# Patient Record
Sex: Male | Born: 1935
Health system: Southern US, Community
[De-identification: ages and names within clinical notes are randomized; demographics above are authoritative.]

## PROBLEM LIST (undated history)

## (undated) DIAGNOSIS — R079 Chest pain, unspecified: Secondary | ICD-10-CM

## (undated) DIAGNOSIS — E785 Hyperlipidemia, unspecified: Secondary | ICD-10-CM

## (undated) DIAGNOSIS — N529 Male erectile dysfunction, unspecified: Secondary | ICD-10-CM

## (undated) DIAGNOSIS — N401 Enlarged prostate with lower urinary tract symptoms: Secondary | ICD-10-CM

## (undated) DIAGNOSIS — E1129 Type 2 diabetes mellitus with other diabetic kidney complication: Secondary | ICD-10-CM

## (undated) DIAGNOSIS — N183 Chronic kidney disease, stage 3 unspecified: Secondary | ICD-10-CM

## (undated) DIAGNOSIS — R972 Elevated prostate specific antigen [PSA]: Secondary | ICD-10-CM

## (undated) DIAGNOSIS — N138 Other obstructive and reflux uropathy: Secondary | ICD-10-CM

## (undated) DIAGNOSIS — N200 Calculus of kidney: Secondary | ICD-10-CM

## (undated) DIAGNOSIS — I10 Essential (primary) hypertension: Secondary | ICD-10-CM

## (undated) DIAGNOSIS — R809 Proteinuria, unspecified: Secondary | ICD-10-CM

## (undated) DIAGNOSIS — H409 Unspecified glaucoma: Secondary | ICD-10-CM

## (undated) DIAGNOSIS — E669 Obesity, unspecified: Secondary | ICD-10-CM

## (undated) DIAGNOSIS — I495 Sick sinus syndrome: Secondary | ICD-10-CM

## (undated) DIAGNOSIS — N400 Enlarged prostate without lower urinary tract symptoms: Secondary | ICD-10-CM

## (undated) DIAGNOSIS — M1990 Unspecified osteoarthritis, unspecified site: Secondary | ICD-10-CM

## (undated) HISTORY — DX: Benign prostatic hyperplasia without lower urinary tract symptoms: N40.0

## (undated) HISTORY — DX: Unspecified glaucoma: H40.9

## (undated) HISTORY — DX: Type 2 diabetes mellitus with other diabetic kidney complication: E11.29

## (undated) HISTORY — DX: Other obstructive and reflux uropathy: N13.8

## (undated) HISTORY — DX: Elevated prostate specific antigen (PSA): R97.20

## (undated) HISTORY — DX: Male erectile dysfunction, unspecified: N52.9

## (undated) HISTORY — DX: Chronic kidney disease, stage 3 (moderate): N18.3

## (undated) HISTORY — PX: KNEE SURGERY: SHX244

## (undated) HISTORY — DX: Hyperlipidemia, unspecified: E78.5

## (undated) HISTORY — DX: Essential (primary) hypertension: I10

## (undated) HISTORY — DX: Proteinuria, unspecified: R80.9

## (undated) HISTORY — DX: Obesity, unspecified: E66.9

## (undated) HISTORY — DX: Unspecified osteoarthritis, unspecified site: M19.90

## (undated) HISTORY — DX: Calculus of kidney: N20.0

## (undated) HISTORY — DX: Benign prostatic hyperplasia with lower urinary tract symptoms: N40.1

## (undated) HISTORY — DX: Sick sinus syndrome: I49.5

## (undated) HISTORY — DX: Chest pain, unspecified: R07.9

## (undated) HISTORY — DX: Chronic kidney disease, stage 3 unspecified: N18.30

---

## 1998-06-24 ENCOUNTER — Other Ambulatory Visit: Admission: RE | Admit: 1998-06-24 | Discharge: 1998-06-24 | Payer: Self-pay | Admitting: Gastroenterology

## 1998-11-27 ENCOUNTER — Other Ambulatory Visit: Admission: RE | Admit: 1998-11-27 | Discharge: 1998-11-27 | Payer: Self-pay | Admitting: Urology

## 1999-09-13 ENCOUNTER — Encounter: Payer: Self-pay | Admitting: *Deleted

## 1999-09-13 ENCOUNTER — Encounter: Admission: RE | Admit: 1999-09-13 | Discharge: 1999-09-13 | Payer: Self-pay | Admitting: *Deleted

## 1999-09-15 ENCOUNTER — Ambulatory Visit (HOSPITAL_BASED_OUTPATIENT_CLINIC_OR_DEPARTMENT_OTHER): Admission: RE | Admit: 1999-09-15 | Discharge: 1999-09-15 | Payer: Self-pay | Admitting: *Deleted

## 2000-01-02 ENCOUNTER — Emergency Department (HOSPITAL_COMMUNITY): Admission: EM | Admit: 2000-01-02 | Discharge: 2000-01-02 | Payer: Self-pay | Admitting: Emergency Medicine

## 2000-01-02 ENCOUNTER — Encounter: Payer: Self-pay | Admitting: *Deleted

## 2000-01-05 ENCOUNTER — Ambulatory Visit (HOSPITAL_COMMUNITY): Admission: RE | Admit: 2000-01-05 | Discharge: 2000-01-05 | Payer: Self-pay | Admitting: *Deleted

## 2000-07-07 ENCOUNTER — Other Ambulatory Visit: Admission: RE | Admit: 2000-07-07 | Discharge: 2000-07-07 | Payer: Self-pay | Admitting: Urology

## 2001-02-21 ENCOUNTER — Encounter: Payer: Self-pay | Admitting: *Deleted

## 2001-02-26 ENCOUNTER — Inpatient Hospital Stay (HOSPITAL_COMMUNITY): Admission: RE | Admit: 2001-02-26 | Discharge: 2001-03-04 | Payer: Self-pay | Admitting: *Deleted

## 2001-02-26 HISTORY — PX: REPLACEMENT TOTAL KNEE: SUR1224

## 2001-03-23 ENCOUNTER — Encounter: Admission: RE | Admit: 2001-03-23 | Discharge: 2001-05-11 | Payer: Self-pay | Admitting: *Deleted

## 2001-08-28 ENCOUNTER — Encounter: Admission: RE | Admit: 2001-08-28 | Discharge: 2001-09-26 | Payer: Self-pay | Admitting: *Deleted

## 2002-12-05 ENCOUNTER — Encounter: Payer: Self-pay | Admitting: Internal Medicine

## 2002-12-05 ENCOUNTER — Encounter: Admission: RE | Admit: 2002-12-05 | Discharge: 2002-12-05 | Payer: Self-pay | Admitting: Internal Medicine

## 2002-12-18 ENCOUNTER — Encounter: Payer: Self-pay | Admitting: Gastroenterology

## 2005-02-05 ENCOUNTER — Emergency Department (HOSPITAL_COMMUNITY): Admission: EM | Admit: 2005-02-05 | Discharge: 2005-02-05 | Payer: Self-pay | Admitting: Emergency Medicine

## 2006-03-19 ENCOUNTER — Emergency Department (HOSPITAL_COMMUNITY): Admission: EM | Admit: 2006-03-19 | Discharge: 2006-03-19 | Payer: Self-pay | Admitting: *Deleted

## 2006-12-22 ENCOUNTER — Ambulatory Visit: Payer: Self-pay | Admitting: Gastroenterology

## 2007-01-08 ENCOUNTER — Ambulatory Visit: Payer: Self-pay | Admitting: Gastroenterology

## 2007-10-23 ENCOUNTER — Ambulatory Visit: Payer: Self-pay | Admitting: Gastroenterology

## 2008-04-18 DIAGNOSIS — K219 Gastro-esophageal reflux disease without esophagitis: Secondary | ICD-10-CM | POA: Insufficient documentation

## 2008-04-18 DIAGNOSIS — I10 Essential (primary) hypertension: Secondary | ICD-10-CM

## 2008-04-18 DIAGNOSIS — Z860100 Personal history of colon polyps, unspecified: Secondary | ICD-10-CM | POA: Insufficient documentation

## 2008-04-18 DIAGNOSIS — E785 Hyperlipidemia, unspecified: Secondary | ICD-10-CM | POA: Insufficient documentation

## 2008-04-18 DIAGNOSIS — K5730 Diverticulosis of large intestine without perforation or abscess without bleeding: Secondary | ICD-10-CM | POA: Insufficient documentation

## 2008-04-18 DIAGNOSIS — K449 Diaphragmatic hernia without obstruction or gangrene: Secondary | ICD-10-CM | POA: Insufficient documentation

## 2008-04-18 DIAGNOSIS — K222 Esophageal obstruction: Secondary | ICD-10-CM

## 2008-04-22 ENCOUNTER — Ambulatory Visit: Payer: Self-pay | Admitting: Gastroenterology

## 2008-04-22 DIAGNOSIS — R1319 Other dysphagia: Secondary | ICD-10-CM

## 2008-04-22 DIAGNOSIS — K625 Hemorrhage of anus and rectum: Secondary | ICD-10-CM

## 2008-04-24 ENCOUNTER — Ambulatory Visit (HOSPITAL_COMMUNITY): Admission: RE | Admit: 2008-04-24 | Discharge: 2008-04-24 | Payer: Self-pay | Admitting: Gastroenterology

## 2008-05-01 ENCOUNTER — Telehealth: Payer: Self-pay | Admitting: Gastroenterology

## 2008-05-22 ENCOUNTER — Ambulatory Visit: Payer: Self-pay | Admitting: Gastroenterology

## 2008-05-22 ENCOUNTER — Ambulatory Visit (HOSPITAL_COMMUNITY): Admission: RE | Admit: 2008-05-22 | Discharge: 2008-05-22 | Payer: Self-pay | Admitting: Gastroenterology

## 2008-06-09 ENCOUNTER — Telehealth: Payer: Self-pay | Admitting: Gastroenterology

## 2009-05-21 ENCOUNTER — Telehealth: Payer: Self-pay | Admitting: Gastroenterology

## 2011-03-15 NOTE — Assessment & Plan Note (Signed)
[facility]                         GASTROENTEROLOGY OFFICE NOTE   NAME:[name], [name] KEMMERLING                   MRN:          [id number]  DATE:10/23/2007                            DOB:          03-20-1925    [name] [name] has had some hard stools after stopping probiotic therapy.  He has had a tear in his rectum partially responsive to local anal  salves.  He has had some intermittent bright red blood per rectum.  Denies abdominal pain what so ever.  He does have a family history of  colon carcinoma in a sibling and also has had previous polyps.  However,  he had a normal colonoscopy in March of 2008 except for diverticulosis.   [name] [name] also has documented esophageal spasm, and has intermittent  dysphagia mostly for liquids.  He denies reflux symptoms.  Does not take  antacids, but uses occasional vinegar.  He is followed by Dr. Vonita Moss  because of recurrent kidney stones, and sees Dr. Wylene Simmer for treatment  of his hypertensive cardiovascular disease.  He has had previous hernia  surgery and subsequent hypercholesterolemia.   MEDICATIONS:  1. Uroxatral 10 mg a day.  2. Hyzaar 20-100 a day.  3. Allopurinol 100 mg a day.  4. Simvastatin 80 mg a day.  5. A variety of multivitamins.   He denies drug allergies.   EXAMINATION:  He is a healthy-appearing white male appearing younger  than his stated age.  He is 5 feet 9 inches and weighs 138 pounds.  Blood pressure 132/80 and  pulse was 68 and regular.  ABDOMEN:  Obese, but I could appreciate no definite organomegaly,  masses, or tenderness.  Inspection of the rectum also was unremarkable.  I could not appreciate  a definite fissure.  Rectal exam showed no masses or tenderness and his  rectum was not particularly tender.  Soft stool present.  It was trace  guaiac positive.   ASSESSMENT:  1. I think [name] [name] most likely has a healing anal fissure.  As      mentioned above, he has had recent  colonoscopy that was      unremarkable.  2. Recurrent esophageal spasm without associated weight loss or other      systemic complaints.  3. Hypertensive cardiovascular disease.  4. History of hyperuricemia, recurrent uric acid stones.   RECOMMENDATIONS:  1. High fiber diet with daily Benefiber and liberal p.o. fluids.  2. Canasa 1 g suppositories at bedtime with twice a day Analpram      cream.  3. Continue other medications as per Dr. Wylene Simmer.     Vania Rea. Jarold Motto, MD, Caleen Essex, FAGA  Electronically Signed    DRP/MedQ  DD: 10/23/2007  DT: 10/23/2007  Job #: [id number]

## 2011-03-18 NOTE — Op Note (Signed)
[place]. [facility]  Patient:    [name] [name], [name] [name]                   MRN: [id number] Proc. Date: 02/26/01 Adm. Date:  [id number] Attending:  Maryanna Shape                           Operative Report  PREOPERATIVE DIAGNOSIS:  Left knee osteoarthritis.  POSTOPERATIVE DIAGNOSIS:  Left knee osteoarthritis.  OPERATIVE PROCEDURE:  Left total knee arthroplasty.  ANESTHESIA:  General endotracheal.  SURGEON:  Reynolds Bowl, M.D.  ASSISTANT:  Dorene Grebe, M.D.  DESCRIPTION OF PROCEDURE:  The patient was given a general anesthetic. Vancomycin 750 mg was given IV. A Foley catheter was put in place and with the patient in the supine position, the groin area was isolated with a U-drape and and a proximal pneumatic tourniquet placed. It was then prepped with DuraPrep and draped in the usual manner. This included the use of two Ioban drapes. The extremity was exsanguinated with an Esmarch bandage and elevation of the proximal pneumatic elevated to 350 mmHg.  A straight midline incision was made starting proximally and carried to the mid patella and gradually carried over to the medial side of the patellar tendon. The retinaculum was opened medially. The patella dislocated laterally. The medial side of the knee was exposed subperiosteally back to the back corner. The lateral side of the knee was exposed to show its edges by removing a small amount of the capsule. The distal femur was resected 12 mm and 5 degrees of valgus using an intramedullary guide. The distal femur was shaped using the chamfer cut guide and then the knee prepared for the Scorpio box using the cutting block and impactor. Using the sizing guide, we had decided on size 9 and used that size for our cutting blocks.  Attention was directed to the proximal tibia which was resected 10 mm from the lateral side using an intramedullary guide. At this point trial femoral/tibial components and tibial  tray bearing, 10 mm thick were tried. We decided on, as noted above, a size 9 femur, size 9 tibial tray and 10 mm plastic tibial tray. This allowed full flexion, full extension. We did a little bit of release posteriorly and it tracked well. We then marked the rotation and then used power guide to impact and make the bed for the tibial tray thin.  Attention was then directed to the patella which was resected 10 mm and then I placed peg holes to utilize a 7 dome patella. At this point we then checked on our overall alignment again using our alignment rods and felt that was good. Then with the components in place, flexion and extension and varus/valgus stress, felt that we had excellent tibial and femoral fit, but the lateral retinaculum was a little tight. The patella would seat, but would tilt. Therefore, we accomplished a lateral release and then it would flex and extend and not tilt. Following this, the entire area was copiously irrigated with pulsatile lavage. We then cemented the tibial component, followed by the femoral component and then cemented the patellar button. Each time each element was well seated and excess cement removed. Having done this, I put the tibial bearing tray in place and we waited until all the cement had hardened.  Having accomplished this, the area was irrigated, the proximal pneumatic tourniquet was let down. There were a few bleeders  which were Bovie cauterized and the closed the retinaculum using figure-of-eight sutures of #1 Vicryl, closing the more superficial tissues using 2-0 Vicryl and the skin edges with metal staples. One suction Hemovac was placed just superficial to the retinaculum. The skin was dressed with Xeroform, 4 x 4s, ABDs, Kerlix, light application of Ace wrap and placed in a knee immobilizer. Following this, the anesthesiologist did a femoral and lateral femoral cutaneous nerve block. The estimated blood loss during this procedure was  about 250 cc. Intermittently, the joint was irrigated with pulsatile lavage. There were no apparent unresolved problems. DD:  02/26/01 TD:  02/26/01 Job: [id number]

## 2011-03-18 NOTE — H&P (Signed)
[place]. [facility]  Patient:    [name] [name], [name] [name]                     MRN: [id number] Adm. Date:  02/21/01 Attending:  Reynolds Bowl, M.D.                         History and Physical  HISTORY OF PRESENT ILLNESS:  Mr. Isadore is a 77 year old man with complaint of continuous pain in the left knee getting so bad at times he can hardly walk.  I did arthroscopy on this same knee in November of 2000.  He had general arthrosis, more medial than lateral.  Subsequently the patient had been on glucosamine.  He underwent a series of _________ injections and by December of 2001, following the _________ injections, he felt almost normal.  Since then, he has gradually developed increasing pain.  He was evaluated as an outpatient and found to have significant x-ray changes, surgery, multiple complications involving limited motion, pulmonary emboli, pain, infection at the time were all discussed, and the patient elected for surgery.  PAST HISTORY:  The patient has had surgery for kidney stones in the 1970s, colonoscopy with removal of polyps in 2000, herniorrhaphy many years ago.  FAMILY HISTORY:  Positive for breast and colon cancer, as well as high blood pressure.  SOCIAL HISTORY:  The patient does not smoke cigarettes or drink ethanol.  ALLERGIES:  PENICILLIN.  He did have prior surgery and a rash under his dressing with prolonged exposure to DURAPREP.  I do not think this was an allergy.  MEDICATIONS: 1. Iron and vitamins. 2. Zocor 80 mg one fourth tablet daily. 3. Hyzaar 100-25 one daily.  PHYSICAL EXAMINATION:  VITAL SIGNS:  He is 5 feet 9 inches, 232 pounds, temperature is 97.1, blood pressure 124/82, pulse 64, respirations 12.  HEENT:  He is wearing corrective lenses.  Extraocular movements full.  Ears: Tympanic membranes are intact.  Pharynx is clear.  Teeth are well-repaired.  NECK:  No palpable cervical mass and no carotid bruits.  CHEST:   Good expansion.  Breath sounds clear.  HEART:  Regular rhythm, no murmurs heard.  ABDOMEN:  Round, soft, nontender, no palpable masses.  Bowel sounds are normal.  There is diastasis recti.  EXTREMITIES:  For the left knee, he has good femoral pulse without bruit.  He has good dorsalis pedis pulse.  He does not have leg edema.  His motion in the left knee is from 15-110 degrees.  He is stable.  He is generally sore, mildly crepitant.  X-rays compatible with tricompartment osteoarthritis.  ADMITTING DIAGNOSES: 1. Osteoarthritis of the left knee. 2. Hypertension. 3. Overweight.  PLAN:  Left total knee arthroplasty as fully discussed with the patient and his wife.  They seem to understand and agree. DD:  02/21/01 TD:  02/21/01 Job: [id number]

## 2011-03-18 NOTE — Discharge Summary (Signed)
[place]. [facility]  Patient:    [name] [name], [name] [name]                   MRN: [id number] Adm. Date:  [id number] Disc. Date: [id number] Attending:  Maryanna Shape                           Discharge Summary  ADMISSION DIAGNOSES: 1. Osteoarthritis, left knee. 2. Hypertension. 3. Overweight.  DISCHARGE DIAGNOSES: 1. Osteoarthritis, left knee. 2. Hypertension. 3. Overweight.  PROCEDURE:  February 26, 2001, left total knee arthroplasty, size #9 Scorpio PS Femoral component, #9 tibial tray, 10 mm #9 tibial bearing insert, #7 Scorpio Dome patella.  HISTORY OF PRESENT ILLNESS:  See that dictated on admission.  HOSPITAL COURSE:  On the day of admission, he was taken to the operating room where he underwent total knee arthroplasty on the left knee as detailed in the operative note.  He was then preoperatively and postoperatively antibiotics. He was begun on Coumadin early postoperatively and he was begun on mobilization early.  On April 30, nasal O2 was discharged.  Wound drain was removed.  Foley catheter was removed.  His dressing was changed and his IV was taken to [facility].  He was then begun on approximately 30% weightbearing.  He progressed well without any apparent problems and was discharged on Mar 04, 2001, to continue prophylactic Coumadin a total of four weeks.  He was to have home physical therapy.  He was to weightbear approximately 30%, do frequent quad sets and ankle pumps and on that day every other staple was removed.  He was to see me in the office in approximately a week or call sooner if concerns.  Some of the laboratory data obtained during this hospitalization, April 24, EKG normal sinus rhythm with short PR.  Otherwise normal EKG.  The patients admission hemoglobin on April 24 was 16 and his last hemoglobin was 10.5.  His INR on discharge was 1.8.  His electrolytes and routine chemistries were normal.  Admission urinalysis was normal. DD:   03/15/01 TD:  03/15/01 Job: [id number]

## 2011-11-01 DIAGNOSIS — R809 Proteinuria, unspecified: Secondary | ICD-10-CM

## 2011-11-01 HISTORY — DX: Proteinuria, unspecified: R80.9

## 2012-01-10 DIAGNOSIS — E785 Hyperlipidemia, unspecified: Secondary | ICD-10-CM | POA: Diagnosis not present

## 2012-01-10 DIAGNOSIS — I10 Essential (primary) hypertension: Secondary | ICD-10-CM | POA: Diagnosis not present

## 2012-01-10 DIAGNOSIS — E119 Type 2 diabetes mellitus without complications: Secondary | ICD-10-CM | POA: Diagnosis not present

## 2012-01-10 DIAGNOSIS — I495 Sick sinus syndrome: Secondary | ICD-10-CM | POA: Diagnosis not present

## 2012-01-17 DIAGNOSIS — H4011X0 Primary open-angle glaucoma, stage unspecified: Secondary | ICD-10-CM | POA: Diagnosis not present

## 2012-02-13 DIAGNOSIS — E119 Type 2 diabetes mellitus without complications: Secondary | ICD-10-CM | POA: Diagnosis not present

## 2012-02-13 DIAGNOSIS — E785 Hyperlipidemia, unspecified: Secondary | ICD-10-CM | POA: Diagnosis not present

## 2012-02-13 DIAGNOSIS — I10 Essential (primary) hypertension: Secondary | ICD-10-CM | POA: Diagnosis not present

## 2012-02-13 DIAGNOSIS — R0989 Other specified symptoms and signs involving the circulatory and respiratory systems: Secondary | ICD-10-CM | POA: Diagnosis not present

## 2012-02-22 DIAGNOSIS — E119 Type 2 diabetes mellitus without complications: Secondary | ICD-10-CM | POA: Diagnosis not present

## 2012-02-22 DIAGNOSIS — I495 Sick sinus syndrome: Secondary | ICD-10-CM | POA: Diagnosis not present

## 2012-02-22 DIAGNOSIS — R0989 Other specified symptoms and signs involving the circulatory and respiratory systems: Secondary | ICD-10-CM | POA: Diagnosis not present

## 2012-02-22 DIAGNOSIS — R0609 Other forms of dyspnea: Secondary | ICD-10-CM | POA: Diagnosis not present

## 2012-02-22 DIAGNOSIS — R9431 Abnormal electrocardiogram [ECG] [EKG]: Secondary | ICD-10-CM | POA: Diagnosis not present

## 2012-02-29 DIAGNOSIS — R0602 Shortness of breath: Secondary | ICD-10-CM | POA: Diagnosis not present

## 2012-02-29 DIAGNOSIS — R072 Precordial pain: Secondary | ICD-10-CM | POA: Diagnosis not present

## 2012-02-29 DIAGNOSIS — E119 Type 2 diabetes mellitus without complications: Secondary | ICD-10-CM | POA: Diagnosis not present

## 2012-02-29 DIAGNOSIS — I119 Hypertensive heart disease without heart failure: Secondary | ICD-10-CM | POA: Diagnosis not present

## 2012-03-02 DIAGNOSIS — I119 Hypertensive heart disease without heart failure: Secondary | ICD-10-CM | POA: Diagnosis not present

## 2012-03-02 DIAGNOSIS — R0602 Shortness of breath: Secondary | ICD-10-CM | POA: Diagnosis not present

## 2012-03-02 DIAGNOSIS — R079 Chest pain, unspecified: Secondary | ICD-10-CM | POA: Diagnosis not present

## 2012-03-02 DIAGNOSIS — E119 Type 2 diabetes mellitus without complications: Secondary | ICD-10-CM | POA: Diagnosis not present

## 2012-04-17 DIAGNOSIS — H4011X0 Primary open-angle glaucoma, stage unspecified: Secondary | ICD-10-CM | POA: Diagnosis not present

## 2012-05-22 DIAGNOSIS — E119 Type 2 diabetes mellitus without complications: Secondary | ICD-10-CM | POA: Diagnosis not present

## 2012-05-22 DIAGNOSIS — I10 Essential (primary) hypertension: Secondary | ICD-10-CM | POA: Diagnosis not present

## 2012-05-22 DIAGNOSIS — Z125 Encounter for screening for malignant neoplasm of prostate: Secondary | ICD-10-CM | POA: Diagnosis not present

## 2012-05-22 DIAGNOSIS — E785 Hyperlipidemia, unspecified: Secondary | ICD-10-CM | POA: Diagnosis not present

## 2012-05-29 DIAGNOSIS — R809 Proteinuria, unspecified: Secondary | ICD-10-CM | POA: Diagnosis not present

## 2012-05-29 DIAGNOSIS — E785 Hyperlipidemia, unspecified: Secondary | ICD-10-CM | POA: Diagnosis not present

## 2012-05-29 DIAGNOSIS — I10 Essential (primary) hypertension: Secondary | ICD-10-CM | POA: Diagnosis not present

## 2012-05-29 DIAGNOSIS — Z0000 Encounter for general adult medical examination without abnormal findings: Secondary | ICD-10-CM | POA: Diagnosis not present

## 2012-05-29 DIAGNOSIS — Z125 Encounter for screening for malignant neoplasm of prostate: Secondary | ICD-10-CM | POA: Diagnosis not present

## 2012-05-29 DIAGNOSIS — E1129 Type 2 diabetes mellitus with other diabetic kidney complication: Secondary | ICD-10-CM | POA: Diagnosis not present

## 2012-05-31 DIAGNOSIS — Z1212 Encounter for screening for malignant neoplasm of rectum: Secondary | ICD-10-CM | POA: Diagnosis not present

## 2012-07-11 DIAGNOSIS — Z87442 Personal history of urinary calculi: Secondary | ICD-10-CM | POA: Diagnosis not present

## 2012-07-11 DIAGNOSIS — N401 Enlarged prostate with lower urinary tract symptoms: Secondary | ICD-10-CM | POA: Diagnosis not present

## 2012-07-17 DIAGNOSIS — H4011X0 Primary open-angle glaucoma, stage unspecified: Secondary | ICD-10-CM | POA: Diagnosis not present

## 2012-07-24 DIAGNOSIS — Z23 Encounter for immunization: Secondary | ICD-10-CM | POA: Diagnosis not present

## 2012-08-10 DIAGNOSIS — K1329 Other disturbances of oral epithelium, including tongue: Secondary | ICD-10-CM | POA: Diagnosis not present

## 2012-09-07 ENCOUNTER — Encounter: Payer: Self-pay | Admitting: Gastroenterology

## 2012-11-15 DIAGNOSIS — H4011X0 Primary open-angle glaucoma, stage unspecified: Secondary | ICD-10-CM | POA: Diagnosis not present

## 2012-11-29 DIAGNOSIS — E785 Hyperlipidemia, unspecified: Secondary | ICD-10-CM | POA: Diagnosis not present

## 2012-11-29 DIAGNOSIS — I10 Essential (primary) hypertension: Secondary | ICD-10-CM | POA: Diagnosis not present

## 2013-01-10 DIAGNOSIS — M1990 Unspecified osteoarthritis, unspecified site: Secondary | ICD-10-CM | POA: Diagnosis not present

## 2013-01-10 DIAGNOSIS — E1129 Type 2 diabetes mellitus with other diabetic kidney complication: Secondary | ICD-10-CM | POA: Diagnosis not present

## 2013-01-10 DIAGNOSIS — N200 Calculus of kidney: Secondary | ICD-10-CM | POA: Diagnosis not present

## 2013-01-10 DIAGNOSIS — R809 Proteinuria, unspecified: Secondary | ICD-10-CM | POA: Diagnosis not present

## 2013-04-01 DIAGNOSIS — H4011X0 Primary open-angle glaucoma, stage unspecified: Secondary | ICD-10-CM | POA: Diagnosis not present

## 2013-04-17 DIAGNOSIS — Z1331 Encounter for screening for depression: Secondary | ICD-10-CM | POA: Diagnosis not present

## 2013-04-17 DIAGNOSIS — E785 Hyperlipidemia, unspecified: Secondary | ICD-10-CM | POA: Diagnosis not present

## 2013-04-17 DIAGNOSIS — N529 Male erectile dysfunction, unspecified: Secondary | ICD-10-CM | POA: Diagnosis not present

## 2013-04-17 DIAGNOSIS — I495 Sick sinus syndrome: Secondary | ICD-10-CM | POA: Diagnosis not present

## 2013-04-17 DIAGNOSIS — E1129 Type 2 diabetes mellitus with other diabetic kidney complication: Secondary | ICD-10-CM | POA: Diagnosis not present

## 2013-04-17 DIAGNOSIS — Z79899 Other long term (current) drug therapy: Secondary | ICD-10-CM | POA: Diagnosis not present

## 2013-04-17 DIAGNOSIS — I10 Essential (primary) hypertension: Secondary | ICD-10-CM | POA: Diagnosis not present

## 2013-04-17 DIAGNOSIS — N401 Enlarged prostate with lower urinary tract symptoms: Secondary | ICD-10-CM | POA: Diagnosis not present

## 2013-04-17 DIAGNOSIS — R809 Proteinuria, unspecified: Secondary | ICD-10-CM | POA: Diagnosis not present

## 2013-05-23 DIAGNOSIS — M20019 Mallet finger of unspecified finger(s): Secondary | ICD-10-CM | POA: Diagnosis not present

## 2013-07-17 DIAGNOSIS — I10 Essential (primary) hypertension: Secondary | ICD-10-CM | POA: Diagnosis not present

## 2013-07-17 DIAGNOSIS — E785 Hyperlipidemia, unspecified: Secondary | ICD-10-CM | POA: Diagnosis not present

## 2013-07-17 DIAGNOSIS — E1129 Type 2 diabetes mellitus with other diabetic kidney complication: Secondary | ICD-10-CM | POA: Diagnosis not present

## 2013-07-17 DIAGNOSIS — Z125 Encounter for screening for malignant neoplasm of prostate: Secondary | ICD-10-CM | POA: Diagnosis not present

## 2013-07-17 DIAGNOSIS — M20019 Mallet finger of unspecified finger(s): Secondary | ICD-10-CM | POA: Diagnosis not present

## 2013-07-22 DIAGNOSIS — E669 Obesity, unspecified: Secondary | ICD-10-CM | POA: Diagnosis not present

## 2013-07-22 DIAGNOSIS — N401 Enlarged prostate with lower urinary tract symptoms: Secondary | ICD-10-CM | POA: Diagnosis not present

## 2013-07-22 DIAGNOSIS — I495 Sick sinus syndrome: Secondary | ICD-10-CM | POA: Diagnosis not present

## 2013-07-22 DIAGNOSIS — E1129 Type 2 diabetes mellitus with other diabetic kidney complication: Secondary | ICD-10-CM | POA: Diagnosis not present

## 2013-07-22 DIAGNOSIS — E785 Hyperlipidemia, unspecified: Secondary | ICD-10-CM | POA: Diagnosis not present

## 2013-07-22 DIAGNOSIS — I10 Essential (primary) hypertension: Secondary | ICD-10-CM | POA: Diagnosis not present

## 2013-07-22 DIAGNOSIS — Z23 Encounter for immunization: Secondary | ICD-10-CM | POA: Diagnosis not present

## 2013-07-22 DIAGNOSIS — Z125 Encounter for screening for malignant neoplasm of prostate: Secondary | ICD-10-CM | POA: Diagnosis not present

## 2013-07-22 DIAGNOSIS — Z0000 Encounter for general adult medical examination without abnormal findings: Secondary | ICD-10-CM | POA: Diagnosis not present

## 2013-07-22 DIAGNOSIS — Z79899 Other long term (current) drug therapy: Secondary | ICD-10-CM | POA: Diagnosis not present

## 2013-07-23 DIAGNOSIS — Z1212 Encounter for screening for malignant neoplasm of rectum: Secondary | ICD-10-CM | POA: Diagnosis not present

## 2013-07-24 DIAGNOSIS — N401 Enlarged prostate with lower urinary tract symptoms: Secondary | ICD-10-CM | POA: Diagnosis not present

## 2013-07-24 DIAGNOSIS — Z87442 Personal history of urinary calculi: Secondary | ICD-10-CM | POA: Diagnosis not present

## 2013-08-05 DIAGNOSIS — H4011X0 Primary open-angle glaucoma, stage unspecified: Secondary | ICD-10-CM | POA: Diagnosis not present

## 2013-08-05 DIAGNOSIS — E119 Type 2 diabetes mellitus without complications: Secondary | ICD-10-CM | POA: Diagnosis not present

## 2013-11-11 DIAGNOSIS — H4011X0 Primary open-angle glaucoma, stage unspecified: Secondary | ICD-10-CM | POA: Diagnosis not present

## 2014-01-16 DIAGNOSIS — I10 Essential (primary) hypertension: Secondary | ICD-10-CM | POA: Diagnosis not present

## 2014-01-16 DIAGNOSIS — R809 Proteinuria, unspecified: Secondary | ICD-10-CM | POA: Diagnosis not present

## 2014-01-16 DIAGNOSIS — E669 Obesity, unspecified: Secondary | ICD-10-CM | POA: Diagnosis not present

## 2014-01-16 DIAGNOSIS — E785 Hyperlipidemia, unspecified: Secondary | ICD-10-CM | POA: Diagnosis not present

## 2014-01-16 DIAGNOSIS — E1165 Type 2 diabetes mellitus with hyperglycemia: Secondary | ICD-10-CM | POA: Diagnosis not present

## 2014-01-16 DIAGNOSIS — N138 Other obstructive and reflux uropathy: Secondary | ICD-10-CM | POA: Diagnosis not present

## 2014-01-16 DIAGNOSIS — N401 Enlarged prostate with lower urinary tract symptoms: Secondary | ICD-10-CM | POA: Diagnosis not present

## 2014-01-16 DIAGNOSIS — N529 Male erectile dysfunction, unspecified: Secondary | ICD-10-CM | POA: Diagnosis not present

## 2014-01-16 DIAGNOSIS — Z79899 Other long term (current) drug therapy: Secondary | ICD-10-CM | POA: Diagnosis not present

## 2014-01-16 DIAGNOSIS — E1129 Type 2 diabetes mellitus with other diabetic kidney complication: Secondary | ICD-10-CM | POA: Diagnosis not present

## 2014-02-05 DIAGNOSIS — E1129 Type 2 diabetes mellitus with other diabetic kidney complication: Secondary | ICD-10-CM | POA: Diagnosis not present

## 2014-02-05 DIAGNOSIS — Z6831 Body mass index (BMI) 31.0-31.9, adult: Secondary | ICD-10-CM | POA: Diagnosis not present

## 2014-03-03 DIAGNOSIS — H4011X0 Primary open-angle glaucoma, stage unspecified: Secondary | ICD-10-CM | POA: Diagnosis not present

## 2014-04-17 ENCOUNTER — Encounter: Payer: Self-pay | Admitting: Gastroenterology

## 2014-05-12 DIAGNOSIS — N183 Chronic kidney disease, stage 3 unspecified: Secondary | ICD-10-CM | POA: Diagnosis not present

## 2014-05-12 DIAGNOSIS — I10 Essential (primary) hypertension: Secondary | ICD-10-CM | POA: Diagnosis not present

## 2014-05-12 DIAGNOSIS — N529 Male erectile dysfunction, unspecified: Secondary | ICD-10-CM | POA: Diagnosis not present

## 2014-05-12 DIAGNOSIS — I495 Sick sinus syndrome: Secondary | ICD-10-CM | POA: Diagnosis not present

## 2014-05-12 DIAGNOSIS — Z79899 Other long term (current) drug therapy: Secondary | ICD-10-CM | POA: Diagnosis not present

## 2014-05-12 DIAGNOSIS — E785 Hyperlipidemia, unspecified: Secondary | ICD-10-CM | POA: Diagnosis not present

## 2014-05-12 DIAGNOSIS — E1129 Type 2 diabetes mellitus with other diabetic kidney complication: Secondary | ICD-10-CM | POA: Diagnosis not present

## 2014-05-12 DIAGNOSIS — N401 Enlarged prostate with lower urinary tract symptoms: Secondary | ICD-10-CM | POA: Diagnosis not present

## 2014-05-21 DIAGNOSIS — Z87442 Personal history of urinary calculi: Secondary | ICD-10-CM | POA: Diagnosis not present

## 2014-05-21 DIAGNOSIS — N401 Enlarged prostate with lower urinary tract symptoms: Secondary | ICD-10-CM | POA: Diagnosis not present

## 2014-07-14 DIAGNOSIS — H4011X0 Primary open-angle glaucoma, stage unspecified: Secondary | ICD-10-CM | POA: Diagnosis not present

## 2014-07-24 DIAGNOSIS — E1165 Type 2 diabetes mellitus with hyperglycemia: Secondary | ICD-10-CM | POA: Diagnosis not present

## 2014-07-24 DIAGNOSIS — I10 Essential (primary) hypertension: Secondary | ICD-10-CM | POA: Diagnosis not present

## 2014-07-24 DIAGNOSIS — Z125 Encounter for screening for malignant neoplasm of prostate: Secondary | ICD-10-CM | POA: Diagnosis not present

## 2014-07-24 DIAGNOSIS — E785 Hyperlipidemia, unspecified: Secondary | ICD-10-CM | POA: Diagnosis not present

## 2014-07-24 DIAGNOSIS — E1129 Type 2 diabetes mellitus with other diabetic kidney complication: Secondary | ICD-10-CM | POA: Diagnosis not present

## 2014-07-31 DIAGNOSIS — Z1389 Encounter for screening for other disorder: Secondary | ICD-10-CM | POA: Diagnosis not present

## 2014-07-31 DIAGNOSIS — E785 Hyperlipidemia, unspecified: Secondary | ICD-10-CM | POA: Diagnosis not present

## 2014-07-31 DIAGNOSIS — N183 Chronic kidney disease, stage 3 (moderate): Secondary | ICD-10-CM | POA: Diagnosis not present

## 2014-07-31 DIAGNOSIS — E669 Obesity, unspecified: Secondary | ICD-10-CM | POA: Diagnosis not present

## 2014-07-31 DIAGNOSIS — I10 Essential (primary) hypertension: Secondary | ICD-10-CM | POA: Diagnosis not present

## 2014-07-31 DIAGNOSIS — Z23 Encounter for immunization: Secondary | ICD-10-CM | POA: Diagnosis not present

## 2014-07-31 DIAGNOSIS — E1159 Type 2 diabetes mellitus with other circulatory complications: Secondary | ICD-10-CM | POA: Diagnosis not present

## 2014-07-31 DIAGNOSIS — M1990 Unspecified osteoarthritis, unspecified site: Secondary | ICD-10-CM | POA: Diagnosis not present

## 2014-07-31 DIAGNOSIS — N200 Calculus of kidney: Secondary | ICD-10-CM | POA: Diagnosis not present

## 2014-07-31 DIAGNOSIS — Z008 Encounter for other general examination: Secondary | ICD-10-CM | POA: Diagnosis not present

## 2014-08-05 DIAGNOSIS — Z1212 Encounter for screening for malignant neoplasm of rectum: Secondary | ICD-10-CM | POA: Diagnosis not present

## 2014-11-10 DIAGNOSIS — H18413 Arcus senilis, bilateral: Secondary | ICD-10-CM | POA: Diagnosis not present

## 2014-11-10 DIAGNOSIS — E119 Type 2 diabetes mellitus without complications: Secondary | ICD-10-CM | POA: Diagnosis not present

## 2014-11-10 DIAGNOSIS — H40013 Open angle with borderline findings, low risk, bilateral: Secondary | ICD-10-CM | POA: Diagnosis not present

## 2014-11-10 DIAGNOSIS — H4011X1 Primary open-angle glaucoma, mild stage: Secondary | ICD-10-CM | POA: Diagnosis not present

## 2014-11-24 DIAGNOSIS — R350 Frequency of micturition: Secondary | ICD-10-CM | POA: Diagnosis not present

## 2014-11-24 DIAGNOSIS — R3915 Urgency of urination: Secondary | ICD-10-CM | POA: Diagnosis not present

## 2014-11-24 DIAGNOSIS — N401 Enlarged prostate with lower urinary tract symptoms: Secondary | ICD-10-CM | POA: Diagnosis not present

## 2015-01-28 DIAGNOSIS — E785 Hyperlipidemia, unspecified: Secondary | ICD-10-CM | POA: Diagnosis not present

## 2015-01-28 DIAGNOSIS — E669 Obesity, unspecified: Secondary | ICD-10-CM | POA: Diagnosis not present

## 2015-01-28 DIAGNOSIS — N529 Male erectile dysfunction, unspecified: Secondary | ICD-10-CM | POA: Diagnosis not present

## 2015-01-28 DIAGNOSIS — N200 Calculus of kidney: Secondary | ICD-10-CM | POA: Diagnosis not present

## 2015-01-28 DIAGNOSIS — H409 Unspecified glaucoma: Secondary | ICD-10-CM | POA: Diagnosis not present

## 2015-01-28 DIAGNOSIS — E1129 Type 2 diabetes mellitus with other diabetic kidney complication: Secondary | ICD-10-CM | POA: Diagnosis not present

## 2015-01-28 DIAGNOSIS — R809 Proteinuria, unspecified: Secondary | ICD-10-CM | POA: Diagnosis not present

## 2015-01-28 DIAGNOSIS — N183 Chronic kidney disease, stage 3 (moderate): Secondary | ICD-10-CM | POA: Diagnosis not present

## 2015-01-28 DIAGNOSIS — N401 Enlarged prostate with lower urinary tract symptoms: Secondary | ICD-10-CM | POA: Diagnosis not present

## 2015-01-28 DIAGNOSIS — Z1389 Encounter for screening for other disorder: Secondary | ICD-10-CM | POA: Diagnosis not present

## 2015-01-28 DIAGNOSIS — I10 Essential (primary) hypertension: Secondary | ICD-10-CM | POA: Diagnosis not present

## 2015-01-28 DIAGNOSIS — Z6831 Body mass index (BMI) 31.0-31.9, adult: Secondary | ICD-10-CM | POA: Diagnosis not present

## 2015-03-09 DIAGNOSIS — H4011X2 Primary open-angle glaucoma, moderate stage: Secondary | ICD-10-CM | POA: Diagnosis not present

## 2015-04-30 DIAGNOSIS — R809 Proteinuria, unspecified: Secondary | ICD-10-CM | POA: Diagnosis not present

## 2015-04-30 DIAGNOSIS — N183 Chronic kidney disease, stage 3 (moderate): Secondary | ICD-10-CM | POA: Diagnosis not present

## 2015-04-30 DIAGNOSIS — N529 Male erectile dysfunction, unspecified: Secondary | ICD-10-CM | POA: Diagnosis not present

## 2015-04-30 DIAGNOSIS — E1129 Type 2 diabetes mellitus with other diabetic kidney complication: Secondary | ICD-10-CM | POA: Diagnosis not present

## 2015-04-30 DIAGNOSIS — E669 Obesity, unspecified: Secondary | ICD-10-CM | POA: Diagnosis not present

## 2015-04-30 DIAGNOSIS — Z6832 Body mass index (BMI) 32.0-32.9, adult: Secondary | ICD-10-CM | POA: Diagnosis not present

## 2015-04-30 DIAGNOSIS — N401 Enlarged prostate with lower urinary tract symptoms: Secondary | ICD-10-CM | POA: Diagnosis not present

## 2015-04-30 DIAGNOSIS — I10 Essential (primary) hypertension: Secondary | ICD-10-CM | POA: Diagnosis not present

## 2015-04-30 DIAGNOSIS — E785 Hyperlipidemia, unspecified: Secondary | ICD-10-CM | POA: Diagnosis not present

## 2015-04-30 DIAGNOSIS — Z1389 Encounter for screening for other disorder: Secondary | ICD-10-CM | POA: Diagnosis not present

## 2015-07-13 DIAGNOSIS — H4011X1 Primary open-angle glaucoma, mild stage: Secondary | ICD-10-CM | POA: Diagnosis not present

## 2015-07-30 DIAGNOSIS — E1129 Type 2 diabetes mellitus with other diabetic kidney complication: Secondary | ICD-10-CM | POA: Diagnosis not present

## 2015-07-30 DIAGNOSIS — Z125 Encounter for screening for malignant neoplasm of prostate: Secondary | ICD-10-CM | POA: Diagnosis not present

## 2015-07-30 DIAGNOSIS — E785 Hyperlipidemia, unspecified: Secondary | ICD-10-CM | POA: Diagnosis not present

## 2015-08-06 DIAGNOSIS — Z0000 Encounter for general adult medical examination without abnormal findings: Secondary | ICD-10-CM | POA: Diagnosis not present

## 2015-08-06 DIAGNOSIS — Z1389 Encounter for screening for other disorder: Secondary | ICD-10-CM | POA: Diagnosis not present

## 2015-08-06 DIAGNOSIS — I495 Sick sinus syndrome: Secondary | ICD-10-CM | POA: Diagnosis not present

## 2015-08-06 DIAGNOSIS — N183 Chronic kidney disease, stage 3 (moderate): Secondary | ICD-10-CM | POA: Diagnosis not present

## 2015-08-06 DIAGNOSIS — R0789 Other chest pain: Secondary | ICD-10-CM | POA: Diagnosis not present

## 2015-08-06 DIAGNOSIS — N200 Calculus of kidney: Secondary | ICD-10-CM | POA: Diagnosis not present

## 2015-08-06 DIAGNOSIS — N529 Male erectile dysfunction, unspecified: Secondary | ICD-10-CM | POA: Diagnosis not present

## 2015-08-06 DIAGNOSIS — N401 Enlarged prostate with lower urinary tract symptoms: Secondary | ICD-10-CM | POA: Diagnosis not present

## 2015-08-06 DIAGNOSIS — M1990 Unspecified osteoarthritis, unspecified site: Secondary | ICD-10-CM | POA: Diagnosis not present

## 2015-08-06 DIAGNOSIS — Z6832 Body mass index (BMI) 32.0-32.9, adult: Secondary | ICD-10-CM | POA: Diagnosis not present

## 2015-08-06 DIAGNOSIS — R809 Proteinuria, unspecified: Secondary | ICD-10-CM | POA: Diagnosis not present

## 2015-08-06 DIAGNOSIS — E669 Obesity, unspecified: Secondary | ICD-10-CM | POA: Diagnosis not present

## 2015-09-14 DIAGNOSIS — E1129 Type 2 diabetes mellitus with other diabetic kidney complication: Secondary | ICD-10-CM | POA: Insufficient documentation

## 2015-09-14 DIAGNOSIS — N200 Calculus of kidney: Secondary | ICD-10-CM | POA: Insufficient documentation

## 2015-09-14 DIAGNOSIS — N401 Enlarged prostate with lower urinary tract symptoms: Secondary | ICD-10-CM

## 2015-09-14 DIAGNOSIS — N183 Chronic kidney disease, stage 3 unspecified: Secondary | ICD-10-CM | POA: Insufficient documentation

## 2015-09-14 DIAGNOSIS — E785 Hyperlipidemia, unspecified: Secondary | ICD-10-CM | POA: Insufficient documentation

## 2015-09-14 DIAGNOSIS — I495 Sick sinus syndrome: Secondary | ICD-10-CM | POA: Insufficient documentation

## 2015-09-14 DIAGNOSIS — N138 Other obstructive and reflux uropathy: Secondary | ICD-10-CM | POA: Insufficient documentation

## 2015-09-14 DIAGNOSIS — R972 Elevated prostate specific antigen [PSA]: Secondary | ICD-10-CM

## 2015-09-14 DIAGNOSIS — R809 Proteinuria, unspecified: Secondary | ICD-10-CM | POA: Insufficient documentation

## 2015-09-14 DIAGNOSIS — N529 Male erectile dysfunction, unspecified: Secondary | ICD-10-CM | POA: Insufficient documentation

## 2015-09-14 DIAGNOSIS — E669 Obesity, unspecified: Secondary | ICD-10-CM | POA: Insufficient documentation

## 2015-09-14 DIAGNOSIS — N400 Enlarged prostate without lower urinary tract symptoms: Secondary | ICD-10-CM | POA: Insufficient documentation

## 2015-09-14 DIAGNOSIS — R079 Chest pain, unspecified: Secondary | ICD-10-CM | POA: Insufficient documentation

## 2015-09-14 DIAGNOSIS — I10 Essential (primary) hypertension: Secondary | ICD-10-CM | POA: Insufficient documentation

## 2015-09-14 DIAGNOSIS — H409 Unspecified glaucoma: Secondary | ICD-10-CM | POA: Insufficient documentation

## 2015-09-14 DIAGNOSIS — M1990 Unspecified osteoarthritis, unspecified site: Secondary | ICD-10-CM | POA: Insufficient documentation

## 2015-09-14 NOTE — Addendum Note (Signed)
Addended by: Yolonda KidaJACOBS, Cline Draheim M on: 09/14/2015 01:48 PM   Modules accepted: Medications

## 2015-09-17 ENCOUNTER — Ambulatory Visit (INDEPENDENT_AMBULATORY_CARE_PROVIDER_SITE_OTHER): Payer: Medicare Other | Admitting: Cardiovascular Disease

## 2015-09-17 ENCOUNTER — Encounter: Payer: Self-pay | Admitting: Cardiovascular Disease

## 2015-09-17 VITALS — BP 128/84 | HR 59 | Ht 69.0 in | Wt 212.2 lb

## 2015-09-17 DIAGNOSIS — R29898 Other symptoms and signs involving the musculoskeletal system: Secondary | ICD-10-CM | POA: Diagnosis not present

## 2015-09-17 DIAGNOSIS — I10 Essential (primary) hypertension: Secondary | ICD-10-CM

## 2015-09-17 DIAGNOSIS — R0789 Other chest pain: Secondary | ICD-10-CM | POA: Diagnosis not present

## 2015-09-17 DIAGNOSIS — E785 Hyperlipidemia, unspecified: Secondary | ICD-10-CM

## 2015-09-17 MED ORDER — NITROGLYCERIN 0.4 MG SL SUBL: 0.4000 mg | SUBLINGUAL_TABLET | SUBLINGUAL | Status: DC | PRN

## 2015-09-17 MED ORDER — ISOSORBIDE MONONITRATE ER 30 MG PO TB24: 30.0000 mg | ORAL_TABLET | Freq: Every day | ORAL | Status: DC

## 2015-09-17 NOTE — Patient Instructions (Signed)
Medication Instructions:  START Imdur 30 mg once daily TAKE Nitroglycerin as needed for chest pain - place 1 pill under tongue, repeat in 5 minutes if pain persists.  Do not take more than 3 tablets at one time   Labwork: None Ordered   Testing/Procedures: Your physician has requested that you have a lexiscan myoview. For further information please visit [email or web address] Please follow instruction sheet, as given.  Your physician has requested that you have an ankle brachial index (ABI). During this test an ultrasound and blood pressure cuff are used to evaluate the arteries that supply the arms and legs with blood. Allow thirty minutes for this exam. There are no restrictions or special instructions.   Follow-Up: Your physician recommends that you schedule a follow-up appointment in: 3 months with Dr. Elease HashimotoNahser   If you need a refill on your cardiac medications before your next appointment, please call your pharmacy.   Thank you for choosing [facility]! Eligha BridegroomMichelle Swinyer, RN [phone]

## 2015-09-17 NOTE — Progress Notes (Signed)
Cardiology Office Note   Date:  09/17/2015   ID:  [name] [name], DOB [birth year], MRN [id number]  PCP:  No primary care provider on file.  Cardiologist:   Woodie Degraffenreid, Deloris Ping, MD   Chief Complaint  Patient presents with  . Chest Pain     Problem List 1. Exertional chest pain  2. DM 2 3. Hyperlipidemia 4. Essential HTN   History of Present Illness: [name] [name] is a 81 y.o. male who presents for evaluation of exertional CP He's been very healthy and very active for many years. 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 He still keeps up with his yard work. This past weekend he was spreading e out on his front and back are. He spent about 160 pounds of grass seed. He noted that he started having some episodes of exertional chest pain. He stopped to rest and the pain resolved. He then resumed his yard work and the pain returned again. Deep left sided chest pain . , associated with dyspnea and leg weakness .  He was able to finish the artery without too much difficulty. Since that time, he's been having some exertional  leg fatigue, and generalized fatigue. He has not had any further chest pain    No dietary restrictions.   Past Medical History  Diagnosis Date  . DM (diabetes mellitus), type 2 with renal complications (HCC)   . Hypertension   . Hyperlipidemia   . Prostatic hypertrophy, benign, with obstruction   . Sick sinus syndrome (HCC)   . Obesity   . Microalbuminuria   . Glaucoma   . Renal calculus   . Osteoarthritis   . Chronic kidney disease, stage III (moderate)   . Chest pain   . Microalbuminuria 2013  . BPH with elevated PSA   . ED (erectile dysfunction)     Past Surgical History  Procedure Laterality Date  . Knee surgery    . Replacement total knee  02/26/01     Current Outpatient Prescriptions  Medication Sig Dispense Refill  . alfuzosin (UROXATRAL) 10 MG 24 hr tablet Take 10 mg by mouth daily with  breakfast.    . allopurinol (ZYLOPRIM) 100 MG tablet Take 100 mg by mouth daily.    Marland Kitchen aspirin 325 MG EC tablet Take 81 mg by mouth daily.     . brinzolamide (AZOPT) 1 % ophthalmic suspension Place 1 drop into both eyes 2 (two) times daily.    . finasteride (PROSCAR) 5 MG tablet Take 5 mg by mouth daily.  3  . glimepiride (AMARYL) 2 MG tablet Take 2 mg by mouth daily with breakfast.    . losartan (COZAAR) 100 MG tablet Take 100 mg by mouth daily.    . metFORMIN (GLUMETZA) 1000 MG (MOD) 24 hr tablet Take 1,000 mg by mouth daily with breakfast.    . Multiple Vitamins-Minerals (CENTRUM PO) Take by mouth 3 days.    Letta Pate DELICA LANCETS 33G MISC daily.    Letta Pate VERIO test strip daily.    Marland Kitchen RAPAFLO 8 MG CAPS capsule Take 8 mg by mouth daily.    . saxagliptin HCl (ONGLYZA) 5 MG TABS tablet Take 5 mg by mouth daily.    . simvastatin (ZOCOR) 80 MG tablet TAKE 1/2 (40 MG)TABLET AT 1800    . travoprost, benzalkonium, (TRAVATAN) 0.004 % ophthalmic solution Place 1 drop into both eyes at bedtime.     No current  facility-administered medications for this visit.    Allergies:   Review of patient's allergies indicates no known allergies.    Social History:  The patient  reports that he has quit smoking. His smoking use included Cigarettes. He does not have any smokeless tobacco history on file. He reports that he does not drink alcohol or use illicit drugs.   Family History:  The patient's family history includes Cancer in his paternal grandfather and sister; Cancer - Colon in his sister; Healthy in his child and child; Other in his child; Stroke in his father.    ROS:  Please see the history of present illness.    Review of Systems: Constitutional:  denies fever, chills, diaphoresis, appetite change and fatigue.  HEENT: denies photophobia, eye pain, redness, hearing loss, ear pain, congestion, sore throat, rhinorrhea, sneezing, neck pain, neck stiffness and tinnitus.  Respiratory: denies  SOB, DOE, cough, chest tightness, and wheezing.  Cardiovascular: denies chest pain, palpitations and leg swelling.  Gastrointestinal: denies nausea, vomiting, abdominal pain, diarrhea, constipation, blood in stool.  Genitourinary: denies dysuria, urgency, frequency, hematuria, flank pain and difficulty urinating.  Musculoskeletal: denies  myalgias, back pain, joint swelling, arthralgias and gait problem.   Skin: denies pallor, rash and wound.  Neurological: denies dizziness, seizures, syncope, weakness, light-headedness, numbness and headaches.   Hematological: denies adenopathy, easy bruising, personal or family bleeding history.  Psychiatric/ Behavioral: denies suicidal ideation, mood changes, confusion, nervousness, sleep disturbance and agitation.       All other systems are reviewed and negative.    PHYSICAL EXAM: VS:  BP 128/84 mmHg  Pulse 59  Ht  (1.753 m)  Wt 212 lb 3.2 oz (96.253 kg)  BMI 31.32 kg/m2 , BMI Body mass index is 31.32 kg/(m^2). GEN: Well nourished, well developed, in no acute distress HEENT: normal Neck: no JVD, carotid bruits, or masses Cardiac: Regular rate with frequent pauses ( has Mobitz I AV block in ECG) ; no murmurs, rubs, or gallops,no edema  Respiratory:  clear to auscultation bilaterally, normal work of breathing GI: soft, nontender, nondistended, + BS MS: no deformity or atrophy Leg pulses are 2+   Skin: warm and dry, no rash Neuro:  Strength and sensation are intact Psych: normal   EKG:  EKG is ordered today. The ekg ordered today demonstrates normal sinus rhythm. He has a Mobitz type I AV block. He has no ST or T wave changes.   Recent Labs: No results found for requested labs within last 365 days.    Lipid Panel No results found for: CHOL, TRIG, HDL, CHOLHDL, VLDL, LDLCALC, LDLDIRECT    Wt Readings from Last 3 Encounters:  09/17/15 212 lb 3.2 oz (96.253 kg)  04/22/08 230 lb 3 oz (104.412 kg)      Other studies  Reviewed: Additional studies/ records that were reviewed today include: . Review of the above records demonstrates:    ASSESSMENT AND PLAN:  1.  Exertional chest pain: The patient describes exertional chest discomfort while he was spreading grass seed.   He had these episodes of chest pain several times . This is very unusual for him. He's been quite active all of his life. 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 We will order a Lexiscan Myoview study for further evaluation.  2. Exertional leg fatigue: I was able to feal  his posterior tibialis pulses bilaterally. I doubt that he has significant PVD  but we will get a set of ankle-brachial indexes for official evaluation.  Current medicines are reviewed at length with the patient today.  The patient does not have concerns regarding medicines.  The following changes have been made:  no change  Labs/ tests ordered today include:  No orders of the defined types were placed in this encounter.     Disposition:   FU with 3 months      Lexianna Weinrich, Deloris PingPhilip J, MD  09/17/2015 10:16 AM    [facility] [address], [place], [place]  [address] Phone: [phone]; Fax: [phone]   [facility]  [address] [place], [place]  [address] [phone]   Fax [phone]

## 2015-09-28 ENCOUNTER — Ambulatory Visit (HOSPITAL_COMMUNITY)
Admission: RE | Admit: 2015-09-28 | Discharge: 2015-09-28 | Disposition: A | Discharge status: Home / Self Care | Payer: Medicare Other | Source: Ambulatory Visit | Attending: Cardiology | Admitting: Cardiology

## 2015-09-28 ENCOUNTER — Other Ambulatory Visit: Payer: Self-pay | Admitting: Cardiovascular Disease

## 2015-09-28 DIAGNOSIS — N183 Chronic kidney disease, stage 3 (moderate): Secondary | ICD-10-CM | POA: Diagnosis not present

## 2015-09-28 DIAGNOSIS — I129 Hypertensive chronic kidney disease with stage 1 through stage 4 chronic kidney disease, or unspecified chronic kidney disease: Secondary | ICD-10-CM | POA: Insufficient documentation

## 2015-09-28 DIAGNOSIS — E785 Hyperlipidemia, unspecified: Secondary | ICD-10-CM | POA: Diagnosis not present

## 2015-09-28 DIAGNOSIS — M79606 Pain in leg, unspecified: Secondary | ICD-10-CM | POA: Insufficient documentation

## 2015-09-28 DIAGNOSIS — R29898 Other symptoms and signs involving the musculoskeletal system: Secondary | ICD-10-CM

## 2015-09-29 ENCOUNTER — Telehealth (HOSPITAL_COMMUNITY): Payer: Self-pay | Admitting: *Deleted

## 2015-09-29 NOTE — Telephone Encounter (Signed)
Patient attempted to contact x3 in reference to upcoming appt in nuclear medicine. Patient has not set up voicemail box therefore unable to leave message.[name] [name], Adelene IdlerCynthia W

## 2015-10-01 ENCOUNTER — Ambulatory Visit (HOSPITAL_COMMUNITY): Payer: Medicare Other | Attending: Cardiovascular Disease

## 2015-10-01 DIAGNOSIS — R0789 Other chest pain: Secondary | ICD-10-CM | POA: Diagnosis not present

## 2015-10-01 DIAGNOSIS — R9439 Abnormal result of other cardiovascular function study: Secondary | ICD-10-CM | POA: Insufficient documentation

## 2015-10-01 DIAGNOSIS — I10 Essential (primary) hypertension: Secondary | ICD-10-CM | POA: Insufficient documentation

## 2015-10-01 DIAGNOSIS — R0609 Other forms of dyspnea: Secondary | ICD-10-CM | POA: Insufficient documentation

## 2015-10-01 DIAGNOSIS — E119 Type 2 diabetes mellitus without complications: Secondary | ICD-10-CM | POA: Insufficient documentation

## 2015-10-01 LAB — MYOCARDIAL PERFUSION IMAGING
CHL CUP NUCLEAR SDS: 1
CHL CUP STRESS STAGE 1 GRADE: 0 %
CHL CUP STRESS STAGE 1 HR: 64 {beats}/min
CHL CUP STRESS STAGE 1 SBP: 142 mmHg
CHL CUP STRESS STAGE 1 SPEED: 0 mph
CHL CUP STRESS STAGE 2 GRADE: 0 %
CHL CUP STRESS STAGE 2 HR: 61 {beats}/min
CHL CUP STRESS STAGE 2 SPEED: 0 mph
CHL CUP STRESS STAGE 3 SPEED: 0 mph
CHL CUP STRESS STAGE 4 GRADE: 0 %
CHL CUP STRESS STAGE 4 SPEED: 0 mph
CHL CUP STRESS STAGE 5 DBP: 72 mmHg
CHL CUP STRESS STAGE 5 GRADE: 0 %
CHL CUP STRESS STAGE 5 HR: 46 {beats}/min
CHL CUP STRESS STAGE 5 SBP: 132 mmHg
CHL CUP STRESS STAGE 5 SPEED: 0 mph
CHL CUP STRESS STAGE 6 DBP: 68 mmHg
CHL CUP STRESS STAGE 6 SBP: 127 mmHg
CSEPPMHR: 46 %
Estimated workload: 1 METS
LHR: 0.34
LV sys vol: 41 mL
LVDIAVOL: 101 mL
NUC STRESS TID: 1.07
Peak HR: 62 {beats}/min
Rest HR: 64 {beats}/min
SRS: 14
SSS: 15
Stage 1 DBP: 78 mmHg
Stage 3 DBP: 75 mmHg
Stage 3 Grade: 0 %
Stage 3 HR: 60 {beats}/min
Stage 3 SBP: 112 mmHg
Stage 4 HR: 62 {beats}/min
Stage 6 Grade: 0 %
Stage 6 HR: 55 {beats}/min
Stage 6 Speed: 0 mph

## 2015-10-01 MED ORDER — REGADENOSON 0.4 MG/5ML IV SOLN
0.4000 mg | Freq: Once | INTRAVENOUS | Status: AC
Start: 2015-10-01 — End: 2015-10-01
  Administered 2015-10-01: 0.4 mg via INTRAVENOUS

## 2015-10-01 MED ORDER — TECHNETIUM TC 99M SESTAMIBI GENERIC - CARDIOLITE
31.2000 | Freq: Once | INTRAVENOUS | Status: AC | PRN
  Administered 2015-10-01: 31.2 via INTRAVENOUS

## 2015-10-01 MED ORDER — TECHNETIUM TC 99M SESTAMIBI GENERIC - CARDIOLITE
10.8000 | Freq: Once | INTRAVENOUS | Status: AC | PRN
  Administered 2015-10-01: 11 via INTRAVENOUS

## 2015-10-02 ENCOUNTER — Telehealth: Payer: Self-pay | Admitting: Nurse Practitioner

## 2015-10-02 DIAGNOSIS — R42 Dizziness and giddiness: Secondary | ICD-10-CM

## 2015-10-02 DIAGNOSIS — R001 Bradycardia, unspecified: Secondary | ICD-10-CM

## 2015-10-02 NOTE — Telephone Encounter (Signed)
Spoke with patient and advised that Dr. Elease HashimotoNahser would like to place a 30 day event monitor based on the  dizziness and bradycardia patient experienced during myoview.  Patient verbalized understanding and agreement and I advised that someone from our office will call him to schedule.

## 2015-10-05 ENCOUNTER — Ambulatory Visit (INDEPENDENT_AMBULATORY_CARE_PROVIDER_SITE_OTHER): Payer: Medicare Other

## 2015-10-05 DIAGNOSIS — R001 Bradycardia, unspecified: Secondary | ICD-10-CM

## 2015-10-05 DIAGNOSIS — R42 Dizziness and giddiness: Secondary | ICD-10-CM

## 2015-11-11 DIAGNOSIS — N529 Male erectile dysfunction, unspecified: Secondary | ICD-10-CM | POA: Diagnosis not present

## 2015-11-11 DIAGNOSIS — R808 Other proteinuria: Secondary | ICD-10-CM | POA: Diagnosis not present

## 2015-11-11 DIAGNOSIS — Z6832 Body mass index (BMI) 32.0-32.9, adult: Secondary | ICD-10-CM | POA: Diagnosis not present

## 2015-11-11 DIAGNOSIS — E1129 Type 2 diabetes mellitus with other diabetic kidney complication: Secondary | ICD-10-CM | POA: Diagnosis not present

## 2015-11-11 DIAGNOSIS — H409 Unspecified glaucoma: Secondary | ICD-10-CM | POA: Diagnosis not present

## 2015-11-11 DIAGNOSIS — I495 Sick sinus syndrome: Secondary | ICD-10-CM | POA: Diagnosis not present

## 2015-11-11 DIAGNOSIS — I10 Essential (primary) hypertension: Secondary | ICD-10-CM | POA: Diagnosis not present

## 2015-11-11 DIAGNOSIS — Z1389 Encounter for screening for other disorder: Secondary | ICD-10-CM | POA: Diagnosis not present

## 2015-11-11 DIAGNOSIS — N200 Calculus of kidney: Secondary | ICD-10-CM | POA: Diagnosis not present

## 2015-11-11 DIAGNOSIS — N183 Chronic kidney disease, stage 3 (moderate): Secondary | ICD-10-CM | POA: Diagnosis not present

## 2015-11-11 DIAGNOSIS — N401 Enlarged prostate with lower urinary tract symptoms: Secondary | ICD-10-CM | POA: Diagnosis not present

## 2015-11-11 DIAGNOSIS — E668 Other obesity: Secondary | ICD-10-CM | POA: Diagnosis not present

## 2015-11-12 DIAGNOSIS — E119 Type 2 diabetes mellitus without complications: Secondary | ICD-10-CM | POA: Diagnosis not present

## 2015-11-12 DIAGNOSIS — Z7984 Long term (current) use of oral hypoglycemic drugs: Secondary | ICD-10-CM | POA: Diagnosis not present

## 2015-11-12 DIAGNOSIS — H401131 Primary open-angle glaucoma, bilateral, mild stage: Secondary | ICD-10-CM | POA: Diagnosis not present

## 2015-11-27 DIAGNOSIS — R350 Frequency of micturition: Secondary | ICD-10-CM | POA: Diagnosis not present

## 2015-11-27 DIAGNOSIS — Z0000 Encounter for general adult medical examination without abnormal findings: Secondary | ICD-10-CM | POA: Diagnosis not present

## 2015-11-27 DIAGNOSIS — N401 Enlarged prostate with lower urinary tract symptoms: Secondary | ICD-10-CM | POA: Diagnosis not present

## 2015-11-27 DIAGNOSIS — R3915 Urgency of urination: Secondary | ICD-10-CM | POA: Diagnosis not present

## 2015-12-10 ENCOUNTER — Emergency Department (HOSPITAL_COMMUNITY): Payer: Medicare Other

## 2015-12-10 ENCOUNTER — Encounter (HOSPITAL_COMMUNITY): Payer: Self-pay

## 2015-12-10 ENCOUNTER — Emergency Department (HOSPITAL_COMMUNITY)
Admission: EM | Admit: 2015-12-10 | Discharge: 2015-12-10 | Disposition: A | Discharge status: Home / Self Care | Payer: Medicare Other | Attending: Emergency Medicine | Admitting: Emergency Medicine

## 2015-12-10 DIAGNOSIS — Z7901 Long term (current) use of anticoagulants: Secondary | ICD-10-CM | POA: Insufficient documentation

## 2015-12-10 DIAGNOSIS — I4891 Unspecified atrial fibrillation: Secondary | ICD-10-CM | POA: Diagnosis not present

## 2015-12-10 DIAGNOSIS — H409 Unspecified glaucoma: Secondary | ICD-10-CM | POA: Insufficient documentation

## 2015-12-10 DIAGNOSIS — Z87442 Personal history of urinary calculi: Secondary | ICD-10-CM | POA: Insufficient documentation

## 2015-12-10 DIAGNOSIS — Z7982 Long term (current) use of aspirin: Secondary | ICD-10-CM | POA: Insufficient documentation

## 2015-12-10 DIAGNOSIS — E119 Type 2 diabetes mellitus without complications: Secondary | ICD-10-CM | POA: Insufficient documentation

## 2015-12-10 DIAGNOSIS — N400 Enlarged prostate without lower urinary tract symptoms: Secondary | ICD-10-CM | POA: Insufficient documentation

## 2015-12-10 DIAGNOSIS — R05 Cough: Secondary | ICD-10-CM | POA: Diagnosis not present

## 2015-12-10 DIAGNOSIS — E785 Hyperlipidemia, unspecified: Secondary | ICD-10-CM | POA: Insufficient documentation

## 2015-12-10 DIAGNOSIS — N183 Chronic kidney disease, stage 3 (moderate): Secondary | ICD-10-CM | POA: Diagnosis not present

## 2015-12-10 DIAGNOSIS — Z79899 Other long term (current) drug therapy: Secondary | ICD-10-CM | POA: Insufficient documentation

## 2015-12-10 DIAGNOSIS — R509 Fever, unspecified: Secondary | ICD-10-CM | POA: Insufficient documentation

## 2015-12-10 DIAGNOSIS — E669 Obesity, unspecified: Secondary | ICD-10-CM | POA: Diagnosis not present

## 2015-12-10 DIAGNOSIS — I129 Hypertensive chronic kidney disease with stage 1 through stage 4 chronic kidney disease, or unspecified chronic kidney disease: Secondary | ICD-10-CM | POA: Diagnosis not present

## 2015-12-10 DIAGNOSIS — Z7984 Long term (current) use of oral hypoglycemic drugs: Secondary | ICD-10-CM | POA: Diagnosis not present

## 2015-12-10 DIAGNOSIS — Z87891 Personal history of nicotine dependence: Secondary | ICD-10-CM | POA: Insufficient documentation

## 2015-12-10 DIAGNOSIS — R0981 Nasal congestion: Secondary | ICD-10-CM | POA: Diagnosis not present

## 2015-12-10 DIAGNOSIS — R0602 Shortness of breath: Secondary | ICD-10-CM | POA: Diagnosis not present

## 2015-12-10 DIAGNOSIS — M1990 Unspecified osteoarthritis, unspecified site: Secondary | ICD-10-CM | POA: Diagnosis not present

## 2015-12-10 DIAGNOSIS — R059 Cough, unspecified: Secondary | ICD-10-CM

## 2015-12-10 LAB — CBC WITH DIFFERENTIAL/PLATELET
Basophils Absolute: 0 10*3/uL (ref 0.0–0.1)
Basophils Relative: 0 %
EOS ABS: 0.1 10*3/uL (ref 0.0–0.7)
EOS PCT: 1 %
HCT: 41.3 % (ref 39.0–52.0)
Hemoglobin: 13.8 g/dL (ref 13.0–17.0)
LYMPHS ABS: 0.5 10*3/uL — AB (ref 0.7–4.0)
Lymphocytes Relative: 8 %
MCH: 29.7 pg (ref 26.0–34.0)
MCHC: 33.4 g/dL (ref 30.0–36.0)
MCV: 89 fL (ref 78.0–100.0)
MONO ABS: 0.3 10*3/uL (ref 0.1–1.0)
MONOS PCT: 5 %
Neutro Abs: 5.4 10*3/uL (ref 1.7–7.7)
Neutrophils Relative %: 87 %
PLATELETS: 116 10*3/uL — AB (ref 150–400)
RBC: 4.64 MIL/uL (ref 4.22–5.81)
RDW: 14.9 % (ref 11.5–15.5)
WBC: 6.2 10*3/uL (ref 4.0–10.5)

## 2015-12-10 LAB — URINALYSIS, ROUTINE W REFLEX MICROSCOPIC
BILIRUBIN URINE: NEGATIVE
Glucose, UA: NEGATIVE mg/dL
KETONES UR: NEGATIVE mg/dL
LEUKOCYTES UA: NEGATIVE
NITRITE: NEGATIVE
Protein, ur: 30 mg/dL — AB
SPECIFIC GRAVITY, URINE: 1.021 (ref 1.005–1.030)
pH: 5 (ref 5.0–8.0)

## 2015-12-10 LAB — URINE MICROSCOPIC-ADD ON

## 2015-12-10 LAB — COMPREHENSIVE METABOLIC PANEL
ALT: 24 U/L (ref 17–63)
ANION GAP: 10 (ref 5–15)
AST: 22 U/L (ref 15–41)
Albumin: 4.3 g/dL (ref 3.5–5.0)
Alkaline Phosphatase: 81 U/L (ref 38–126)
BUN: 26 mg/dL — ABNORMAL HIGH (ref 6–20)
CALCIUM: 9.3 mg/dL (ref 8.9–10.3)
CHLORIDE: 108 mmol/L (ref 101–111)
CO2: 22 mmol/L (ref 22–32)
Creatinine, Ser: 1.44 mg/dL — ABNORMAL HIGH (ref 0.61–1.24)
GFR, EST AFRICAN AMERICAN: 48 mL/min — AB (ref >60)
GFR, EST NON AFRICAN AMERICAN: 42 mL/min — AB (ref >60)
GLUCOSE: 180 mg/dL — AB (ref 65–99)
Potassium: 4.4 mmol/L (ref 3.5–5.1)
SODIUM: 140 mmol/L (ref 135–145)
Total Bilirubin: 1.1 mg/dL (ref 0.3–1.2)
Total Protein: 7.2 g/dL (ref 6.5–8.1)

## 2015-12-10 MED ORDER — APIXABAN 5 MG PO TABS: 5.0000 mg | ORAL_TABLET | Freq: Two times a day (BID) | ORAL | Status: DC

## 2015-12-10 MED ORDER — BENZONATATE 100 MG PO CAPS: 100.0000 mg | ORAL_CAPSULE | Freq: Three times a day (TID) | ORAL | Status: DC

## 2015-12-10 MED ORDER — BENZONATATE 100 MG PO CAPS
100.0000 mg | ORAL_CAPSULE | Freq: Once | ORAL | Status: AC
  Administered 2015-12-10: 100 mg via ORAL
  Filled 2015-12-10: qty 1

## 2015-12-10 MED ORDER — APIXABAN 5 MG PO TABS
5.0000 mg | ORAL_TABLET | Freq: Two times a day (BID) | ORAL | Status: DC
  Administered 2015-12-10: 5 mg via ORAL
  Filled 2015-12-10: qty 1

## 2015-12-10 MED ORDER — IPRATROPIUM BROMIDE 0.02 % IN SOLN
0.5000 mg | Freq: Once | RESPIRATORY_TRACT | Status: AC
  Administered 2015-12-10: 0.5 mg via RESPIRATORY_TRACT
  Filled 2015-12-10: qty 2.5

## 2015-12-10 MED ORDER — ALBUTEROL SULFATE (2.5 MG/3ML) 0.083% IN NEBU
5.0000 mg | INHALATION_SOLUTION | Freq: Once | RESPIRATORY_TRACT | Status: AC
  Administered 2015-12-10: 5 mg via RESPIRATORY_TRACT
  Filled 2015-12-10: qty 6

## 2015-12-10 NOTE — Progress Notes (Signed)
ANTICOAGULATION CONSULT NOTE - Initial Consult  Pharmacy Consult for Apixaban Indication: atrial fibrillation  No Known Allergies  Patient Measurements: weight 98 kg per patient    Vital Signs: Temp: 98.6 F (37 C) (02/09 1823) Temp Source: Oral (02/09 1606) BP: 154/85 mmHg (02/09 2011) Pulse Rate: 74 (02/09 2011)  Labs:  Recent Labs  12/10/15 1651  HGB 13.8  HCT 41.3  PLT 116*  CREATININE 1.44*    CrCl cannot be calculated (Unknown ideal weight.).   Medical History: Past Medical History  Diagnosis Date  . DM (diabetes mellitus), type 2 with renal complications (HCC)   . Hypertension   . Hyperlipidemia   . Prostatic hypertrophy, benign, with obstruction   . Sick sinus syndrome (HCC)   . Obesity   . Microalbuminuria   . Glaucoma   . Renal calculus   . Osteoarthritis   . Chronic kidney disease, stage III (moderate)   . Chest pain   . Microalbuminuria 2013  . BPH with elevated PSA   . ED (erectile dysfunction)     Assessment: Patent is an 82 y.o M who was instructed by Urgent Care to come to the ED for workup of cough and fever.  He is found to be in Afib in the ED and cardiology recommends to start Apixaban.   Plan:  - Apixaban  PO BID  Heylee Tant P 12/10/2015,8:41 PM

## 2015-12-10 NOTE — ED Notes (Signed)
Per pt, cough x 2 days.  Had been burning limbs.. Cough not getting better.  Went to urgent care today and they told him he had fever and to come here.  No distress.

## 2015-12-10 NOTE — ED Notes (Signed)
Patient d/c'd self care.  F/u and medications discussed.  Patient and family verbalized understanding.

## 2015-12-10 NOTE — ED Provider Notes (Signed)
CSN: [id number]     Arrival date & time 12/10/15  1531 History   First MD Initiated Contact with Patient 12/10/15 1610     Chief Complaint  Patient presents with  . Fever  . Cough    HPI   [name] [name] is a 82 y.o. male with a PMH of DM, HTN, HLD, sick sinus syndrome, CKD who presents to the ED with cough, which he states started 2 days ago. He notes he has been burning trash for the past 2 days, and thinks this may have precipitated his symptoms. He denies exacerbating factors. He has tried cough syrup with minimal symptom relief. He was seen at urgent care today prior to arrival, and was noted to have a fever to 101 and was advised to come to the ED. He notes associated nasal congestion. He denies chills, chest pain, shortness of breath, abdominal pain, N/V/D/C, dysuria, urgency, frequency.   Past Medical History  Diagnosis Date  . DM (diabetes mellitus), type 2 with renal complications (HCC)   . Hypertension   . Hyperlipidemia   . Prostatic hypertrophy, benign, with obstruction   . Sick sinus syndrome (HCC)   . Obesity   . Microalbuminuria   . Glaucoma   . Renal calculus   . Osteoarthritis   . Chronic kidney disease, stage III (moderate)   . Chest pain   . Microalbuminuria 2013  . BPH with elevated PSA   . ED (erectile dysfunction)    Past Surgical History  Procedure Laterality Date  . Knee surgery    . Replacement total knee  02/26/01   Family History  Problem Relation Age of Onset  . Cancer Paternal Grandfather     STOMACH  . Cancer Sister     MALE CANCER  . Cancer - Colon Sister   . Healthy Child   . Healthy Child   . Other Child     BRAIN DAMAGE AT BIRTH  . Stroke Father    Social History  Substance Use Topics  . Smoking status: Former Smoker    Types: Cigarettes  . Smokeless tobacco: None  . Alcohol Use: No     Review of Systems  Constitutional: Positive for fever. Negative for chills.  HENT: Positive for congestion.   Respiratory: Positive  for cough. Negative for shortness of breath.   Gastrointestinal: Negative for nausea, vomiting, abdominal pain, diarrhea and constipation.  Genitourinary: Negative for dysuria, urgency and frequency.  Neurological: Negative for syncope, weakness and numbness.  All other systems reviewed and are negative.     Allergies  Review of patient's allergies indicates no known allergies.  Home Medications   Prior to Admission medications   Medication Sig Start Date End Date Taking? Authorizing Provider  alfuzosin (UROXATRAL) 10 MG 24 hr tablet Take 10 mg by mouth daily with breakfast.   Yes Historical Provider, MD  allopurinol (ZYLOPRIM) 100 MG tablet Take 100 mg by mouth daily.   Yes Historical Provider, MD  aspirin 81 MG tablet Take 81 mg by mouth daily.   Yes Historical Provider, MD  brinzolamide (AZOPT) 1 % ophthalmic suspension Place 1 drop into both eyes 2 (two) times daily.   Yes Historical Provider, MD  finasteride (PROSCAR) 5 MG tablet Take 5 mg by mouth daily. 08/12/15  Yes Historical Provider, MD  glimepiride (AMARYL) 2 MG tablet Take 2 mg by mouth daily with breakfast.   Yes Historical Provider, MD  isosorbide mononitrate (IMDUR) 30 MG 24 hr tablet Take 1 tablet (  30 mg total) by mouth daily. 09/17/15  Yes Vesta Mixer, MD  losartan (COZAAR) 100 MG tablet Take 100 mg by mouth daily.   Yes Historical Provider, MD  metFORMIN (GLUMETZA) 1000 MG (MOD) 24 hr tablet Take 1,000 mg by mouth daily with breakfast.   Yes Historical Provider, MD  Multiple Vitamins-Minerals (CENTRUM PO) Take 1 tablet by mouth every other day.    Yes Historical Provider, MD  MYRBETRIQ 50 MG TB24 tablet Take 50 mg by mouth daily. 11/27/15  Yes Historical Provider, MD  nitroGLYCERIN (NITROSTAT) 0.4 MG SL tablet Place 1 tablet (0.4 mg total) under the tongue every 5 (five) minutes as needed for chest pain. 09/17/15  Yes Vesta Mixer, MD  [facility] DELICA LANCETS 33G MISC 1 each daily.  07/29/15  Yes Historical  Provider, MD  Letta Pate VERIO test strip 1 each daily.  09/08/15  Yes Historical Provider, MD  RAPAFLO 8 MG CAPS capsule Take 8 mg by mouth daily. 09/15/15  Yes Historical Provider, MD  saxagliptin HCl (ONGLYZA) 5 MG TABS tablet Take 5 mg by mouth daily.   Yes Historical Provider, MD  simvastatin (ZOCOR) 80 MG tablet TAKE 1/2 (40 MG)TABLET AT 1800   Yes Historical Provider, MD  travoprost, benzalkonium, (TRAVATAN) 0.004 % ophthalmic solution Place 1 drop into both eyes at bedtime.   Yes Historical Provider, MD  apixaban (ELIQUIS) 5 MG TABS tablet Take 1 tablet (5 mg total) by mouth 2 (two) times daily. 12/10/15   Mady Gemma, PA-C  benzonatate (TESSALON) 100 MG capsule Take 1 capsule (100 mg total) by mouth every 8 (eight) hours. 12/10/15   Mady Gemma, PA-C    BP 159/74 mmHg  Pulse 75  Temp(Src) 98.6 F (37 C) (Oral)  Resp 17  Wt 97.523 kg  SpO2 94% Physical Exam  Constitutional: He is oriented to person, place, and time. He appears well-developed and well-nourished. No distress.  HENT:  Head: Normocephalic and atraumatic.  Right Ear: External ear normal.  Left Ear: External ear normal.  Nose: Nose normal.  Mouth/Throat: Uvula is midline, oropharynx is clear and moist and mucous membranes are normal.  Eyes: Conjunctivae, EOM and lids are normal. Pupils are equal, round, and reactive to light. Right eye exhibits no discharge. Left eye exhibits no discharge. No scleral icterus.  Neck: Normal range of motion. Neck supple.  Cardiovascular: Normal rate, regular rhythm, normal heart sounds, intact distal pulses and normal pulses.   Pulmonary/Chest: Effort normal and breath sounds normal. No respiratory distress. He has no wheezes. He has no rales.  Abdominal: Soft. Normal appearance and bowel sounds are normal. He exhibits no distension and no mass. There is no tenderness. There is no rigidity, no rebound and no guarding.  Musculoskeletal: Normal range of motion. He exhibits no  edema or tenderness.  Neurological: He is alert and oriented to person, place, and time. He has normal strength. No sensory deficit.  Skin: Skin is warm, dry and intact. No rash noted. He is not diaphoretic. No erythema. No pallor.  Psychiatric: He has a normal mood and affect. His speech is normal and behavior is normal.  Nursing note and vitals reviewed.   ED Course  Procedures (including critical care time)  Labs Review Labs Reviewed  CBC WITH DIFFERENTIAL/PLATELET - Abnormal; Notable for the following:    Platelets 116 (*)    Lymphs Abs 0.5 (*)    All other components within normal limits  COMPREHENSIVE METABOLIC PANEL - Abnormal; Notable for the  following:    Glucose, Bld 180 (*)    BUN 26 (*)    Creatinine, Ser 1.44 (*)    GFR calc non Af Amer 42 (*)    GFR calc Af Amer 48 (*)    All other components within normal limits  URINALYSIS, ROUTINE W REFLEX MICROSCOPIC (NOT AT [facility]) - Abnormal; Notable for the following:    Hgb urine dipstick SMALL (*)    Protein, ur 30 (*)    All other components within normal limits  URINE MICROSCOPIC-ADD ON - Abnormal; Notable for the following:    Squamous Epithelial / LPF 0-5 (*)    Bacteria, UA FEW (*)    All other components within normal limits    Imaging Review Dg Chest 2 View  12/10/2015  CLINICAL DATA:  Dry cough x 2 days. Pt was told he had a fever at Urgent Care. Hx HTN. Diabetic. Former smoker. EXAM: CHEST  2 VIEW COMPARISON:  03/19/2006 FINDINGS: Heart size is normal. Lungs are free of focal consolidations and pleural effusions. No pulmonary edema. Thoracic spondylosis present. IMPRESSION: No evidence for acute cardiopulmonary abnormality. Electronically Signed   By: Norva Pavlov M.D.   On: 12/10/2015 16:36     I have personally reviewed and evaluated these images and lab results as part of my medical decision-making.   EKG Interpretation   Date/Time:  Thursday December 10 2015 19:41:38 EST Ventricular Rate:  64 PR  Interval:    QRS Duration: 95 QT Interval:  402 QTC Calculation: 415 R Axis:   -26 Text Interpretation:  Atrial fibrillation Borderline left axis deviation  Anterior infarct, old Minimal ST depression, lateral leads atrial  fibrillation is new compared to ECG in 2002 Confirmed by KNAPP  MD-J, JON  ([id number]) on 12/10/2015 7:55:41 PM      MDM   Final diagnoses:  Cough  Atrial fibrillation, unspecified type [facility])    82 year old male presents with cough s/p burning trash x 2 days; was evaluated at urgent care prior to arrival and noted to have fever to 101, and was subsequently sent to the ED. Denies chest pain, shortness of breath.   Temp 100.1 in the ED. No tachycardia or hypoxia. Heart RRR. Lungs clear to auscultation bilaterally. No lower extremity edema.   EKG atrial fibrillation, borderline left axis deviation, HR 75.  CBC negative for leukocytosis or anemia. CMP remarkable for creatinine 1.44. UA negative for infection.  CXR no acute cardiopulmonary abnormality.   Patient discussed with and seen by Dr. Lynelle Doctor. Will give breathing treatment. Cardiology consulted given new onset atrial fibrillation. Spoke with Dr. Antoine Poche, who advised to start patient on eliquis if no bleeding contraindications (chadsvasc 4) and to follow-up with cardiology (patient has appointment with cardiology scheduled for next week). Discussed risks and benefits of anticoagulation with patient and family, who are in agreement with plan. Pharmacy consulted for dosing, advised 5 mg BID. Will give first dose in ED. Patient is non-toxic and well-appearing, feel he is stable for discharge at this time. Will prescribe tessalon for cough. Return precautions discussed.   BP 159/74 mmHg  Pulse 75  Temp(Src) 98.6 F (37 C) (Oral)  Resp 17  Wt 97.523 kg  SpO2 94%     Mady Gemma, PA-C 12/11/15 0159

## 2015-12-10 NOTE — Discharge Instructions (Signed)
1. Medications: tessalon (cough medicine), eliquis (blood thinner), usual home medications 2. Treatment: rest, drink plenty of fluids 3. Follow Up: please followup with your cardiologist next week as scheduled for discussion of your diagnoses and further evaluation after today's visit; please return to the ER for new or worsening symptoms   Atrial Fibrillation Atrial fibrillation is a type of heartbeat that is irregular or fast (rapid). If you have this condition, your heart keeps quivering in a weird (chaotic) way. This condition can make it so your heart cannot pump blood normally. Having this condition gives a person more risk for stroke, heart failure, and other heart problems. There are different types of atrial fibrillation. Talk with your doctor to learn about the type that you have. HOME CARE  Take over-the-counter and prescription medicines only as told by your doctor.  If your doctor prescribed a blood-thinning medicine, take it exactly as told. Taking too much of it can cause bleeding. If you do not take enough of it, you will not have the protection that you need against stroke and other problems.  Do not use any tobacco products. These include cigarettes, chewing tobacco, and e-cigarettes. If you need help quitting, ask your doctor.  If you have apnea (obstructive sleep apnea), manage it as told by your doctor.  Do not drink alcohol.  Do not drink beverages that have caffeine. These include coffee, soda, and tea.  Maintain a healthy weight. Do not use diet pills unless your doctor says they are safe for you. Diet pills may make heart problems worse.  Follow diet instructions as told by your doctor.  Exercise regularly as told by your doctor.  Keep all follow-up visits as told by your doctor. This is important. GET HELP IF:  You notice a change in the speed, rhythm, or strength of your heartbeat.  You are taking a blood-thinning medicine and you notice more  bruising.  You get tired more easily when you move or exercise. GET HELP RIGHT AWAY IF:  You have pain in your chest or your belly (abdomen).  You have sweating or weakness.  You feel sick to your stomach (nauseous).  You notice blood in your throw up (vomit), poop (stool), or pee (urine).  You are short of breath.  You suddenly have swollen feet and ankles.  You feel dizzy.  Your suddenly get weak or numb in your face, arms, or legs, especially if it happens on one side of your body.  You have trouble talking, trouble understanding, or both.  Your face or your eyelid droops on one side. These symptoms may be an emergency. Do not wait to see if the symptoms will go away. Get medical help right away. Call your local emergency services (911 in the [place] Do not drive yourself to the hospital.   This information is not intended to replace advice given to you by your health care provider. Make sure you discuss any questions you have with your health care provider.   Document Released: 07/26/2008 Document Revised: 07/08/2015 Document Reviewed: 02/11/2015 Elsevier Interactive Patient Education [employer]

## 2015-12-10 NOTE — ED Notes (Signed)
Pharmacy called to confirm patient weight.  Per documents provided by patient family patient weighs 215lbs.

## 2015-12-10 NOTE — ED Provider Notes (Addendum)
Pt presents to the ED with cough and congestion.  Labs are normal.  CXR without PNA or CHF.  Most likely viral illness.  However EKG is suggestive of a fib which is new .  CHAds Vasc 2 is 4.  Will consult with cardiology.  Pt should be able to follow up cardiology as an outpatient.  Linwood Dibbles, MD 12/10/15 1959  Medical screening examination/treatment/procedure(s) were conducted as a shared visit with non-physician practitioner(s) and myself.  I personally evaluated the patient during the encounter.   EKG Interpretation   Date/Time:  Thursday December 10 2015 19:41:38 EST Ventricular Rate:  64 PR Interval:    QRS Duration: 95 QT Interval:  402 QTC Calculation: 415 R Axis:   -26 Text Interpretation:  Atrial fibrillation Borderline left axis deviation  Anterior infarct, old Minimal ST depression, lateral leads atrial  fibrillation is new compared to ECG in 2002 Confirmed by Lakrisha Iseman  MD-J, Florabelle Cardin  [phone]) on 12/10/2015 7:55:41 PM        Linwood Dibbles, MD 01/20/16 [phone]

## 2015-12-17 ENCOUNTER — Encounter: Payer: Self-pay | Admitting: Cardiovascular Disease

## 2015-12-17 ENCOUNTER — Ambulatory Visit (INDEPENDENT_AMBULATORY_CARE_PROVIDER_SITE_OTHER): Payer: Medicare Other | Admitting: Cardiovascular Disease

## 2015-12-17 VITALS — BP 140/70 | HR 64 | Ht 69.0 in | Wt 210.1 lb

## 2015-12-17 DIAGNOSIS — I25119 Atherosclerotic heart disease of native coronary artery with unspecified angina pectoris: Secondary | ICD-10-CM

## 2015-12-17 DIAGNOSIS — I2510 Atherosclerotic heart disease of native coronary artery without angina pectoris: Secondary | ICD-10-CM | POA: Insufficient documentation

## 2015-12-17 MED ORDER — ALBUTEROL SULFATE HFA 108 (90 BASE) MCG/ACT IN AERS: 2.0000 | INHALATION_SPRAY | Freq: Four times a day (QID) | RESPIRATORY_TRACT | Status: DC | PRN
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
## 2015-12-17 NOTE — Patient Instructions (Addendum)
Medication Instructions:  You may use the albuterol inhaler 1-2 puffs every 6 hours as needed for coughing/wheezing  Labwork: None Ordered   Testing/Procedures: None Ordered   Follow-Up: Your physician wants you to follow-up in: 6 months with Dr. Elease Hashimoto.  You will receive a reminder letter in the mail two months in advance. If you don't receive a letter, please call our office to schedule the follow-up appointment.   If you need a refill on your cardiac medications before your next appointment, please call your pharmacy.   Thank you for choosing [facility]! Eligha Bridegroom, RN [phone]

## 2015-12-18 ENCOUNTER — Ambulatory Visit: Payer: Medicare Other | Admitting: Cardiovascular Disease

## 2016-02-19 DIAGNOSIS — N183 Chronic kidney disease, stage 3 (moderate): Secondary | ICD-10-CM | POA: Diagnosis not present

## 2016-02-19 DIAGNOSIS — E7800 Pure hypercholesterolemia, unspecified: Secondary | ICD-10-CM | POA: Diagnosis not present

## 2016-02-19 DIAGNOSIS — I480 Paroxysmal atrial fibrillation: Secondary | ICD-10-CM | POA: Diagnosis not present

## 2016-02-19 DIAGNOSIS — I252 Old myocardial infarction: Secondary | ICD-10-CM | POA: Diagnosis not present

## 2016-02-19 DIAGNOSIS — I209 Angina pectoris, unspecified: Secondary | ICD-10-CM | POA: Diagnosis not present

## 2016-02-19 DIAGNOSIS — I10 Essential (primary) hypertension: Secondary | ICD-10-CM | POA: Diagnosis not present

## 2016-02-19 DIAGNOSIS — I2510 Atherosclerotic heart disease of native coronary artery without angina pectoris: Secondary | ICD-10-CM | POA: Diagnosis not present

## 2016-02-19 DIAGNOSIS — R808 Other proteinuria: Secondary | ICD-10-CM | POA: Diagnosis not present

## 2016-02-19 DIAGNOSIS — E668 Other obesity: Secondary | ICD-10-CM | POA: Diagnosis not present

## 2016-02-19 DIAGNOSIS — Z6833 Body mass index (BMI) 33.0-33.9, adult: Secondary | ICD-10-CM | POA: Diagnosis not present

## 2016-02-19 DIAGNOSIS — E1129 Type 2 diabetes mellitus with other diabetic kidney complication: Secondary | ICD-10-CM | POA: Diagnosis not present

## 2016-02-24 ENCOUNTER — Other Ambulatory Visit: Payer: Self-pay | Admitting: *Deleted

## 2016-02-24 ENCOUNTER — Ambulatory Visit (INDEPENDENT_AMBULATORY_CARE_PROVIDER_SITE_OTHER): Payer: Medicare Other | Admitting: Cardiovascular Disease

## 2016-02-24 ENCOUNTER — Other Ambulatory Visit: Payer: Self-pay | Admitting: Cardiovascular Disease

## 2016-02-24 ENCOUNTER — Encounter: Payer: Self-pay | Admitting: Cardiovascular Disease

## 2016-02-24 VITALS — BP 120/60 | HR 65 | Ht 68.0 in | Wt 219.4 lb

## 2016-02-24 DIAGNOSIS — I25119 Atherosclerotic heart disease of native coronary artery with unspecified angina pectoris: Secondary | ICD-10-CM

## 2016-02-24 DIAGNOSIS — I481 Persistent atrial fibrillation: Secondary | ICD-10-CM | POA: Diagnosis not present

## 2016-02-24 DIAGNOSIS — I4891 Unspecified atrial fibrillation: Secondary | ICD-10-CM | POA: Insufficient documentation

## 2016-02-24 DIAGNOSIS — I4819 Other persistent atrial fibrillation: Secondary | ICD-10-CM

## 2016-02-24 MED ORDER — FUROSEMIDE 40 MG PO TABS: 40.0000 mg | ORAL_TABLET | Freq: Every day | ORAL | Status: DC

## 2016-02-24 MED ORDER — APIXABAN 5 MG PO TABS: 5.0000 mg | ORAL_TABLET | Freq: Two times a day (BID) | ORAL | Status: DC

## 2016-02-24 MED ORDER — POTASSIUM CHLORIDE ER 10 MEQ PO TBCR: 10.0000 meq | EXTENDED_RELEASE_TABLET | Freq: Every day | ORAL | Status: DC

## 2016-02-24 NOTE — Telephone Encounter (Signed)
Spoke with patient earlier today at which time he requested his rx be sent to the mail order pharmacy as it would be more cost efficient for him. The local pharmacy is now requesting a refill. I tried to reach the patient to determine if he was actually requesting this from them or if it was simply on automatic refill at the pharmacy. He did not answer. I will send one refill in as he may be needing it to hold him over until the mail order arrives.

## 2016-02-24 NOTE — Progress Notes (Signed)
Cardiology Office Note   Date:  02/24/2016   ID:  [name] DockVernon E Heintzelman, DOB [birth year], MRN [id number]  PCP:  Gaspar Garbeichard W Tisovec, MD  Cardiologist:   Elease HashimotoNahser, Deloris PingPhilip J, MD   Chief Complaint  Patient presents with  . Follow-up    f/u to SOB     Problem List 1. Exertional chest pain  2. DM 2 3. Hyperlipidemia 4. Essential HTN 5. Atrial fib   History of Present Illness: [name] [name] is a 82 y.o. male who presents for evaluation of exertional CP He's been very healthy and very active for many years. 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 He still keeps up with his yard work. This past weekend he was spreading e out on his front and back are. He spent about 160 pounds of grass seed. He noted that he started having some episodes of exertional chest pain. He stopped to rest and the pain resolved. He then resumed his yard work and the pain returned again. Deep left sided chest pain . , associated with dyspnea and leg weakness .  He was able to finish the artery without too much difficulty. Since that time, he's been having some exertional  leg fatigue, and generalized fatigue. He has not had any further chest pain   no dietary restrictions.   Feb. 16, 2017:  No cardiac issues.  Has a cough from burning trash last week.     February 24, 2016  Myoview showed a previous Iff. MI.   EF = 60%  Having more DO E.   Unable to walk very far . He has DOE walking to the mail box but can dig in the garden without too much trouble.  His medical doctor is scheduling him for rehab.   He was in NSR at his previous visits.   Since that time , several ECGs have shown A-fib  Has been on Eliquis   Past Medical History  Diagnosis Date  . DM (diabetes mellitus), type 2 with renal complications (HCC)   . Hypertension   . Hyperlipidemia   . Prostatic hypertrophy, benign, with obstruction   . Sick sinus syndrome (HCC)   . Obesity   . Microalbuminuria   .  Glaucoma   . Renal calculus   . Osteoarthritis   . Chronic kidney disease, stage III (moderate)   . Chest pain   . Microalbuminuria 2013  . BPH with elevated PSA   . ED (erectile dysfunction)     Past Surgical History  Procedure Laterality Date  . Knee surgery    . Replacement total knee  02/26/01     Current Outpatient Prescriptions  Medication Sig Dispense Refill  . albuterol (PROVENTIL HFA;VENTOLIN HFA) 108 (90 Base) MCG/ACT inhaler Inhale 2 puffs into the lungs every 6 (six) hours as needed for wheezing or shortness of breath. 1 Inhaler 2  . alfuzosin (UROXATRAL) 10 MG 24 hr tablet Take 10 mg by mouth daily with breakfast.    . allopurinol (ZYLOPRIM) 100 MG tablet Take 100 mg by mouth daily.    Marland Kitchen. apixaban (ELIQUIS) 5 MG TABS tablet Take 1 tablet (5 mg total) by mouth 2 (two) times daily. 60 tablet 1  . aspirin 81 MG tablet Take 81 mg by mouth daily.    . benzonatate (TESSALON) 100 MG capsule Take 1 capsule (100 mg total) by mouth every 8 (eight) hours. 21 capsule 0  . brinzolamide (AZOPT) 1 %  ophthalmic suspension Place 1 drop into both eyes 2 (two) times daily.    . finasteride (PROSCAR) 5 MG tablet Take 5 mg by mouth daily.  3  . glimepiride (AMARYL) 4 MG tablet Take 4 mg by mouth daily.    . isosorbide mononitrate (IMDUR) 60 MG 24 hr tablet Take 60 mg by mouth daily.    Marland Kitchen losartan (COZAAR) 100 MG tablet Take 100 mg by mouth daily.    . metFORMIN (GLUMETZA) 1000 MG (MOD) 24 hr tablet Take 1,000 mg by mouth daily with breakfast.    . Multiple Vitamins-Minerals (CENTRUM PO) Take 1 tablet by mouth every other day.     Marland Kitchen MYRBETRIQ 50 MG TB24 tablet Take 50 mg by mouth daily.    . nitroGLYCERIN (NITROSTAT) 0.4 MG SL tablet Place 1 tablet (0.4 mg total) under the tongue every 5 (five) minutes as needed for chest pain. 25 tablet 6  . RAPAFLO 8 MG CAPS capsule Take 8 mg by mouth daily.    . saxagliptin HCl (ONGLYZA) 5 MG TABS tablet Take 5 mg by mouth daily.    . simvastatin (ZOCOR)  80 MG tablet Take 40 mg by mouth daily at 6 PM.     . travoprost, benzalkonium, (TRAVATAN) 0.004 % ophthalmic solution Place 1 drop into both eyes at bedtime.     No current facility-administered medications for this visit.    Allergies:   Review of patient's allergies indicates no known allergies.    Social History:  The patient  reports that he has quit smoking. His smoking use included Cigarettes. He does not have any smokeless tobacco history on file. He reports that he does not drink alcohol or use illicit drugs.   Family History:  The patient's family history includes Cancer in his paternal grandfather and sister; Cancer - Colon in his sister; Healthy in his child and child; Other in his child; Stroke in his father.    ROS:  Please see the history of present illness.    Review of Systems: Constitutional:  denies fever, chills, diaphoresis, appetite change and fatigue.  HEENT: denies photophobia, eye pain, redness, hearing loss, ear pain, congestion, sore throat, rhinorrhea, sneezing, neck pain, neck stiffness and tinnitus.  Respiratory: denies SOB, DOE, cough, chest tightness, and wheezing.  Cardiovascular: denies chest pain, palpitations and leg swelling.  Gastrointestinal: denies nausea, vomiting, abdominal pain, diarrhea, constipation, blood in stool.  Genitourinary: denies dysuria, urgency, frequency, hematuria, flank pain and difficulty urinating.  Musculoskeletal: denies  myalgias, back pain, joint swelling, arthralgias and gait problem.   Skin: denies pallor, rash and wound.  Neurological: denies dizziness, seizures, syncope, weakness, light-headedness, numbness and headaches.   Hematological: denies adenopathy, easy bruising, personal or family bleeding history.  Psychiatric/ Behavioral: denies suicidal ideation, mood changes, confusion, nervousness, sleep disturbance and agitation.       All other systems are reviewed and negative.    PHYSICAL EXAM: VS:  BP 120/60  mmHg  Pulse 65  Ht  (1.727 m)  Wt 219 lb 6.4 oz (99.519 kg)  BMI 33.37 kg/m2  SpO2 91% , BMI Body mass index is 33.37 kg/(m^2). GEN: Well nourished, well developed, in no acute distress HEENT: normal Neck: no JVD, carotid bruits, or masses Cardiac: Regular rate with frequent pauses ( has Mobitz I AV block in ECG) ; no murmurs, rubs, or gallops,no edema  Respiratory:   Has wheezes - particularly on the right lower lung field  GI: soft, nontender, nondistended, + BS MS: no  deformity or atrophy Leg pulses are 2+   Skin: warm and dry, no rash Neuro:  Strength and sensation are intact Psych: normal   EKG:  EKG is not ordered today.  Recent Labs: 12/10/2015: ALT 24; BUN 26*; Creatinine, Ser 1.44*; Hemoglobin 13.8; Platelets 116*; Potassium 4.4; Sodium 140    Lipid Panel No results found for: CHOL, TRIG, HDL, CHOLHDL, VLDL, LDLCALC, LDLDIRECT    Wt Readings from Last 3 Encounters:  02/24/16 219 lb 6.4 oz (99.519 kg)  12/17/15 210 lb 1.9 oz (95.31 kg)  12/10/15 215 lb (97.523 kg)      Other studies Reviewed: Additional studies/ records that were reviewed today include: . Review of the above records demonstrates:    ASSESSMENT AND PLAN:  1.  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 2016l.    He remembers having lots of CP while working.    Myoview showed an old Inf. Lat MI.   His EF is normal.   He is not having any further pains at this point.    myoview confirms - inf. MI   At his age of 40 I will treat him rather conservatively. I'm not inclined to recommend heart catheterization  since he is clinically doing so well. I have advised him to call us if he has repeated episodes of chest pain and at that point I would recommend cardiac catheterization.  He has SL NTG to take as needed   2. Exertional leg fatigue:  Still has weakness.  Arterial duplex was normal   3. Atrial fib - on eliquis .    CHADS2VASC =  5   ( age 34, DM, HTN, vasc )   Will get an echo . Will consider cardioversion if his atrial size is not markedly enlarged.    4. Chronic diastolic CHF  Will add Lasix 40 a day and Kdur 10 meq a day . This should help his DOE    Current medicines are reviewed at length with the patient today.  The patient does not have concerns regarding medicines.  The following changes have been made:  no change  Labs/ tests ordered today include:  No orders of the defined types were placed in this encounter.     Disposition:   FU with 3 months      Nahser, Deloris Ping, MD  02/24/2016 9:57 AM    [facility] [address] [place], [place], [place]  [address] Phone: [phone]; Fax: [phone]   [facility]  [address] [place], [place]  [address] [phone]   Fax [phone]

## 2016-02-24 NOTE — Patient Instructions (Signed)
Medication Instructions:  Your physician has recommended you make the following change in your medication:  1. Discontinue Asprin 2. Start Lasix ( 40 mg ) daily 3. Start Potassium ( 10 meq) daily   Labwork: Your physician recommends that you return for lab work in 3 weeks for a bmet   Testing/Procedures: Your physician has requested that you have an echocardiogram. Echocardiography is a painless test that uses sound waves to create images of your heart. It provides your doctor with information about the size and shape of your heart and how well your heart's chambers and valves are working. This procedure takes approximately one hour. There are no restrictions for this procedure.    Follow-Up: Your physician recommends that you keep your scheduled  follow-up appointment with Dr. Elease HashimotoNahser   Any Other Special Instructions Will Be Listed Below (If Applicable).     If you need a refill on your cardiac medications before your next appointment, please call your pharmacy.

## 2016-03-07 ENCOUNTER — Other Ambulatory Visit (INDEPENDENT_AMBULATORY_CARE_PROVIDER_SITE_OTHER): Payer: Medicare Other | Admitting: *Deleted

## 2016-03-07 ENCOUNTER — Other Ambulatory Visit: Payer: Self-pay

## 2016-03-07 ENCOUNTER — Ambulatory Visit (HOSPITAL_COMMUNITY): Payer: Medicare Other | Attending: Internal Medicine

## 2016-03-07 DIAGNOSIS — I4819 Other persistent atrial fibrillation: Secondary | ICD-10-CM

## 2016-03-07 DIAGNOSIS — I481 Persistent atrial fibrillation: Secondary | ICD-10-CM | POA: Insufficient documentation

## 2016-03-07 DIAGNOSIS — I119 Hypertensive heart disease without heart failure: Secondary | ICD-10-CM | POA: Diagnosis not present

## 2016-03-07 DIAGNOSIS — I350 Nonrheumatic aortic (valve) stenosis: Secondary | ICD-10-CM | POA: Insufficient documentation

## 2016-03-07 DIAGNOSIS — E119 Type 2 diabetes mellitus without complications: Secondary | ICD-10-CM | POA: Diagnosis not present

## 2016-03-07 DIAGNOSIS — I4891 Unspecified atrial fibrillation: Secondary | ICD-10-CM | POA: Diagnosis present

## 2016-03-07 LAB — BASIC METABOLIC PANEL
BUN: 33 mg/dL — ABNORMAL HIGH (ref 7–25)
CHLORIDE: 103 mmol/L (ref 98–110)
CO2: 26 mmol/L (ref 20–31)
Calcium: 9.2 mg/dL (ref 8.6–10.3)
Creat: 1.3 mg/dL — ABNORMAL HIGH (ref 0.70–1.11)
GLUCOSE: 172 mg/dL — AB (ref 65–99)
POTASSIUM: 3.9 mmol/L (ref 3.5–5.3)
SODIUM: 139 mmol/L (ref 135–146)

## 2016-03-08 ENCOUNTER — Other Ambulatory Visit: Payer: Self-pay

## 2016-03-09 ENCOUNTER — Other Ambulatory Visit: Payer: Medicare Other

## 2016-04-06 DIAGNOSIS — H401131 Primary open-angle glaucoma, bilateral, mild stage: Secondary | ICD-10-CM | POA: Diagnosis not present

## 2016-04-07 ENCOUNTER — Encounter (HOSPITAL_COMMUNITY)
Admission: RE | Admit: 2016-04-07 | Discharge: 2016-04-07 | Disposition: A | Discharge status: Home / Self Care | Payer: Medicare Other | Source: Ambulatory Visit | Attending: Cardiovascular Disease | Admitting: Cardiovascular Disease

## 2016-04-07 ENCOUNTER — Encounter (HOSPITAL_COMMUNITY): Payer: Self-pay

## 2016-04-07 VITALS — BP 126/60 | HR 60 | Ht 67.5 in | Wt 214.1 lb

## 2016-04-07 DIAGNOSIS — I10 Essential (primary) hypertension: Secondary | ICD-10-CM | POA: Diagnosis not present

## 2016-04-07 DIAGNOSIS — Z7902 Long term (current) use of antithrombotics/antiplatelets: Secondary | ICD-10-CM | POA: Insufficient documentation

## 2016-04-07 DIAGNOSIS — Z79899 Other long term (current) drug therapy: Secondary | ICD-10-CM | POA: Insufficient documentation

## 2016-04-07 DIAGNOSIS — Z87891 Personal history of nicotine dependence: Secondary | ICD-10-CM | POA: Insufficient documentation

## 2016-04-07 DIAGNOSIS — E785 Hyperlipidemia, unspecified: Secondary | ICD-10-CM | POA: Diagnosis not present

## 2016-04-07 DIAGNOSIS — E119 Type 2 diabetes mellitus without complications: Secondary | ICD-10-CM | POA: Insufficient documentation

## 2016-04-07 DIAGNOSIS — I208 Other forms of angina pectoris: Secondary | ICD-10-CM | POA: Diagnosis not present

## 2016-04-07 NOTE — Progress Notes (Signed)
Cardiac Individual Treatment Plan  Patient Details  Name: [name] [name] MRN: [id number] Date of Birth: [birth year] Referring Provider:        CARDIAC REHAB PHASE II ORIENTATION from 04/07/2016 in [facility] CARDIAC REHAB   Referring Provider  Charlton Haws, MD      Initial Encounter Date:       CARDIAC REHAB PHASE II ORIENTATION from 04/07/2016 in [facility] CARDIAC REHAB   Date  04/07/16   Referring Provider  Charlton Haws, MD      Visit Diagnosis: Stable angina [facility])  Patient's Home Medications on Admission:  Current outpatient prescriptions:  .  alfuzosin (UROXATRAL) 10 MG 24 hr tablet, Take 10 mg by mouth daily with breakfast., Disp: , Rfl:  .  allopurinol (ZYLOPRIM) 100 MG tablet, Take 100 mg by mouth daily., Disp: , Rfl:  .  brinzolamide (AZOPT) 1 % ophthalmic suspension, Place 1 drop into both eyes 2 (two) times daily., Disp: , Rfl:  .  ELIQUIS 5 MG TABS tablet, TAKE 1 TABLET BY MOUTH TWICE A DAY, Disp: 60 tablet, Rfl: 0 .  finasteride (PROSCAR) 5 MG tablet, Take 5 mg by mouth daily., Disp: , Rfl: 3 .  furosemide (LASIX) 40 MG tablet, Take 1 tablet (40 mg total) by mouth daily., Disp: 30 tablet, Rfl: 6 .  glimepiride (AMARYL) 4 MG tablet, Take 4 mg by mouth daily., Disp: , Rfl:  .  isosorbide mononitrate (IMDUR) 60 MG 24 hr tablet, Take 60 mg by mouth daily., Disp: , Rfl:  .  losartan (COZAAR) 100 MG tablet, Take 100 mg by mouth daily., Disp: , Rfl:  .  metFORMIN (GLUMETZA) 1000 MG (MOD) 24 hr tablet, Take 1,000 mg by mouth daily with breakfast., Disp: , Rfl:  .  Multiple Vitamins-Minerals (CENTRUM PO), Take 1 tablet by mouth every other day. , Disp: , Rfl:  .  MYRBETRIQ 50 MG TB24 tablet, Take 50 mg by mouth daily., Disp: , Rfl:  .  nitroGLYCERIN (NITROSTAT) 0.4 MG SL tablet, Place 1 tablet (0.4 mg total) under the tongue every 5 (five) minutes as needed for chest pain., Disp: 25 tablet, Rfl: 6 .  potassium chloride (K-DUR) 10 MEQ tablet,  Take 1 tablet (10 mEq total) by mouth daily., Disp: 30 tablet, Rfl: 6 .  RAPAFLO 8 MG CAPS capsule, Take 8 mg by mouth daily., Disp: , Rfl:  .  saxagliptin HCl (ONGLYZA) 5 MG TABS tablet, Take 5 mg by mouth daily., Disp: , Rfl:  .  simvastatin (ZOCOR) 80 MG tablet, Take 40 mg by mouth daily at 6 PM. , Disp: , Rfl:  .  travoprost, benzalkonium, (TRAVATAN) 0.004 % ophthalmic solution, Place 1 drop into both eyes at bedtime., Disp: , Rfl:   Past Medical History: Past Medical History  Diagnosis Date  . DM (diabetes mellitus), type 2 with renal complications (HCC)   . Hypertension   . Hyperlipidemia   . Prostatic hypertrophy, benign, with obstruction   . Sick sinus syndrome (HCC)   . Obesity   . Microalbuminuria   . Glaucoma   . Renal calculus   . Osteoarthritis   . Chronic kidney disease, stage III (moderate)   . Chest pain   . Microalbuminuria 2013  . BPH with elevated PSA   . ED (erectile dysfunction)     Tobacco Use: History  Smoking status  . Former Smoker  . Types: Cigarettes  Smokeless tobacco  . Not on file    Labs: Recent  Review Flowsheet Data    There is no flowsheet data to display.      Capillary Blood Glucose: No results found for: GLUCAP   Exercise Target Goals: Date: 04/07/16  Exercise Program Goal: Individual exercise prescription set with THRR, safety & activity barriers. Participant demonstrates ability to understand and report RPE using BORG scale, to self-measure pulse accurately, and to acknowledge the importance of the exercise prescription.  Exercise Prescription Goal: Starting with aerobic activity 30 plus minutes a day, 3 days per week for initial exercise prescription. Provide home exercise prescription and guidelines that participant acknowledges understanding prior to discharge.  Activity Barriers & Risk Stratification:     Activity Barriers & Cardiac Risk Stratification - 04/07/16 0827    Activity Barriers & Cardiac Risk Stratification    Activity Barriers Left Knee Replacement   Cardiac Risk Stratification High      6 Minute Walk:     6 Minute Walk      04/07/16 1018 04/07/16 1118     6 Minute Walk   Phase Initial Initial    Distance  856 feet    Walk Time 6 minutes 6 minutes    # of Rest Breaks 0 0    MPH  1.6    METS  0.5    RPE  11    VO2 Peak  1.8    Symptoms  No    Resting HR 60 bpm     Resting BP 126/60 mmHg     Max Ex. HR 71 bpm     Max Ex. BP 126/62 mmHg     2 Minute Post BP 122/68 mmHg        Initial Exercise Prescription:     Initial Exercise Prescription - 04/07/16 1100    Date of Initial Exercise RX and Referring Provider   Date 04/07/16   Referring Provider Charlton Haws, MD   NuStep   Level 1   Minutes 15   METs 1.2   Arm Ergometer   Level 2   Watts 15   Minutes 15   METs 1   Prescription Details   Frequency (times per week) 3   Duration Progress to 30 minutes of continuous aerobic without signs/symptoms of physical distress   Intensity   THRR 40-80% of Max Heartrate 53-106   Ratings of Perceived Exertion 11-13   Perceived Dyspnea 0-4   Progression   Progression Continue to progress workloads to maintain intensity without signs/symptoms of physical distress.   Resistance Training   Training Prescription Yes   Weight 1   Reps 10-12      Perform Capillary Blood Glucose checks as needed.  Exercise Prescription Changes:   Exercise Comments:   Discharge Exercise Prescription (Final Exercise Prescription Changes):   Nutrition:  Target Goals: Understanding of nutrition guidelines, daily intake of sodium 1500mg , cholesterol 200mg , calories 30% from fat and 7% or less from saturated fats, daily to have 5 or more servings of fruits and vegetables.  Biometrics:     Pre Biometrics - 04/07/16 1116    Pre Biometrics   % Body Fat 33.5 %       Nutrition Therapy Plan and Nutrition Goals:   Nutrition Discharge: Nutrition Scores:   Nutrition Goals  Re-Evaluation:   Psychosocial: Target Goals: Acknowledge presence or absence of depression, maximize coping skills, provide positive support system. Participant is able to verbalize types and ability to use techniques and skills needed for reducing stress and depression.  Initial Review & Psychosocial  Screening:   Quality of Life Scores:     Quality of Life - 04/07/16 1122    Quality of Life Scores   Health/Function Pre 29.75 %   Socioeconomic Pre 29.83 %   Psych/Spiritual Pre 30 %   Family Pre 30 %   GLOBAL Pre 28.8 %      PHQ-9:     Recent Review Flowsheet Data    There is no flowsheet data to display.      Psychosocial Evaluation and Intervention:   Psychosocial Re-Evaluation:   Vocational Rehabilitation: Provide vocational rehab assistance to qualifying candidates.   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 Participant will state understanding/return demonstration of topics presented.  Learning Barriers/Preferences:     Learning Barriers/Preferences - 04/07/16 0827    Learning Barriers/Preferences   Learning Barriers Sight   Learning Preferences Written Material;Pictoral      Education Topics: Count Your Pulse:  -Group instruction provided by verbal instruction, demonstration, patient participation and written materials to support subject.  Instructors address importance of being able to find your pulse and how to count your pulse when at home without a heart monitor.  Patients get hands on experience counting their pulse with staff help and individually.   Heart Attack, Angina, and Risk Factor Modification:  -Group instruction  provided by verbal instruction, video, and written materials to support subject.  Instructors address signs and symptoms of angina and heart attacks.    Also discuss risk factors for heart disease and how to make changes to improve heart health risk factors.   Functional Fitness:  -Group instruction provided by verbal instruction, demonstration, patient participation, and written materials to support subject.  Instructors address safety measures for doing things around the house.  Discuss how to get up and down off the floor, how to pick things up properly, how to safely get out of a chair without assistance, and balance training.   Meditation and Mindfulness:  -Group instruction provided by verbal instruction, patient participation, and written materials to support subject.  Instructor addresses importance of mindfulness and meditation practice to help reduce stress and improve awareness.  Instructor also leads participants through a meditation exercise.    Stretching for Flexibility and Mobility:  -Group instruction provided by verbal instruction, patient participation, and written materials to support subject.  Instructors lead participants through series of stretches that are designed to increase flexibility thus improving mobility.  These stretches are additional exercise for major muscle groups that are typically performed during regular warm up and cool down.   Hands Only CPR Anytime:  -Group instruction provided by verbal instruction, video, patient participation and written materials to support subject.  Instructors co-teach with AHA video for hands only CPR.  Participants get hands on experience with mannequins.   Nutrition I class: Heart Healthy Eating:  -Group instruction provided by PowerPoint slides, verbal discussion, and written materials to support subject matter. The instructor gives an explanation and review of the Therapeutic Lifestyle Changes diet recommendations, which  includes a discussion on lipid goals, dietary fat, sodium, fiber, plant stanol/sterol esters, sugar, and the components of a well-balanced, healthy diet.   Nutrition II class: Lifestyle Skills:  -  Group instruction provided by PowerPoint slides, verbal discussion, and written materials to support subject matter. The instructor gives an explanation and review of label reading, grocery shopping for heart health, heart healthy recipe modifications, and ways to make healthier choices when eating out.   Diabetes Question & Answer:  -Group instruction provided by PowerPoint slides, verbal discussion, and written materials to support subject matter. The instructor gives an explanation and review of diabetes co-morbidities, pre- and post-prandial blood glucose goals, pre-exercise blood glucose goals, signs, symptoms, and treatment of hypoglycemia and hyperglycemia, and foot care basics.   Diabetes Blitz:  -Group instruction provided by PowerPoint slides, verbal discussion, and written materials to support subject matter. The instructor gives an explanation and review of the physiology behind type 1 and type 2 diabetes, diabetes medications and rational behind using different medications, pre- and post-prandial blood glucose recommendations and Hemoglobin A1c goals, diabetes diet, and exercise including blood glucose guidelines for exercising safely.    Portion Distortion:  -Group instruction provided by PowerPoint slides, verbal discussion, written materials, and food models to support subject matter. The instructor gives an explanation of serving size versus portion size, changes in portions sizes over the last 20 years, and what consists of a serving from each food group.   Stress Management:  -Group instruction provided by verbal instruction, video, and written materials to support subject matter.  Instructors review role of stress in heart disease and how to cope with stress positively.      Exercising on Your Own:  -Group instruction provided by verbal instruction, power point, and written materials to support subject.  Instructors discuss benefits of exercise, components of exercise, frequency and intensity of exercise, and end points for exercise.  Also discuss use of nitroglycerin and activating EMS.  Review options of places to exercise outside of rehab.  Review guidelines for sex with heart disease.   Cardiac Drugs I:  -Group instruction provided by verbal instruction and written materials to support subject.  Instructor reviews cardiac drug classes: antiplatelets, anticoagulants, beta blockers, and statins.  Instructor discusses reasons, side effects, and lifestyle considerations for each drug class.   Cardiac Drugs II:  -Group instruction provided by verbal instruction and written materials to support subject.  Instructor reviews cardiac drug classes: angiotensin converting enzyme inhibitors (ACE-I), angiotensin II receptor blockers (ARBs), nitrates, and calcium channel blockers.  Instructor discusses reasons, side effects, and lifestyle considerations for each drug class.   Anatomy and Physiology of the Circulatory System:  -Group instruction provided by verbal instruction, video, and written materials to support subject.  Reviews functional anatomy of heart, how it relates to various diagnoses, and what role the heart plays in the overall system.   Knowledge Questionnaire Score:     Knowledge Questionnaire Score - 04/07/16 1116    Knowledge Questionnaire Score   Pre Score 20/24      Core Components/Risk Factors/Patient Goals at Admission:     Personal Goals and Risk Factors at Admission - 04/07/16 0827    Core Components/Risk Factors/Patient Goals on Admission    Weight Management Yes   Intervention Weight Management: Develop a combined nutrition and exercise program designed to reach desired caloric intake, while maintaining appropriate intake of nutrient  and fiber, sodium and fats, and appropriate energy expenditure required for the weight goal.;Weight Management: Provide education and appropriate resources to help participant work on and attain dietary goals.;Obesity: Provide education and appropriate resources to help participant work on and attain dietary goals.;Weight Management/Obesity: Establish reasonable short term  and long term weight goals.   Expected Outcomes Short Term: Continue to assess and modify interventions until short term weight is achieved;Long Term: Adherence to nutrition and physical activity/exercise program aimed toward attainment of established weight goal;Weight Maintenance: Understanding of the daily nutrition guidelines, which includes 25-35% calories from fat, 7% or less cal from saturated fats, less than 200mg  cholesterol, less than 1.5gm of sodium, & 5 or more servings of fruits and vegetables daily;Weight Loss: Understanding of general recommendations for a balanced deficit meal plan, which promotes 1-2 lb weight loss per week and includes a negative energy balance of [phone] kcal/d;Understanding recommendations for meals to include 15-35% energy as protein, 25-35% energy from fat, 35-60% energy from carbohydrates, less than 200mg  of dietary cholesterol, 20-35 gm of total fiber daily;Understanding of distribution of calorie intake throughout the day with the consumption of 4-5 meals/snacks   Sedentary Yes   Intervention Provide advice, education, support and counseling about physical activity/exercise needs.;Develop an individualized exercise prescription for aerobic and resistive training based on initial evaluation findings, risk stratification, comorbidities and participant's personal goals.   Expected Outcomes Achievement of increased cardiorespiratory fitness and enhanced flexibility, muscular endurance and strength shown through measurements of functional capacity and personal statement of participant.   Increase Strength  and Stamina Yes   Intervention Provide advice, education, support and counseling about physical activity/exercise needs.;Develop an individualized exercise prescription for aerobic and resistive training based on initial evaluation findings, risk stratification, comorbidities and participant's personal goals.   Expected Outcomes Achievement of increased cardiorespiratory fitness and enhanced flexibility, muscular endurance and strength shown through measurements of functional capacity and personal statement of participant.   Improve shortness of breath with ADL's Yes   Intervention Provide education, individualized exercise plan and daily activity instruction to help decrease symptoms of SOB with activities of daily living.   Expected Outcomes Short Term: Achieves a reduction of symptoms when performing activities of daily living.   Diabetes Yes   Intervention Provide education about proper nutrition, including hydration, and aerobic/resistive exercise prescription along with prescribed medications to achieve blood glucose in normal ranges: Fasting glucose 65-99 mg/dL;Provide education about signs/symptoms and action to take for hypo/hyperglycemia.   Expected Outcomes Short Term: Participant verbalizes understanding of the signs/symptoms and immediate care of hyper/hypoglycemia, proper foot care and importance of medication, aerobic/resistive exercise and nutrition plan for blood glucose control.   Personal Goal Other Yes   Personal Goal short: get back to yardwork, gardening and planting  long: be able to walk longer distances with less SOB   Intervention provide exercising programming to increase endurance and stamina to assist with gardening and walking longer distances   Expected Outcomes be able to garden and plant and walk further      Core Components/Risk Factors/Patient Goals Review:    Core Components/Risk Factors/Patient Goals at Discharge (Final Review):    ITP Comments:     ITP  Comments      04/07/16 0800           ITP Comments Dr. Armanda Magic, Medical Director          Comments:  Pt in today for cardiac rehab orientation 0800 to 1015.  As a part of cardiac rehab orientation, pt completed a 6 minute walk test.  Pt used rolling wheelchair for stability.  Pt ambulates slowly and did not require any rest breaks. Monitor showed afib which pt has a history and is present on the most recent 12 lead ekg 12/10/2015. Sundiata Ferrick Industrial/product designer,  BSN

## 2016-04-07 NOTE — Progress Notes (Signed)
Cardiac Rehab Medication Review by a Pharmacist  Does the patient  feel that his/her medications are working for him/her?  yes  Has the patient been experiencing any side effects to the medications prescribed?  no  Does the patient measure his/her own blood pressure or blood glucose at home?  Measures Blood Glucose.  States his SMBG are 90-200   Does the patient have any problems obtaining medications due to transportation or finances?   no  Understanding of regimen: fair Understanding of indications: fair Potential of compliance: fair    Pharmacist comments: 82 YO male presenting for cardiac rehab. His understanding of his medications may be limited by his hearing. He states adherence to his regimen.  He denies s/sx of bleeding on Eliquis. He was able to state how he took most of his medications.     [name] [name] 04/07/2016 8:16 AM

## 2016-04-11 ENCOUNTER — Encounter (HOSPITAL_COMMUNITY)
Admission: RE | Admit: 2016-04-11 | Discharge: 2016-04-11 | Disposition: A | Discharge status: Home / Self Care | Payer: Medicare Other | Source: Ambulatory Visit | Attending: Cardiovascular Disease | Admitting: Cardiovascular Disease

## 2016-04-11 DIAGNOSIS — I208 Other forms of angina pectoris: Secondary | ICD-10-CM | POA: Diagnosis not present

## 2016-04-11 DIAGNOSIS — Z79899 Other long term (current) drug therapy: Secondary | ICD-10-CM | POA: Diagnosis not present

## 2016-04-11 DIAGNOSIS — Z7902 Long term (current) use of antithrombotics/antiplatelets: Secondary | ICD-10-CM | POA: Diagnosis not present

## 2016-04-11 DIAGNOSIS — E785 Hyperlipidemia, unspecified: Secondary | ICD-10-CM | POA: Diagnosis not present

## 2016-04-11 DIAGNOSIS — E119 Type 2 diabetes mellitus without complications: Secondary | ICD-10-CM | POA: Diagnosis not present

## 2016-04-11 DIAGNOSIS — I10 Essential (primary) hypertension: Secondary | ICD-10-CM | POA: Diagnosis not present

## 2016-04-11 LAB — GLUCOSE, CAPILLARY: GLUCOSE-CAPILLARY: 153 mg/dL — AB (ref 65–99)

## 2016-04-11 NOTE — Progress Notes (Addendum)
Daily Session Note  Patient Details  Name: [name] [name] MRN: [id number] Date of Birth: 01/02/1925 Referring Provider:        CARDIAC REHAB PHASE II ORIENTATION from 04/07/2016 in [facility] CARDIAC REHAB   Referring Provider  Nishan, Peter, MD      Encounter Date: 04/11/2016  Check In:     Session Check In - 04/11/16 0846    Check-In   Location [facility]   Staff Present Carlette Carlton, RN, BSN;Portia Payne, RN, BSN;Amber Fair, MS, ACSM RCEP, Exercise Physiologist; , RN, BSN   Supervising physician immediately available to respond to emergencies [facility] immediately available   Physician(s) Dr. Merrell   Medication changes reported     No   Fall or balance concerns reported    No   Warm-up and Cool-down Performed as group-led instruction   Resistance Training Performed Yes   VAD Patient? No   Pain Assessment   Currently in Pain? No/denies      Capillary Blood Glucose: Results for orders placed or performed during the hospital encounter of 04/11/16 (from the past 24 hour(s))  Glucose, capillary     Status: Abnormal   Collection Time: 04/11/16  9:06 AM  Result Value Ref Range   Glucose-Capillary 153 (H) 65 - 99 mg/dL     Goals Met:  Exercise tolerated well  Goals Unmet:  Not Applicable  Comments: Pt started cardiac rehab today.  Pt tolerated light exercise without difficulty. VSS, telemetry-atrial fibrillation, baseline artifact, asymptomatic.  Medication list reconciled. Pt denies barriers to medicaiton compliance.  PSYCHOSOCIAL ASSESSMENT:  PHQ-0. Pt exhibits positive coping skills, hopeful outlook with supportive family. No psychosocial needs identified at this time, no psychosocial interventions necessary.    Pt enjoys gardening, vegetable and flowers.  In fact, pt admits to using heavy equipment such as tillers and carrying large bags of grass seed. Pt also admits to frequent falls while gardening however  reports he does not fall in the house or other flat surfaces .   Pt oriented to exercise equipment and routine.    Understanding verbalized.    Dr. Traci Turner is Medical Director for Cardiac Rehab at [facility]. 

## 2016-04-13 ENCOUNTER — Encounter (HOSPITAL_COMMUNITY): Admission: RE | Admit: 2016-04-13 | Payer: Medicare Other | Source: Ambulatory Visit

## 2016-04-15 ENCOUNTER — Encounter (HOSPITAL_COMMUNITY)
Admission: RE | Admit: 2016-04-15 | Discharge: 2016-04-15 | Disposition: A | Discharge status: Home / Self Care | Payer: Medicare Other | Source: Ambulatory Visit | Attending: Cardiovascular Disease | Admitting: Cardiovascular Disease

## 2016-04-15 DIAGNOSIS — Z7902 Long term (current) use of antithrombotics/antiplatelets: Secondary | ICD-10-CM | POA: Diagnosis not present

## 2016-04-15 DIAGNOSIS — I208 Other forms of angina pectoris: Secondary | ICD-10-CM | POA: Diagnosis not present

## 2016-04-15 DIAGNOSIS — I10 Essential (primary) hypertension: Secondary | ICD-10-CM | POA: Diagnosis not present

## 2016-04-15 DIAGNOSIS — E785 Hyperlipidemia, unspecified: Secondary | ICD-10-CM | POA: Diagnosis not present

## 2016-04-15 DIAGNOSIS — Z79899 Other long term (current) drug therapy: Secondary | ICD-10-CM | POA: Diagnosis not present

## 2016-04-15 DIAGNOSIS — E119 Type 2 diabetes mellitus without complications: Secondary | ICD-10-CM | POA: Diagnosis not present

## 2016-04-15 LAB — GLUCOSE, CAPILLARY: Glucose-Capillary: 191 mg/dL — ABNORMAL HIGH (ref 65–99)

## 2016-04-18 ENCOUNTER — Encounter (HOSPITAL_COMMUNITY)
Admission: RE | Admit: 2016-04-18 | Discharge: 2016-04-18 | Disposition: A | Discharge status: Home / Self Care | Payer: Medicare Other | Source: Ambulatory Visit | Attending: Cardiovascular Disease | Admitting: Cardiovascular Disease

## 2016-04-18 DIAGNOSIS — E119 Type 2 diabetes mellitus without complications: Secondary | ICD-10-CM | POA: Diagnosis not present

## 2016-04-18 DIAGNOSIS — Z79899 Other long term (current) drug therapy: Secondary | ICD-10-CM | POA: Diagnosis not present

## 2016-04-18 DIAGNOSIS — I10 Essential (primary) hypertension: Secondary | ICD-10-CM | POA: Diagnosis not present

## 2016-04-18 DIAGNOSIS — I208 Other forms of angina pectoris: Secondary | ICD-10-CM

## 2016-04-18 DIAGNOSIS — Z7902 Long term (current) use of antithrombotics/antiplatelets: Secondary | ICD-10-CM | POA: Diagnosis not present

## 2016-04-18 DIAGNOSIS — E785 Hyperlipidemia, unspecified: Secondary | ICD-10-CM | POA: Diagnosis not present

## 2016-04-18 LAB — GLUCOSE, CAPILLARY: Glucose-Capillary: 225 mg/dL — ABNORMAL HIGH (ref 65–99)

## 2016-04-20 ENCOUNTER — Encounter (HOSPITAL_COMMUNITY)
Admission: RE | Admit: 2016-04-20 | Discharge: 2016-04-20 | Disposition: A | Discharge status: Home / Self Care | Payer: Medicare Other | Source: Ambulatory Visit | Attending: Cardiovascular Disease | Admitting: Cardiovascular Disease

## 2016-04-20 DIAGNOSIS — I10 Essential (primary) hypertension: Secondary | ICD-10-CM | POA: Diagnosis not present

## 2016-04-20 DIAGNOSIS — Z7902 Long term (current) use of antithrombotics/antiplatelets: Secondary | ICD-10-CM | POA: Diagnosis not present

## 2016-04-20 DIAGNOSIS — I208 Other forms of angina pectoris: Secondary | ICD-10-CM | POA: Diagnosis not present

## 2016-04-20 DIAGNOSIS — E119 Type 2 diabetes mellitus without complications: Secondary | ICD-10-CM | POA: Diagnosis not present

## 2016-04-20 DIAGNOSIS — Z79899 Other long term (current) drug therapy: Secondary | ICD-10-CM | POA: Diagnosis not present

## 2016-04-20 DIAGNOSIS — E785 Hyperlipidemia, unspecified: Secondary | ICD-10-CM | POA: Diagnosis not present

## 2016-04-20 LAB — GLUCOSE, CAPILLARY: GLUCOSE-CAPILLARY: 138 mg/dL — AB (ref 65–99)

## 2016-04-20 NOTE — Progress Notes (Signed)
Cardiac Individual Treatment Plan  Patient Details  Name: RAHEEN CAPILI MRN: [id number] Date of Birth: 1925-08-10 Referring Provider:        CARDIAC REHAB PHASE II ORIENTATION from 04/07/2016 in [facility] CARDIAC REHAB   Referring Provider  Charlton Haws, MD      Initial Encounter Date:       CARDIAC REHAB PHASE II ORIENTATION from 04/07/2016 in [facility] CARDIAC REHAB   Date  04/07/16   Referring Provider  Charlton Haws, MD      Visit Diagnosis: Stable angina [facility])  Patient's Home Medications on Admission:  Current outpatient prescriptions:  .  alfuzosin (UROXATRAL) 10 MG 24 hr tablet, Take 10 mg by mouth daily with breakfast., Disp: , Rfl:  .  allopurinol (ZYLOPRIM) 100 MG tablet, Take 100 mg by mouth daily., Disp: , Rfl:  .  brinzolamide (AZOPT) 1 % ophthalmic suspension, Place 1 drop into both eyes 2 (two) times daily., Disp: , Rfl:  .  ELIQUIS 5 MG TABS tablet, TAKE 1 TABLET BY MOUTH TWICE A DAY, Disp: 60 tablet, Rfl: 0 .  finasteride (PROSCAR) 5 MG tablet, Take 5 mg by mouth daily., Disp: , Rfl: 3 .  furosemide (LASIX) 40 MG tablet, Take 1 tablet (40 mg total) by mouth daily., Disp: 30 tablet, Rfl: 6 .  glimepiride (AMARYL) 4 MG tablet, Take 4 mg by mouth daily., Disp: , Rfl:  .  isosorbide mononitrate (IMDUR) 60 MG 24 hr tablet, Take 60 mg by mouth daily., Disp: , Rfl:  .  losartan (COZAAR) 100 MG tablet, Take 100 mg by mouth daily., Disp: , Rfl:  .  metFORMIN (GLUMETZA) 1000 MG (MOD) 24 hr tablet, Take 1,000 mg by mouth daily with breakfast., Disp: , Rfl:  .  Multiple Vitamins-Minerals (CENTRUM PO), Take 1 tablet by mouth every other day. , Disp: , Rfl:  .  MYRBETRIQ 50 MG TB24 tablet, Take 50 mg by mouth daily., Disp: , Rfl:  .  nitroGLYCERIN (NITROSTAT) 0.4 MG SL tablet, Place 1 tablet (0.4 mg total) under the tongue every 5 (five) minutes as needed for chest pain., Disp: 25 tablet, Rfl: 6 .  potassium chloride (K-DUR) 10 MEQ tablet,  Take 1 tablet (10 mEq total) by mouth daily., Disp: 30 tablet, Rfl: 6 .  RAPAFLO 8 MG CAPS capsule, Take 8 mg by mouth daily., Disp: , Rfl:  .  saxagliptin HCl (ONGLYZA) 5 MG TABS tablet, Take 5 mg by mouth daily., Disp: , Rfl:  .  simvastatin (ZOCOR) 80 MG tablet, Take 40 mg by mouth daily at 6 PM. , Disp: , Rfl:  .  travoprost, benzalkonium, (TRAVATAN) 0.004 % ophthalmic solution, Place 1 drop into both eyes at bedtime., Disp: , Rfl:   Past Medical History: Past Medical History  Diagnosis Date  . DM (diabetes mellitus), type 2 with renal complications (HCC)   . Hypertension   . Hyperlipidemia   . Prostatic hypertrophy, benign, with obstruction   . Sick sinus syndrome (HCC)   . Obesity   . Microalbuminuria   . Glaucoma   . Renal calculus   . Osteoarthritis   . Chronic kidney disease, stage III (moderate)   . Chest pain   . Microalbuminuria 2013  . BPH with elevated PSA   . ED (erectile dysfunction)     Tobacco Use: History  Smoking status  . Former Smoker  . Types: Cigarettes  Smokeless tobacco  . Not on file    Labs:  Recent Review Flowsheet Data    There is no flowsheet data to display.      Capillary Blood Glucose: Lab Results  Component Value Date   GLUCAP 138* 04/20/2016   GLUCAP 225* 04/18/2016   GLUCAP 191* 04/15/2016   GLUCAP 153* 04/11/2016     Exercise Target Goals:    Exercise Program Goal: Individual exercise prescription set with THRR, safety & activity barriers. Participant demonstrates ability to understand and report RPE using BORG scale, to self-measure pulse accurately, and to acknowledge the importance of the exercise prescription.  Exercise Prescription Goal: Starting with aerobic activity 30 plus minutes a day, 3 days per week for initial exercise prescription. Provide home exercise prescription and guidelines that participant acknowledges understanding prior to discharge.  Activity Barriers & Risk Stratification:     Activity  Barriers & Cardiac Risk Stratification - 04/07/16 0827    Activity Barriers & Cardiac Risk Stratification   Activity Barriers Left Knee Replacement   Cardiac Risk Stratification High      6 Minute Walk:     6 Minute Walk      04/07/16 1018 04/07/16 1118     6 Minute Walk   Phase Initial Initial    Distance  856 feet    Walk Time 6 minutes 6 minutes    # of Rest Breaks 0 0    MPH  1.6    METS  0.5    RPE  11    VO2 Peak  1.8    Symptoms  No    Resting HR 60 bpm     Resting BP 126/60 mmHg     Max Ex. HR 71 bpm     Max Ex. BP 126/62 mmHg     2 Minute Post BP 122/68 mmHg        Initial Exercise Prescription:     Initial Exercise Prescription - 04/07/16 1100    Date of Initial Exercise RX and Referring Provider   Date 04/07/16   Referring Provider Charlton Haws, MD   NuStep   Level 1   Minutes 15   METs 1.2   Arm Ergometer   Level 2   Watts 15   Minutes 15   METs 1   Prescription Details   Frequency (times per week) 3   Duration Progress to 30 minutes of continuous aerobic without signs/symptoms of physical distress   Intensity   THRR 40-80% of Max Heartrate 53-106   Ratings of Perceived Exertion 11-13   Perceived Dyspnea 0-4   Progression   Progression Continue to progress workloads to maintain intensity without signs/symptoms of physical distress.   Resistance Training   Training Prescription Yes   Weight 1   Reps 10-12      Perform Capillary Blood Glucose checks as needed.  Exercise Prescription Changes:   Exercise Comments:   Discharge Exercise Prescription (Final Exercise Prescription Changes):   Nutrition:  Target Goals: Understanding of nutrition guidelines, daily intake of sodium 1500mg , cholesterol 200mg , calories 30% from fat and 7% or less from saturated fats, daily to have 5 or more servings of fruits and vegetables.  Biometrics:     Pre Biometrics - 04/07/16 1116    Pre Biometrics   % Body Fat 33.5 %       Nutrition  Therapy Plan and Nutrition Goals:     Nutrition Therapy & Goals - 04/11/16 1520    Nutrition Therapy   Diet Diabetic, Therapeutic Lifestyle Changes   Personal Nutrition Goals  Personal Goal #1 0.5-2 lb wt loss per week to a wt loss goal of 6-10 lb at graduation from Cardiac Rehab   Intervention Plan   Intervention Prescribe, educate and counsel regarding individualized specific dietary modifications aiming towards targeted core components such as weight, hypertension, lipid management, diabetes, heart failure and other comorbidities.   Expected Outcomes Short Term Goal: Understand basic principles of dietary content, such as calories, fat, sodium, cholesterol and nutrients.;Long Term Goal: Adherence to prescribed nutrition plan.      Nutrition Discharge: Nutrition Scores:     Nutrition Assessments - 04/11/16 1510    MEDFICTS Scores   Pre Score 48      Nutrition Goals Re-Evaluation:   Psychosocial: Target Goals: Acknowledge presence or absence of depression, maximize coping skills, provide positive support system. Participant is able to verbalize types and ability to use techniques and skills needed for reducing stress and depression.  Initial Review & Psychosocial Screening:     Initial Psych Review & Screening - 04/11/16 1720    Family Dynamics   Good Support System? Yes   Barriers   Psychosocial barriers to participate in program The patient should benefit from training in stress management and relaxation.   Screening Interventions   Interventions Encouraged to exercise      Quality of Life Scores:     Quality of Life - 04/07/16 1122    Quality of Life Scores   Health/Function Pre 29.75 %   Socioeconomic Pre 29.83 %   Psych/Spiritual Pre 30 %   Family Pre 30 %   GLOBAL Pre 28.8 %      PHQ-9:     Recent Review Flowsheet Data    There is no flowsheet data to display.      Psychosocial Evaluation and Intervention:     Psychosocial Evaluation -  04/20/16 1535    Psychosocial Evaluation & Interventions   Interventions Stress management education;Relaxation education;Encouraged to exercise with the program and follow exercise prescription   Continued Psychosocial Services Needed Yes      Psychosocial Re-Evaluation:   Vocational Rehabilitation: Provide vocational rehab assistance to qualifying candidates.   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 Participant will state understanding/return demonstration of topics presented.  Learning Barriers/Preferences:     Learning Barriers/Preferences - 04/07/16 0827    Learning Barriers/Preferences   Learning Barriers Sight   Learning Preferences Written Material;Pictoral      Education Topics: Count Your Pulse:  -Group instruction provided by verbal instruction, demonstration, patient participation and written materials to support subject.  Instructors address importance of being able to find your pulse and how to count your pulse when at home without a heart monitor.  Patients get hands on experience counting their pulse with staff help and individually.   Heart Attack, Angina, and Risk Factor Modification:  -Group instruction provided by verbal instruction, video, and written materials to support subject.  Instructors address signs and symptoms of angina and heart attacks.    Also discuss risk factors for heart disease and how to make changes to improve heart health risk factors.   Functional Fitness:  -Group instruction provided by verbal instruction, demonstration, patient participation, and written  materials to support subject.  Instructors address  safety measures for doing things around the house.  Discuss how to get up and down off the floor, how to pick things up properly, how to safely get out of a chair without assistance, and balance training.   Meditation and Mindfulness:  -Group instruction provided by verbal instruction, patient participation, and written materials to support subject.  Instructor addresses importance of mindfulness and meditation practice to help reduce stress and improve awareness.  Instructor also leads participants through a meditation exercise.    Stretching for Flexibility and Mobility:  -Group instruction provided by verbal instruction, patient participation, and written materials to support subject.  Instructors lead participants through series of stretches that are designed to increase flexibility thus improving mobility.  These stretches are additional exercise for major muscle groups that are typically performed during regular warm up and cool down.      CARDIAC REHAB PHASE II EXERCISE from 04/20/2016 in [facility] CARDIAC REHAB   Date  04/20/16   Instruction Review Code  2- meets goals/outcomes      Hands Only CPR Anytime:  -Group instruction provided by verbal instruction, video, patient participation and written materials to support subject.  Instructors co-teach with AHA video for hands only CPR.  Participants get hands on experience with mannequins.          CARDIAC REHAB PHASE II EXERCISE from 04/20/2016 in [facility] CARDIAC REHAB   Date  04/15/16   Instruction Review Code  2- meets goals/outcomes      Nutrition I class: Heart Healthy Eating:  -Group instruction provided by PowerPoint slides, verbal discussion, and written materials to support subject matter. The instructor gives an explanation and review of the Therapeutic Lifestyle Changes diet recommendations, which includes a discussion on lipid goals, dietary fat, sodium, fiber, plant stanol/sterol  esters, sugar, and the components of a well-balanced, healthy diet.   Nutrition II class: Lifestyle Skills:  -Group instruction provided by PowerPoint slides, verbal discussion, and written materials to support subject matter. The instructor gives an explanation and review of label reading, grocery shopping for heart health, heart healthy recipe modifications, and ways to make healthier choices when eating out.   Diabetes Question & Answer:  -Group instruction provided by PowerPoint slides, verbal discussion, and written materials to support subject matter. The instructor gives an explanation and review of diabetes co-morbidities, pre- and post-prandial blood glucose goals, pre-exercise blood glucose goals, signs, symptoms, and treatment of hypoglycemia and hyperglycemia, and foot care basics.   Diabetes Blitz:  -Group instruction provided by PowerPoint slides, verbal discussion, and written materials to support subject matter. The instructor gives an explanation and review of the physiology behind type 1 and type 2 diabetes, diabetes medications and rational behind using different medications, pre- and post-prandial blood glucose recommendations and Hemoglobin A1c goals, diabetes diet, and exercise including blood glucose guidelines for exercising safely.    Portion Distortion:  -Group instruction provided by PowerPoint slides, verbal discussion, written materials, and food models to support subject matter. The instructor gives an explanation of serving size versus portion size, changes in portions sizes over the last 20 years, and what consists of a serving from each food group.   Stress Management:  -Group instruction provided by verbal instruction, video, and written materials to support subject matter.  Instructors review role of stress in heart disease and how to cope with stress positively.     Exercising on Your Own:  -Group instruction  provided by verbal instruction, power point, and  written materials to support subject.  Instructors discuss benefits of exercise, components of exercise, frequency and intensity of exercise, and end points for exercise.  Also discuss use of nitroglycerin and activating EMS.  Review options of places to exercise outside of rehab.  Review guidelines for sex with heart disease.   Cardiac Drugs I:  -Group instruction provided by verbal instruction and written materials to support subject.  Instructor reviews cardiac drug classes: antiplatelets, anticoagulants, beta blockers, and statins.  Instructor discusses reasons, side effects, and lifestyle considerations for each drug class.   Cardiac Drugs II:  -Group instruction provided by verbal instruction and written materials to support subject.  Instructor reviews cardiac drug classes: angiotensin converting enzyme inhibitors (ACE-I), angiotensin II receptor blockers (ARBs), nitrates, and calcium channel blockers.  Instructor discusses reasons, side effects, and lifestyle considerations for each drug class.   Anatomy and Physiology of the Circulatory System:  -Group instruction provided by verbal instruction, video, and written materials to support subject.  Reviews functional anatomy of heart, how it relates to various diagnoses, and what role the heart plays in the overall system.   Knowledge Questionnaire Score:     Knowledge Questionnaire Score - 04/07/16 1116    Knowledge Questionnaire Score   Pre Score 20/24      Core Components/Risk Factors/Patient Goals at Admission:     Personal Goals and Risk Factors at Admission - 04/07/16 0827    Core Components/Risk Factors/Patient Goals on Admission    Weight Management Yes   Intervention Weight Management: Develop a combined nutrition and exercise program designed to reach desired caloric intake, while maintaining appropriate intake of nutrient and fiber, sodium and fats, and appropriate energy expenditure required for the weight goal.;Weight  Management: Provide education and appropriate resources to help participant work on and attain dietary goals.;Obesity: Provide education and appropriate resources to help participant work on and attain dietary goals.;Weight Management/Obesity: Establish reasonable short term and long term weight goals.   Expected Outcomes Short Term: Continue to assess and modify interventions until short term weight is achieved;Long Term: Adherence to nutrition and physical activity/exercise program aimed toward attainment of established weight goal;Weight Maintenance: Understanding of the daily nutrition guidelines, which includes 25-35% calories from fat, 7% or less cal from saturated fats, less than 200mg  cholesterol, less than 1.5gm of sodium, & 5 or more servings of fruits and vegetables daily;Weight Loss: Understanding of general recommendations for a balanced deficit meal plan, which promotes 1-2 lb weight loss per week and includes a negative energy balance of [phone] kcal/d;Understanding recommendations for meals to include 15-35% energy as protein, 25-35% energy from fat, 35-60% energy from carbohydrates, less than 200mg  of dietary cholesterol, 20-35 gm of total fiber daily;Understanding of distribution of calorie intake throughout the day with the consumption of 4-5 meals/snacks   Sedentary Yes   Intervention Provide advice, education, support and counseling about physical activity/exercise needs.;Develop an individualized exercise prescription for aerobic and resistive training based on initial evaluation findings, risk stratification, comorbidities and participant's personal goals.   Expected Outcomes Achievement of increased cardiorespiratory fitness and enhanced flexibility, muscular endurance and strength shown through measurements of functional capacity and personal statement of participant.   Increase Strength and Stamina Yes   Intervention Provide advice, education, support and counseling about physical  activity/exercise needs.;Develop an individualized exercise prescription for aerobic and resistive training based on initial evaluation findings, risk stratification, comorbidities and participant's personal goals.   Expected Outcomes Achievement of increased  cardiorespiratory fitness and enhanced flexibility, muscular endurance and strength shown through measurements of functional capacity and personal statement of participant.   Improve shortness of breath with ADL's Yes   Intervention Provide education, individualized exercise plan and daily activity instruction to help decrease symptoms of SOB with activities of daily living.   Expected Outcomes Short Term: Achieves a reduction of symptoms when performing activities of daily living.   Diabetes Yes   Intervention Provide education about proper nutrition, including hydration, and aerobic/resistive exercise prescription along with prescribed medications to achieve blood glucose in normal ranges: Fasting glucose 65-99 mg/dL;Provide education about signs/symptoms and action to take for hypo/hyperglycemia.   Expected Outcomes Short Term: Participant verbalizes understanding of the signs/symptoms and immediate care of hyper/hypoglycemia, proper foot care and importance of medication, aerobic/resistive exercise and nutrition plan for blood glucose control.   Personal Goal Other Yes   Personal Goal short: get back to yardwork, gardening and planting  long: be able to walk longer distances with less SOB   Intervention provide exercising programming to increase endurance and stamina to assist with gardening and walking longer distances   Expected Outcomes be able to garden and plant and walk further      Core Components/Risk Factors/Patient Goals Review:    Core Components/Risk Factors/Patient Goals at Discharge (Final Review):    ITP Comments:     ITP Comments      04/07/16 0800           ITP Comments Dr. Armanda Magicraci Turner, Medical Director           Comments: Pt is making expected progress toward personal goals after completing 5 sessions. Recommend continued exercise and life style modification education including  stress management and relaxation techniques to decrease cardiac risk profile.

## 2016-04-20 NOTE — Progress Notes (Signed)
QUALITY OF LIFE SCORE REVIEW  Pt completed Quality of Life survey as a participant in Cardiac Rehab. Scores 21.0 or below are considered low. Pt overall scores are satisfactory.   Patient quality of life slightly altered by physical constraints which limits ability to perform as prior to recent cardiac illness.  Pt is most concerned with his dyspnea on exertion, which occurs with walking briskly.  Pt states he is unable to walk 50 ft briskly without dyspnea however if he walks "baby steps" or slower pace he is not effected. Pt is also concerned about his endurance and stamina.  Offered emotional support and reassurance.  Will continue to monitor and intervene as necessary.

## 2016-04-22 ENCOUNTER — Encounter (HOSPITAL_COMMUNITY)
Admission: RE | Admit: 2016-04-22 | Discharge: 2016-04-22 | Disposition: A | Discharge status: Home / Self Care | Payer: Medicare Other | Source: Ambulatory Visit | Attending: Cardiovascular Disease | Admitting: Cardiovascular Disease

## 2016-04-22 DIAGNOSIS — Z7902 Long term (current) use of antithrombotics/antiplatelets: Secondary | ICD-10-CM | POA: Diagnosis not present

## 2016-04-22 DIAGNOSIS — I208 Other forms of angina pectoris: Secondary | ICD-10-CM | POA: Diagnosis not present

## 2016-04-22 DIAGNOSIS — E785 Hyperlipidemia, unspecified: Secondary | ICD-10-CM | POA: Diagnosis not present

## 2016-04-22 DIAGNOSIS — I10 Essential (primary) hypertension: Secondary | ICD-10-CM | POA: Diagnosis not present

## 2016-04-22 DIAGNOSIS — E119 Type 2 diabetes mellitus without complications: Secondary | ICD-10-CM | POA: Diagnosis not present

## 2016-04-22 DIAGNOSIS — Z79899 Other long term (current) drug therapy: Secondary | ICD-10-CM | POA: Diagnosis not present

## 2016-04-25 ENCOUNTER — Encounter (HOSPITAL_COMMUNITY)
Admission: RE | Admit: 2016-04-25 | Discharge: 2016-04-25 | Disposition: A | Discharge status: Home / Self Care | Payer: Medicare Other | Source: Ambulatory Visit | Attending: Cardiovascular Disease | Admitting: Cardiovascular Disease

## 2016-04-25 DIAGNOSIS — Z7902 Long term (current) use of antithrombotics/antiplatelets: Secondary | ICD-10-CM | POA: Diagnosis not present

## 2016-04-25 DIAGNOSIS — I208 Other forms of angina pectoris: Secondary | ICD-10-CM | POA: Diagnosis not present

## 2016-04-25 DIAGNOSIS — E785 Hyperlipidemia, unspecified: Secondary | ICD-10-CM | POA: Diagnosis not present

## 2016-04-25 DIAGNOSIS — I10 Essential (primary) hypertension: Secondary | ICD-10-CM | POA: Diagnosis not present

## 2016-04-25 DIAGNOSIS — E119 Type 2 diabetes mellitus without complications: Secondary | ICD-10-CM | POA: Diagnosis not present

## 2016-04-25 DIAGNOSIS — Z79899 Other long term (current) drug therapy: Secondary | ICD-10-CM | POA: Diagnosis not present

## 2016-04-25 NOTE — Progress Notes (Signed)
Reviewed home exercise with pt today.  Pt plans to walk for exercise, 2x/week in addition to cardiac rehab.  Reviewed THR, pulse, RPE, sign and symptoms, and when to call 911 or MD.  Also discussed weather considerations and indoor options.  Pt voiced understanding.    [name] [name] [employer],MS,ACSM RCEP

## 2016-04-27 ENCOUNTER — Encounter (HOSPITAL_COMMUNITY)
Admission: RE | Admit: 2016-04-27 | Discharge: 2016-04-27 | Disposition: A | Discharge status: Home / Self Care | Payer: Medicare Other | Source: Ambulatory Visit | Attending: Cardiovascular Disease | Admitting: Cardiovascular Disease

## 2016-04-27 DIAGNOSIS — Z79899 Other long term (current) drug therapy: Secondary | ICD-10-CM | POA: Diagnosis not present

## 2016-04-27 DIAGNOSIS — E785 Hyperlipidemia, unspecified: Secondary | ICD-10-CM | POA: Diagnosis not present

## 2016-04-27 DIAGNOSIS — E119 Type 2 diabetes mellitus without complications: Secondary | ICD-10-CM | POA: Diagnosis not present

## 2016-04-27 DIAGNOSIS — Z7902 Long term (current) use of antithrombotics/antiplatelets: Secondary | ICD-10-CM | POA: Diagnosis not present

## 2016-04-27 DIAGNOSIS — I208 Other forms of angina pectoris: Secondary | ICD-10-CM | POA: Diagnosis not present

## 2016-04-27 DIAGNOSIS — I10 Essential (primary) hypertension: Secondary | ICD-10-CM | POA: Diagnosis not present

## 2016-04-29 ENCOUNTER — Encounter (HOSPITAL_COMMUNITY)
Admission: RE | Admit: 2016-04-29 | Discharge: 2016-04-29 | Disposition: A | Discharge status: Home / Self Care | Payer: Medicare Other | Source: Ambulatory Visit | Attending: Cardiovascular Disease | Admitting: Cardiovascular Disease

## 2016-04-29 DIAGNOSIS — I208 Other forms of angina pectoris: Secondary | ICD-10-CM | POA: Diagnosis not present

## 2016-04-29 DIAGNOSIS — E119 Type 2 diabetes mellitus without complications: Secondary | ICD-10-CM | POA: Diagnosis not present

## 2016-04-29 DIAGNOSIS — Z79899 Other long term (current) drug therapy: Secondary | ICD-10-CM | POA: Diagnosis not present

## 2016-04-29 DIAGNOSIS — I10 Essential (primary) hypertension: Secondary | ICD-10-CM | POA: Diagnosis not present

## 2016-04-29 DIAGNOSIS — Z7902 Long term (current) use of antithrombotics/antiplatelets: Secondary | ICD-10-CM | POA: Diagnosis not present

## 2016-04-29 DIAGNOSIS — E785 Hyperlipidemia, unspecified: Secondary | ICD-10-CM | POA: Diagnosis not present

## 2016-05-02 ENCOUNTER — Encounter (HOSPITAL_COMMUNITY)
Admission: RE | Admit: 2016-05-02 | Discharge: 2016-05-02 | Disposition: A | Discharge status: Home / Self Care | Payer: Medicare Other | Source: Ambulatory Visit | Attending: Cardiovascular Disease | Admitting: Cardiovascular Disease

## 2016-05-02 DIAGNOSIS — I10 Essential (primary) hypertension: Secondary | ICD-10-CM | POA: Diagnosis not present

## 2016-05-02 DIAGNOSIS — Z79899 Other long term (current) drug therapy: Secondary | ICD-10-CM | POA: Diagnosis not present

## 2016-05-02 DIAGNOSIS — Z7902 Long term (current) use of antithrombotics/antiplatelets: Secondary | ICD-10-CM | POA: Insufficient documentation

## 2016-05-02 DIAGNOSIS — I208 Other forms of angina pectoris: Secondary | ICD-10-CM | POA: Diagnosis not present

## 2016-05-02 DIAGNOSIS — Z87891 Personal history of nicotine dependence: Secondary | ICD-10-CM | POA: Diagnosis not present

## 2016-05-02 DIAGNOSIS — E119 Type 2 diabetes mellitus without complications: Secondary | ICD-10-CM | POA: Insufficient documentation

## 2016-05-02 DIAGNOSIS — E785 Hyperlipidemia, unspecified: Secondary | ICD-10-CM | POA: Insufficient documentation

## 2016-05-04 ENCOUNTER — Encounter (HOSPITAL_COMMUNITY)
Admission: RE | Admit: 2016-05-04 | Discharge: 2016-05-04 | Disposition: A | Discharge status: Home / Self Care | Payer: Medicare Other | Source: Ambulatory Visit | Attending: Cardiovascular Disease | Admitting: Cardiovascular Disease

## 2016-05-04 DIAGNOSIS — E785 Hyperlipidemia, unspecified: Secondary | ICD-10-CM | POA: Diagnosis not present

## 2016-05-04 DIAGNOSIS — I208 Other forms of angina pectoris: Secondary | ICD-10-CM | POA: Diagnosis not present

## 2016-05-04 DIAGNOSIS — Z7902 Long term (current) use of antithrombotics/antiplatelets: Secondary | ICD-10-CM | POA: Diagnosis not present

## 2016-05-04 DIAGNOSIS — Z79899 Other long term (current) drug therapy: Secondary | ICD-10-CM | POA: Diagnosis not present

## 2016-05-04 DIAGNOSIS — I10 Essential (primary) hypertension: Secondary | ICD-10-CM | POA: Diagnosis not present

## 2016-05-04 DIAGNOSIS — E119 Type 2 diabetes mellitus without complications: Secondary | ICD-10-CM | POA: Diagnosis not present

## 2016-05-06 ENCOUNTER — Encounter (HOSPITAL_COMMUNITY)
Admission: RE | Admit: 2016-05-06 | Discharge: 2016-05-06 | Disposition: A | Discharge status: Home / Self Care | Payer: Medicare Other | Source: Ambulatory Visit | Attending: Cardiovascular Disease | Admitting: Cardiovascular Disease

## 2016-05-06 DIAGNOSIS — E785 Hyperlipidemia, unspecified: Secondary | ICD-10-CM | POA: Diagnosis not present

## 2016-05-06 DIAGNOSIS — I10 Essential (primary) hypertension: Secondary | ICD-10-CM | POA: Diagnosis not present

## 2016-05-06 DIAGNOSIS — I208 Other forms of angina pectoris: Secondary | ICD-10-CM

## 2016-05-06 DIAGNOSIS — Z7902 Long term (current) use of antithrombotics/antiplatelets: Secondary | ICD-10-CM | POA: Diagnosis not present

## 2016-05-06 DIAGNOSIS — E119 Type 2 diabetes mellitus without complications: Secondary | ICD-10-CM | POA: Diagnosis not present

## 2016-05-06 DIAGNOSIS — Z79899 Other long term (current) drug therapy: Secondary | ICD-10-CM | POA: Diagnosis not present

## 2016-05-06 NOTE — Progress Notes (Signed)
[name] DockVernon E [name] 82 y.o. male Nutrition Note Spoke with pt. Nutrition Plan and Nutrition Survey goals reviewed with pt. Pt is following Step 1 of the Therapeutic Lifestyle Changes diet. Pt wants to lose wt "because my doctor told me I needed to." Wt loss tips reviewed. Pt is diabetic. No recent A1c noted. Pt checks CBG's 1 time a day "about 1 hour after I eat."  CBG's reportedly "around 170 mg/dL." Pt reports his last B1YA1c was "7." Age-appropriate diet and DM recommendations discussed.  Pt expressed understanding of the information reviewed. Pt aware of nutrition education classes offered and is unable to attend nutrition classes due to coming to rehab 3 days/week.  No results found for: HGBA1C Wt Readings from Last 3 Encounters:  04/07/16 214 lb 1.1 oz (97.1 kg)  02/24/16 219 lb 6.4 oz (99.519 kg)  12/17/15 210 lb 1.9 oz (95.31 kg)   Nutrition Diagnosis ? Food-and nutrition-related knowledge deficit related to lack of exposure to information as related to diagnosis of: ? CVD ? DM ? Obesity related to excessive energy intake as evidenced by a BMI of 33.1  Nutrition Intervention ? Pt's individual nutrition plan reviewed with pt. ? Benefits of adopting Therapeutic Lifestyle Changes discussed when Medficts reviewed. ? Pt to attend the Portion Distortion class ? Pt to attend the Diabetes Q & A class ? Pt given handouts for: ? Nutrition I class ? Nutrition II class ? Diabetes Blitz Class ? Continue client-centered nutrition education by RD, as part of interdisciplinary care. Goal(s) ? Pt to identify food quantities necessary to achieve weight loss of 6-10 lb at graduation from cardiac rehab.  ? Pt to describe the benefit of including fruits, vegetables, whole grains, and low-fat dairy products in a heart healthy meal plan. ? CBG concentrations in the normal range or as close to normal as is safely possible. Monitor and Evaluate progress toward nutrition goal with team. Mickle PlumbEdna Taite [name], M.Ed, RD,  LDN, CDE 05/06/2016 11:25 AM

## 2016-05-09 ENCOUNTER — Encounter (HOSPITAL_COMMUNITY)
Admission: RE | Admit: 2016-05-09 | Discharge: 2016-05-09 | Disposition: A | Discharge status: Home / Self Care | Payer: Medicare Other | Source: Ambulatory Visit | Attending: Cardiovascular Disease | Admitting: Cardiovascular Disease

## 2016-05-09 DIAGNOSIS — E785 Hyperlipidemia, unspecified: Secondary | ICD-10-CM | POA: Diagnosis not present

## 2016-05-09 DIAGNOSIS — I208 Other forms of angina pectoris: Secondary | ICD-10-CM | POA: Diagnosis not present

## 2016-05-09 DIAGNOSIS — I10 Essential (primary) hypertension: Secondary | ICD-10-CM | POA: Diagnosis not present

## 2016-05-09 DIAGNOSIS — Z79899 Other long term (current) drug therapy: Secondary | ICD-10-CM | POA: Diagnosis not present

## 2016-05-09 DIAGNOSIS — Z7902 Long term (current) use of antithrombotics/antiplatelets: Secondary | ICD-10-CM | POA: Diagnosis not present

## 2016-05-09 DIAGNOSIS — E119 Type 2 diabetes mellitus without complications: Secondary | ICD-10-CM | POA: Diagnosis not present

## 2016-05-11 ENCOUNTER — Encounter (HOSPITAL_COMMUNITY)
Admission: RE | Admit: 2016-05-11 | Discharge: 2016-05-11 | Disposition: A | Discharge status: Home / Self Care | Payer: Medicare Other | Source: Ambulatory Visit | Attending: Cardiovascular Disease | Admitting: Cardiovascular Disease

## 2016-05-11 DIAGNOSIS — E119 Type 2 diabetes mellitus without complications: Secondary | ICD-10-CM | POA: Diagnosis not present

## 2016-05-11 DIAGNOSIS — E785 Hyperlipidemia, unspecified: Secondary | ICD-10-CM | POA: Diagnosis not present

## 2016-05-11 DIAGNOSIS — Z7902 Long term (current) use of antithrombotics/antiplatelets: Secondary | ICD-10-CM | POA: Diagnosis not present

## 2016-05-11 DIAGNOSIS — I208 Other forms of angina pectoris: Secondary | ICD-10-CM

## 2016-05-11 DIAGNOSIS — Z79899 Other long term (current) drug therapy: Secondary | ICD-10-CM | POA: Diagnosis not present

## 2016-05-11 DIAGNOSIS — I10 Essential (primary) hypertension: Secondary | ICD-10-CM | POA: Diagnosis not present

## 2016-05-11 NOTE — Progress Notes (Signed)
Cardiac Individual Treatment Plan  Patient Details  Name: DEMARRI ELIE MRN: [id number] Date of Birth: [birth year] Referring Provider:        CARDIAC REHAB PHASE II ORIENTATION from 04/07/2016 in [facility] CARDIAC REHAB   Referring Provider  Charlton Haws, MD      Initial Encounter Date:       CARDIAC REHAB PHASE II ORIENTATION from 04/07/2016 in [facility] CARDIAC REHAB   Date  04/07/16   Referring Provider  Charlton Haws, MD      Visit Diagnosis: Stable angina [facility])  Patient's Home Medications on Admission:  Current outpatient prescriptions:  .  alfuzosin (UROXATRAL) 10 MG 24 hr tablet, Take 10 mg by mouth daily with breakfast., Disp: , Rfl:  .  allopurinol (ZYLOPRIM) 100 MG tablet, Take 100 mg by mouth daily., Disp: , Rfl:  .  brinzolamide (AZOPT) 1 % ophthalmic suspension, Place 1 drop into both eyes 2 (two) times daily., Disp: , Rfl:  .  ELIQUIS 5 MG TABS tablet, TAKE 1 TABLET BY MOUTH TWICE A DAY, Disp: 60 tablet, Rfl: 0 .  finasteride (PROSCAR) 5 MG tablet, Take 5 mg by mouth daily., Disp: , Rfl: 3 .  furosemide (LASIX) 40 MG tablet, Take 1 tablet (40 mg total) by mouth daily., Disp: 30 tablet, Rfl: 6 .  glimepiride (AMARYL) 4 MG tablet, Take 4 mg by mouth daily., Disp: , Rfl:  .  isosorbide mononitrate (IMDUR) 60 MG 24 hr tablet, Take 60 mg by mouth daily., Disp: , Rfl:  .  losartan (COZAAR) 100 MG tablet, Take 100 mg by mouth daily., Disp: , Rfl:  .  metFORMIN (GLUMETZA) 1000 MG (MOD) 24 hr tablet, Take 1,000 mg by mouth daily with breakfast., Disp: , Rfl:  .  Multiple Vitamins-Minerals (CENTRUM PO), Take 1 tablet by mouth every other day. , Disp: , Rfl:  .  MYRBETRIQ 50 MG TB24 tablet, Take 50 mg by mouth daily., Disp: , Rfl:  .  nitroGLYCERIN (NITROSTAT) 0.4 MG SL tablet, Place 1 tablet (0.4 mg total) under the tongue every 5 (five) minutes as needed for chest pain., Disp: 25 tablet, Rfl: 6 .  potassium chloride (K-DUR) 10 MEQ tablet,  Take 1 tablet (10 mEq total) by mouth daily., Disp: 30 tablet, Rfl: 6 .  RAPAFLO 8 MG CAPS capsule, Take 8 mg by mouth daily., Disp: , Rfl:  .  saxagliptin HCl (ONGLYZA) 5 MG TABS tablet, Take 5 mg by mouth daily., Disp: , Rfl:  .  simvastatin (ZOCOR) 80 MG tablet, Take 40 mg by mouth daily at 6 PM. , Disp: , Rfl:  .  travoprost, benzalkonium, (TRAVATAN) 0.004 % ophthalmic solution, Place 1 drop into both eyes at bedtime., Disp: , Rfl:   Past Medical History: Past Medical History  Diagnosis Date  . DM (diabetes mellitus), type 2 with renal complications (HCC)   . Hypertension   . Hyperlipidemia   . Prostatic hypertrophy, benign, with obstruction   . Sick sinus syndrome (HCC)   . Obesity   . Microalbuminuria   . Glaucoma   . Renal calculus   . Osteoarthritis   . Chronic kidney disease, stage III (moderate)   . Chest pain   . Microalbuminuria 2013  . BPH with elevated PSA   . ED (erectile dysfunction)     Tobacco Use: History  Smoking status  . Former Smoker  . Types: Cigarettes  Smokeless tobacco  . Not on file    Labs:  Recent Review Flowsheet Data    There is no flowsheet data to display.      Capillary Blood Glucose: Lab Results  Component Value Date   GLUCAP 152* 05/16/2016   GLUCAP 138* 04/20/2016   GLUCAP 225* 04/18/2016   GLUCAP 191* 04/15/2016   GLUCAP 153* 04/11/2016     Exercise Target Goals:    Exercise Program Goal: Individual exercise prescription set with THRR, safety & activity barriers. Participant demonstrates ability to understand and report RPE using BORG scale, to self-measure pulse accurately, and to acknowledge the importance of the exercise prescription.  Exercise Prescription Goal: Starting with aerobic activity 30 plus minutes a day, 3 days per week for initial exercise prescription. Provide home exercise prescription and guidelines that participant acknowledges understanding prior to discharge.  Activity Barriers & Risk  Stratification:     Activity Barriers & Cardiac Risk Stratification - 04/07/16 0827    Activity Barriers & Cardiac Risk Stratification   Activity Barriers Left Knee Replacement   Cardiac Risk Stratification High      6 Minute Walk:     6 Minute Walk      04/07/16 1018 04/07/16 1118     6 Minute Walk   Phase Initial Initial    Distance  856 feet    Walk Time 6 minutes 6 minutes    # of Rest Breaks 0 0    MPH  1.6    METS  0.5    RPE  11    VO2 Peak  1.8    Symptoms  No    Resting HR 60 bpm     Resting BP 126/60 mmHg     Max Ex. HR 71 bpm     Max Ex. BP 126/62 mmHg     2 Minute Post BP 122/68 mmHg        Initial Exercise Prescription:     Initial Exercise Prescription - 04/07/16 1100    Date of Initial Exercise RX and Referring Provider   Date 04/07/16   Referring Provider Charlton Haws, MD   NuStep   Level 1   Minutes 15   METs 1.2   Arm Ergometer   Level 2   Watts 15   Minutes 15   METs 1   Prescription Details   Frequency (times per week) 3   Duration Progress to 30 minutes of continuous aerobic without signs/symptoms of physical distress   Intensity   THRR 40-80% of Max Heartrate 53-106   Ratings of Perceived Exertion 11-13   Perceived Dyspnea 0-4   Progression   Progression Continue to progress workloads to maintain intensity without signs/symptoms of physical distress.   Resistance Training   Training Prescription Yes   Weight 1   Reps 10-12      Perform Capillary Blood Glucose checks as needed.  Exercise Prescription Changes:      Exercise Prescription Changes      04/29/16 1600 05/09/16 1700         Exercise Review   Progression Yes Yes      Response to Exercise   Blood Pressure (Admit) 140/80 mmHg 126/72 mmHg      Blood Pressure (Exercise) 140/80 mmHg 132/60 mmHg      Blood Pressure (Exit) 132/84 mmHg 122/60 mmHg      Heart Rate (Admit) 73 bpm 66 bpm      Heart Rate (Exercise) 112 bpm 84 bpm      Heart Rate (Exit) 70 bpm 61  bpm  Rating of Perceived Exertion (Exercise) 12 13      Comments  Rviewed HEP on 623/17      Duration Progress to 30 minutes of continuous aerobic without signs/symptoms of physical distress Progress to 30 minutes of continuous aerobic without signs/symptoms of physical distress      Intensity THRR unchanged THRR unchanged      Progression   Average METs 2.1 2.4      Resistance Training   Training Prescription Yes Yes      Weight 2lbs 4lbs      Reps 10-12 10-12      NuStep   Level 2 2      Minutes 15 15      METs 2.1 2.5      Arm Ergometer   Level 2 2      Watts 15 15      Minutes 15 15      METs 1 1      Home Exercise Plan   Plans to continue exercise at  Home  see progress note for more details      Frequency  Add 2 additional days to program exercise sessions.         Exercise Comments:      Exercise Comments      05/09/16 1732           Exercise Comments Reviewed METs and goals; pt is tolerating exercise well; will continue to monitor exercise progression          Discharge Exercise Prescription (Final Exercise Prescription Changes):     Exercise Prescription Changes - 05/09/16 1700    Exercise Review   Progression Yes   Response to Exercise   Blood Pressure (Admit) 126/72 mmHg   Blood Pressure (Exercise) 132/60 mmHg   Blood Pressure (Exit) 122/60 mmHg   Heart Rate (Admit) 66 bpm   Heart Rate (Exercise) 84 bpm   Heart Rate (Exit) 61 bpm   Rating of Perceived Exertion (Exercise) 13   Comments Rviewed HEP on 623/17   Duration Progress to 30 minutes of continuous aerobic without signs/symptoms of physical distress   Intensity THRR unchanged   Progression   Average METs 2.4   Resistance Training   Training Prescription Yes   Weight 4lbs   Reps 10-12   NuStep   Level 2   Minutes 15   METs 2.5   Arm Ergometer   Level 2   Watts 15   Minutes 15   METs 1   Home Exercise Plan   Plans to continue exercise at Home  see progress note for more  details   Frequency Add 2 additional days to program exercise sessions.      Nutrition:  Target Goals: Understanding of nutrition guidelines, daily intake of sodium 1500mg , cholesterol 200mg , calories 30% from fat and 7% or less from saturated fats, daily to have 5 or more servings of fruits and vegetables.  Biometrics:     Pre Biometrics - 04/07/16 1116    Pre Biometrics   % Body Fat 33.5 %       Nutrition Therapy Plan and Nutrition Goals:     Nutrition Therapy & Goals - 04/11/16 1520    Nutrition Therapy   Diet Diabetic, Therapeutic Lifestyle Changes   Personal Nutrition Goals   Personal Goal #1 0.5-2 lb wt loss per week to a wt loss goal of 6-10 lb at graduation from Cardiac Rehab   Intervention Plan   Intervention Prescribe, educate and  counsel regarding individualized specific dietary modifications aiming towards targeted core components such as weight, hypertension, lipid management, diabetes, heart failure and other comorbidities.   Expected Outcomes Short Term Goal: Understand basic principles of dietary content, such as calories, fat, sodium, cholesterol and nutrients.;Long Term Goal: Adherence to prescribed nutrition plan.      Nutrition Discharge: Nutrition Scores:     Nutrition Assessments - 05/06/16 1049    MEDFICTS Scores   Pre Score 51      Nutrition Goals Re-Evaluation:   Psychosocial: Target Goals: Acknowledge presence or absence of depression, maximize coping skills, provide positive support system. Participant is able to verbalize types and ability to use techniques and skills needed for reducing stress and depression.  Initial Review & Psychosocial Screening:     Initial Psych Review & Screening - 04/11/16 1720    Family Dynamics   Good Support System? Yes   Barriers   Psychosocial barriers to participate in program The patient should benefit from training in stress management and relaxation.   Screening Interventions   Interventions  Encouraged to exercise      Quality of Life Scores:     Quality of Life - 04/20/16 1702    Quality of Life Scores   GLOBAL Pre --  quality of life reviewed with patient.  please see progress notes       PHQ-9:     Recent Review Flowsheet Data    There is no flowsheet data to display.      Psychosocial Evaluation and Intervention:     Psychosocial Evaluation - 04/20/16 1535    Psychosocial Evaluation & Interventions   Interventions Stress management education;Relaxation education;Encouraged to exercise with the program and follow exercise prescription   Continued Psychosocial Services Needed Yes      Psychosocial Re-Evaluation:     Psychosocial Re-Evaluation      05/11/16 1720           Psychosocial Re-Evaluation   Interventions Stress management education;Relaxation education;Encouraged to attend Cardiac Rehabilitation for the exercise       Comments pt is exercising on his own at home walking, pt continues to particpate in heavy yardwork.         Continued Psychosocial Services Needed Yes          Vocational Rehabilitation: Provide vocational rehab assistance to qualifying candidates.   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 Participant will state understanding/return demonstration of topics presented.  Learning Barriers/Preferences:     Learning Barriers/Preferences - 04/07/16 0827    Learning Barriers/Preferences   Learning Barriers Sight   Learning Preferences Written Material;Pictoral      Education Topics: Count Your Pulse:  -Group instruction provided by verbal instruction, demonstration, patient participation and  written materials to support subject.  Instructors address importance of being able to find your pulse and how to count your pulse when at home without a heart monitor.  Patients get hands on experience counting their pulse with staff help and individually.      CARDIAC REHAB PHASE II EXERCISE from 05/06/2016 in [facility] CARDIAC REHAB   Date  05/06/16   Educator  Karlene Lineman, RN  Instruction Review Code  2- meets goals/outcomes      Heart Attack, Angina, and Risk Factor Modification:  -Group instruction provided by verbal instruction, video, and written materials to support subject.  Instructors address signs and symptoms of angina and heart attacks.    Also discuss risk factors for heart disease and how to make changes to improve heart health risk factors.      CARDIAC REHAB PHASE II EXERCISE from 05/06/2016 in [facility] CARDIAC REHAB   Date  04/22/16   Instruction Review Code  2- meets goals/outcomes      Functional Fitness:  -Group instruction provided by verbal instruction, demonstration, patient participation, and written materials to support subject.  Instructors address safety measures for doing things around the house.  Discuss how to get up and down off the floor, how to pick things up properly, how to safely get out of a chair without assistance, and balance training.      CARDIAC REHAB PHASE II EXERCISE from 05/06/2016 in [facility] CARDIAC REHAB   Date  04/29/16   Instruction Review Code  2- meets goals/outcomes      Meditation and Mindfulness:  -Group instruction provided by verbal instruction, patient participation, and written materials to support subject.  Instructor addresses importance of mindfulness and meditation practice to help reduce stress and improve awareness.  Instructor also leads participants through a meditation exercise.       CARDIAC REHAB PHASE II EXERCISE from 05/06/2016 in [facility]  CARDIAC REHAB   Date  05/04/16   Instruction Review Code  2- meets goals/outcomes      Stretching for Flexibility and Mobility:  -Group instruction provided by verbal instruction, patient participation, and written materials to support subject.  Instructors lead participants through series of stretches that are designed to increase flexibility thus improving mobility.  These stretches are additional exercise for major muscle groups that are typically performed during regular warm up and cool down.      CARDIAC REHAB PHASE II EXERCISE from 05/06/2016 in [facility] CARDIAC REHAB   Date  04/20/16   Instruction Review Code  2- meets goals/outcomes      Hands Only CPR Anytime:  -Group instruction provided by verbal instruction, video, patient participation and written materials to support subject.  Instructors co-teach with [facility] video for hands only CPR.  Participants get hands on experience with mannequins.      CARDIAC REHAB PHASE II EXERCISE from 05/06/2016 in [facility] CARDIAC REHAB   Date  04/15/16   Instruction Review Code  2- meets goals/outcomes      Nutrition I class: Heart Healthy Eating:  -Group instruction provided by PowerPoint slides, verbal discussion, and written materials to support subject matter. The instructor gives an explanation and review of the Therapeutic Lifestyle Changes diet recommendations, which includes a discussion on lipid goals, dietary fat, sodium, fiber, plant stanol/sterol esters, sugar, and the components of a well-balanced, healthy diet.      CARDIAC REHAB PHASE II EXERCISE from 05/06/2016 in [facility] CARDIAC REHAB   Date  05/06/16   Educator  RD   Instruction Review Code  Not applicable [class handout given]      Nutrition II class: Lifestyle Skills:  -Group instruction provided by PowerPoint slides, verbal discussion, and written materials to support subject matter. The instructor gives an  explanation and review of label reading, grocery shopping for heart health, heart healthy recipe modifications,  and ways to make healthier choices when eating out.      CARDIAC REHAB PHASE II EXERCISE from 05/06/2016 in [facility] CARDIAC REHAB   Date  05/06/16   Educator  RD   Instruction Review Code  Not applicable [class handouts given]      Diabetes Question & Answer:  -Group instruction provided by PowerPoint slides, verbal discussion, and written materials to support subject matter. The instructor gives an explanation and review of diabetes co-morbidities, pre- and post-prandial blood glucose goals, pre-exercise blood glucose goals, signs, symptoms, and treatment of hypoglycemia and hyperglycemia, and foot care basics.   Diabetes Blitz:  -Group instruction provided by PowerPoint slides, verbal discussion, and written materials to support subject matter. The instructor gives an explanation and review of the physiology behind type 1 and type 2 diabetes, diabetes medications and rational behind using different medications, pre- and post-prandial blood glucose recommendations and Hemoglobin A1c goals, diabetes diet, and exercise including blood glucose guidelines for exercising safely.       CARDIAC REHAB PHASE II EXERCISE from 05/06/2016 in [facility] CARDIAC REHAB   Date  05/06/16   Educator  RD   Instruction Review Code  Not applicable [class handout given]      Portion Distortion:  -Group instruction provided by PowerPoint slides, verbal discussion, written materials, and food models to support subject matter. The instructor gives an explanation of serving size versus portion size, changes in portions sizes over the last 20 years, and what consists of a serving from each food group.          CARDIAC REHAB PHASE II EXERCISE from 05/06/2016 in [facility] CARDIAC REHAB   Date  04/27/16   Educator  RD   Instruction Review Code  2- meets  goals/outcomes      Stress Management:  -Group instruction provided by verbal instruction, video, and written materials to support subject matter.  Instructors review role of stress in heart disease and how to cope with stress positively.     Exercising on Your Own:  -Group instruction provided by verbal instruction, power point, and written materials to support subject.  Instructors discuss benefits of exercise, components of exercise, frequency and intensity of exercise, and end points for exercise.  Also discuss use of nitroglycerin and activating EMS.  Review options of places to exercise outside of rehab.  Review guidelines for sex with heart disease.   Cardiac Drugs I:  -Group instruction provided by verbal instruction and written materials to support subject.  Instructor reviews cardiac drug classes: antiplatelets, anticoagulants, beta blockers, and statins.  Instructor discusses reasons, side effects, and lifestyle considerations for each drug class.   Cardiac Drugs II:  -Group instruction provided by verbal instruction and written materials to support subject.  Instructor reviews cardiac drug classes: angiotensin converting enzyme inhibitors (ACE-I), angiotensin II receptor blockers (ARBs), nitrates, and calcium channel blockers.  Instructor discusses reasons, side effects, and lifestyle considerations for each drug class.   Anatomy and Physiology of the Circulatory System:  -Group instruction provided by verbal instruction, video, and written materials to support subject.  Reviews functional anatomy of heart, how it relates to various diagnoses, and what role the heart plays in the overall system.   Knowledge Questionnaire Score:     Knowledge Questionnaire Score - 04/07/16 1116    Knowledge Questionnaire Score   Pre Score 20/24      Core Components/Risk Factors/Patient Goals at Admission:     Personal  Goals and Risk Factors at Admission - 04/07/16 0827    Core  Components/Risk Factors/Patient Goals on Admission    Weight Management Yes   Intervention Weight Management: Develop a combined nutrition and exercise program designed to reach desired caloric intake, while maintaining appropriate intake of nutrient and fiber, sodium and fats, and appropriate energy expenditure required for the weight goal.;Weight Management: Provide education and appropriate resources to help participant work on and attain dietary goals.;Obesity: Provide education and appropriate resources to help participant work on and attain dietary goals.;Weight Management/Obesity: Establish reasonable short term and long term weight goals.   Expected Outcomes Short Term: Continue to assess and modify interventions until short term weight is achieved;Long Term: Adherence to nutrition and physical activity/exercise program aimed toward attainment of established weight goal;Weight Maintenance: Understanding of the daily nutrition guidelines, which includes 25-35% calories from fat, 7% or less cal from saturated fats, less than 200mg  cholesterol, less than 1.5gm of sodium, & 5 or more servings of fruits and vegetables daily;Weight Loss: Understanding of general recommendations for a balanced deficit meal plan, which promotes 1-2 lb weight loss per week and includes a negative energy balance of [id number] kcal/d;Understanding recommendations for meals to include 15-35% energy as protein, 25-35% energy from fat, 35-60% energy from carbohydrates, less than 200mg  of dietary cholesterol, 20-35 gm of total fiber daily;Understanding of distribution of calorie intake throughout the day with the consumption of 4-5 meals/snacks   Sedentary Yes   Intervention Provide advice, education, support and counseling about physical activity/exercise needs.;Develop an individualized exercise prescription for aerobic and resistive training based on initial evaluation findings, risk stratification, comorbidities and participant's  personal goals.   Expected Outcomes Achievement of increased cardiorespiratory fitness and enhanced flexibility, muscular endurance and strength shown through measurements of functional capacity and personal statement of participant.   Increase Strength and Stamina Yes   Intervention Provide advice, education, support and counseling about physical activity/exercise needs.;Develop an individualized exercise prescription for aerobic and resistive training based on initial evaluation findings, risk stratification, comorbidities and participant's personal goals.   Expected Outcomes Achievement of increased cardiorespiratory fitness and enhanced flexibility, muscular endurance and strength shown through measurements of functional capacity and personal statement of participant.   Improve shortness of breath with ADL's Yes   Intervention Provide education, individualized exercise plan and daily activity instruction to help decrease symptoms of SOB with activities of daily living.   Expected Outcomes Short Term: Achieves a reduction of symptoms when performing activities of daily living.   Diabetes Yes   Intervention Provide education about proper nutrition, including hydration, and aerobic/resistive exercise prescription along with prescribed medications to achieve blood glucose in normal ranges: Fasting glucose 65-99 mg/dL;Provide education about signs/symptoms and action to take for hypo/hyperglycemia.   Expected Outcomes Short Term: Participant verbalizes understanding of the signs/symptoms and immediate care of hyper/hypoglycemia, proper foot care and importance of medication, aerobic/resistive exercise and nutrition plan for blood glucose control.   Personal Goal Other Yes   Personal Goal short: get back to yardwork, gardening and planting  long: be able to walk longer distances with less SOB   Intervention provide exercising programming to increase endurance and stamina to assist with gardening and  walking longer distances   Expected Outcomes be able to garden and plant and walk further      Core Components/Risk Factors/Patient Goals Review:      Goals and Risk Factor Review      05/09/16 1731 05/09/16 1732  Core Components/Risk Factors/Patient Goals Review   Personal Goals Review Other;Improve shortness of breath with ADL's       Review pt is back to doing yard work without any complications or issues       Expected Outcomes pt will be able to walk longer and do yardwork on her own pt will be able to walk longer and do yardwork on his own         Core Components/Risk Factors/Patient Goals at Discharge (Final Review):      Goals and Risk Factor Review - 05/09/16 1732    Core Components/Risk Factors/Patient Goals Review   Expected Outcomes pt will be able to walk longer and do yardwork on his own      ITP Comments:     ITP Comments      04/07/16 0800           ITP Comments Dr. Armanda Magic, Medical Director          Comments: Pt is making expected progress toward personal goals after completing  14 sessions. Recommend continued exercise and life style modification education including  stress management and relaxation techniques to decrease cardiac risk profile.

## 2016-05-13 ENCOUNTER — Encounter (HOSPITAL_COMMUNITY)
Admission: RE | Admit: 2016-05-13 | Discharge: 2016-05-13 | Disposition: A | Discharge status: Home / Self Care | Payer: Medicare Other | Source: Ambulatory Visit | Attending: Cardiovascular Disease | Admitting: Cardiovascular Disease

## 2016-05-13 DIAGNOSIS — Z79899 Other long term (current) drug therapy: Secondary | ICD-10-CM | POA: Diagnosis not present

## 2016-05-13 DIAGNOSIS — I208 Other forms of angina pectoris: Secondary | ICD-10-CM

## 2016-05-13 DIAGNOSIS — E785 Hyperlipidemia, unspecified: Secondary | ICD-10-CM | POA: Diagnosis not present

## 2016-05-13 DIAGNOSIS — E119 Type 2 diabetes mellitus without complications: Secondary | ICD-10-CM | POA: Diagnosis not present

## 2016-05-13 DIAGNOSIS — I10 Essential (primary) hypertension: Secondary | ICD-10-CM | POA: Diagnosis not present

## 2016-05-13 DIAGNOSIS — Z7902 Long term (current) use of antithrombotics/antiplatelets: Secondary | ICD-10-CM | POA: Diagnosis not present

## 2016-05-16 ENCOUNTER — Encounter (HOSPITAL_COMMUNITY)
Admission: RE | Admit: 2016-05-16 | Discharge: 2016-05-16 | Disposition: A | Discharge status: Home / Self Care | Payer: Medicare Other | Source: Ambulatory Visit | Attending: Cardiovascular Disease | Admitting: Cardiovascular Disease

## 2016-05-16 DIAGNOSIS — I208 Other forms of angina pectoris: Secondary | ICD-10-CM | POA: Diagnosis not present

## 2016-05-16 DIAGNOSIS — Z7902 Long term (current) use of antithrombotics/antiplatelets: Secondary | ICD-10-CM | POA: Diagnosis not present

## 2016-05-16 DIAGNOSIS — E785 Hyperlipidemia, unspecified: Secondary | ICD-10-CM | POA: Diagnosis not present

## 2016-05-16 DIAGNOSIS — Z79899 Other long term (current) drug therapy: Secondary | ICD-10-CM | POA: Diagnosis not present

## 2016-05-16 DIAGNOSIS — I10 Essential (primary) hypertension: Secondary | ICD-10-CM | POA: Diagnosis not present

## 2016-05-16 DIAGNOSIS — E119 Type 2 diabetes mellitus without complications: Secondary | ICD-10-CM | POA: Diagnosis not present

## 2016-05-16 LAB — GLUCOSE, CAPILLARY: GLUCOSE-CAPILLARY: 152 mg/dL — AB (ref 65–99)

## 2016-05-18 ENCOUNTER — Encounter (HOSPITAL_COMMUNITY): Payer: Medicare Other

## 2016-05-18 ENCOUNTER — Ambulatory Visit (INDEPENDENT_AMBULATORY_CARE_PROVIDER_SITE_OTHER): Payer: Medicare Other | Admitting: Cardiovascular Disease

## 2016-05-18 ENCOUNTER — Encounter: Payer: Self-pay | Admitting: Cardiovascular Disease

## 2016-05-18 VITALS — BP 126/70 | HR 54 | Ht 67.5 in | Wt 215.4 lb

## 2016-05-18 DIAGNOSIS — I10 Essential (primary) hypertension: Secondary | ICD-10-CM | POA: Diagnosis not present

## 2016-05-18 DIAGNOSIS — I208 Other forms of angina pectoris: Secondary | ICD-10-CM | POA: Diagnosis not present

## 2016-05-18 DIAGNOSIS — I481 Persistent atrial fibrillation: Secondary | ICD-10-CM

## 2016-05-18 DIAGNOSIS — I25119 Atherosclerotic heart disease of native coronary artery with unspecified angina pectoris: Secondary | ICD-10-CM | POA: Diagnosis not present

## 2016-05-18 DIAGNOSIS — I4819 Other persistent atrial fibrillation: Secondary | ICD-10-CM

## 2016-05-18 LAB — CBC WITH DIFFERENTIAL/PLATELET
BASOS ABS: 0 {cells}/uL (ref 0–200)
BASOS PCT: 0 %
EOS PCT: 4 %
Eosinophils Absolute: 220 cells/uL (ref 15–500)
HCT: 40.2 % (ref 38.5–50.0)
HEMOGLOBIN: 13.4 g/dL (ref 13.2–17.1)
LYMPHS ABS: 1320 {cells}/uL (ref 850–3900)
Lymphocytes Relative: 24 %
MCH: 28.6 pg (ref 27.0–33.0)
MCHC: 33.3 g/dL (ref 32.0–36.0)
MCV: 85.7 fL (ref 80.0–100.0)
MPV: 9.7 fL (ref 7.5–12.5)
Monocytes Absolute: 330 cells/uL (ref 200–950)
Monocytes Relative: 6 %
NEUTROS ABS: 3630 {cells}/uL (ref 1500–7800)
Neutrophils Relative %: 66 %
PLATELETS: 112 10*3/uL — AB (ref 140–400)
RBC: 4.69 MIL/uL (ref 4.20–5.80)
RDW: 15.4 % — ABNORMAL HIGH (ref 11.0–15.0)
WBC: 5.5 10*3/uL (ref 3.8–10.8)

## 2016-05-18 LAB — BASIC METABOLIC PANEL
BUN: 24 mg/dL (ref 7–25)
CALCIUM: 9.1 mg/dL (ref 8.6–10.3)
CHLORIDE: 107 mmol/L (ref 98–110)
CO2: 25 mmol/L (ref 20–31)
Creat: 1.36 mg/dL — ABNORMAL HIGH (ref 0.70–1.11)
GLUCOSE: 245 mg/dL — AB (ref 65–99)
POTASSIUM: 4.3 mmol/L (ref 3.5–5.3)
SODIUM: 141 mmol/L (ref 135–146)

## 2016-05-18 NOTE — Progress Notes (Signed)
Cardiology Office Note   Date:  05/18/2016   ID:  [name] [name], DOB November 06, 1924, MRN [id number]  PCP:  [name] Garbe, MD  Cardiologist:   [name] Miss, MD   Chief Complaint  Patient presents with  . Follow-up     Problem List 1. Exertional chest pain  2. DM 2 3. Hyperlipidemia 4. Essential HTN 5. Atrial fib   History of Present Illness: [name] [name] is a 82 y.o. male who presents for evaluation of exertional CP He's been very healthy and very active for many years. 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 He still keeps up with his yard work. This past weekend he was spreading e out on his front and back are. He spent about 160 pounds of grass seed. He noted that he started having some episodes of exertional chest pain. He stopped to rest and the pain resolved. He then resumed his yard work and the pain returned again. Deep left sided chest pain . , associated with dyspnea and leg weakness .  He was able to finish the artery without too much difficulty. Since that time, he's been having some exertional  leg fatigue, and generalized fatigue. He has not had any further chest pain   no dietary restrictions.   Feb. 16, 2017:  No cardiac issues.  Has a cough from burning trash last week.     February 24, 2016  Myoview showed a previous Iff. MI.   EF = 60%  Having more DO E.   Unable to walk very far . He has DOE walking to the mail box but can dig in the garden without too much trouble.  His medical doctor is scheduling him for rehab.   He was in NSR at his previous visits.   Since that time , several ECGs have shown A-fib  Has been on Eliquis   May 18, 2016:  Breathing is good.  No CP Has some leg weakness,   Can walk a mile if he takes his time   Past Medical History  Diagnosis Date  . DM (diabetes mellitus), type 2 with renal complications (HCC)   . Hypertension   . Hyperlipidemia   . Prostatic hypertrophy,  benign, with obstruction   . Sick sinus syndrome (HCC)   . Obesity   . Microalbuminuria   . Glaucoma   . Renal calculus   . Osteoarthritis   . Chronic kidney disease, stage III (moderate)   . Chest pain   . Microalbuminuria 2013  . BPH with elevated PSA   . ED (erectile dysfunction)     Past Surgical History  Procedure Laterality Date  . Knee surgery    . Replacement total knee  02/26/01     Current Outpatient Prescriptions  Medication Sig Dispense Refill  . alfuzosin (UROXATRAL) 10 MG 24 hr tablet Take 10 mg by mouth daily with breakfast.    . allopurinol (ZYLOPRIM) 100 MG tablet Take 100 mg by mouth daily.    . brinzolamide (AZOPT) 1 % ophthalmic suspension Place 1 drop into both eyes 2 (two) times daily.    Marland Kitchen ELIQUIS 5 MG TABS tablet TAKE 1 TABLET BY MOUTH TWICE A DAY 60 tablet 0  . finasteride (PROSCAR) 5 MG tablet Take 5 mg by mouth daily.  3  . furosemide (LASIX) 40 MG tablet Take 1 tablet (40 mg total) by mouth daily. 30 tablet 6  . glimepiride (AMARYL) 4  MG tablet Take 4 mg by mouth daily.    . isosorbide mononitrate (IMDUR) 60 MG 24 hr tablet Take 60 mg by mouth daily.    Marland Kitchen losartan (COZAAR) 100 MG tablet Take 100 mg by mouth daily.    . metFORMIN (GLUMETZA) 1000 MG (MOD) 24 hr tablet Take 1,000 mg by mouth daily with breakfast.    . Multiple Vitamins-Minerals (CENTRUM PO) Take 1 tablet by mouth every other day.     Marland Kitchen MYRBETRIQ 50 MG TB24 tablet Take 50 mg by mouth daily.    . nitroGLYCERIN (NITROSTAT) 0.4 MG SL tablet Place 1 tablet (0.4 mg total) under the tongue every 5 (five) minutes as needed for chest pain. 25 tablet 6  . potassium chloride (K-DUR) 10 MEQ tablet Take 1 tablet (10 mEq total) by mouth daily. 30 tablet 6  . RAPAFLO 8 MG CAPS capsule Take 8 mg by mouth daily.    . saxagliptin HCl (ONGLYZA) 5 MG TABS tablet Take 5 mg by mouth daily.    . simvastatin (ZOCOR) 80 MG tablet Take 40 mg by mouth daily at 6 PM.     . travoprost, benzalkonium, (TRAVATAN)  0.004 % ophthalmic solution Place 1 drop into both eyes at bedtime.     No current facility-administered medications for this visit.    Allergies:   Review of patient's allergies indicates no known allergies.    Social History:  The patient  reports that he has quit smoking. His smoking use included Cigarettes. He does not have any smokeless tobacco history on file. He reports that he does not drink alcohol or use illicit drugs.   Family History:  The patient's family history includes Cancer in his paternal grandfather and sister; Cancer - Colon in his sister; Healthy in his child and child; Other in his child; Stroke in his father.    ROS:  Please see the history of present illness.    Review of Systems: Constitutional:  denies fever, chills, diaphoresis, appetite change and fatigue.  HEENT: denies photophobia, eye pain, redness, hearing loss, ear pain, congestion, sore throat, rhinorrhea, sneezing, neck pain, neck stiffness and tinnitus.  Respiratory: denies SOB, DOE, cough, chest tightness, and wheezing.  Cardiovascular: denies chest pain, palpitations and leg swelling.  Gastrointestinal: denies nausea, vomiting, abdominal pain, diarrhea, constipation, blood in stool.  Genitourinary: denies dysuria, urgency, frequency, hematuria, flank pain and difficulty urinating.  Musculoskeletal: denies  myalgias, back pain, joint swelling, arthralgias and gait problem.   Skin: denies pallor, rash and wound.  Neurological: denies dizziness, seizures, syncope, weakness, light-headedness, numbness and headaches.   Hematological: denies adenopathy, easy bruising, personal or family bleeding history.  Psychiatric/ Behavioral: denies suicidal ideation, mood changes, confusion, nervousness, sleep disturbance and agitation.       All other systems are reviewed and negative.    PHYSICAL EXAM: VS:  BP 126/70 mmHg  Pulse 54  Ht 5' 7.5" (1.715 m)  Wt 215 lb 6.4 oz (97.705 kg)  BMI 33.22 kg/m2 ,  BMI Body mass index is 33.22 kg/(m^2). GEN: Well nourished, well developed, in no acute distress HEENT: normal Neck: no JVD, carotid bruits, or masses Cardiac:  Irreg. Irreg    Soft systolic murmur,  No rubs, or gallops,no edema  Respiratory:   Has wheezes - particularly on the right lower lung field  GI: soft, nontender, nondistended, + BS MS: no deformity or atrophy Leg pulses are 2+   Skin: warm and dry, no rash Neuro:  Strength and sensation are intact  Psych: normal   EKG:  EKG is not ordered today.  Recent Labs: 12/10/2015: ALT 24; Hemoglobin 13.8; Platelets 116* 03/07/2016: BUN 33*; Creat 1.30*; Potassium 3.9; Sodium 139    Lipid Panel No results found for: CHOL, TRIG, HDL, CHOLHDL, VLDL, LDLCALC, LDLDIRECT    Wt Readings from Last 3 Encounters:  05/18/16 215 lb 6.4 oz (97.705 kg)  04/07/16 214 lb 1.1 oz (97.1 kg)  02/24/16 219 lb 6.4 oz (99.519 kg)      Other studies Reviewed: Additional studies/ records that were reviewed today include: . Review of the above records demonstrates:    ASSESSMENT AND PLAN:  1.  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 Walks and works in his garden without any problems   2. Exertional leg fatigue:  Still has weakness.  Arterial duplex was normal   3. Atrial fib - on eliquis .  CHADS2VASC =  5   ( age 89, DM, HTN, vasc )  Echo shows normal LV systolic function  Mild AS  Will continue with rate control and anticoagulation  He is basically asymptomatic.    Will hold off on cardioversion at this point    4. Chronic diastolic CHF   breathing is good.  Is on lasix and kdur  Doing well   Current medicines are reviewed at length with the patient today.  The patient does not have concerns regarding medicines.  The following changes have been made:  no change  Labs/ tests ordered today include:  No orders of the defined types were placed in this encounter.     Disposition:   FU with 6 months      [name] MissPhilip Steffani Dionisio, MD    05/18/2016 9:43 AM    [facility] [address], [place], [place]  [address] Phone: [phone](336) [phone]; Fax: [phone]   [facility]  [address] [place], [place]  [address] [phone]   Fax [phone]

## 2016-05-18 NOTE — Patient Instructions (Signed)
Medication Instructions:  Your physician recommends that you continue on your current medications as directed. Please refer to the Current Medication list given to you today.   Labwork: TODAY - CBC, basic metabolic panel   Testing/Procedures: None Ordered   Follow-Up: Your physician wants you to follow-up in: 6 months with Dr. Nahser.  You will receive a reminder letter in the mail two months in advance. If you don't receive a letter, please call our office to schedule the follow-up appointment.   If you need a refill on your cardiac medications before your next appointment, please call your pharmacy.   Thank you for choosing [facility]! Michelle Swinyer, RN [phone]    

## 2016-05-20 ENCOUNTER — Encounter (HOSPITAL_COMMUNITY)
Admission: RE | Admit: 2016-05-20 | Discharge: 2016-05-20 | Disposition: A | Discharge status: Home / Self Care | Payer: Medicare Other | Source: Ambulatory Visit | Attending: Cardiovascular Disease | Admitting: Cardiovascular Disease

## 2016-05-20 DIAGNOSIS — I10 Essential (primary) hypertension: Secondary | ICD-10-CM | POA: Diagnosis not present

## 2016-05-20 DIAGNOSIS — Z79899 Other long term (current) drug therapy: Secondary | ICD-10-CM | POA: Diagnosis not present

## 2016-05-20 DIAGNOSIS — E119 Type 2 diabetes mellitus without complications: Secondary | ICD-10-CM | POA: Diagnosis not present

## 2016-05-20 DIAGNOSIS — I208 Other forms of angina pectoris: Secondary | ICD-10-CM | POA: Diagnosis not present

## 2016-05-20 DIAGNOSIS — Z7902 Long term (current) use of antithrombotics/antiplatelets: Secondary | ICD-10-CM | POA: Diagnosis not present

## 2016-05-20 DIAGNOSIS — E785 Hyperlipidemia, unspecified: Secondary | ICD-10-CM | POA: Diagnosis not present

## 2016-05-23 ENCOUNTER — Encounter (HOSPITAL_COMMUNITY)
Admission: RE | Admit: 2016-05-23 | Discharge: 2016-05-23 | Disposition: A | Discharge status: Home / Self Care | Payer: Medicare Other | Source: Ambulatory Visit | Attending: Cardiovascular Disease | Admitting: Cardiovascular Disease

## 2016-05-23 DIAGNOSIS — I10 Essential (primary) hypertension: Secondary | ICD-10-CM | POA: Diagnosis not present

## 2016-05-23 DIAGNOSIS — E119 Type 2 diabetes mellitus without complications: Secondary | ICD-10-CM | POA: Diagnosis not present

## 2016-05-23 DIAGNOSIS — E785 Hyperlipidemia, unspecified: Secondary | ICD-10-CM | POA: Diagnosis not present

## 2016-05-23 DIAGNOSIS — I208 Other forms of angina pectoris: Secondary | ICD-10-CM | POA: Diagnosis not present

## 2016-05-23 DIAGNOSIS — Z79899 Other long term (current) drug therapy: Secondary | ICD-10-CM | POA: Diagnosis not present

## 2016-05-23 DIAGNOSIS — Z7902 Long term (current) use of antithrombotics/antiplatelets: Secondary | ICD-10-CM | POA: Diagnosis not present

## 2016-05-25 ENCOUNTER — Encounter (HOSPITAL_COMMUNITY)
Admission: RE | Admit: 2016-05-25 | Discharge: 2016-05-25 | Disposition: A | Discharge status: Home / Self Care | Payer: Medicare Other | Source: Ambulatory Visit | Attending: Cardiovascular Disease | Admitting: Cardiovascular Disease

## 2016-05-25 DIAGNOSIS — E785 Hyperlipidemia, unspecified: Secondary | ICD-10-CM | POA: Diagnosis not present

## 2016-05-25 DIAGNOSIS — I10 Essential (primary) hypertension: Secondary | ICD-10-CM | POA: Diagnosis not present

## 2016-05-25 DIAGNOSIS — I208 Other forms of angina pectoris: Secondary | ICD-10-CM | POA: Diagnosis not present

## 2016-05-25 DIAGNOSIS — Z7902 Long term (current) use of antithrombotics/antiplatelets: Secondary | ICD-10-CM | POA: Diagnosis not present

## 2016-05-25 DIAGNOSIS — E119 Type 2 diabetes mellitus without complications: Secondary | ICD-10-CM | POA: Diagnosis not present

## 2016-05-25 DIAGNOSIS — Z79899 Other long term (current) drug therapy: Secondary | ICD-10-CM | POA: Diagnosis not present

## 2016-05-26 DIAGNOSIS — R350 Frequency of micturition: Secondary | ICD-10-CM | POA: Diagnosis not present

## 2016-05-26 DIAGNOSIS — R3915 Urgency of urination: Secondary | ICD-10-CM | POA: Diagnosis not present

## 2016-05-27 ENCOUNTER — Encounter (HOSPITAL_COMMUNITY)
Admission: RE | Admit: 2016-05-27 | Discharge: 2016-05-27 | Disposition: A | Discharge status: Home / Self Care | Payer: Medicare Other | Source: Ambulatory Visit | Attending: Cardiovascular Disease | Admitting: Cardiovascular Disease

## 2016-05-27 DIAGNOSIS — E785 Hyperlipidemia, unspecified: Secondary | ICD-10-CM | POA: Diagnosis not present

## 2016-05-27 DIAGNOSIS — E119 Type 2 diabetes mellitus without complications: Secondary | ICD-10-CM | POA: Diagnosis not present

## 2016-05-27 DIAGNOSIS — I10 Essential (primary) hypertension: Secondary | ICD-10-CM | POA: Diagnosis not present

## 2016-05-27 DIAGNOSIS — Z7902 Long term (current) use of antithrombotics/antiplatelets: Secondary | ICD-10-CM | POA: Diagnosis not present

## 2016-05-27 DIAGNOSIS — I208 Other forms of angina pectoris: Secondary | ICD-10-CM | POA: Diagnosis not present

## 2016-05-27 DIAGNOSIS — Z79899 Other long term (current) drug therapy: Secondary | ICD-10-CM | POA: Diagnosis not present

## 2016-05-27 LAB — GLUCOSE, CAPILLARY: GLUCOSE-CAPILLARY: 221 mg/dL — AB (ref 65–99)

## 2016-05-30 ENCOUNTER — Encounter (HOSPITAL_COMMUNITY)
Admission: RE | Admit: 2016-05-30 | Discharge: 2016-05-30 | Disposition: A | Discharge status: Home / Self Care | Payer: Medicare Other | Source: Ambulatory Visit | Attending: Cardiovascular Disease | Admitting: Cardiovascular Disease

## 2016-05-30 DIAGNOSIS — I10 Essential (primary) hypertension: Secondary | ICD-10-CM | POA: Diagnosis not present

## 2016-05-30 DIAGNOSIS — E1129 Type 2 diabetes mellitus with other diabetic kidney complication: Secondary | ICD-10-CM | POA: Diagnosis not present

## 2016-05-30 DIAGNOSIS — I209 Angina pectoris, unspecified: Secondary | ICD-10-CM | POA: Diagnosis not present

## 2016-05-30 DIAGNOSIS — E119 Type 2 diabetes mellitus without complications: Secondary | ICD-10-CM | POA: Diagnosis not present

## 2016-05-30 DIAGNOSIS — I2510 Atherosclerotic heart disease of native coronary artery without angina pectoris: Secondary | ICD-10-CM | POA: Diagnosis not present

## 2016-05-30 DIAGNOSIS — I252 Old myocardial infarction: Secondary | ICD-10-CM | POA: Diagnosis not present

## 2016-05-30 DIAGNOSIS — Z6832 Body mass index (BMI) 32.0-32.9, adult: Secondary | ICD-10-CM | POA: Diagnosis not present

## 2016-05-30 DIAGNOSIS — Z79899 Other long term (current) drug therapy: Secondary | ICD-10-CM | POA: Diagnosis not present

## 2016-05-30 DIAGNOSIS — E7800 Pure hypercholesterolemia, unspecified: Secondary | ICD-10-CM | POA: Diagnosis not present

## 2016-05-30 DIAGNOSIS — I480 Paroxysmal atrial fibrillation: Secondary | ICD-10-CM | POA: Diagnosis not present

## 2016-05-30 DIAGNOSIS — Z7902 Long term (current) use of antithrombotics/antiplatelets: Secondary | ICD-10-CM | POA: Diagnosis not present

## 2016-05-30 DIAGNOSIS — I208 Other forms of angina pectoris: Secondary | ICD-10-CM

## 2016-05-30 DIAGNOSIS — R808 Other proteinuria: Secondary | ICD-10-CM | POA: Diagnosis not present

## 2016-05-30 DIAGNOSIS — N183 Chronic kidney disease, stage 3 (moderate): Secondary | ICD-10-CM | POA: Diagnosis not present

## 2016-05-30 DIAGNOSIS — E785 Hyperlipidemia, unspecified: Secondary | ICD-10-CM | POA: Diagnosis not present

## 2016-05-30 DIAGNOSIS — M1990 Unspecified osteoarthritis, unspecified site: Secondary | ICD-10-CM | POA: Diagnosis not present

## 2016-06-01 ENCOUNTER — Encounter (HOSPITAL_COMMUNITY)
Admission: RE | Admit: 2016-06-01 | Discharge: 2016-06-01 | Disposition: A | Discharge status: Home / Self Care | Payer: Medicare Other | Source: Ambulatory Visit | Attending: Cardiovascular Disease | Admitting: Cardiovascular Disease

## 2016-06-01 DIAGNOSIS — I208 Other forms of angina pectoris: Secondary | ICD-10-CM | POA: Diagnosis not present

## 2016-06-01 DIAGNOSIS — E785 Hyperlipidemia, unspecified: Secondary | ICD-10-CM | POA: Insufficient documentation

## 2016-06-01 DIAGNOSIS — E119 Type 2 diabetes mellitus without complications: Secondary | ICD-10-CM | POA: Diagnosis not present

## 2016-06-01 DIAGNOSIS — I10 Essential (primary) hypertension: Secondary | ICD-10-CM | POA: Insufficient documentation

## 2016-06-01 DIAGNOSIS — Z87891 Personal history of nicotine dependence: Secondary | ICD-10-CM | POA: Diagnosis not present

## 2016-06-01 DIAGNOSIS — Z79899 Other long term (current) drug therapy: Secondary | ICD-10-CM | POA: Diagnosis not present

## 2016-06-01 DIAGNOSIS — Z7902 Long term (current) use of antithrombotics/antiplatelets: Secondary | ICD-10-CM | POA: Insufficient documentation

## 2016-06-03 ENCOUNTER — Encounter (HOSPITAL_COMMUNITY)
Admission: RE | Admit: 2016-06-03 | Discharge: 2016-06-03 | Disposition: A | Discharge status: Home / Self Care | Payer: Medicare Other | Source: Ambulatory Visit | Attending: Cardiovascular Disease | Admitting: Cardiovascular Disease

## 2016-06-03 DIAGNOSIS — E785 Hyperlipidemia, unspecified: Secondary | ICD-10-CM | POA: Diagnosis not present

## 2016-06-03 DIAGNOSIS — I208 Other forms of angina pectoris: Secondary | ICD-10-CM

## 2016-06-03 DIAGNOSIS — I10 Essential (primary) hypertension: Secondary | ICD-10-CM | POA: Diagnosis not present

## 2016-06-03 DIAGNOSIS — E119 Type 2 diabetes mellitus without complications: Secondary | ICD-10-CM | POA: Diagnosis not present

## 2016-06-03 DIAGNOSIS — Z79899 Other long term (current) drug therapy: Secondary | ICD-10-CM | POA: Diagnosis not present

## 2016-06-03 DIAGNOSIS — Z7902 Long term (current) use of antithrombotics/antiplatelets: Secondary | ICD-10-CM | POA: Diagnosis not present

## 2016-06-06 ENCOUNTER — Encounter (HOSPITAL_COMMUNITY)
Admission: RE | Admit: 2016-06-06 | Discharge: 2016-06-06 | Disposition: A | Discharge status: Home / Self Care | Payer: Medicare Other | Source: Ambulatory Visit | Attending: Cardiovascular Disease | Admitting: Cardiovascular Disease

## 2016-06-06 DIAGNOSIS — I208 Other forms of angina pectoris: Secondary | ICD-10-CM | POA: Diagnosis not present

## 2016-06-06 DIAGNOSIS — Z7902 Long term (current) use of antithrombotics/antiplatelets: Secondary | ICD-10-CM | POA: Diagnosis not present

## 2016-06-06 DIAGNOSIS — E785 Hyperlipidemia, unspecified: Secondary | ICD-10-CM | POA: Diagnosis not present

## 2016-06-06 DIAGNOSIS — E119 Type 2 diabetes mellitus without complications: Secondary | ICD-10-CM | POA: Diagnosis not present

## 2016-06-06 DIAGNOSIS — Z79899 Other long term (current) drug therapy: Secondary | ICD-10-CM | POA: Diagnosis not present

## 2016-06-06 DIAGNOSIS — I10 Essential (primary) hypertension: Secondary | ICD-10-CM | POA: Diagnosis not present

## 2016-06-08 ENCOUNTER — Encounter (HOSPITAL_COMMUNITY)
Admission: RE | Admit: 2016-06-08 | Discharge: 2016-06-08 | Disposition: A | Discharge status: Home / Self Care | Payer: Medicare Other | Source: Ambulatory Visit | Attending: Cardiovascular Disease | Admitting: Cardiovascular Disease

## 2016-06-08 DIAGNOSIS — I10 Essential (primary) hypertension: Secondary | ICD-10-CM | POA: Diagnosis not present

## 2016-06-08 DIAGNOSIS — I208 Other forms of angina pectoris: Secondary | ICD-10-CM | POA: Diagnosis not present

## 2016-06-08 DIAGNOSIS — Z7902 Long term (current) use of antithrombotics/antiplatelets: Secondary | ICD-10-CM | POA: Diagnosis not present

## 2016-06-08 DIAGNOSIS — E785 Hyperlipidemia, unspecified: Secondary | ICD-10-CM | POA: Diagnosis not present

## 2016-06-08 DIAGNOSIS — Z79899 Other long term (current) drug therapy: Secondary | ICD-10-CM | POA: Diagnosis not present

## 2016-06-08 DIAGNOSIS — E119 Type 2 diabetes mellitus without complications: Secondary | ICD-10-CM | POA: Diagnosis not present

## 2016-06-10 ENCOUNTER — Encounter (HOSPITAL_COMMUNITY)
Admission: RE | Admit: 2016-06-10 | Discharge: 2016-06-10 | Disposition: A | Discharge status: Home / Self Care | Payer: Medicare Other | Source: Ambulatory Visit | Attending: Cardiovascular Disease | Admitting: Cardiovascular Disease

## 2016-06-10 DIAGNOSIS — I208 Other forms of angina pectoris: Secondary | ICD-10-CM | POA: Diagnosis not present

## 2016-06-10 DIAGNOSIS — E119 Type 2 diabetes mellitus without complications: Secondary | ICD-10-CM | POA: Diagnosis not present

## 2016-06-10 DIAGNOSIS — I10 Essential (primary) hypertension: Secondary | ICD-10-CM | POA: Diagnosis not present

## 2016-06-10 DIAGNOSIS — Z79899 Other long term (current) drug therapy: Secondary | ICD-10-CM | POA: Diagnosis not present

## 2016-06-10 DIAGNOSIS — Z7902 Long term (current) use of antithrombotics/antiplatelets: Secondary | ICD-10-CM | POA: Diagnosis not present

## 2016-06-10 DIAGNOSIS — E785 Hyperlipidemia, unspecified: Secondary | ICD-10-CM | POA: Diagnosis not present

## 2016-06-13 ENCOUNTER — Encounter (HOSPITAL_COMMUNITY)
Admission: RE | Admit: 2016-06-13 | Discharge: 2016-06-13 | Disposition: A | Discharge status: Home / Self Care | Payer: Medicare Other | Source: Ambulatory Visit | Attending: Cardiovascular Disease | Admitting: Cardiovascular Disease

## 2016-06-13 DIAGNOSIS — E119 Type 2 diabetes mellitus without complications: Secondary | ICD-10-CM | POA: Diagnosis not present

## 2016-06-13 DIAGNOSIS — I208 Other forms of angina pectoris: Secondary | ICD-10-CM

## 2016-06-13 DIAGNOSIS — I10 Essential (primary) hypertension: Secondary | ICD-10-CM | POA: Diagnosis not present

## 2016-06-13 DIAGNOSIS — Z7902 Long term (current) use of antithrombotics/antiplatelets: Secondary | ICD-10-CM | POA: Diagnosis not present

## 2016-06-13 DIAGNOSIS — Z79899 Other long term (current) drug therapy: Secondary | ICD-10-CM | POA: Diagnosis not present

## 2016-06-13 DIAGNOSIS — E785 Hyperlipidemia, unspecified: Secondary | ICD-10-CM | POA: Diagnosis not present

## 2016-06-15 ENCOUNTER — Encounter (HOSPITAL_COMMUNITY)
Admission: RE | Admit: 2016-06-15 | Discharge: 2016-06-15 | Disposition: A | Discharge status: Home / Self Care | Payer: Medicare Other | Source: Ambulatory Visit | Attending: Cardiovascular Disease | Admitting: Cardiovascular Disease

## 2016-06-15 DIAGNOSIS — Z7902 Long term (current) use of antithrombotics/antiplatelets: Secondary | ICD-10-CM | POA: Diagnosis not present

## 2016-06-15 DIAGNOSIS — I208 Other forms of angina pectoris: Secondary | ICD-10-CM | POA: Diagnosis not present

## 2016-06-15 DIAGNOSIS — Z79899 Other long term (current) drug therapy: Secondary | ICD-10-CM | POA: Diagnosis not present

## 2016-06-15 DIAGNOSIS — I10 Essential (primary) hypertension: Secondary | ICD-10-CM | POA: Diagnosis not present

## 2016-06-15 DIAGNOSIS — E785 Hyperlipidemia, unspecified: Secondary | ICD-10-CM | POA: Diagnosis not present

## 2016-06-15 DIAGNOSIS — E119 Type 2 diabetes mellitus without complications: Secondary | ICD-10-CM | POA: Diagnosis not present

## 2016-06-15 NOTE — Progress Notes (Signed)
Cardiac Individual Treatment Plan  Patient Details  Name: [name] [name] MRN: [id number] Date of Birth: 18-Feb-1925 Referring Provider:   Flowsheet Row CARDIAC REHAB PHASE II [facility]IENTATION [facility]  Referring Provider  Charlton HawsNishan, Peter, MD      Initial Encounter Date:  Flowsheet Row CARDIAC REHAB PHASE II ORIENTATION from 04/07/2016 in [facility]  Date  04/07/16  Referring Provider  Charlton HawsNishan, Peter, MD      Visit Diagnosis: Stable angina [facility](HCC)  Patient's Home Medications on Admission:  Current Outpatient Prescriptions:  .  alfuzosin (UROXATRAL) 10 MG 24 hr tablet, Take 10 mg by mouth daily with breakfast., Disp: , Rfl:  .  allopurinol (ZYLOPRIM) 100 MG tablet, Take 100 mg by mouth daily., Disp: , Rfl:  .  brinzolamide (AZOPT) 1 % ophthalmic suspension, Place 1 drop into both eyes 2 (two) times daily., Disp: , Rfl:  .  ELIQUIS 5 MG TABS tablet, TAKE 1 TABLET BY MOUTH TWICE A DAY, Disp: 60 tablet, Rfl: 0 .  finasteride (PROSCAR) 5 MG tablet, Take 5 mg by mouth daily., Disp: , Rfl: 3 .  furosemide (LASIX) 40 MG tablet, Take 1 tablet (40 mg total) by mouth daily., Disp: 30 tablet, Rfl: 6 .  glimepiride (AMARYL) 4 MG tablet, Take 4 mg by mouth daily., Disp: , Rfl:  .  isosorbide mononitrate (IMDUR) 60 MG 24 hr tablet, Take 60 mg by mouth daily., Disp: , Rfl:  .  losartan (COZAAR) 100 MG tablet, Take 100 mg by mouth daily., Disp: , Rfl:  .  metFORMIN (GLUMETZA) 1000 MG (MOD) 24 hr tablet, Take 1,000 mg by mouth daily with breakfast., Disp: , Rfl:  .  Multiple Vitamins-Minerals (CENTRUM PO), Take 1 tablet by mouth every other day. , Disp: , Rfl:  .  MYRBETRIQ 50 MG TB24 tablet, Take 50 mg by mouth daily., Disp: , Rfl:  .  nitroGLYCERIN (NITROSTAT) 0.4 MG SL tablet, Place 1 tablet (0.4 mg total) under the tongue every 5 (five) minutes as needed for chest pain., Disp: 25 tablet, Rfl: 6 .  potassium chloride (K-DUR)  10 MEQ tablet, Take 1 tablet (10 mEq total) by mouth daily., Disp: 30 tablet, Rfl: 6 .  RAPAFLO 8 MG CAPS capsule, Take 8 mg by mouth daily., Disp: , Rfl:  .  saxagliptin HCl (ONGLYZA) 5 MG TABS tablet, Take 5 mg by mouth daily., Disp: , Rfl:  .  simvastatin (ZOCOR) 80 MG tablet, Take 40 mg by mouth daily at 6 PM. , Disp: , Rfl:  .  travoprost, benzalkonium, (TRAVATAN) 0.004 % ophthalmic solution, Place 1 drop into both eyes at bedtime., Disp: , Rfl:   Past Medical History: Past Medical History:  Diagnosis Date  . BPH with elevated PSA   . Chest pain   . Chronic kidney disease, stage III (moderate)   . DM (diabetes mellitus), type 2 with renal complications (HCC)   . ED (erectile dysfunction)   . Glaucoma   . Hyperlipidemia   . Hypertension   . Microalbuminuria   . Microalbuminuria 2013  . Obesity   . Osteoarthritis   . Prostatic hypertrophy, benign, with obstruction   . Renal calculus   . Sick sinus syndrome (HCC)     Tobacco Use: History  Smoking Status  . Former Smoker  . Types: Cigarettes  Smokeless Tobacco  . Not on file    Labs: Recent Review Flowsheet Data    There is no  flowsheet data to display.      Capillary Blood Glucose: Lab Results  Component Value Date   GLUCAP 221 (H) 05/27/2016   GLUCAP 152 (H) 05/16/2016   GLUCAP 138 (H) 04/20/2016   GLUCAP 225 (H) 04/18/2016   GLUCAP 191 (H) 04/15/2016     Exercise Target Goals:    Exercise Program Goal: Individual exercise prescription set with THRR, safety & activity barriers. Participant demonstrates ability to understand and report RPE using BORG scale, to self-measure pulse accurately, and to acknowledge the importance of the exercise prescription.  Exercise Prescription Goal: Starting with aerobic activity 30 plus minutes a day, 3 days per week for initial exercise prescription. Provide home exercise prescription and guidelines that participant acknowledges understanding prior to  discharge.  Activity Barriers & Risk Stratification:     Activity Barriers & Cardiac Risk Stratification - 04/07/16 0827      Activity Barriers & Cardiac Risk Stratification   Activity Barriers Left Knee Replacement   Cardiac Risk Stratification High      6 Minute Walk:     6 Minute Walk    Row Name 04/07/16 1018 04/07/16 1118       6 Minute Walk   Phase Initial Initial    Distance  - 856 feet    Walk Time 6 minutes 6 minutes    # of Rest Breaks 0 0    MPH  - 1.6    METS  - 0.5    RPE  - 11    VO2 Peak  - 1.8    Symptoms  - No    Resting HR 60 bpm  -    Resting BP 126/60  -    Max Ex. HR 71 bpm  -    Max Ex. BP 126/62  -    2 Minute Post BP 122/68  -       Initial Exercise Prescription:     Initial Exercise Prescription - 04/07/16 1100      Date of Initial Exercise RX and Referring Provider   Date 04/07/16   Referring Provider Charlton Haws, MD     NuStep   Level 1   Minutes 15   METs 1.2     Arm Ergometer   Level 2   Watts 15   Minutes 15   METs 1     Prescription Details   Frequency (times per week) 3   Duration Progress to 30 minutes of continuous aerobic without signs/symptoms of physical distress     Intensity   THRR 40-80% of Max Heartrate 53-106   Ratings of Perceived Exertion 11-13   Perceived Dyspnea 0-4     Progression   Progression Continue to progress workloads to maintain intensity without signs/symptoms of physical distress.     Resistance Training   Training Prescription Yes   Weight 1   Reps 10-12      Perform Capillary Blood Glucose checks as needed.  Exercise Prescription Changes:     Exercise Prescription Changes    Row Name 04/29/16 1600 05/09/16 1700 05/25/16 1600 06/06/16 1600       Exercise Review   Progression Yes Yes Yes Yes      Response to Exercise   Blood Pressure (Admit) 140/80 126/72 112/62 116/60    Blood Pressure (Exercise) 140/80 132/60 122/60 120/70    Blood Pressure (Exit) 132/84 122/60  128/60 120/70    Heart Rate (Admit) 73 bpm 66 bpm 74 bpm 53 bpm    Heart  Rate (Exercise) 112 bpm 84 bpm 88 bpm 88 bpm    Heart Rate (Exit) 70 bpm 61 bpm 66 bpm 63 bpm    Rating of Perceived Exertion (Exercise) 12 13 15 13     Comments  - Rviewed HEP on 623/17 Rviewed HEP on 623/17 Rviewed HEP on 623/17    Duration Progress to 30 minutes of continuous aerobic without signs/symptoms of physical distress Progress to 30 minutes of continuous aerobic without signs/symptoms of physical distress Progress to 30 minutes of continuous aerobic without signs/symptoms of physical distress Progress to 30 minutes of continuous aerobic without signs/symptoms of physical distress    Intensity THRR unchanged THRR unchanged THRR unchanged THRR unchanged      Progression   Average METs 2.1 2.4 2.6 2.8      Resistance Training   Training Prescription Yes Yes Yes Yes    Weight 2lbs 4lbs 4lbs 4lbs    Reps 10-12 10-12 10-12 10-12      Recumbant Bike   Level  -  -  - 2.5    Watts  -  -  - 42    Minutes  -  -  - 10    METs  -  -  - 2.4      NuStep   Level 2 2 3 4     Minutes 15 15 4 10     METs 2.1 2.5 3.2 3.2      Arm Ergometer   Level 2 2 2 3     Watts 15 15 15 15     Minutes 15 15 15 10     METs 1 1 1 1       Home Exercise Plan   Plans to continue exercise at  - Home  see progress note for more details Home  see progress note for more details Home  see progress note for more details    Frequency  - Add 2 additional days to program exercise sessions. Add 2 additional days to program exercise sessions. Add 2 additional days to program exercise sessions.       Exercise Comments:     Exercise Comments    Row Name 05/09/16 1732 06/10/16 1619         Exercise Comments Reviewed METs and goals; pt is tolerating exercise well; will continue to monitor exercise progression Reviewed METs and goals; pt is tolerating exercise well; will continue to monitor exercise progression         Discharge Exercise  Prescription (Final Exercise Prescription Changes):     Exercise Prescription Changes - 06/06/16 1600      Exercise Review   Progression Yes     Response to Exercise   Blood Pressure (Admit) 116/60   Blood Pressure (Exercise) 120/70   Blood Pressure (Exit) 120/70   Heart Rate (Admit) 53 bpm   Heart Rate (Exercise) 88 bpm   Heart Rate (Exit) 63 bpm   Rating of Perceived Exertion (Exercise) 13   Comments Rviewed HEP on 623/17   Duration Progress to 30 minutes of continuous aerobic without signs/symptoms of physical distress   Intensity THRR unchanged     Progression   Average METs 2.8     Resistance Training   Training Prescription Yes   Weight 4lbs   Reps 10-12     Recumbant Bike   Level 2.5   Watts 42   Minutes 10   METs 2.4     NuStep   Level 4   Minutes 10   METs 3.2  Arm Ergometer   Level 3   Watts 15   Minutes 10   METs 1     Home Exercise Plan   Plans to continue exercise at Home  see progress note for more details   Frequency Add 2 additional days to program exercise sessions.      Nutrition:  Target Goals: Understanding of nutrition guidelines, daily intake of sodium 1500mg , cholesterol 200mg , calories 30% from fat and 7% or less from saturated fats, daily to have 5 or more servings of fruits and vegetables.  Biometrics:     Pre Biometrics - 04/07/16 1116      Pre Biometrics   % Body Fat 33.5 %       Nutrition Therapy Plan and Nutrition Goals:     Nutrition Therapy & Goals - 04/11/16 1520      Nutrition Therapy   Diet Diabetic, Therapeutic Lifestyle Changes     Personal Nutrition Goals   Personal Goal #1 0.5-2 lb wt loss per week to a wt loss goal of 6-10 lb at graduation from Cardiac Rehab     Intervention Plan   Intervention Prescribe, educate and counsel regarding individualized specific dietary modifications aiming towards targeted core components such as weight, hypertension, lipid management, diabetes, heart failure and  other comorbidities.   Expected Outcomes Short Term Goal: Understand basic principles of dietary content, such as calories, fat, sodium, cholesterol and nutrients.;Long Term Goal: Adherence to prescribed nutrition plan.      Nutrition Discharge: Nutrition Scores:     Nutrition Assessments - 05/06/16 1049      MEDFICTS Scores   Pre Score 51      Nutrition Goals Re-Evaluation:   Psychosocial: Target Goals: Acknowledge presence or absence of depression, maximize coping skills, provide positive support system. Participant is able to verbalize types and ability to use techniques and skills needed for reducing stress and depression.  Initial Review & Psychosocial Screening:     Initial Psych Review & Screening - 04/11/16 1720      Family Dynamics   Good Support System? Yes     Barriers   Psychosocial barriers to participate in program The patient should benefit from training in stress management and relaxation.     Screening Interventions   Interventions Encouraged to exercise      Quality of Life Scores:     Quality of Life - 04/20/16 1702      Quality of Life Scores   GLOBAL Pre --  quality of life reviewed with patient.  please see progress notes       PHQ-9: Recent Review Flowsheet Data    There is no flowsheet data to display.      Psychosocial Evaluation and Intervention:     Psychosocial Evaluation - 04/20/16 1535      Psychosocial Evaluation & Interventions   Interventions Stress management education;Relaxation education;Encouraged to exercise with the program and follow exercise prescription   Continued Psychosocial Services Needed Yes      Psychosocial Re-Evaluation:     Psychosocial Re-Evaluation    Row Name 05/11/16 1720 06/15/16 1722           Psychosocial Re-Evaluation   Interventions Stress management education;Relaxation education;Encouraged to attend Cardiac Rehabilitation for the exercise Stress management education;Relaxation  education;Encouraged to attend Cardiac Rehabilitation for the exercise      Comments pt is exercising on his own at home walking, pt continues to particpate in heavy yardwork.   pt is exercising on his own at home  and is doing yardwork. pt is actively participating in group cardiac rehab activities       Continued Psychosocial Services Needed Yes No         Vocational Rehabilitation: Provide vocational rehab assistance to qualifying candidates.   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 Participant will state understanding/return demonstration of topics presented.  Learning Barriers/Preferences:     Learning Barriers/Preferences - 04/07/16 0827      Learning Barriers/Preferences   Learning Barriers Sight   Learning Preferences Written Material;Pictoral      Education Topics: Count Your Pulse:  -Group instruction provided by verbal instruction, demonstration, patient participation and written materials to support subject.  Instructors address importance of being able to find your pulse and how to count your pulse when at home without a heart monitor.  Patients get hands on experience counting their pulse with staff help and individually. Flowsheet Row CARDIAC REHAB PHASE II EXERCISE from 06/10/2016 in [facility] CARDIAC REHAB  Date  05/06/16  Educator  Karlene Lineman, RN  Instruction Review Code  2- meets goals/outcomes      Heart Attack, Angina, and Risk Factor Modification:  -Group instruction provided by verbal instruction, video, and written materials to support subject.  Instructors address signs and symptoms of angina and heart  attacks.    Also discuss risk factors for heart disease and how to make changes to improve heart health risk factors. Flowsheet Row CARDIAC REHAB PHASE II EXERCISE from 06/10/2016 in [facility] CARDIAC REHAB  Date  04/22/16  Instruction Review Code  2- meets goals/outcomes      Functional Fitness:  -Group instruction provided by verbal instruction, demonstration, patient participation, and written materials to support subject.  Instructors address safety measures for doing things around the house.  Discuss how to get up and down off the floor, how to pick things up properly, how to safely get out of a chair without assistance, and balance training. Flowsheet Row CARDIAC REHAB PHASE II EXERCISE from 06/10/2016 in [facility] CARDIAC REHAB  Date  05/20/16  Instruction Review Code  2- meets goals/outcomes      Meditation and Mindfulness:  -Group instruction provided by verbal instruction, patient participation, and written materials to support subject.  Instructor addresses importance of mindfulness and meditation practice to help reduce stress and improve awareness.  Instructor also leads participants through a meditation exercise.  Flowsheet Row CARDIAC REHAB PHASE II EXERCISE from 06/10/2016 in [facility] CARDIAC REHAB  Date  05/04/16  Instruction Review Code  2- meets goals/outcomes      Stretching for Flexibility and Mobility:  -Group instruction provided by verbal instruction, patient participation, and written materials to support subject.  Instructors lead participants through series of stretches that are designed to increase flexibility thus improving mobility.  These stretches are additional exercise for major muscle groups that are typically performed during regular warm up and cool down. Flowsheet Row CARDIAC REHAB PHASE II EXERCISE from 06/10/2016 in [facility] CARDIAC REHAB  Date  04/20/16  Instruction Review  Code  2- meets goals/outcomes      Hands Only  CPR Anytime:  -Group instruction provided by verbal instruction, video, patient participation and written materials to support subject.  Instructors co-teach with [facility] video for hands only CPR.  Participants get hands on experience with mannequins. Flowsheet Row CARDIAC REHAB PHASE II EXERCISE from 06/10/2016 in [facility] CARDIAC REHAB  Date  04/15/16  Instruction Review Code  2- meets goals/outcomes      Nutrition I class: Heart Healthy Eating:  -Group instruction provided by PowerPoint slides, verbal discussion, and written materials to support subject matter. The instructor gives an explanation and review of the Therapeutic Lifestyle Changes diet recommendations, which includes a discussion on lipid goals, dietary fat, sodium, fiber, plant stanol/sterol esters, sugar, and the components of a well-balanced, healthy diet. Flowsheet Row CARDIAC REHAB PHASE II EXERCISE from 06/10/2016 in [facility] CARDIAC REHAB  Date  05/06/16  Educator  RD  Instruction Review Code  Not applicable [class handout given]      Nutrition II class: Lifestyle Skills:  -Group instruction provided by PowerPoint slides, verbal discussion, and written materials to support subject matter. The instructor gives an explanation and review of label reading, grocery shopping for heart health, heart healthy recipe modifications, and ways to make healthier choices when eating out. Flowsheet Row CARDIAC REHAB PHASE II EXERCISE from 06/10/2016 in [facility] CARDIAC REHAB  Date  05/06/16  Educator  RD  Instruction Review Code  Not applicable [class handouts given]      Diabetes Question & Answer:  -Group instruction provided by PowerPoint slides, verbal discussion, and written materials to support subject matter. The instructor gives an explanation and review of diabetes co-morbidities, pre- and post-prandial blood glucose  goals, pre-exercise blood glucose goals, signs, symptoms, and treatment of hypoglycemia and hyperglycemia, and foot care basics. Flowsheet Row CARDIAC REHAB PHASE II EXERCISE from 06/10/2016 in [facility] CARDIAC REHAB  Date  06/10/16  Educator  RD  Instruction Review Code  2- meets goals/outcomes      Diabetes Blitz:  -Group instruction provided by PowerPoint slides, verbal discussion, and written materials to support subject matter. The instructor gives an explanation and review of the physiology behind type 1 and type 2 diabetes, diabetes medications and rational behind using different medications, pre- and post-prandial blood glucose recommendations and Hemoglobin A1c goals, diabetes diet, and exercise including blood glucose guidelines for exercising safely.  Flowsheet Row CARDIAC REHAB PHASE II EXERCISE from 06/10/2016 in [facility] CARDIAC REHAB  Date  05/06/16  Educator  RD  Instruction Review Code  Not applicable [class handout given]      Portion Distortion:  -Group instruction provided by PowerPoint slides, verbal discussion, written materials, and food models to support subject matter. The instructor gives an explanation of serving size versus portion size, changes in portions sizes over the last 20 years, and what consists of a serving from each food group. Flowsheet Row CARDIAC REHAB PHASE II EXERCISE from 06/10/2016 in [facility] CARDIAC REHAB  Date  06/08/16  Educator  RD  Instruction Review Code  2- meets goals/outcomes      Stress Management:  -Group instruction provided by verbal instruction, video, and written materials to support subject matter.  Instructors review role of stress in heart disease and how to cope with stress positively.   Flowsheet Row CARDIAC REHAB PHASE II EXERCISE from 06/10/2016 in [facility] CARDIAC REHAB  Date  06/01/16  Instruction Review Code  2- meets goals/outcomes  Exercising on Your Own:  -Group instruction provided by verbal instruction, power point, and written materials to support subject.  Instructors discuss benefits of exercise, components of exercise, frequency and intensity of exercise, and end points for exercise.  Also discuss use of nitroglycerin and activating EMS.  Review options of places to exercise outside of rehab.  Review guidelines for sex with heart disease. Flowsheet Row CARDIAC REHAB PHASE [facility]  Date  05/25/16  Instruction Review Code  2- meets goals/outcomes      Cardiac Drugs I:  -Group instruction provided by verbal instruction and written materials to support subject.  Instructor reviews cardiac drug classes: antiplatelets, anticoagulants, beta blockers, and statins.  Instructor discusses reasons, side effects, and lifestyle considerations for each drug class.   Cardiac Drugs II:  -Group instruction provided by verbal instruction and written materials to support subject.  Instructor reviews cardiac drug classes: angiotensin converting enzyme inhibitors (ACE-I), angiotensin II receptor blockers (ARBs), nitrates, and calcium channel blockers.  Instructor discusses reasons, side effects, and lifestyle considerations for each drug class.   Anatomy and Physiology of the Circulatory System:  -Group instruction provided by verbal instruction, video, and written materials to support subject.  Reviews functional anatomy of heart, how it relates to various diagnoses, and what role the heart plays in the overall system.   Knowledge Questionnaire Score:     Knowledge Questionnaire Score - 04/07/16 1116      Knowledge Questionnaire Score   Pre Score 20/24      Core Components/Risk Factors/Patient Goals at Admission:     Personal Goals and Risk Factors at Admission - 04/07/16 0827      Core Components/Risk Factors/Patient Goals on Admission    Weight  Management Yes   Intervention Weight Management: Develop a combined nutrition and exercise program designed to reach desired caloric intake, while maintaining appropriate intake of nutrient and fiber, sodium and fats, and appropriate energy expenditure required for the weight goal.;Weight Management: Provide education and appropriate resources to help participant work on and attain dietary goals.;Obesity: Provide education and appropriate resources to help participant work on and attain dietary goals.;Weight Management/Obesity: Establish reasonable short term and long term weight goals.   Expected Outcomes Short Term: Continue to assess and modify interventions until short term weight is achieved;Long Term: Adherence to nutrition and physical activity/exercise program aimed toward attainment of established weight goal;Weight Maintenance: Understanding of the daily nutrition guidelines, which includes 25-35% calories from fat, 7% or less cal from saturated fats, less than 200mg  cholesterol, less than 1.5gm of sodium, & 5 or more servings of fruits and vegetables daily;Weight Loss: Understanding of general recommendations for a balanced deficit meal plan, which promotes 1-2 lb weight loss per week and includes a negative energy balance of [phone] kcal/d;Understanding recommendations for meals to include 15-35% energy as protein, 25-35% energy from fat, 35-60% energy from carbohydrates, less than 200mg  of dietary cholesterol, 20-35 gm of total fiber daily;Understanding of distribution of calorie intake throughout the day with the consumption of 4-5 meals/snacks   Sedentary Yes   Intervention Provide advice, education, support and counseling about physical activity/exercise needs.;Develop an individualized exercise prescription for aerobic and resistive training based on initial evaluation findings, risk stratification, comorbidities and participant's personal goals.   Expected Outcomes Achievement of increased  cardiorespiratory fitness and enhanced flexibility, muscular endurance and strength shown through measurements of functional capacity and personal statement of participant.   Increase Strength and Stamina Yes  Intervention Provide advice, education, support and counseling about physical activity/exercise needs.;Develop an individualized exercise prescription for aerobic and resistive training based on initial evaluation findings, risk stratification, comorbidities and participant's personal goals.   Expected Outcomes Achievement of increased cardiorespiratory fitness and enhanced flexibility, muscular endurance and strength shown through measurements of functional capacity and personal statement of participant.   Improve shortness of breath with ADL's Yes   Intervention Provide education, individualized exercise plan and daily activity instruction to help decrease symptoms of SOB with activities of daily living.   Expected Outcomes Short Term: Achieves a reduction of symptoms when performing activities of daily living.   Diabetes Yes   Intervention Provide education about proper nutrition, including hydration, and aerobic/resistive exercise prescription along with prescribed medications to achieve blood glucose in normal ranges: Fasting glucose 65-99 mg/dL;Provide education about signs/symptoms and action to take for hypo/hyperglycemia.   Expected Outcomes Short Term: Participant verbalizes understanding of the signs/symptoms and immediate care of hyper/hypoglycemia, proper foot care and importance of medication, aerobic/resistive exercise and nutrition plan for blood glucose control.   Personal Goal Other Yes   Personal Goal short: get back to yardwork, gardening and planting  long: be able to walk longer distances with less SOB   Intervention provide exercising programming to increase endurance and stamina to assist with gardening and walking longer distances   Expected Outcomes be able to garden and  plant and walk further      Core Components/Risk Factors/Patient Goals Review:      Goals and Risk Factor Review    Row Name 05/09/16 1731 05/09/16 1732 06/10/16 1619         Core Components/Risk Factors/Patient Goals Review   Personal Goals Review Other;Improve shortness of breath with ADL's  -  -     Review pt is back to doing yard work without any complications or issues  - Pt is walking and doing household chores, weeding and planting without difficulty or SOB     Expected Outcomes pt will be able to walk longer and do yardwork on her own pt will be able to walk longer and do yardwork on his own pt will continue to take care of garden on his own        Core Components/Risk Factors/Patient Goals at Discharge (Final Review):      Goals and Risk Factor Review - 06/10/16 1619      Core Components/Risk Factors/Patient Goals Review   Review Pt is walking and doing household chores, weeding and planting without difficulty or SOB   Expected Outcomes pt will continue to take care of garden on his own      ITP Comments:     ITP Comments    Row Name 04/07/16 0800 06/03/16 0815         ITP Comments Dr. Armanda Magic, Medical Director attended Hypertension education video/lecture;  meets goals/outcomes         Comments: Pt is making expected progress toward personal goals after completing 27 sessions. Recommend continued exercise and life style modification education including  stress management and relaxation techniques to decrease cardiac risk profile.

## 2016-06-17 ENCOUNTER — Encounter (HOSPITAL_COMMUNITY)
Admission: RE | Admit: 2016-06-17 | Discharge: 2016-06-17 | Disposition: A | Discharge status: Home / Self Care | Payer: Medicare Other | Source: Ambulatory Visit | Attending: Cardiovascular Disease | Admitting: Cardiovascular Disease

## 2016-06-17 DIAGNOSIS — E119 Type 2 diabetes mellitus without complications: Secondary | ICD-10-CM | POA: Diagnosis not present

## 2016-06-17 DIAGNOSIS — I208 Other forms of angina pectoris: Secondary | ICD-10-CM | POA: Diagnosis not present

## 2016-06-17 DIAGNOSIS — Z79899 Other long term (current) drug therapy: Secondary | ICD-10-CM | POA: Diagnosis not present

## 2016-06-17 DIAGNOSIS — Z7902 Long term (current) use of antithrombotics/antiplatelets: Secondary | ICD-10-CM | POA: Diagnosis not present

## 2016-06-17 DIAGNOSIS — I10 Essential (primary) hypertension: Secondary | ICD-10-CM | POA: Diagnosis not present

## 2016-06-17 DIAGNOSIS — E785 Hyperlipidemia, unspecified: Secondary | ICD-10-CM | POA: Diagnosis not present

## 2016-06-20 ENCOUNTER — Encounter (HOSPITAL_COMMUNITY)
Admission: RE | Admit: 2016-06-20 | Discharge: 2016-06-20 | Disposition: A | Discharge status: Home / Self Care | Payer: Medicare Other | Source: Ambulatory Visit | Attending: Cardiovascular Disease | Admitting: Cardiovascular Disease

## 2016-06-20 DIAGNOSIS — Z7902 Long term (current) use of antithrombotics/antiplatelets: Secondary | ICD-10-CM | POA: Diagnosis not present

## 2016-06-20 DIAGNOSIS — I208 Other forms of angina pectoris: Secondary | ICD-10-CM

## 2016-06-20 DIAGNOSIS — I10 Essential (primary) hypertension: Secondary | ICD-10-CM | POA: Diagnosis not present

## 2016-06-20 DIAGNOSIS — E119 Type 2 diabetes mellitus without complications: Secondary | ICD-10-CM | POA: Diagnosis not present

## 2016-06-20 DIAGNOSIS — Z79899 Other long term (current) drug therapy: Secondary | ICD-10-CM | POA: Diagnosis not present

## 2016-06-20 DIAGNOSIS — E785 Hyperlipidemia, unspecified: Secondary | ICD-10-CM | POA: Diagnosis not present

## 2016-06-22 ENCOUNTER — Encounter (HOSPITAL_COMMUNITY)
Admission: RE | Admit: 2016-06-22 | Discharge: 2016-06-22 | Disposition: A | Discharge status: Home / Self Care | Payer: Medicare Other | Source: Ambulatory Visit | Attending: Cardiovascular Disease | Admitting: Cardiovascular Disease

## 2016-06-22 DIAGNOSIS — Z7902 Long term (current) use of antithrombotics/antiplatelets: Secondary | ICD-10-CM | POA: Diagnosis not present

## 2016-06-22 DIAGNOSIS — Z79899 Other long term (current) drug therapy: Secondary | ICD-10-CM | POA: Diagnosis not present

## 2016-06-22 DIAGNOSIS — I208 Other forms of angina pectoris: Secondary | ICD-10-CM | POA: Diagnosis not present

## 2016-06-22 DIAGNOSIS — E785 Hyperlipidemia, unspecified: Secondary | ICD-10-CM | POA: Diagnosis not present

## 2016-06-22 DIAGNOSIS — E119 Type 2 diabetes mellitus without complications: Secondary | ICD-10-CM | POA: Diagnosis not present

## 2016-06-22 DIAGNOSIS — I10 Essential (primary) hypertension: Secondary | ICD-10-CM | POA: Diagnosis not present

## 2016-06-24 ENCOUNTER — Encounter (HOSPITAL_COMMUNITY)
Admission: RE | Admit: 2016-06-24 | Discharge: 2016-06-24 | Disposition: A | Discharge status: Home / Self Care | Payer: Medicare Other | Source: Ambulatory Visit | Attending: Cardiovascular Disease | Admitting: Cardiovascular Disease

## 2016-06-24 DIAGNOSIS — Z79899 Other long term (current) drug therapy: Secondary | ICD-10-CM | POA: Diagnosis not present

## 2016-06-24 DIAGNOSIS — E785 Hyperlipidemia, unspecified: Secondary | ICD-10-CM | POA: Diagnosis not present

## 2016-06-24 DIAGNOSIS — I10 Essential (primary) hypertension: Secondary | ICD-10-CM | POA: Diagnosis not present

## 2016-06-24 DIAGNOSIS — I208 Other forms of angina pectoris: Secondary | ICD-10-CM | POA: Diagnosis not present

## 2016-06-24 DIAGNOSIS — Z7902 Long term (current) use of antithrombotics/antiplatelets: Secondary | ICD-10-CM | POA: Diagnosis not present

## 2016-06-24 DIAGNOSIS — E119 Type 2 diabetes mellitus without complications: Secondary | ICD-10-CM | POA: Diagnosis not present

## 2016-06-27 ENCOUNTER — Encounter (HOSPITAL_COMMUNITY)
Admission: RE | Admit: 2016-06-27 | Discharge: 2016-06-27 | Disposition: A | Discharge status: Home / Self Care | Payer: Medicare Other | Source: Ambulatory Visit | Attending: Cardiovascular Disease | Admitting: Cardiovascular Disease

## 2016-06-27 DIAGNOSIS — I10 Essential (primary) hypertension: Secondary | ICD-10-CM | POA: Diagnosis not present

## 2016-06-27 DIAGNOSIS — E785 Hyperlipidemia, unspecified: Secondary | ICD-10-CM | POA: Diagnosis not present

## 2016-06-27 DIAGNOSIS — Z79899 Other long term (current) drug therapy: Secondary | ICD-10-CM | POA: Diagnosis not present

## 2016-06-27 DIAGNOSIS — E119 Type 2 diabetes mellitus without complications: Secondary | ICD-10-CM | POA: Diagnosis not present

## 2016-06-27 DIAGNOSIS — I208 Other forms of angina pectoris: Secondary | ICD-10-CM

## 2016-06-27 DIAGNOSIS — Z7902 Long term (current) use of antithrombotics/antiplatelets: Secondary | ICD-10-CM | POA: Diagnosis not present

## 2016-06-29 ENCOUNTER — Encounter (HOSPITAL_COMMUNITY)
Admission: RE | Admit: 2016-06-29 | Discharge: 2016-06-29 | Disposition: A | Discharge status: Home / Self Care | Payer: Medicare Other | Source: Ambulatory Visit | Attending: Cardiovascular Disease | Admitting: Cardiovascular Disease

## 2016-06-29 DIAGNOSIS — I208 Other forms of angina pectoris: Secondary | ICD-10-CM

## 2016-06-29 DIAGNOSIS — Z79899 Other long term (current) drug therapy: Secondary | ICD-10-CM | POA: Diagnosis not present

## 2016-06-29 DIAGNOSIS — I10 Essential (primary) hypertension: Secondary | ICD-10-CM | POA: Diagnosis not present

## 2016-06-29 DIAGNOSIS — Z7902 Long term (current) use of antithrombotics/antiplatelets: Secondary | ICD-10-CM | POA: Diagnosis not present

## 2016-06-29 DIAGNOSIS — E119 Type 2 diabetes mellitus without complications: Secondary | ICD-10-CM | POA: Diagnosis not present

## 2016-06-29 DIAGNOSIS — E785 Hyperlipidemia, unspecified: Secondary | ICD-10-CM | POA: Diagnosis not present

## 2016-07-01 ENCOUNTER — Encounter (HOSPITAL_COMMUNITY)
Admission: RE | Admit: 2016-07-01 | Discharge: 2016-07-01 | Disposition: A | Discharge status: Home / Self Care | Payer: Medicare Other | Source: Ambulatory Visit | Attending: Cardiovascular Disease | Admitting: Cardiovascular Disease

## 2016-07-01 DIAGNOSIS — I208 Other forms of angina pectoris: Secondary | ICD-10-CM

## 2016-07-01 DIAGNOSIS — Z87891 Personal history of nicotine dependence: Secondary | ICD-10-CM | POA: Diagnosis not present

## 2016-07-01 DIAGNOSIS — E785 Hyperlipidemia, unspecified: Secondary | ICD-10-CM | POA: Diagnosis not present

## 2016-07-01 DIAGNOSIS — Z79899 Other long term (current) drug therapy: Secondary | ICD-10-CM | POA: Diagnosis not present

## 2016-07-01 DIAGNOSIS — E119 Type 2 diabetes mellitus without complications: Secondary | ICD-10-CM | POA: Diagnosis not present

## 2016-07-01 DIAGNOSIS — Z7902 Long term (current) use of antithrombotics/antiplatelets: Secondary | ICD-10-CM | POA: Insufficient documentation

## 2016-07-01 DIAGNOSIS — I10 Essential (primary) hypertension: Secondary | ICD-10-CM | POA: Insufficient documentation

## 2016-07-06 ENCOUNTER — Encounter (HOSPITAL_COMMUNITY): Payer: Self-pay

## 2016-07-06 ENCOUNTER — Encounter (HOSPITAL_COMMUNITY)
Admission: RE | Admit: 2016-07-06 | Discharge: 2016-07-06 | Disposition: A | Discharge status: Home / Self Care | Payer: Medicare Other | Source: Ambulatory Visit | Attending: Cardiovascular Disease | Admitting: Cardiovascular Disease

## 2016-07-06 VITALS — Wt 210.5 lb

## 2016-07-06 DIAGNOSIS — I10 Essential (primary) hypertension: Secondary | ICD-10-CM | POA: Diagnosis not present

## 2016-07-06 DIAGNOSIS — E785 Hyperlipidemia, unspecified: Secondary | ICD-10-CM | POA: Diagnosis not present

## 2016-07-06 DIAGNOSIS — E119 Type 2 diabetes mellitus without complications: Secondary | ICD-10-CM | POA: Diagnosis not present

## 2016-07-06 DIAGNOSIS — I208 Other forms of angina pectoris: Secondary | ICD-10-CM

## 2016-07-06 DIAGNOSIS — Z7902 Long term (current) use of antithrombotics/antiplatelets: Secondary | ICD-10-CM | POA: Diagnosis not present

## 2016-07-06 DIAGNOSIS — Z79899 Other long term (current) drug therapy: Secondary | ICD-10-CM | POA: Diagnosis not present

## 2016-07-06 NOTE — Progress Notes (Addendum)
Discharge Summary  Patient Details  Name: [name] [name] MRN: [id number] Date of Birth: 1925-06-02 Referring Provider:   Flowsheet Row CARDIAC REHAB PHASE II ORIENTATION from 04/07/2016 in [facility]  Referring Provider  Charlton Haws, MD       Number of Visits: 36   Reason for Discharge:  Patient reached a stable level of exercise.   Pt has increased his physical strength, stamina and stability. Pt has noticed decreased unsteadiness with yard and garden work. Pt reports he is able to tolerate walking longer distances with learning to slower his pace for increased stamina. Pt plans to exercise on his own by continuing walking in his yard, doing lawn and garden work.     Smoking History:  History  Smoking Status  . Former Smoker  . Types: Cigarettes  Smokeless Tobacco  . Not on file    Diagnosis:  Stable angina (HCC)  ADL UCSD:   Initial Exercise Prescription:     Initial Exercise Prescription - 04/07/16 1100      Date of Initial Exercise RX and Referring Provider   Date 04/07/16   Referring Provider Charlton Haws, MD     NuStep   Level 1   Minutes 15   METs 1.2     Arm Ergometer   Level 2   Watts 15   Minutes 15   METs 1     Prescription Details   Frequency (times per week) 3   Duration Progress to 30 minutes of continuous aerobic without signs/symptoms of physical distress     Intensity   THRR 40-80% of Max Heartrate 53-106   Ratings of Perceived Exertion 11-13   Perceived Dyspnea 0-4     Progression   Progression Continue to progress workloads to maintain intensity without signs/symptoms of physical distress.     Resistance Training   Training Prescription Yes   Weight 1   Reps 10-12      Discharge Exercise Prescription (Final Exercise Prescription Changes):     Exercise Prescription Changes - 07/06/16 1218      Exercise Review   Progression Yes     Response to Exercise   Blood Pressure (Admit) 112/54    Blood Pressure (Exercise) 120/60   Blood Pressure (Exit) 112/70   Heart Rate (Admit) 76 bpm   Heart Rate (Exercise) 84 bpm   Heart Rate (Exit) 57 bpm   Rating of Perceived Exertion (Exercise) 12   Comments Rviewed HEP on 623/17   Duration Progress to 30 minutes of continuous aerobic without signs/symptoms of physical distress   Intensity THRR unchanged     Progression   Progression Continue to progress workloads to maintain intensity without signs/symptoms of physical distress.   Average METs 3.3     Resistance Training   Training Prescription Yes   Weight 4lbs   Reps 10-12     Recumbant Bike   Level 3.5   Minutes 10   METs 2.7     NuStep   Level 4   Minutes 10   METs 3     Arm Ergometer   Level 3   Watts 15   Minutes 10   METs 2.3     Home Exercise Plan   Plans to continue exercise at Home   Frequency Add 2 additional days to program exercise sessions.      Functional Capacity:     6 Minute Walk    Row Name 04/07/16 1018 04/07/16 1118 08/01/16 1224  6 Minute Walk   Phase Initial Initial Discharge   Distance  - 856 feet 800 feet   Distance % Change  -  - 6.54 %   Walk Time 6 minutes 6 minutes 6 minutes   # of Rest Breaks 0 0 0   MPH  - 1.6 1.5   METS  - 0.5 0.55   RPE  - 11 13   VO2 Peak  - 1.8 1.9   Symptoms  - No Yes (comment)   Comments  -  - knees gave out   Resting HR 60 bpm  - 67 bpm   Resting BP 126/60  - 102/62   Max Ex. HR 71 bpm  - 89 bpm   Max Ex. BP 126/62  - 132/70   2 Minute Post BP 122/68  - 128/70      Psychological, QOL, Others - Outcomes: PHQ 2/9: Depression screen PHQ 2/9 07/06/2016  Decreased Interest 0  Down, Depressed, Hopeless 0  PHQ - 2 Score 0    Quality of Life:     Quality of Life - 07/06/16 0813      Quality of Life Scores   Health/Function Post 26.03 %   Socioeconomic Post 29.14 %   Psych/Spiritual Post 30 %   Family Post 28.8 %   GLOBAL Post 27.9 %      Personal Goals: Goals established at  orientation with interventions provided to work toward goal.     Personal Goals and Risk Factors at Admission - 04/07/16 0827      Core Components/Risk Factors/Patient Goals on Admission    Weight Management Yes   Intervention Weight Management: Develop a combined nutrition and exercise program designed to reach desired caloric intake, while maintaining appropriate intake of nutrient and fiber, sodium and fats, and appropriate energy expenditure required for the weight goal.;Weight Management: Provide education and appropriate resources to help participant work on and attain dietary goals.;Obesity: Provide education and appropriate resources to help participant work on and attain dietary goals.;Weight Management/Obesity: Establish reasonable short term and long term weight goals.   Expected Outcomes Short Term: Continue to assess and modify interventions until short term weight is achieved;Long Term: Adherence to nutrition and physical activity/exercise program aimed toward attainment of established weight goal;Weight Maintenance: Understanding of the daily nutrition guidelines, which includes 25-35% calories from fat, 7% or less cal from saturated fats, less than 200mg  cholesterol, less than 1.5gm of sodium, & 5 or more servings of fruits and vegetables daily;Weight Loss: Understanding of general recommendations for a balanced deficit meal plan, which promotes 1-2 lb weight loss per week and includes a negative energy balance of [phone] kcal/d;Understanding recommendations for meals to include 15-35% energy as protein, 25-35% energy from fat, 35-60% energy from carbohydrates, less than 200mg  of dietary cholesterol, 20-35 gm of total fiber daily;Understanding of distribution of calorie intake throughout the day with the consumption of 4-5 meals/snacks   Sedentary Yes   Intervention Provide advice, education, support and counseling about physical activity/exercise needs.;Develop an individualized exercise  prescription for aerobic and resistive training based on initial evaluation findings, risk stratification, comorbidities and participant's personal goals.   Expected Outcomes Achievement of increased cardiorespiratory fitness and enhanced flexibility, muscular endurance and strength shown through measurements of functional capacity and personal statement of participant.   Increase Strength and Stamina Yes   Intervention Provide advice, education, support and counseling about physical activity/exercise needs.;Develop an individualized exercise prescription for aerobic and resistive training based on initial evaluation findings,  risk stratification, comorbidities and participant's personal goals.   Expected Outcomes Achievement of increased cardiorespiratory fitness and enhanced flexibility, muscular endurance and strength shown through measurements of functional capacity and personal statement of participant.   Improve shortness of breath with ADL's Yes   Intervention Provide education, individualized exercise plan and daily activity instruction to help decrease symptoms of SOB with activities of daily living.   Expected Outcomes Short Term: Achieves a reduction of symptoms when performing activities of daily living.   Diabetes Yes   Intervention Provide education about proper nutrition, including hydration, and aerobic/resistive exercise prescription along with prescribed medications to achieve blood glucose in normal ranges: Fasting glucose 65-99 mg/dL;Provide education about signs/symptoms and action to take for hypo/hyperglycemia.   Expected Outcomes Short Term: Participant verbalizes understanding of the signs/symptoms and immediate care of hyper/hypoglycemia, proper foot care and importance of medication, aerobic/resistive exercise and nutrition plan for blood glucose control.   Personal Goal Other Yes   Personal Goal short: get back to yardwork, gardening and planting  long: be able to walk longer  distances with less SOB   Intervention provide exercising programming to increase endurance and stamina to assist with gardening and walking longer distances   Expected Outcomes be able to garden and plant and walk further       Personal Goals Discharge:     Goals and Risk Factor Review    Row Name 05/09/16 1731 05/09/16 1732 06/10/16 1619         Core Components/Risk Factors/Patient Goals Review   Personal Goals Review Other;Improve shortness of breath with ADL's  -  -     Review pt is back to doing yard work without any complications or issues  - Pt is walking and doing household chores, weeding and planting without difficulty or SOB     Expected Outcomes pt will be able to walk longer and do yardwork on her own pt will be able to walk longer and do yardwork on his own pt will continue to take care of garden on his own        Nutrition & Weight - Outcomes:     Pre Biometrics - 04/07/16 1116      Pre Biometrics   % Body Fat 33.5 %         Post Biometrics - 08/01/16 1222       Post  Biometrics   Weight 210 lb 8.6 oz (95.5 kg)   Waist Circumference 44.5 inches   Hip Circumference 42.75 inches   Waist to Hip Ratio 1.04 %   Triceps Skinfold 22 mm   % Body Fat 34.2 %   Grip Strength 43.5 kg   Flexibility 8.25 in   Single Leg Stand 3.42 seconds      Nutrition:     Nutrition Therapy & Goals - 04/11/16 1520      Nutrition Therapy   Diet Diabetic, Therapeutic Lifestyle Changes     Personal Nutrition Goals   Personal Goal #1 0.5-2 lb wt loss per week to a wt loss goal of 6-10 lb at graduation from Cardiac Rehab     Intervention Plan   Intervention Prescribe, educate and counsel regarding individualized specific dietary modifications aiming towards targeted core components such as weight, hypertension, lipid management, diabetes, heart failure and other comorbidities.   Expected Outcomes Short Term Goal: Understand basic principles of dietary content, such as  calories, fat, sodium, cholesterol and nutrients.;Long Term Goal: Adherence to prescribed nutrition plan.  Nutrition Discharge:     Nutrition Assessments - 07/06/16 1408      MEDFICTS Scores   Pre Score 51   Post Score 33   Score Difference -18      Education Questionnaire Score:     Knowledge Questionnaire Score - 07/06/16 0813      Knowledge Questionnaire Score   Post Score 20/24      Goals reviewed with patient; copy given to patient.

## 2016-07-08 ENCOUNTER — Encounter (HOSPITAL_COMMUNITY): Payer: Medicare Other

## 2016-07-11 ENCOUNTER — Encounter (HOSPITAL_COMMUNITY): Payer: Medicare Other

## 2016-07-13 ENCOUNTER — Encounter (HOSPITAL_COMMUNITY): Payer: Medicare Other

## 2016-07-15 ENCOUNTER — Encounter (HOSPITAL_COMMUNITY): Payer: Medicare Other

## 2016-08-01 NOTE — Addendum Note (Signed)
Encounter addended by: Jermya Dowding D Ethanael Veith on: 08/01/2016 12:44 PM<BR>    Actions taken: Flowsheet data copied forward, Flowsheet accepted, Visit Navigator Flowsheet section accepted

## 2016-08-01 NOTE — Addendum Note (Signed)
Encounter addended by: Robyne PeersJoann H Rion, RN on: 08/01/2016  3:18 PM<BR>    Actions taken: Sign clinical note

## 2016-08-03 DIAGNOSIS — H401131 Primary open-angle glaucoma, bilateral, mild stage: Secondary | ICD-10-CM | POA: Diagnosis not present

## 2016-08-05 DIAGNOSIS — I10 Essential (primary) hypertension: Secondary | ICD-10-CM | POA: Diagnosis not present

## 2016-08-05 DIAGNOSIS — E1129 Type 2 diabetes mellitus with other diabetic kidney complication: Secondary | ICD-10-CM | POA: Diagnosis not present

## 2016-08-12 DIAGNOSIS — I209 Angina pectoris, unspecified: Secondary | ICD-10-CM | POA: Diagnosis not present

## 2016-08-12 DIAGNOSIS — I480 Paroxysmal atrial fibrillation: Secondary | ICD-10-CM | POA: Diagnosis not present

## 2016-08-12 DIAGNOSIS — Z6830 Body mass index (BMI) 30.0-30.9, adult: Secondary | ICD-10-CM | POA: Diagnosis not present

## 2016-08-12 DIAGNOSIS — N183 Chronic kidney disease, stage 3 (moderate): Secondary | ICD-10-CM | POA: Diagnosis not present

## 2016-08-12 DIAGNOSIS — I252 Old myocardial infarction: Secondary | ICD-10-CM | POA: Diagnosis not present

## 2016-08-12 DIAGNOSIS — Z1389 Encounter for screening for other disorder: Secondary | ICD-10-CM | POA: Diagnosis not present

## 2016-08-12 DIAGNOSIS — E1129 Type 2 diabetes mellitus with other diabetic kidney complication: Secondary | ICD-10-CM | POA: Diagnosis not present

## 2016-08-12 DIAGNOSIS — Z23 Encounter for immunization: Secondary | ICD-10-CM | POA: Diagnosis not present

## 2016-08-12 DIAGNOSIS — I2510 Atherosclerotic heart disease of native coronary artery without angina pectoris: Secondary | ICD-10-CM | POA: Diagnosis not present

## 2016-08-12 DIAGNOSIS — N200 Calculus of kidney: Secondary | ICD-10-CM | POA: Diagnosis not present

## 2016-08-12 DIAGNOSIS — Z0000 Encounter for general adult medical examination without abnormal findings: Secondary | ICD-10-CM | POA: Diagnosis not present

## 2016-08-12 DIAGNOSIS — E668 Other obesity: Secondary | ICD-10-CM | POA: Diagnosis not present

## 2016-08-17 ENCOUNTER — Other Ambulatory Visit: Payer: Self-pay | Admitting: Cardiovascular Disease

## 2016-08-17 DIAGNOSIS — I4819 Other persistent atrial fibrillation: Secondary | ICD-10-CM

## 2016-08-17 MED ORDER — POTASSIUM CHLORIDE ER 10 MEQ PO TBCR: 10.0000 meq | EXTENDED_RELEASE_TABLET | Freq: Every day | ORAL | 2 refills | Status: DC

## 2016-08-17 MED ORDER — FUROSEMIDE 40 MG PO TABS: 40.0000 mg | ORAL_TABLET | Freq: Every day | ORAL | 2 refills | Status: DC

## 2016-08-17 NOTE — Addendum Note (Signed)
Addended by: Demetrios LollBARNARD, CATHY C on: 08/17/2016 01:51 PM   Modules accepted: Orders

## 2016-11-09 DIAGNOSIS — E119 Type 2 diabetes mellitus without complications: Secondary | ICD-10-CM | POA: Diagnosis not present

## 2016-11-18 DIAGNOSIS — I509 Heart failure, unspecified: Secondary | ICD-10-CM | POA: Diagnosis not present

## 2016-11-18 DIAGNOSIS — R609 Edema, unspecified: Secondary | ICD-10-CM | POA: Diagnosis not present

## 2016-11-18 DIAGNOSIS — Z6834 Body mass index (BMI) 34.0-34.9, adult: Secondary | ICD-10-CM | POA: Diagnosis not present

## 2016-11-21 DIAGNOSIS — E1129 Type 2 diabetes mellitus with other diabetic kidney complication: Secondary | ICD-10-CM | POA: Diagnosis not present

## 2016-11-21 DIAGNOSIS — Z6833 Body mass index (BMI) 33.0-33.9, adult: Secondary | ICD-10-CM | POA: Diagnosis not present

## 2016-11-21 DIAGNOSIS — I509 Heart failure, unspecified: Secondary | ICD-10-CM | POA: Diagnosis not present

## 2016-11-21 DIAGNOSIS — R0609 Other forms of dyspnea: Secondary | ICD-10-CM | POA: Diagnosis not present

## 2016-11-21 DIAGNOSIS — R600 Localized edema: Secondary | ICD-10-CM | POA: Diagnosis not present

## 2016-11-21 DIAGNOSIS — I480 Paroxysmal atrial fibrillation: Secondary | ICD-10-CM | POA: Diagnosis not present

## 2016-11-21 DIAGNOSIS — N183 Chronic kidney disease, stage 3 (moderate): Secondary | ICD-10-CM | POA: Diagnosis not present

## 2016-11-21 DIAGNOSIS — I10 Essential (primary) hypertension: Secondary | ICD-10-CM | POA: Diagnosis not present

## 2016-12-01 ENCOUNTER — Ambulatory Visit (HOSPITAL_COMMUNITY)
Admission: RE | Admit: 2016-12-01 | Discharge: 2016-12-01 | Disposition: A | Discharge status: Home / Self Care | Payer: Medicare Other | Source: Ambulatory Visit | Attending: Cardiovascular Disease | Admitting: Cardiovascular Disease

## 2016-12-01 ENCOUNTER — Ambulatory Visit (INDEPENDENT_AMBULATORY_CARE_PROVIDER_SITE_OTHER): Payer: Medicare Other | Admitting: Cardiovascular Disease

## 2016-12-01 ENCOUNTER — Telehealth: Payer: Self-pay | Admitting: Cardiovascular Disease

## 2016-12-01 ENCOUNTER — Encounter: Payer: Self-pay | Admitting: Cardiovascular Disease

## 2016-12-01 VITALS — BP 116/64 | HR 70 | Ht 70.0 in | Wt 222.8 lb

## 2016-12-01 DIAGNOSIS — E119 Type 2 diabetes mellitus without complications: Secondary | ICD-10-CM | POA: Diagnosis not present

## 2016-12-01 DIAGNOSIS — I5033 Acute on chronic diastolic (congestive) heart failure: Secondary | ICD-10-CM | POA: Insufficient documentation

## 2016-12-01 DIAGNOSIS — I10 Essential (primary) hypertension: Secondary | ICD-10-CM | POA: Insufficient documentation

## 2016-12-01 DIAGNOSIS — I2510 Atherosclerotic heart disease of native coronary artery without angina pectoris: Secondary | ICD-10-CM | POA: Insufficient documentation

## 2016-12-01 DIAGNOSIS — E785 Hyperlipidemia, unspecified: Secondary | ICD-10-CM | POA: Insufficient documentation

## 2016-12-01 DIAGNOSIS — R600 Localized edema: Secondary | ICD-10-CM | POA: Insufficient documentation

## 2016-12-01 DIAGNOSIS — I5043 Acute on chronic combined systolic (congestive) and diastolic (congestive) heart failure: Secondary | ICD-10-CM | POA: Diagnosis not present

## 2016-12-01 DIAGNOSIS — R0789 Other chest pain: Secondary | ICD-10-CM | POA: Diagnosis not present

## 2016-12-01 DIAGNOSIS — I350 Nonrheumatic aortic (valve) stenosis: Secondary | ICD-10-CM

## 2016-12-01 DIAGNOSIS — Z87891 Personal history of nicotine dependence: Secondary | ICD-10-CM | POA: Insufficient documentation

## 2016-12-01 MED ORDER — APIXABAN 5 MG PO TABS: 5.0000 mg | ORAL_TABLET | Freq: Two times a day (BID) | ORAL | 3 refills | Status: DC

## 2016-12-01 NOTE — Patient Instructions (Addendum)
Medication Instructions:  Your physician recommends that you continue on your current medications as directed. Please refer to the Current Medication list given to you today.   Labwork: None Ordered   Testing/Procedures: Your physician has requested that you have a lower extremity venous duplex. This test is an ultrasound of the veins in the legs or arms. It looks at venous blood flow that carries blood from the heart to the legs or arms. Allow one hour for a Lower Venous exam. Allow thirty minutes for an Upper Venous exam. There are no restrictions or special instructions.   Follow-Up: Your physician wants you to follow-up in: 6 months with Dr. Elease HashimotoNahser. You will receive a reminder letter in the mail two months in advance. If you don't receive a letter, please call our office to schedule the follow-up appointment.   If you need a refill on your cardiac medications before your next appointment, please call your pharmacy.   Thank you for choosing [facility]! Eligha BridegroomMichelle Swinyer, RN [phone]     For your  leg edema you  should do  the following 1. Leg elevation - I recommend the Lounge Dr. Leg rest.  See below for details  2. Salt restriction  -  Use potassium chloride instead of regular salt as a salt substitute. 3. Walk regularly 4. Compression hose - [facility] 5. Weight loss     Go to [email or web address]

## 2016-12-01 NOTE — Telephone Encounter (Signed)
Patient was seen by Dr. Elease HashimotoNahser today Dec 01, 2016 [facility] - r/o DVT Per vascular tech, no DVT noted After vascular study, when patient got up to leave, he became short of breath and just "give out" Patient was evaluated by RN   -- initial BP 112/68  HR 60  O2 97% Spoke with DOD Dr. Tresa EndoKelly regarding patient concerns   -- MD reviewed OV  -- MD aware DVT negative  -- MD recommended orthostatic VS be checked Lying:      BP = 111/69  HR = 71  O2 = 97% Sitting:     BP = 117/63  HR = 63  O2 = 95% Standing: BP = 115/67  HR = 64  O2 = 97% When patient was escorted by RN from check out area to exam room (maybe 50 feet) patient became quite dyspneic which he states he was not feeling earlier today and is worse than normal for him. He does report he had to be escorted in a wheelchair while visiting his wife in the hospital. Per MD note, patient has not been using lasix as prescribed due to frequent travels visiting wife. Patient is anticoagulated.   VS and medical history, concerns at present reviewed - DOD advised NO med changes, advised patient to take lasix as directed. Advised if no improvement in symptoms by tomorrow to contact Dr. Harvie BridgeNahser's office/possible ED eval.   Patient voiced understanding of MD recommendations. Patient escorted to his car via wheelchair.   Message routed to Dr. Elease HashimotoNahser

## 2016-12-01 NOTE — Progress Notes (Signed)
Cardiology Office Note   Date:  12/01/2016   ID:  [name] REIERSON, DOB [birth year], MRN [id number]  PCP:  Gaspar Garbe, MD  Cardiologist:   Kristeen Miss, MD   Chief Complaint  Patient presents with  . Atrial Fibrillation     Problem List 1. Exertional chest pain  2. DM 2 3. Hyperlipidemia 4. Essential HTN 5. Atrial fib   History of Present Illness: [name] [name] is a 83 y.o. male who presents for evaluation of exertional CP He's been very healthy and very active for many years. 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 He still keeps up with his yard work. This past weekend he was spreading e out on his front and back are. He spent about 160 pounds of grass seed. He noted that he started having some episodes of exertional chest pain. He stopped to rest and the pain resolved. He then resumed his yard work and the pain returned again. Deep left sided chest pain . , associated with dyspnea and leg weakness .  He was able to finish the artery without too much difficulty. Since that time, he's been having some exertional  leg fatigue, and generalized fatigue. He has not had any further chest pain   no dietary restrictions.   Feb. 16, 2017:  No cardiac issues.  Has a cough from burning trash last week.     February 24, 2016  Myoview showed a previous Iff. MI.   EF = 60%  Having more DO E.   Unable to walk very far . He has DOE walking to the mail box but can dig in the garden without too much trouble.  His medical doctor is scheduling him for rehab.   He was in NSR at his previous visits.   Since that time , several ECGs have shown A-fib  Has been on Eliquis   May 18, 2016:  Breathing is good.  No CP Has some leg weakness,   Can walk a mile if he takes his time   Feb. 1st, 2018: [name] is seen back for follow-up visit. He has a history of paroxysmal atrial fibrillation, hyperlipidemia, and essential hypertension. Has had  leg swelling  And swelling in the groin  His primary MD doubled the lasix but [name] [name] has not been able to take the extra lasix because he goes to [place] to visit his wife every other day and does not take Lasix on those days. He also has not been elevating his legs.  Past Medical History:  Diagnosis Date  . BPH with elevated PSA   . Chest pain   . Chronic kidney disease, stage III (moderate)   . DM (diabetes mellitus), type 2 with renal complications (HCC)   . ED (erectile dysfunction)   . Glaucoma   . Hyperlipidemia   . Hypertension   . Microalbuminuria   . Microalbuminuria 2013  . Obesity   . Osteoarthritis   . Prostatic hypertrophy, benign, with obstruction   . Renal calculus   . Sick sinus syndrome [facility])     Past Surgical History:  Procedure Laterality Date  . KNEE SURGERY    . REPLACEMENT TOTAL KNEE  02/26/01     Current Outpatient Prescriptions  Medication Sig Dispense Refill  . alfuzosin (UROXATRAL) 10 MG 24 hr tablet Take 10 mg by mouth daily with breakfast.    . allopurinol (ZYLOPRIM) 100 MG tablet Take 100 mg by  mouth daily.    . brinzolamide (AZOPT) 1 % ophthalmic suspension Place 1 drop into both eyes 2 (two) times daily.    Marland Kitchen ELIQUIS 5 MG TABS tablet TAKE 1 TABLET BY MOUTH TWICE A DAY 60 tablet 0  . finasteride (PROSCAR) 5 MG tablet Take 5 mg by mouth daily.  3  . furosemide (LASIX) 40 MG tablet Take 1 tablet (40 mg total) by mouth daily. 90 tablet 2  . glimepiride (AMARYL) 4 MG tablet Take 4 mg by mouth daily.    . isosorbide mononitrate (IMDUR) 60 MG 24 hr tablet Take 60 mg by mouth daily.    Marland Kitchen losartan (COZAAR) 100 MG tablet Take 100 mg by mouth daily.    . metFORMIN (GLUMETZA) 1000 MG (MOD) 24 hr tablet Take 1,000 mg by mouth daily with breakfast.    . Multiple Vitamins-Minerals (CENTRUM PO) Take 1 tablet by mouth every other day.     Marland Kitchen MYRBETRIQ 50 MG TB24 tablet Take 50 mg by mouth daily.    . nitroGLYCERIN (NITROSTAT) 0.4 MG SL tablet Place 1  tablet (0.4 mg total) under the tongue every 5 (five) minutes as needed for chest pain. 25 tablet 6  . potassium chloride (K-DUR) 10 MEQ tablet Take 1 tablet (10 mEq total) by mouth daily. 90 tablet 2  . RAPAFLO 8 MG CAPS capsule Take 8 mg by mouth daily.    . saxagliptin HCl (ONGLYZA) 5 MG TABS tablet Take 5 mg by mouth daily.    . simvastatin (ZOCOR) 80 MG tablet Take 40 mg by mouth daily at 6 PM.     . travoprost, benzalkonium, (TRAVATAN) 0.004 % ophthalmic solution Place 1 drop into both eyes at bedtime.     No current facility-administered medications for this visit.     Allergies:   Patient has no known allergies.    Social History:  The patient  reports that he has quit smoking. His smoking use included Cigarettes. He has never used smokeless tobacco. He reports that he does not drink alcohol or use drugs.   Family History:  The patient's family history includes Cancer in his paternal grandfather and sister; Cancer - Colon in his sister; Healthy in his child and child; Other in his child; Stroke in his father.    ROS:  Please see the history of present illness.    Review of Systems: Constitutional:  denies fever, chills, diaphoresis, appetite change and fatigue.  HEENT: denies photophobia, eye pain, redness, hearing loss, ear pain, congestion, sore throat, rhinorrhea, sneezing, neck pain, neck stiffness and tinnitus.  Respiratory: denies SOB, DOE, cough, chest tightness, and wheezing.  Cardiovascular: denies chest pain, palpitations and leg swelling.  Gastrointestinal: denies nausea, vomiting, abdominal pain, diarrhea, constipation, blood in stool.  Genitourinary: denies dysuria, urgency, frequency, hematuria, flank pain and difficulty urinating.  Musculoskeletal: denies  myalgias, back pain, joint swelling, arthralgias and gait problem.   Skin: denies pallor, rash and wound.  Neurological: denies dizziness, seizures, syncope, weakness, light-headedness, numbness and headaches.     Hematological: denies adenopathy, easy bruising, personal or family bleeding history.  Psychiatric/ Behavioral: denies suicidal ideation, mood changes, confusion, nervousness, sleep disturbance and agitation.       All other systems are reviewed and negative.    PHYSICAL EXAM: VS:  BP 116/64 (BP Location: Right Arm)   Pulse 70   Ht 5\' 10"  (1.778 m)   Wt 222 lb 12.8 oz (101.1 kg)   BMI 31.97 kg/m  , BMI Body mass  index is 31.97 kg/m. GEN: Well nourished, well developed, in no acute distress  HEENT: normal  Neck: no JVD, carotid bruits, or masses Cardiac:  Irreg. Irreg    Soft systolic murmur,  No rubs, or gallops,no edema  Respiratory:   Has wheezes - particularly on the right lower lung field  GI: soft, nontender, nondistended, + BS MS: no deformity or atrophy  Leg pulses are 2+   Skin: warm and dry, no rash Neuro:  Strength and sensation are intact Psych: normal   EKG:  EKG is not ordered today.  Recent Labs: 12/10/2015: ALT 24 05/18/2016: BUN 24; Creat 1.36; Hemoglobin 13.4; Platelets 112; Potassium 4.3; Sodium 141    Lipid Panel No results found for: CHOL, TRIG, HDL, CHOLHDL, VLDL, LDLCALC, LDLDIRECT    Wt Readings from Last 3 Encounters:  12/01/16 222 lb 12.8 oz (101.1 kg)  08/01/16 210 lb 8.6 oz (95.5 kg)  05/18/16 215 lb 6.4 oz (97.7 kg)      Other studies Reviewed: Additional studies/ records that were reviewed today include: . Review of the above records demonstrates:    ASSESSMENT AND PLAN:  1.  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 Walks and works in his garden without any problems   2. Exertional leg fatigue:  Still has weakness.  Arterial duplex was normal   3. Atrial fib - on eliquis .  CHADS2VASC =  5   ( age [age], DM, HTN, vasc )  Echo shows normal LV systolic function  Mild AS  Will continue with rate control and anticoagulation  He is basically asymptomatic.    Will hold off on cardioversion at this point    4. Chronic diastolic  CHF   breathing is good.  He has had some progressive leg swelling left greater than right. We'll get a venous duplex scan to make sure that he doesn't have a DVT. His Lasix has been doubled. He has not been taking it correctly because he travels a much. I've recommended that he try to increase his Lasix is much as possible through the week. I have also recommended leg elevation and have given him instructions on getting a Lounge Dr. Leg rest.     I will see him in 6 months    Current medicines are reviewed at length with the patient today.  The patient does not have concerns regarding medicines.  The following changes have been made:  no change  Labs/ tests ordered today include:  No orders of the defined types were placed in this encounter.    Disposition:   FU with 6 months      Kristeen MissPhilip Senaya Dicenso, MD  12/01/2016 10:07 AM    [facility] [address], [place], [place]  [address] Phone: [phone]; Fax: [phone]

## 2016-12-07 DIAGNOSIS — N401 Enlarged prostate with lower urinary tract symptoms: Secondary | ICD-10-CM | POA: Diagnosis not present

## 2016-12-07 DIAGNOSIS — R350 Frequency of micturition: Secondary | ICD-10-CM | POA: Diagnosis not present

## 2016-12-07 DIAGNOSIS — R3915 Urgency of urination: Secondary | ICD-10-CM | POA: Diagnosis not present

## 2016-12-09 ENCOUNTER — Other Ambulatory Visit: Payer: Self-pay | Admitting: Cardiovascular Disease

## 2016-12-23 ENCOUNTER — Telehealth: Payer: Self-pay | Admitting: Cardiovascular Disease

## 2016-12-23 NOTE — Telephone Encounter (Signed)
Left message for Mr. [name] [name] to call back. I do not see this person listed on patient's DPR

## 2016-12-23 NOTE — Telephone Encounter (Signed)
Follow Up   Pt called and said he has the issues resolved and will call back if he has further questions or needs to speak to you. Pt wanted me to let you know

## 2016-12-23 NOTE — Telephone Encounter (Signed)
New Message    Pt wants to know who in our area does the Transradial Cardiac Approach? It is where a cardiac device is implanted in the arm instead of in the chest to regulated the heart?

## 2017-01-04 DIAGNOSIS — I25119 Atherosclerotic heart disease of native coronary artery with unspecified angina pectoris: Secondary | ICD-10-CM | POA: Diagnosis not present

## 2017-01-04 DIAGNOSIS — R600 Localized edema: Secondary | ICD-10-CM | POA: Diagnosis not present

## 2017-01-04 DIAGNOSIS — R0602 Shortness of breath: Secondary | ICD-10-CM | POA: Diagnosis not present

## 2017-01-04 DIAGNOSIS — I482 Chronic atrial fibrillation: Secondary | ICD-10-CM | POA: Diagnosis not present

## 2017-01-05 ENCOUNTER — Other Ambulatory Visit: Payer: Self-pay | Admitting: Cardiology

## 2017-01-05 DIAGNOSIS — I872 Venous insufficiency (chronic) (peripheral): Secondary | ICD-10-CM

## 2017-01-12 DIAGNOSIS — R600 Localized edema: Secondary | ICD-10-CM | POA: Diagnosis not present

## 2017-01-12 DIAGNOSIS — R351 Nocturia: Secondary | ICD-10-CM | POA: Diagnosis not present

## 2017-01-12 DIAGNOSIS — R0602 Shortness of breath: Secondary | ICD-10-CM | POA: Diagnosis not present

## 2017-01-13 DIAGNOSIS — I2510 Atherosclerotic heart disease of native coronary artery without angina pectoris: Secondary | ICD-10-CM | POA: Diagnosis not present

## 2017-01-13 DIAGNOSIS — I4891 Unspecified atrial fibrillation: Secondary | ICD-10-CM | POA: Diagnosis not present

## 2017-01-13 DIAGNOSIS — R0602 Shortness of breath: Secondary | ICD-10-CM | POA: Diagnosis not present

## 2017-01-17 ENCOUNTER — Ambulatory Visit
Admission: RE | Admit: 2017-01-17 | Discharge: 2017-01-17 | Disposition: A | Discharge status: Home / Self Care | Payer: Medicare Other | Source: Ambulatory Visit | Attending: Cardiology | Admitting: Cardiology

## 2017-01-17 DIAGNOSIS — I872 Venous insufficiency (chronic) (peripheral): Secondary | ICD-10-CM

## 2017-01-17 NOTE — Consult Note (Signed)
Chief Complaint: Patient was seen in consultation today for  Chief Complaint  Patient presents with  . Venous Insufficiency    E & M of Venous Insufficiency   at the request of Ganji,Jay  Referring Physician(s): Ganji,Jay  History of Present Illness: [name] [name] is a 83 y.o. male who complains of a six-month history of bilateral ankle swelling left greater than right. He injured his ankles on a recliner 6 months ago and has had swelling ever since. He denies any skin breakdown or varicosities. The swelling does interfere with his walking and improves after waking up in the morning. He has not had any previous history of venous insufficiency.  Past Medical History:  Diagnosis Date  . BPH with elevated PSA   . Chest pain   . Chronic kidney disease, stage III (moderate)   . DM (diabetes mellitus), type 2 with renal complications (HCC)   . ED (erectile dysfunction)   . Glaucoma   . Hyperlipidemia   . Hypertension   . Microalbuminuria   . Microalbuminuria 2013  . Obesity   . Osteoarthritis   . Prostatic hypertrophy, benign, with obstruction   . Renal calculus   . Sick sinus syndrome [facility](HCC)     Past Surgical History:  Procedure Laterality Date  . KNEE SURGERY    . REPLACEMENT TOTAL KNEE  02/26/01    Allergies: Patient has no known allergies.  Medications: Prior to Admission medications   Medication Sig Start Date End Date Taking? Authorizing Provider  alfuzosin (UROXATRAL) 10 MG 24 hr tablet Take 10 mg by mouth daily with breakfast.    Historical Provider, MD  allopurinol (ZYLOPRIM) 100 MG tablet Take 100 mg by mouth daily.    Historical Provider, MD  apixaban (ELIQUIS) 5 MG TABS tablet Take 1 tablet (5 mg total) by mouth 2 (two) times daily. 12/01/16   Vesta MixerPhilip J Nahser, MD  brinzolamide (AZOPT) 1 % ophthalmic suspension Place 1 drop into both eyes 2 (two) times daily.    Historical Provider, MD  finasteride (PROSCAR) 5 MG tablet Take 5 mg by mouth daily.  08/12/15   Historical Provider, MD  furosemide (LASIX) 40 MG tablet Take 1 tablet (40 mg total) by mouth daily. 08/17/16   Vesta MixerPhilip J Nahser, MD  glimepiride (AMARYL) 4 MG tablet Take 4 mg by mouth daily. 02/19/16   Historical Provider, MD  isosorbide mononitrate (IMDUR) 60 MG 24 hr tablet Take 60 mg by mouth daily. 02/19/16   Historical Provider, MD  losartan (COZAAR) 100 MG tablet Take 100 mg by mouth daily.    Historical Provider, MD  metFORMIN (GLUMETZA) 1000 MG (MOD) 24 hr tablet Take 1,000 mg by mouth daily with breakfast.    Historical Provider, MD  Multiple Vitamins-Minerals (CENTRUM PO) Take 1 tablet by mouth every other day.     Historical Provider, MD  MYRBETRIQ 50 MG TB24 tablet Take 50 mg by mouth daily. 11/27/15   Historical Provider, MD  nitroGLYCERIN (NITROSTAT) 0.4 MG SL tablet Place 1 tablet (0.4 mg total) under the tongue every 5 (five) minutes as needed for chest pain. 09/17/15   Vesta MixerPhilip J Nahser, MD  potassium chloride (K-DUR) 10 MEQ tablet Take 1 tablet (10 mEq total) by mouth daily. 08/17/16   Vesta MixerPhilip J Nahser, MD  RAPAFLO 8 MG CAPS capsule Take 8 mg by mouth daily. 09/15/15   Historical Provider, MD  saxagliptin HCl (ONGLYZA) 5 MG TABS tablet Take 5 mg by mouth daily.    Historical Provider,  MD  simvastatin (ZOCOR) 80 MG tablet Take 40 mg by mouth daily at 6 PM.     Historical Provider, MD  travoprost, benzalkonium, (TRAVATAN) 0.004 % ophthalmic solution Place 1 drop into both eyes at bedtime.    Historical Provider, MD     Family History  Problem Relation Age of Onset  . Cancer Paternal Grandfather     STOMACH  . Cancer Sister     MALE CANCER  . Cancer - Colon Sister   . Healthy Child   . Healthy Child   . Other Child     BRAIN DAMAGE AT BIRTH  . Stroke Father     Social History   Social History  . Marital status: Married    Spouse name: N/A  . Number of children: N/A  . Years of education: N/A   Social History Main Topics  . Smoking status: Former Smoker     Types: Cigarettes  . Smokeless tobacco: Never Used  . Alcohol use No  . Drug use: No  . Sexual activity: Not on file   Other Topics Concern  . Not on file   Social History Narrative  . No narrative on file      Review of Systems: A 12 point ROS discussed and pertinent positives are indicated in the HPI above.  All other systems are negative.  Review of Systems  Vital Signs: BP 107/64 (BP Location: Left Arm, Patient Position: Sitting, Cuff Size: Normal)   Pulse 60   Temp 97.6 F (36.4 C) (Oral)   Resp 14   Ht 5\' 7"  (1.702 m)   Wt 200 lb (90.7 kg)   SpO2 99%   BMI 31.32 kg/m   Physical Exam  Musculoskeletal:  Examination of the right lower extremity demonstrates 1+ edema at the ankle. Pulses are intact distally.  Examination of the left lower extremity demonstrates 2+ edema at the ankle. There is no skin breakdown. Pulses are intact distally.     Imaging:  No results found.  Ultrasound was performed today. There is no evidence of DVT and no evidence of lower extremity venous insufficiency.  Labs:  CBC:  Recent Labs  05/18/16 0957  WBC 5.5  HGB 13.4  HCT 40.2  PLT 112*    COAGS: No results for input(s): INR, APTT in the last 8760 hours.  BMP:  Recent Labs  03/07/16 0843 05/18/16 0957  NA 139 141  K 3.9 4.3  CL 103 107  CO2 26 25  GLUCOSE 172* 245*  BUN 33* 24  CALCIUM 9.2 9.1  CREATININE 1.30* 1.36*    LIVER FUNCTION TESTS: No results for input(s): BILITOT, AST, ALT, ALKPHOS, PROT, ALBUMIN in the last 8760 hours.  TUMOR MARKERS: No results for input(s): AFPTM, CEA, CA199, CHROMGRNA in the last 8760 hours.  Assessment and Plan:  There is no evidence of venous insufficiency in the lower extremities. Ongoing edema may be related to the instigating injury and possibly cardiac issues.  Thank you for this interesting consult.  I greatly enjoyed meeting MOOSA BUECHE and look forward to participating in their care.  A copy of this  report was sent to the requesting provider on this date.  Electronically Signed: Aadvik Roker, ART A 01/17/2017, 3:02 PM   I spent a total of  40 Minutes   in face to face in clinical consultation, greater than 50% of which was counseling/coordinating care for lower extremity edema.

## 2017-01-24 DIAGNOSIS — I482 Chronic atrial fibrillation: Secondary | ICD-10-CM | POA: Diagnosis not present

## 2017-01-24 DIAGNOSIS — R0602 Shortness of breath: Secondary | ICD-10-CM | POA: Diagnosis not present

## 2017-01-24 DIAGNOSIS — I25119 Atherosclerotic heart disease of native coronary artery with unspecified angina pectoris: Secondary | ICD-10-CM | POA: Diagnosis not present

## 2017-01-24 DIAGNOSIS — I252 Old myocardial infarction: Secondary | ICD-10-CM | POA: Diagnosis not present

## 2017-02-22 DIAGNOSIS — N183 Chronic kidney disease, stage 3 (moderate): Secondary | ICD-10-CM | POA: Diagnosis not present

## 2017-02-22 DIAGNOSIS — Z6833 Body mass index (BMI) 33.0-33.9, adult: Secondary | ICD-10-CM | POA: Diagnosis not present

## 2017-02-22 DIAGNOSIS — E7800 Pure hypercholesterolemia, unspecified: Secondary | ICD-10-CM | POA: Diagnosis not present

## 2017-02-22 DIAGNOSIS — I480 Paroxysmal atrial fibrillation: Secondary | ICD-10-CM | POA: Diagnosis not present

## 2017-02-22 DIAGNOSIS — I2510 Atherosclerotic heart disease of native coronary artery without angina pectoris: Secondary | ICD-10-CM | POA: Diagnosis not present

## 2017-02-22 DIAGNOSIS — I10 Essential (primary) hypertension: Secondary | ICD-10-CM | POA: Diagnosis not present

## 2017-02-22 DIAGNOSIS — H4089 Other specified glaucoma: Secondary | ICD-10-CM | POA: Diagnosis not present

## 2017-02-22 DIAGNOSIS — I208 Other forms of angina pectoris: Secondary | ICD-10-CM | POA: Diagnosis not present

## 2017-02-22 DIAGNOSIS — E1129 Type 2 diabetes mellitus with other diabetic kidney complication: Secondary | ICD-10-CM | POA: Diagnosis not present

## 2017-02-22 DIAGNOSIS — M1990 Unspecified osteoarthritis, unspecified site: Secondary | ICD-10-CM | POA: Diagnosis not present

## 2017-02-22 DIAGNOSIS — I509 Heart failure, unspecified: Secondary | ICD-10-CM | POA: Diagnosis not present

## 2017-02-22 DIAGNOSIS — I252 Old myocardial infarction: Secondary | ICD-10-CM | POA: Diagnosis not present

## 2017-03-10 DIAGNOSIS — I5033 Acute on chronic diastolic (congestive) heart failure: Secondary | ICD-10-CM | POA: Diagnosis not present

## 2017-03-10 DIAGNOSIS — R001 Bradycardia, unspecified: Secondary | ICD-10-CM | POA: Diagnosis not present

## 2017-03-10 DIAGNOSIS — I482 Chronic atrial fibrillation: Secondary | ICD-10-CM | POA: Diagnosis not present

## 2017-03-10 DIAGNOSIS — R0602 Shortness of breath: Secondary | ICD-10-CM | POA: Diagnosis not present

## 2017-03-15 DIAGNOSIS — I5033 Acute on chronic diastolic (congestive) heart failure: Secondary | ICD-10-CM | POA: Diagnosis not present

## 2017-03-15 DIAGNOSIS — I509 Heart failure, unspecified: Secondary | ICD-10-CM | POA: Diagnosis not present

## 2017-03-15 DIAGNOSIS — N3946 Mixed incontinence: Secondary | ICD-10-CM | POA: Diagnosis not present

## 2017-03-15 DIAGNOSIS — Z6835 Body mass index (BMI) 35.0-35.9, adult: Secondary | ICD-10-CM | POA: Diagnosis not present

## 2017-03-15 DIAGNOSIS — R001 Bradycardia, unspecified: Secondary | ICD-10-CM | POA: Diagnosis not present

## 2017-03-16 ENCOUNTER — Encounter (HOSPITAL_COMMUNITY): Payer: Self-pay | Admitting: *Deleted

## 2017-03-16 ENCOUNTER — Inpatient Hospital Stay (HOSPITAL_COMMUNITY)
Admission: AD | Admit: 2017-03-16 | Discharge: 2017-03-20 | DRG: 291 | Disposition: A | Discharge status: Home / Self Care | Payer: Medicare Other | Source: Ambulatory Visit | Attending: Cardiology | Admitting: Cardiology

## 2017-03-16 DIAGNOSIS — N400 Enlarged prostate without lower urinary tract symptoms: Secondary | ICD-10-CM | POA: Diagnosis present

## 2017-03-16 DIAGNOSIS — Z87891 Personal history of nicotine dependence: Secondary | ICD-10-CM

## 2017-03-16 DIAGNOSIS — E11649 Type 2 diabetes mellitus with hypoglycemia without coma: Secondary | ICD-10-CM | POA: Diagnosis present

## 2017-03-16 DIAGNOSIS — I350 Nonrheumatic aortic (valve) stenosis: Secondary | ICD-10-CM | POA: Diagnosis present

## 2017-03-16 DIAGNOSIS — I5033 Acute on chronic diastolic (congestive) heart failure: Secondary | ICD-10-CM | POA: Diagnosis present

## 2017-03-16 DIAGNOSIS — H409 Unspecified glaucoma: Secondary | ICD-10-CM | POA: Diagnosis present

## 2017-03-16 DIAGNOSIS — E785 Hyperlipidemia, unspecified: Secondary | ICD-10-CM | POA: Diagnosis present

## 2017-03-16 DIAGNOSIS — I272 Pulmonary hypertension, unspecified: Secondary | ICD-10-CM | POA: Diagnosis present

## 2017-03-16 DIAGNOSIS — I482 Chronic atrial fibrillation: Secondary | ICD-10-CM | POA: Diagnosis present

## 2017-03-16 DIAGNOSIS — I252 Old myocardial infarction: Secondary | ICD-10-CM | POA: Diagnosis not present

## 2017-03-16 DIAGNOSIS — M1990 Unspecified osteoarthritis, unspecified site: Secondary | ICD-10-CM | POA: Diagnosis present

## 2017-03-16 DIAGNOSIS — N179 Acute kidney failure, unspecified: Secondary | ICD-10-CM | POA: Diagnosis present

## 2017-03-16 DIAGNOSIS — N184 Chronic kidney disease, stage 4 (severe): Secondary | ICD-10-CM | POA: Diagnosis present

## 2017-03-16 DIAGNOSIS — E1122 Type 2 diabetes mellitus with diabetic chronic kidney disease: Secondary | ICD-10-CM | POA: Diagnosis present

## 2017-03-16 DIAGNOSIS — Z6831 Body mass index (BMI) 31.0-31.9, adult: Secondary | ICD-10-CM

## 2017-03-16 DIAGNOSIS — Z96659 Presence of unspecified artificial knee joint: Secondary | ICD-10-CM | POA: Diagnosis present

## 2017-03-16 DIAGNOSIS — E669 Obesity, unspecified: Secondary | ICD-10-CM | POA: Diagnosis present

## 2017-03-16 DIAGNOSIS — I130 Hypertensive heart and chronic kidney disease with heart failure and stage 1 through stage 4 chronic kidney disease, or unspecified chronic kidney disease: Principal | ICD-10-CM | POA: Diagnosis present

## 2017-03-16 DIAGNOSIS — Z7984 Long term (current) use of oral hypoglycemic drugs: Secondary | ICD-10-CM | POA: Diagnosis not present

## 2017-03-16 DIAGNOSIS — I2510 Atherosclerotic heart disease of native coronary artery without angina pectoris: Secondary | ICD-10-CM | POA: Diagnosis present

## 2017-03-16 DIAGNOSIS — Z7901 Long term (current) use of anticoagulants: Secondary | ICD-10-CM

## 2017-03-16 DIAGNOSIS — I4819 Other persistent atrial fibrillation: Secondary | ICD-10-CM

## 2017-03-16 DIAGNOSIS — Z79899 Other long term (current) drug therapy: Secondary | ICD-10-CM | POA: Diagnosis not present

## 2017-03-16 DIAGNOSIS — R0609 Other forms of dyspnea: Secondary | ICD-10-CM | POA: Diagnosis not present

## 2017-03-16 DIAGNOSIS — R600 Localized edema: Secondary | ICD-10-CM | POA: Diagnosis not present

## 2017-03-16 LAB — GLUCOSE, CAPILLARY: Glucose-Capillary: 91 mg/dL (ref 65–99)

## 2017-03-16 LAB — BASIC METABOLIC PANEL
ANION GAP: 8 (ref 5–15)
BUN: 60 mg/dL — ABNORMAL HIGH (ref 6–20)
CHLORIDE: 108 mmol/L (ref 101–111)
CO2: 27 mmol/L (ref 22–32)
CREATININE: 2.43 mg/dL — AB (ref 0.61–1.24)
Calcium: 9 mg/dL (ref 8.9–10.3)
GFR calc non Af Amer: 22 mL/min — ABNORMAL LOW (ref >60)
GFR, EST AFRICAN AMERICAN: 26 mL/min — AB (ref >60)
Glucose, Bld: 93 mg/dL (ref 65–99)
POTASSIUM: 4.3 mmol/L (ref 3.5–5.1)
SODIUM: 143 mmol/L (ref 135–145)

## 2017-03-16 LAB — BRAIN NATRIURETIC PEPTIDE: B NATRIURETIC PEPTIDE 5: 622.7 pg/mL — AB (ref 0.0–100.0)

## 2017-03-16 MED ORDER — MIRABEGRON ER 50 MG PO TB24
50.0000 mg | ORAL_TABLET | Freq: Every day | ORAL | Status: DC
  Administered 2017-03-17 – 2017-03-20 (×4): 50 mg via ORAL
  Filled 2017-03-16 (×4): qty 1

## 2017-03-16 MED ORDER — METOPROLOL SUCCINATE ER 25 MG PO TB24
25.0000 mg | ORAL_TABLET | Freq: Every day | ORAL | Status: DC
  Administered 2017-03-18 – 2017-03-20 (×3): 25 mg via ORAL
  Filled 2017-03-16 (×4): qty 1

## 2017-03-16 MED ORDER — SODIUM CHLORIDE 0.9% FLUSH: 3.0000 mL | INTRAVENOUS | Status: DC | PRN

## 2017-03-16 MED ORDER — ATORVASTATIN CALCIUM 40 MG PO TABS
40.0000 mg | ORAL_TABLET | Freq: Every day | ORAL | Status: DC
  Administered 2017-03-16 – 2017-03-19 (×4): 40 mg via ORAL
  Filled 2017-03-16 (×4): qty 1

## 2017-03-16 MED ORDER — ACETAMINOPHEN 325 MG PO TABS: 650.0000 mg | ORAL_TABLET | ORAL | Status: DC | PRN

## 2017-03-16 MED ORDER — GLIMEPIRIDE 4 MG PO TABS
4.0000 mg | ORAL_TABLET | Freq: Every day | ORAL | Status: DC
  Administered 2017-03-17 – 2017-03-20 (×4): 4 mg via ORAL
  Filled 2017-03-16 (×4): qty 1

## 2017-03-16 MED ORDER — ISOSORBIDE MONONITRATE ER 60 MG PO TB24
60.0000 mg | ORAL_TABLET | Freq: Every day | ORAL | Status: DC
Start: 2017-03-17 — End: 2017-03-20
  Administered 2017-03-17 – 2017-03-20 (×4): 60 mg via ORAL
  Filled 2017-03-16 (×4): qty 1

## 2017-03-16 MED ORDER — SODIUM CHLORIDE 0.9% FLUSH
3.0000 mL | Freq: Two times a day (BID) | INTRAVENOUS | Status: DC
  Administered 2017-03-16 – 2017-03-19 (×7): 3 mL via INTRAVENOUS

## 2017-03-16 MED ORDER — BRINZOLAMIDE 1 % OP SUSP
1.0000 [drp] | Freq: Two times a day (BID) | OPHTHALMIC | Status: DC
  Administered 2017-03-16 – 2017-03-20 (×8): 1 [drp] via OPHTHALMIC
  Filled 2017-03-16: qty 10

## 2017-03-16 MED ORDER — INSULIN ASPART 100 UNIT/ML ~~LOC~~ SOLN
3.0000 [IU] | Freq: Three times a day (TID) | SUBCUTANEOUS | Status: DC
  Administered 2017-03-17 – 2017-03-20 (×8): 3 [IU] via SUBCUTANEOUS

## 2017-03-16 MED ORDER — POTASSIUM CHLORIDE ER 10 MEQ PO TBCR: 10.0000 meq | EXTENDED_RELEASE_TABLET | Freq: Every day | ORAL | Status: DC

## 2017-03-16 MED ORDER — APIXABAN 2.5 MG PO TABS
2.5000 mg | ORAL_TABLET | Freq: Two times a day (BID) | ORAL | Status: DC
  Administered 2017-03-16 – 2017-03-20 (×8): 2.5 mg via ORAL
  Filled 2017-03-16 (×8): qty 1

## 2017-03-16 MED ORDER — TAMSULOSIN HCL 0.4 MG PO CAPS
0.4000 mg | ORAL_CAPSULE | Freq: Every day | ORAL | Status: DC
  Administered 2017-03-17 – 2017-03-19 (×3): 0.4 mg via ORAL
  Filled 2017-03-16 (×3): qty 1

## 2017-03-16 MED ORDER — TRAVOPROST (BAK FREE) 0.004 % OP SOLN
1.0000 [drp] | Freq: Every day | OPHTHALMIC | Status: DC
  Administered 2017-03-16 – 2017-03-19 (×4): 1 [drp] via OPHTHALMIC
  Filled 2017-03-16: qty 2.5

## 2017-03-16 MED ORDER — FUROSEMIDE 10 MG/ML IJ SOLN
80.0000 mg | Freq: Four times a day (QID) | INTRAMUSCULAR | Status: AC
  Administered 2017-03-16 – 2017-03-17 (×6): 80 mg via INTRAVENOUS
  Filled 2017-03-16 (×6): qty 8

## 2017-03-16 MED ORDER — INSULIN ASPART 100 UNIT/ML ~~LOC~~ SOLN
0.0000 [IU] | Freq: Three times a day (TID) | SUBCUTANEOUS | Status: DC
  Administered 2017-03-17 – 2017-03-19 (×4): 2 [IU] via SUBCUTANEOUS
  Administered 2017-03-19: 3 [IU] via SUBCUTANEOUS
  Administered 2017-03-20: 2 [IU] via SUBCUTANEOUS

## 2017-03-16 MED ORDER — LINAGLIPTIN 5 MG PO TABS
5.0000 mg | ORAL_TABLET | Freq: Every day | ORAL | Status: DC
  Administered 2017-03-17 – 2017-03-20 (×4): 5 mg via ORAL
  Filled 2017-03-16 (×4): qty 1

## 2017-03-16 MED ORDER — SODIUM CHLORIDE 0.9 % IV SOLN: 250.0000 mL | INTRAVENOUS | Status: DC | PRN

## 2017-03-16 MED ORDER — ONDANSETRON HCL 4 MG/2ML IJ SOLN: 4.0000 mg | Freq: Four times a day (QID) | INTRAMUSCULAR | Status: DC | PRN

## 2017-03-16 MED ORDER — ALLOPURINOL 100 MG PO TABS
100.0000 mg | ORAL_TABLET | Freq: Every day | ORAL | Status: DC
  Administered 2017-03-17 – 2017-03-20 (×4): 100 mg via ORAL
  Filled 2017-03-16 (×4): qty 1

## 2017-03-16 NOTE — Progress Notes (Signed)
Patient arrived to the unit via wheelchair.  Direct admit from Dr Verl DickerGanji's office.  Drove self to the hospital.  Oriented to the unit, meal times, and call light use. No needs  Voiced at this time. Elnita MaxwellSalome N Glennda Weatherholtz, RN

## 2017-03-16 NOTE — H&P (Signed)
[name] [name] is an 83 y.o. male.   Chief Complaint: Dyspnea on exertion HPI: [name] [name]  is a 83 y.o. male  With diabetes, hypertension, hyperlipidemia, BPH, osteoarthritis, chronic kidney disease stage III and recently over last one month stage 4, CAD with inferior MI in September 2016 when he presented with feeling poorly and shortness of breath and an abnormal EKG. Due to late presentation and completed infarct he was managed medically.   He has chronic leg edema. Venous insufficiency study was negative for incompetency on 01/17/2017 and nuclear stress test on 01/13/2017  revealed inferior scar with mild peri-infarct ischemia with preserved LVEF and echocardiogram revealed normal LVEF with severe biatrial enlargement and mild aortic stenosis and moderate pulmonary hypertension.   I had seen him about one week ago when he gained weight in 2 months about 30 pounds, had 3+ edema had worsening renal function and marked dyspnea.  I had increased the dose of furosemide and had a long discussion to make his eating habits different.  However he now presents here to the office a week later and no change in his symptoms and leg edema persists, no change in weight.  As he is severely symptomatic with class 3-4 heart failure, I have recommended hospitalization.  He denies any palpitations, chest pain, claudication symptoms, bleeding diathesis, he is presently on Eliquis for atrial fibrillation.  His recent labs have shown stable CBC with slight worsening BMP a week ago and elevated BNP.   Past Medical History:  Diagnosis Date  . BPH with elevated PSA   . Chest pain   . Chronic kidney disease, stage III (moderate)   . DM (diabetes mellitus), type 2 with renal complications (HCC)   . ED (erectile dysfunction)   . Glaucoma   . Hyperlipidemia   . Hypertension   . Microalbuminuria   . Microalbuminuria 2013  . Obesity   . Osteoarthritis   . Prostatic hypertrophy, benign, with obstruction   .  Renal calculus   . Sick sinus syndrome [facility])     Past Surgical History:  Procedure Laterality Date  . KNEE SURGERY    . REPLACEMENT TOTAL KNEE  02/26/01    Family History  Problem Relation Age of Onset  . Cancer Paternal Grandfather        STOMACH  . Cancer Sister        MALE CANCER  . Cancer - Colon Sister   . Healthy Child   . Healthy Child   . Other Child        BRAIN DAMAGE AT BIRTH  . Stroke Father    Social History:  reports that he has quit smoking. His smoking use included Cigarettes. He has never used smokeless tobacco. He reports that he does not drink alcohol or use drugs.  Allergies: No Known Allergies  Review of Systems - Diffuse arthritis involving his hips and also the joints, marked is currently breathing on exertion, leg edema percent, no neurologic deficits, no bleeding diathesis, 30 pound weight gain recently, other systems negative.   Blood pressure 114/63, pulse (!) 57, temperature 97.5 F (36.4 C), temperature source Oral, resp. rate 20, height 5\' 8"  (1.727 m), weight 102.3 kg (225 lb 9.6 oz), SpO2 96 %. Body mass index is 34.3 kg/m. Body mass index is 34.3 kg/m.   General appearance: alert, cooperative, appears stated age, no distress and moderately obese Eyes: negative findings: conjunctivae and sclerae normal Neck: no carotid bruit, supple, symmetrical, trachea midline, thyroid not enlarged,  symmetric, no tenderness/mass/nodules and JVD elevated above the angle of the mouth Neck: JVP - normal, carotids 2+= without bruits Resp: decreased breath sounds at the bases, no added sounds. Chest wall: no tenderness Cardio: S1 is variable, S2 is normal, poor 6 systolic ejection murmur in the aortic area, no gallop appreciated. GI: Abdomen is mild to moderately distended,abdominal wall edema present ascites present  No tenderness. Extremities: Homans sign is negative, no sign of DVT and 3+ bilateral leg edema.  They're pitting and above-the-knee Pulses:  Bilaterally pigmented legs, rapid capillary refill present, bilateral varicose veins present, no ulcerations.  3+ bilateral leg edema.  Femoral pulses normal and difficult to feel without bruit due to obesity.  Popliteal pulse couldn't be felt and normal.  Pedal pulses are normal in spite of edema. Skin: Abdominal wall edema present, skin is stretched.   No skin breakdown. Neurologic: Grossly normal  No results found for this or any previous visit (from the past 48 hour(s)).  Labs:   Lab Results  Component Value Date   WBC 5.5 05/18/2016   HGB 13.4 05/18/2016   HCT 40.2 05/18/2016   MCV 85.7 05/18/2016   PLT 112 (L) 05/18/2016   No results for input(s): NA, K, CL, CO2, BUN, CREATININE, CALCIUM, PROT, BILITOT, ALKPHOS, ALT, AST, GLUCOSE in the last 168 hours.  Invalid input(s): LABALBU  Lipid Panel  No results found for: CHOL, TRIG, HDL, CHOLHDL, VLDL, LDLCALC  BNP (last 3 results) No results for input(s): BNP in the last 8760 hours.  HEMOGLOBIN A1C No results found for: HGBA1C, MPG  Cardiac Panel (last 3 results) No results for input(s): CKTOTAL, CKMB, TROPONINI, RELINDX in the last 8760 hours.  No results found for: CKTOTAL, CKMB, CKMBINDEX, TROPONINI   TSH No results for input(s): TSH in the last 8760 hours.   Medications Prior to Admission  Medication Sig Dispense Refill  . allopurinol (ZYLOPRIM) 100 MG tablet Take 100 mg by mouth daily.    Marland Kitchen apixaban (ELIQUIS) 5 MG TABS tablet Take 1 tablet (5 mg total) by mouth 2 (two) times daily. 180 tablet 3  . brinzolamide (AZOPT) 1 % ophthalmic suspension Place 1 drop into the right eye 2 (two) times daily.     Marland Kitchen glimepiride (AMARYL) 4 MG tablet Take 4 mg by mouth daily.    . isosorbide mononitrate (IMDUR) 60 MG 24 hr tablet Take 60 mg by mouth daily.    Marland Kitchen losartan (COZAAR) 100 MG tablet Take 100 mg by mouth daily.    . metformin (FORTAMET) 1000 MG (OSM) 24 hr tablet Take 1,000 mg by mouth daily with breakfast.    . metoprolol  succinate (TOPROL-XL) 25 MG 24 hr tablet Take 25 mg by mouth daily.    Marland Kitchen MYRBETRIQ 50 MG TB24 tablet Take 50 mg by mouth daily.    Marland Kitchen RAPAFLO 8 MG CAPS capsule Take 8 mg by mouth daily.    . saxagliptin HCl (ONGLYZA) 5 MG TABS tablet Take 5 mg by mouth daily.    . simvastatin (ZOCOR) 80 MG tablet Take 40 mg by mouth daily at 6 PM.     . torsemide (DEMADEX) 10 MG tablet Take 10 mg by mouth 2 (two) times daily.    . travoprost, benzalkonium, (TRAVATAN) 0.004 % ophthalmic solution Place 1 drop into both eyes at bedtime.    Marland Kitchen alfuzosin (UROXATRAL) 10 MG 24 hr tablet Take 10 mg by mouth daily with breakfast.    . finasteride (PROSCAR) 5 MG tablet Take 5  mg by mouth daily.  3  . furosemide (LASIX) 40 MG tablet Take 1 tablet (40 mg total) by mouth daily. (Patient taking differently: Take 40 mg by mouth 2 (two) times daily. ) 90 tablet 2  . Multiple Vitamins-Minerals (CENTRUM PO) Take 1 tablet by mouth every other day.     . nitroGLYCERIN (NITROSTAT) 0.4 MG SL tablet Place 1 tablet (0.4 mg total) under the tongue every 5 (five) minutes as needed for chest pain. 25 tablet 6  . potassium chloride (K-DUR) 10 MEQ tablet Take 1 tablet (10 mEq total) by mouth daily. (Patient not taking: Reported on 03/16/2017) 90 tablet 2      Current Facility-Administered Medications:  .  0.9 %  sodium chloride infusion, 250 mL, Intravenous, PRN, Yates DecampGanji, Fannie Gathright, MD .  acetaminophen (TYLENOL) tablet 650 mg, 650 mg, Oral, Q4H PRN, Yates DecampGanji, Raekwon Winkowski, MD .  furosemide (LASIX) injection 80 mg, 80 mg, Intravenous, Q6H, Yates DecampGanji, Krista Godsil, MD .  ondansetron (ZOFRAN) injection 4 mg, 4 mg, Intravenous, Q6H PRN, Yates DecampGanji, Manie Bealer, MD .  sodium chloride flush (NS) 0.9 % injection 3 mL, 3 mL, Intravenous, Q12H, Yates DecampGanji, Abbygayle Helfand, MD .  sodium chloride flush (NS) 0.9 % injection 3 mL, 3 mL, Intravenous, PRN, Yates DecampGanji, Owynn Mosqueda, MD  CARDIAC STUDIES:(OFFICE PROCEDURES)  EKG 01/24/2017: Atrial fibrillation with controlled ventricular response at the rate of 67 bpm, left axis  deviation, left plantar fascicular block.  Cannot exclude inferior infarct old.  Cannot exclude anteroseptal infarct old.  Normal QT interval. No significant change from EKG 01/04/2017, EKG 12/10/2015.  Lexiscan myoview stress test 01/13/2017: 1. Resting EKG demonstrated atrial fibrillation with controlled ventricular response, anterior infarct old, low-voltage complexes.  Stress EKG is nondiagnostic for ischemia as it's a pharmacologic stress test.  Patient remained asymptomatic. 2. There is a small area of infarction in the basal inferior, mid inferior and apical inferior myocardial wall(s) with very mild peri infarct ischemia. Overall left ventricular systolic function was normal without regional wall motion abnormalities.  The left ventricular ejection fraction was calculated  to be 77%.  This is a low risk study. [facility] Lexiscan myoview stress test 10/01/2015.  Echocardiogram 01/12/2017: Left ventricle cavity is normal in size. Moderate concentric hypertrophy of the left ventricle. Normal global wall motion. Visual EF is 55-60%. Unable to evaluate diastolic function due to A. Fibrillation. Left atrial cavity is severely dilated at 5.3 cm. Right atrial cavity is severely dilated. Mild calcification of the aortic valve annulus. Mild aortic valve leaflet calcification. Mildly restricted aortic valve leaflets. Mild aortic valve stenosis. Aortic valve peak pressure gradient of  21  and mean gradient of 10 mmHg, calculated aortic valve area 1.71  cm. No aortic valve regurgitation noted. Trace mitral regurgitation. Mild calcification of the mitral valve annulus. Mild to moderate tricuspid regurgitation. Moderate pulmonary hypertension. Pulmonary artery systolic pressure is estimated at 40 mm Hg. IVC is dilated with poor inspiration collapse consistent with elevated right atrial pressure. Compared to the study done on 02/29/2012, previously atrial enlargement was mild.  Aortic  stenosis and moderate pulmonary hypertension is new.  Assessment/Plan 1. Acute on chronic diastolic heart failure 2. Chronic A. FIb, rate controlled CHADVASC score 5, high risk, 6.7% 3. Hypertension 4. Acute on chronic kidney disease, previously stage III no stage IV due to cardiorenal syndrome. 5. Anasarca 6.  Type II diabetes mellitus controlled with stage IV chronic kidney disease. 7.  Coronary artery disease of the native vessel with history of myocardial infarction in 2016.  Weight 2 ischemia by nuclear stress test on 01/13/2017, inferior wall scar is evident.  Very minimal peri-infarct ischemia felt not to be significant.  Recommendation: Patient will be admitted to the hospital for IV diuresis, patient is in class 3-4 heart failure with a 30 pound weight gain.  Further recommendations will follow.   Yates Decamp, MD 03/16/2017, 5:40 PM [facility] Pager: [phone] Office: [phone] If no answer: Cell:  [phone]

## 2017-03-17 LAB — GLUCOSE, CAPILLARY
GLUCOSE-CAPILLARY: 57 mg/dL — AB (ref 65–99)
GLUCOSE-CAPILLARY: 72 mg/dL (ref 65–99)
GLUCOSE-CAPILLARY: 112 mg/dL — AB (ref 65–99)
Glucose-Capillary: 58 mg/dL — ABNORMAL LOW (ref 65–99)
Glucose-Capillary: 123 mg/dL — ABNORMAL HIGH (ref 65–99)
Glucose-Capillary: 135 mg/dL — ABNORMAL HIGH (ref 65–99)

## 2017-03-17 LAB — BASIC METABOLIC PANEL
ANION GAP: 12 (ref 5–15)
BUN: 54 mg/dL — ABNORMAL HIGH (ref 6–20)
CO2: 27 mmol/L (ref 22–32)
Calcium: 9.1 mg/dL (ref 8.9–10.3)
Chloride: 105 mmol/L (ref 101–111)
Creatinine, Ser: 2.19 mg/dL — ABNORMAL HIGH (ref 0.61–1.24)
GFR, EST AFRICAN AMERICAN: 29 mL/min — AB (ref >60)
GFR, EST NON AFRICAN AMERICAN: 25 mL/min — AB (ref >60)
GLUCOSE: 59 mg/dL — AB (ref 65–99)
POTASSIUM: 3.9 mmol/L (ref 3.5–5.1)
Sodium: 144 mmol/L (ref 135–145)

## 2017-03-17 LAB — BRAIN NATRIURETIC PEPTIDE: B Natriuretic Peptide: 656.1 pg/mL — ABNORMAL HIGH (ref 0.0–100.0)

## 2017-03-17 NOTE — Progress Notes (Signed)
Results for [name], [name] E (MRN [name]) as of 03/17/2017 11:02  Ref. Range 03/16/2017 21:07 03/17/2017 07:21 03/17/2017 07:54 03/17/2017 08:06  Glucose-Capillary Latest Ref Range: 65 - 99 mg/dL 91 58 (L) 57 (L) 72  Noted that blood sugars have been less than 100 mg/dl and had a low CBG of 57 mg/dl.  Recommend discontinuing the Amaryl due to renal function and low blood sugars.   Will continue to monitor blood sugars while in the hospital.   Smith MinceKendra Sowmya Partridge RN BSN CDE Diabetes Coordinator Pager: [phone]  8am-5pm

## 2017-03-18 LAB — BASIC METABOLIC PANEL
Anion gap: 12 (ref 5–15)
BUN: 49 mg/dL — AB (ref 6–20)
CALCIUM: 9 mg/dL (ref 8.9–10.3)
CO2: 29 mmol/L (ref 22–32)
CREATININE: 2.02 mg/dL — AB (ref 0.61–1.24)
Chloride: 102 mmol/L (ref 101–111)
GFR, EST AFRICAN AMERICAN: 32 mL/min — AB (ref >60)
GFR, EST NON AFRICAN AMERICAN: 28 mL/min — AB (ref >60)
Glucose, Bld: 96 mg/dL (ref 65–99)
Potassium: 3.4 mmol/L — ABNORMAL LOW (ref 3.5–5.1)
SODIUM: 143 mmol/L (ref 135–145)

## 2017-03-18 LAB — GLUCOSE, CAPILLARY
GLUCOSE-CAPILLARY: 89 mg/dL (ref 65–99)
GLUCOSE-CAPILLARY: 135 mg/dL — AB (ref 65–99)
Glucose-Capillary: 102 mg/dL — ABNORMAL HIGH (ref 65–99)
Glucose-Capillary: 129 mg/dL — ABNORMAL HIGH (ref 65–99)

## 2017-03-18 LAB — BRAIN NATRIURETIC PEPTIDE: B Natriuretic Peptide: 734.5 pg/mL — ABNORMAL HIGH (ref 0.0–100.0)

## 2017-03-18 MED ORDER — POTASSIUM CHLORIDE ER 10 MEQ PO TBCR
10.0000 meq | EXTENDED_RELEASE_TABLET | Freq: Three times a day (TID) | ORAL | Status: DC
  Administered 2017-03-18 – 2017-03-19 (×4): 10 meq via ORAL
  Filled 2017-03-18 (×8): qty 1

## 2017-03-18 MED ORDER — FUROSEMIDE 10 MG/ML IJ SOLN
80.0000 mg | Freq: Four times a day (QID) | INTRAMUSCULAR | Status: AC
  Administered 2017-03-18 – 2017-03-19 (×4): 80 mg via INTRAVENOUS
  Filled 2017-03-18 (×4): qty 8

## 2017-03-18 MED ORDER — POLYVINYL ALCOHOL 1.4 % OP SOLN
1.0000 [drp] | OPHTHALMIC | Status: DC | PRN
  Administered 2017-03-18: 1 [drp] via OPHTHALMIC
  Filled 2017-03-18: qty 15

## 2017-03-18 MED ORDER — ZOLPIDEM TARTRATE 5 MG PO TABS: 5.0000 mg | ORAL_TABLET | Freq: Every evening | ORAL | Status: DC | PRN

## 2017-03-18 NOTE — Progress Notes (Signed)
Subjective:  Feels much improved for the first time, states that he was able to ambulate in the hallway with mild limitation.  Objective:  Vital Signs in the last 24 hours: Temp:  [97.5 F (36.4 C)-98.4 F (36.9 C)] 98 F (36.7 C) (05/19 0534) Pulse Rate:  [53-67] 63 (05/19 0534) Resp:  [15-18] 16 (05/19 0534) BP: (99-116)/(47-68) 116/54 (05/19 0534) SpO2:  [96 %-99 %] 96 % (05/19 0534) Weight:  [91.6 kg (201 lb 14.4 oz)] 91.6 kg (201 lb 14.4 oz) (05/19 0534)  Intake/Output from previous day: 05/18 0701 - 05/19 0700 In: 960 [P.O.:960] Out: 5801 [Urine:5800; Stool:1]  Physical Exam: Neck: no carotid bruit, supple, symmetrical, trachea midline, thyroid not enlarged, symmetric, no tenderness/mass/nodules and JVD elevated above the angle of the mouth Neck: JVP - normal, carotids 2+= without bruits Resp: decreased breath sounds at the bases, no added sounds. Chest wall: no tenderness Cardio: S1 is variable, S2 is normal, poor 6 systolic ejection murmur in the aortic area, no gallop appreciated. GI: Abdomen is mild to moderately distended,abdominal wall edema present ascites present No tenderness. Extremities: Homans sign is negative, no sign of DVT and 3+ bilateral leg edema. They're pitting and above-the-knee Pulses: Bilaterally pigmented legs, rapid capillary refill present, bilateral varicose veins present, no ulcerations. 3+ bilateral leg edema.  Lab Results: BMP  Recent Labs  03/16/17 1825 03/17/17 0544 03/18/17 0416  NA 143 144 143  K 4.3 3.9 3.4*  CL 108 105 102  CO2 27 27 29   GLUCOSE 93 59* 96  BUN 60* 54* 49*  CREATININE 2.43* 2.19* 2.02*  CALCIUM 9.0 9.1 9.0  GFRNONAA 22* 25* 28*  GFRAA 26* 29* 32*   CARDIAC STUDIES:(OFFICE PROCEDURES)  EKG 01/24/2017:Atrial fibrillation with controlled ventricular response at the rate of 67 bpm, left axis deviation, left plantar fascicular block. Cannot exclude inferior infarct old. Cannot exclude anteroseptal infarct  old. Normal QT interval. No significant change from EKG 01/04/2017, EKG 12/10/2015.  Lexiscan myoview stress test 01/13/2017: 1. Resting EKG demonstrated atrial fibrillation with controlled ventricular response, anterior infarct old, low-voltage complexes. Stress EKG is nondiagnostic for ischemia as it's a pharmacologic stress test. Patient remained asymptomatic. 2. There is a small area of infarction in the basal inferior, mid inferior and apical inferior myocardial wall(s) with very mild peri infarct ischemia. Overall left ventricular systolic function was normal without regional wall motion abnormalities. The left ventricular ejection fraction was calculated to be 77%. This is a low risk study. [facility] Lex[facility]an [facility] stress test 10/01/2015.  Echocardiogram 01/12/2017: Left ventricle cavity is normal in size. Moderate concentric hypertrophy of the left ventricle. Normal global wall motion. Visual EF is 55-60%. Unable to evaluate diastolic function due to A. Fibrillation. Left atrial cavity is severely dilated at 5.3 cm. Right atrial cavity is severely dilated. Mild calcification of the aortic valve annulus. Mild aortic valve leaflet calcification. Mildly restricted aortic valve leaflets. Mild aortic valve stenosis. Aortic valve peak pressure gradient of 21 and mean gradient of 10 mmHg, calculated aortic valve area 1.71 cm. No aortic valve regurgitation noted. Trace mitral regurgitation. Mild calcification of the mitral valve annulus. Mild to moderate tricuspid regurgitation. Moderate pulmonary hypertension. Pulmonary artery systolic pressure is estimated at 40 mm Hg. IVC is dilated with poor inspiration collapse consistent with elevated right atrial pressure. Compared to the study done on 02/29/2012, previously atrial enlargement was mild. Aortic stenosis and moderate pulmonary hypertension is new.  Scheduled Meds: . allopurinol  100 mg Oral Daily  .  apixaban   2.5 mg Oral BID  . atorvastatin  40 mg Oral q1800  . brinzolamide  1 drop Both Eyes BID  . glimepiride  4 mg Oral Daily  . insulin aspart  0-15 Units Subcutaneous TID WC  . insulin aspart  3 Units Subcutaneous TID WC  . isosorbide mononitrate  60 mg Oral Daily  . linagliptin  5 mg Oral Daily  . metoprolol succinate  25 mg Oral Daily  . mirabegron ER  50 mg Oral Daily  . sodium chloride flush  3 mL Intravenous Q12H  . tamsulosin  0.4 mg Oral QPC supper  . Travoprost (BAK Free)  1 drop Both Eyes QHS   Continuous Infusions: . sodium chloride     PRN Meds:.sodium chloride, acetaminophen, ondansetron (ZOFRAN) IV, polyvinyl alcohol, sodium chloride flush  Assessment/Plan 1. Acute on chronic diastolic heart failure 2. Chronic A. FIb, rate controlled CHADVASC score 5, high risk, 6.7% 3. Hypertension 4. Acute on chronic kidney disease, previously stage III now stage IV due to cardiorenal syndrome. 5. Anasarca 6. Type II diabetes mellitus controlled with stage IV chronic kidney disease. One hypoglycemic episode. 7. Coronary artery disease of the native vessel with history of myocardial infarction in 2016. Weight 2 ischemia by nuclear stress test on 01/13/2017, inferior wall scar is evident. Very minimal peri-infarct ischemia felt not to be significant. Recommendation: Continue with furosemide every 6 hours today, and reduce it to twice a day tomorrow. Excellent urine output.  Yates Decamp, M.D. 03/18/2017, 6:23 AM [facility] Pager: [phone] Office: [phone] If no answer: [phone]

## 2017-03-18 NOTE — Progress Notes (Signed)
Notified Dr. Jacinto HalimGanji of patients BP 91/46, okay to go ahead and give Lasix as ordered.  Also requested sleep medication, order received.

## 2017-03-18 NOTE — Progress Notes (Signed)
Subjective:  Still dyspneic. Has had good UOP. Had low glycemic episodes.  Objective:  Vital Signs in the last 24 hours: Temp:  [97.5 F (36.4 C)-98.4 F (36.9 C)] 98 F (36.7 C) (05/19 0534) Pulse Rate:  [53-67] 63 (05/19 0534) Resp:  [15-18] 16 (05/19 0534) BP: (99-116)/(47-68) 116/54 (05/19 0534) SpO2:  [96 %-99 %] 96 % (05/19 0534) Weight:  [91.6 kg (201 lb 14.4 oz)] 91.6 kg (201 lb 14.4 oz) (05/19 0534)  Intake/Output from previous day: 05/18 0701 - 05/19 0700 In: 960 [P.O.:960] Out: 5801 [Urine:5800; Stool:1]  Physical Exam:  Blood pressure (!) 116/54, pulse 63, temperature 98 F (36.7 C), temperature source Oral, resp. rate 16, height 5\' 8"  (1.727 m), weight 91.6 kg (201 lb 14.4 oz), SpO2 96 %. Neck: no carotid bruit, supple, symmetrical, trachea midline, thyroid not enlarged, symmetric, no tenderness/mass/nodules and JVD elevated above the angle of the mouth Neck: JVP - normal, carotids 2+= without bruits Resp: decreased breath sounds at the bases, no added sounds. Chest wall: no tenderness Cardio: S1 is variable, S2 is normal, poor 6 systolic ejection murmur in the aortic area, no gallop appreciated. GI: Abdomen is mild to moderately distended,abdominal wall edema present ascites present  No tenderness. Extremities: Homans sign is negative, no sign of DVT and 3+ bilateral leg edema.  They're pitting and above-the-knee Pulses: Bilaterally pigmented legs, rapid capillary refill present, bilateral varicose veins present, no ulcerations.  3+ bilateral leg edema.  Lab Results: BMP  Recent Labs  03/16/17 1825 03/17/17 0544 03/18/17 0416  NA 143 144 143  K 4.3 3.9 3.4*  CL 108 105 102  CO2 27 27 29   GLUCOSE 93 59* 96  BUN 60* 54* 49*  CREATININE 2.43* 2.19* 2.02*  CALCIUM 9.0 9.1 9.0  GFRNONAA 22* 25* 28*  GFRAA 26* 29* 32*   Imaging: No results found.  CARDIAC STUDIES:(OFFICE PROCEDURES)  EKG 01/24/2017: Atrial fibrillation with controlled ventricular  response at the rate of 67 bpm, left axis deviation, left plantar fascicular block.  Cannot exclude inferior infarct old.  Cannot exclude anteroseptal infarct old.  Normal QT interval. No significant change from EKG 01/04/2017, EKG 12/10/2015.  Lexiscan myoview stress test 01/13/2017: 1. Resting EKG demonstrated atrial fibrillation with controlled ventricular response, anterior infarct old, low-voltage complexes.  Stress EKG is nondiagnostic for ischemia as it's a pharmacologic stress test.  Patient remained asymptomatic. 2. There is a small area of infarction in the basal inferior, mid inferior and apical inferior myocardial wall(s) with very mild peri infarct ischemia. Overall left ventricular systolic function was normal without regional wall motion abnormalities.  The left ventricular ejection fraction was calculated  to be 77%.  This is a low risk study. [facility] Lex[facility]an [facility] test 10/01/2015.  Echocardiogram 01/12/2017: Left ventricle cavity is normal in size. Moderate concentric hypertrophy of the left ventricle. Normal global wall motion. Visual EF is 55-60%. Unable to evaluate diastolic function due to A. Fibrillation. Left atrial cavity is severely dilated at 5.3 cm. Right atrial cavity is severely dilated. Mild calcification of the aortic valve annulus. Mild aortic valve leaflet calcification. Mildly restricted aortic valve leaflets. Mild aortic valve stenosis. Aortic valve peak pressure gradient of  21  and mean gradient of 10 mmHg, calculated aortic valve area 1.71  cm. No aortic valve regurgitation noted. Trace mitral regurgitation. Mild calcification of the mitral valve annulus. Mild to moderate tricuspid regurgitation. Moderate pulmonary hypertension. Pulmonary artery systolic pressure is estimated at 40 mm Hg.  IVC is dilated with poor inspiration collapse consistent with elevated right atrial pressure. Compared to the study done on 02/29/2012,  previously atrial enlargement was mild.  Aortic stenosis and moderate pulmonary hypertension is new.  Assessment/Plan 1. Acute on chronic diastolic heart failure 2. Chronic A. FIb, rate controlled CHADVASC score 5, high risk, 6.7% 3. Hypertension 4. Acute on chronic kidney disease, previously stage III now stage IV due to cardiorenal syndrome. 5. Anasarca 6.  Type II diabetes mellitus controlled with stage IV chronic kidney disease. One hypoglycemic episode. 7.  Coronary artery disease of the native vessel with history of myocardial infarction in 2016.  Weight 2 ischemia by nuclear stress test on 01/13/2017, inferior wall scar is evident.  Very minimal peri-infarct ischemia felt not to be significant.  Plan: I discussed with his son regarding CHF and his poor eating habits also regarding renal failure. Continue diuresis for now. Weight down by 2 Lbs. Has gained 30 Lbs in past one month or so.   Yates Decamp, M.D. 03/18/2017, 6:19 AM [facility] Pager: [phone] Office: [phone] If no answer: [phone]

## 2017-03-18 NOTE — Plan of Care (Signed)
Problem: Pain Managment: Goal: General experience of comfort will improve Outcome: Progressing Shortness of breath is improving   Problem: Tissue Perfusion: Goal: Risk factors for ineffective tissue perfusion will decrease Outcome: Progressing Maintains oxygen saturation in 90's on room air   Problem: Activity: Goal: Risk for activity intolerance will decrease Outcome: Progressing Shortness of breath improving with exertion   Problem: Fluid Volume: Goal: Ability to maintain a balanced intake and output will improve Outcome: Progressing Continues to diurese

## 2017-03-19 LAB — BASIC METABOLIC PANEL
Anion gap: 12 (ref 5–15)
BUN: 45 mg/dL — AB (ref 6–20)
CHLORIDE: 100 mmol/L — AB (ref 101–111)
CO2: 27 mmol/L (ref 22–32)
Calcium: 8.6 mg/dL — ABNORMAL LOW (ref 8.9–10.3)
Creatinine, Ser: 2.03 mg/dL — ABNORMAL HIGH (ref 0.61–1.24)
GFR calc Af Amer: 32 mL/min — ABNORMAL LOW (ref >60)
GFR calc non Af Amer: 27 mL/min — ABNORMAL LOW (ref >60)
Glucose, Bld: 116 mg/dL — ABNORMAL HIGH (ref 65–99)
Potassium: 3.4 mmol/L — ABNORMAL LOW (ref 3.5–5.1)
SODIUM: 139 mmol/L (ref 135–145)

## 2017-03-19 LAB — GLUCOSE, CAPILLARY
GLUCOSE-CAPILLARY: 163 mg/dL — AB (ref 65–99)
Glucose-Capillary: 95 mg/dL (ref 65–99)
Glucose-Capillary: 144 mg/dL — ABNORMAL HIGH (ref 65–99)
Glucose-Capillary: 98 mg/dL (ref 65–99)

## 2017-03-19 MED ORDER — FUROSEMIDE 40 MG PO TABS
40.0000 mg | ORAL_TABLET | Freq: Two times a day (BID) | ORAL | Status: DC
  Administered 2017-03-19 – 2017-03-20 (×2): 40 mg via ORAL
  Filled 2017-03-19 (×2): qty 1

## 2017-03-19 MED ORDER — POTASSIUM CHLORIDE CRYS ER 10 MEQ PO TBCR
20.0000 meq | EXTENDED_RELEASE_TABLET | Freq: Every day | ORAL | Status: DC
  Administered 2017-03-19 – 2017-03-20 (×2): 20 meq via ORAL
  Filled 2017-03-19 (×3): qty 2

## 2017-03-19 NOTE — Progress Notes (Signed)
Pt sitting up no pain, waiting on breakfast.

## 2017-03-19 NOTE — Progress Notes (Signed)
Subjective:  Patient is presently doing well, resting comfortably in bed. No complaints today. DOE continues to improve and is able to ambulate in the hallway without difficulty.  Objective:  Vital Signs in the last 24 hours: Temp:  [97.8 F (36.6 C)-98.9 F (37.2 C)] 98.4 F (36.9 C) (05/20 0943) Pulse Rate:  [50-59] 57 (05/20 0438) Resp:  [18] 18 (05/20 0943) BP: (91-102)/(45-51) 102/46 (05/20 0943) SpO2:  [98 %] 98 % (05/20 0943) Weight:  [94 kg (207 lb 3.2 oz)] 94 kg (207 lb 3.2 oz) (05/20 0438)  Intake/Output from previous day: 05/19 0701 - 05/20 0700 In: 1200 [P.O.:1200] Out: 4250 [Urine:4250]  Physical Exam:   General appearance: alert, cooperative, appears stated age and no distress Eyes: negative findings: lids and lashes normal and conjunctivae and sclerae normal Neck: no adenopathy, no carotid bruit, supple, symmetrical, trachea midline, thyroid not enlarged, symmetric, no tenderness/mass/nodules and JVD elevated above the angle of the mouth Neck: JVP - normal, carotids 2+= without bruits Resp: clear to auscultation bilaterally Chest wall: no tenderness Cardio:S1 is variable, S2 is normal, 2/6 systolic ejection murmur in the aortic area, no gallop appreciated  GI: Abdomen is mild to moderately distended, ascites present. No tenderness.  Extremities: Homans sign is negative, no sign of DVT and 2+ bilateral leg edema.  Pulses: Bilaterally pigmented legs, rapid capillary refill present, bilateral varicose veins present, no ulcerations.     Lab Results: BMP  Recent Labs  03/17/17 0544 03/18/17 0416 03/19/17 0407  NA 144 143 139  K 3.9 3.4* 3.4*  CL 105 102 100*  CO2 27 29 27   GLUCOSE 59* 96 116*  BUN 54* 49* 45*  CREATININE 2.19* 2.02* 2.03*  CALCIUM 9.1 9.0 8.6*  GFRNONAA 25* 28* 27*  GFRAA 29* 32* 32*    CBC No results for input(s): WBC, RBC, HGB, HCT, PLT, MCV, MCH, MCHC, RDW, LYMPHSABS, MONOABS, EOSABS, BASOSABS in the last 168 hours.  Invalid  input(s): NEUTRABS  HEMOGLOBIN A1C No results found for: HGBA1C, MPG  Cardiac Panel (last 3 results) No results for input(s): CKTOTAL, CKMB, TROPONINI, RELINDX in the last 8760 hours.  BNP (last 3 results) No results for input(s): PROBNP in the last 8760 hours.  TSH No results for input(s): TSH in the last 8760 hours.  CHOLESTEROL No results for input(s): CHOL in the last 8760 hours.  Hepatic Function Panel No results for input(s): PROT, ALBUMIN, AST, ALT, ALKPHOS, BILITOT, BILIDIR, IBILI in the last 8760 hours.  Imaging: No results found.  Cardiac Studies:  EKG 01/24/2017:Atrial fibrillation with controlled ventricular response at the rate of 67 bpm, left axis deviation, left plantar fascicular block. Cannot exclude inferior infarct old. Cannot exclude anteroseptal infarct old. Normal QT interval. No significant change from EKG 01/04/2017, EKG 12/10/2015.  Lexiscan myoview stress test 01/13/2017: 1. Resting EKG demonstrated atrial fibrillation with controlled ventricular response, anterior infarct old, low-voltage complexes. Stress EKG is nondiagnostic for ischemia as it's a pharmacologic stress test. Patient remained asymptomatic. 2. There is a small area of infarction in the basal inferior, mid inferior and apical inferior myocardial wall(s) with very mild peri infarct ischemia. Overall left ventricular systolic function was normal without regional wall motion abnormalities. The left ventricular ejection fraction was calculated to be 77%. This is a low risk study. No significant change from [facility] stress test 10/01/2015.  Echocardiogram 01/12/2017: Left ventricle cavity is normal in size. Moderate concentric hypertrophy of the left ventricle. Normal global wall motion. Visual EF is 55-60%. Unable  to evaluate diastolic function due to A. Fibrillation. Left atrial cavity is severely dilated at 5.3 cm. Right atrial cavity is severely dilated. Mild  calcification of the aortic valve annulus. Mild aortic valve leaflet calcification. Mildly restricted aortic valve leaflets. Mild aortic valve stenosis. Aortic valve peak pressure gradient of 21 and mean gradient of 10 mmHg, calculated aortic valve area 1.71 cm. No aortic valve regurgitation noted. Trace mitral regurgitation. Mild calcification of the mitral valve annulus. Mild to moderate tricuspid regurgitation. Moderate pulmonary hypertension. Pulmonary artery systolic pressure is estimated at 40 mm Hg. IVC is dilated with poor inspiration collapse consistent with elevated right atrial pressure. Compared to the study done on 02/29/2012, previously atrial enlargement was mild. Aortic stenosis and moderate pulmonary hypertension is new.  . allopurinol  100 mg Oral Daily  . apixaban  2.5 mg Oral BID  . atorvastatin  40 mg Oral q1800  . brinzolamide  1 drop Both Eyes BID  . glimepiride  4 mg Oral Daily  . insulin aspart  0-15 Units Subcutaneous TID WC  . insulin aspart  3 Units Subcutaneous TID WC  . isosorbide mononitrate  60 mg Oral Daily  . linagliptin  5 mg Oral Daily  . metoprolol succinate  25 mg Oral Daily  . mirabegron ER  50 mg Oral Daily  . potassium chloride  10 mEq Oral TID  . sodium chloride flush  3 mL Intravenous Q12H  . tamsulosin  0.4 mg Oral QPC supper  . Travoprost (BAK Free)  1 drop Both Eyes QHS      Assessment/Plan:  1. Acute on chronic diastolic heart failure 2. Chronic A. FIb, rate controlled CHADVASC score 5, high risk, 6.7% 3. Hypertension 4. Acute on chronic kidney disease, previously stage III nowstage IV due to cardiorenal syndrome. 5. Anasarca 6. Type II diabetes mellitus controlled with stage IV chronic kidney disease. One hypoglycemic episode. 7. Coronary artery disease of the native vessel with history of myocardial infarction in 2016. Weight 2 ischemia by nuclear stress test on 01/13/2017, inferior wall scar is evident. Very minimal  peri-infarct ischemia felt not to be significant.  Recommendation: Dyspnea has improved and continued to diuresis well. Leg edema also improved. Will switch to PO lasix BID. Will check BMP in the morning. If continues to be stable, will discharge in the morning.   Toniann FailAshton Haynes Kennedi Lizardo, FNP-C. 03/19/2017, 11:40 AM [facility] Pager: [phone] Office: [phone] If no answer: [phone]

## 2017-03-20 LAB — BASIC METABOLIC PANEL
ANION GAP: 10 (ref 5–15)
BUN: 46 mg/dL — ABNORMAL HIGH (ref 6–20)
CALCIUM: 8.3 mg/dL — AB (ref 8.9–10.3)
CO2: 27 mmol/L (ref 22–32)
CREATININE: 2.03 mg/dL — AB (ref 0.61–1.24)
Chloride: 101 mmol/L (ref 101–111)
GFR calc Af Amer: 32 mL/min — ABNORMAL LOW (ref >60)
GFR calc non Af Amer: 27 mL/min — ABNORMAL LOW (ref >60)
Glucose, Bld: 200 mg/dL — ABNORMAL HIGH (ref 65–99)
POTASSIUM: 3.2 mmol/L — AB (ref 3.5–5.1)
Sodium: 138 mmol/L (ref 135–145)

## 2017-03-20 LAB — GLUCOSE, CAPILLARY: Glucose-Capillary: 145 mg/dL — ABNORMAL HIGH (ref 65–99)

## 2017-03-20 MED ORDER — APIXABAN 2.5 MG PO TABS: 2.5000 mg | ORAL_TABLET | Freq: Two times a day (BID) | ORAL | Status: DC

## 2017-03-20 MED ORDER — LOSARTAN POTASSIUM 25 MG PO TABS: 25.0000 mg | ORAL_TABLET | Freq: Every day | ORAL | 1 refills | Status: DC

## 2017-03-20 MED ORDER — APIXABAN 5 MG PO TABS: 2.5000 mg | ORAL_TABLET | Freq: Two times a day (BID) | ORAL | Status: DC

## 2017-03-20 MED ORDER — TORSEMIDE 20 MG PO TABS: 20.0000 mg | ORAL_TABLET | Freq: Two times a day (BID) | ORAL | 0 refills | Status: DC

## 2017-03-20 MED ORDER — POTASSIUM CHLORIDE ER 10 MEQ PO TBCR: 20.0000 meq | EXTENDED_RELEASE_TABLET | Freq: Every day | ORAL | 2 refills | Status: DC

## 2017-03-20 MED ORDER — GLIMEPIRIDE 4 MG PO TABS
2.0000 mg | ORAL_TABLET | Freq: Every day | ORAL | Status: DC
Start: 2017-03-20 — End: 2017-07-26

## 2017-03-20 NOTE — Discharge Summary (Signed)
Physician Discharge Summary  Patient ID: [name] E Shankle MRN: [id number] DOB/AGE: February 17, 1925 83 y.o.  Admit date: 03/16/2017 Discharge date: 03/20/2017  Primary Discharge Diagnosis 1. Acute on chronic diastolic heart failure 2. Chronic A. FIb, rate controlled CHADVASC score 5, high risk, 6.7% 3. Hypertension 4. Acute on chronic kidney disease, previously stage III nowstage IV due to cardiorenal syndrome. 5. Anasarca 6. Type II diabetes mellitus controlled with stage IV chronic kidney disease. One hypoglycemic episode. 7. Coronary artery disease of the native vessel with history of myocardial infarction in 2016. Negative for significant ischemia by nuclear stress test on 01/13/2017, inferior wall scar is evident. Very minimal peri-infarct ischemia felt not to be significant.   Hospital Course: Patient was admitted to the hospital directly from the office as he had gained about 30 pounds in weight.  We very able to diurese close to 20 L, with weight loss of 10 kg.  His dyspnea had improved significantly.  Still had 2+ leg edema bilaterally but expect resolution over time.  Had a long discussion with the patient regarding making lifestyle changes specifically with his diet.  I also discussed with the son over the telephone regarding his diet changes that needs to be done.  Due to renal insufficiency, Eliquis dosage was reduced from 5 mg b.i.d. 22.5 mg b.i.d.  Due to hypoglycemic episodes and new renal insufficiency, the dosage of the McBride was reduced from 4 mg to 2 mg and metformin and furosemide was discontinued, switched to torsemide 20 mg b.i.d. for now which will be reduced to daily once he comes back to the office for follow-up.    Recommendations on discharge: He is advised to follow-up with his PCP specifically regarding Management of diabetes mellitus due to significant change in his medications.   Discharge Exam: Blood pressure (!) 95/51, pulse 60, temperature 98.5 F (36.9 C),  temperature source Oral, resp. rate 16, height 5\' 8"  (1.727 m), weight 94.5 kg (208 lb 6.4 oz), SpO2 97 %.   Neck: no carotid bruit, supple, symmetrical, trachea midline, thyroid not enlarged, symmetric, no tenderness/mass/nodules and JVD mildly elevated above the clavicle. -  carotids 2+= without bruits Resp: decreased breath sounds at the bases, no added sounds. Chest wall: no tenderness Cardio: S1 is variable, S2 is normal, 2/6 systolic ejection murmur in the aortic area, no gallop appreciated. GI: Abdomen is mild  distended,abdominal wall edema present but improved significantly. No tenderness. Extremities: Homans sign is negative, no sign of DVT and 2+ bilateral leg edema. They're pitting and above-the-knee Pulses: Bilaterally pigmented legs, rapid capillary refill present, bilateral varicose veins present, no ulcerations.    Labs:   Lab Results  Component Value Date   WBC 5.5 05/18/2016   HGB 13.4 05/18/2016   HCT 40.2 05/18/2016   MCV 85.7 05/18/2016   PLT 112 (L) 05/18/2016    Recent Labs Lab 03/20/17 0457  NA 138  K 3.2*  CL 101  CO2 27  BUN 46*  CREATININE 2.03*  CALCIUM 8.3*  GLUCOSE 200*    Recent Labs  03/16/17 1825 03/17/17 0544 03/18/17 0416  BNP 622.7* 656.1* 734.5*   EKG 01/24/2017:Atrial fibrillation with controlled ventricular response at the rate of 67 bpm, left axis deviation, left plantar fascicular block. Cannot exclude inferior infarct old. Cannot exclude anteroseptal infarct old. Normal QT interval. No significant change from EKG 01/04/2017, EKG 12/10/2015.  Lexiscan myoview stress test 01/13/2017: 1. Resting EKG demonstrated atrial fibrillation with controlled ventricular response, anterior infarct old, low-voltage complexes. Stress  EKG is nondiagnostic for ischemia as it's a pharmacologic stress test. Patient remained asymptomatic. 2. There is a small area of infarction in the basal inferior, mid inferior and apical inferior myocardial  wall(s) with very mild peri infarct ischemia. Overall left ventricular systolic function was normal without regional wall motion abnormalities. The left ventricular ejection fraction was calculated to be 77%. This is a low risk study. No significant change from [facility] stress test 10/01/2015.  Echocardiogram 01/12/2017: Left ventricle cavity is normal in size. Moderate concentric hypertrophy of the left ventricle. Normal global wall motion. Visual EF is 55-60%. Unable to evaluate diastolic function due to A. Fibrillation. Left atrial cavity is severely dilated at 5.3 cm. Right atrial cavity is severely dilated. Mild calcification of the aortic valve annulus. Mild aortic valve leaflet calcification. Mildly restricted aortic valve leaflets. Mild aortic valve stenosis. Aortic valve peak pressure gradient of 21 and mean gradient of 10 mmHg, calculated aortic valve area 1.71 cm. No aortic valve regurgitation noted. Trace mitral regurgitation. Mild calcification of the mitral valve annulus. Mild to moderate tricuspid regurgitation. Moderate pulmonary hypertension. Pulmonary artery systolic pressure is estimated at 40 mm Hg. IVC is dilated with poor inspiration collapse consistent with elevated right atrial pressure. Compared to the study done on 02/29/2012, previously atrial enlargement was mild. Aortic stenosis and moderate pulmonary hypertension is new.   FOLLOW UP PLANS AND APPOINTMENTS  Allergies as of 03/20/2017   No Known Allergies     Medication List    STOP taking these medications   furosemide 40 MG tablet Commonly known as:  LASIX   metformin 1000 MG (OSM) 24 hr tablet Commonly known as:  FORTAMET     TAKE these medications   allopurinol 100 MG tablet Commonly known as:  ZYLOPRIM Take 100 mg by mouth daily.   apixaban 2.5 MG Tabs tablet Commonly known as:  ELIQUIS Take 1 tablet (2.5 mg total) by mouth 2 (two) times daily. What changed:  medication  strength  how much to take   brinzolamide 1 % ophthalmic suspension Commonly known as:  AZOPT Place 1 drop into both eyes 2 (two) times daily.   CO Q 10 PO Take 1 capsule by mouth daily.   glimepiride 4 MG tablet Commonly known as:  AMARYL Take 0.5 tablets (2 mg total) by mouth daily. What changed:  how much to take   isosorbide mononitrate 60 MG 24 hr tablet Commonly known as:  IMDUR Take 60 mg by mouth daily.   losartan 25 MG tablet Commonly known as:  COZAAR Take 1 tablet (25 mg total) by mouth daily. What changed:  medication strength  how much to take   metoprolol succinate 25 MG 24 hr tablet Commonly known as:  TOPROL-XL Take 25 mg by mouth daily.   MYRBETRIQ 50 MG Tb24 tablet Generic drug:  mirabegron ER Take 50 mg by mouth daily.   nitroGLYCERIN 0.4 MG SL tablet Commonly known as:  NITROSTAT Place 1 tablet (0.4 mg total) under the tongue every 5 (five) minutes as needed for chest pain.   ONGLYZA 5 MG Tabs tablet Generic drug:  saxagliptin HCl Take 5 mg by mouth daily.   potassium chloride 10 MEQ tablet Commonly known as:  K-DUR Take 2 tablets (20 mEq total) by mouth daily. What changed:  how much to take   RAPAFLO 8 MG Caps capsule Generic drug:  silodosin Take 8 mg by mouth daily.   simvastatin 80 MG tablet Commonly known as:  ZOCOR  Take 40 mg by mouth daily at 6 PM.   torsemide 20 MG tablet Commonly known as:  DEMADEX Take 1 tablet (20 mg total) by mouth 2 (two) times daily. What changed:  medication strength  how much to take   travoprost (benzalkonium) 0.004 % ophthalmic solution Commonly known as:  TRAVATAN Place 1 drop into both eyes at bedtime.      Follow-up Information    Yates Decamp, MD Follow up on 03/28/2017.   Specialty:  Cardiology Why:  2pm appointment. Arrive 15 minutes early adn bring all medications Contact information: [address] [place] [address] [phone]          Yates Decamp,  MD 03/20/2017, 9:32 AM  Pager: [phone] Office: [phone] If no answer: [phone]

## 2017-03-20 NOTE — Progress Notes (Signed)
Patient given discharge instructions and all questions answered.  Patient discharged via wheelchair with all belongings.   

## 2017-03-28 DIAGNOSIS — I482 Chronic atrial fibrillation: Secondary | ICD-10-CM | POA: Diagnosis not present

## 2017-03-28 DIAGNOSIS — I5033 Acute on chronic diastolic (congestive) heart failure: Secondary | ICD-10-CM | POA: Diagnosis not present

## 2017-03-28 DIAGNOSIS — R0602 Shortness of breath: Secondary | ICD-10-CM | POA: Diagnosis not present

## 2017-03-28 DIAGNOSIS — R001 Bradycardia, unspecified: Secondary | ICD-10-CM | POA: Diagnosis not present

## 2017-04-03 DIAGNOSIS — I5033 Acute on chronic diastolic (congestive) heart failure: Secondary | ICD-10-CM | POA: Diagnosis not present

## 2017-04-03 DIAGNOSIS — R0602 Shortness of breath: Secondary | ICD-10-CM | POA: Diagnosis not present

## 2017-04-05 DIAGNOSIS — I5033 Acute on chronic diastolic (congestive) heart failure: Secondary | ICD-10-CM | POA: Diagnosis not present

## 2017-04-05 DIAGNOSIS — Z5309 Procedure and treatment not carried out because of other contraindication: Secondary | ICD-10-CM | POA: Diagnosis not present

## 2017-04-05 DIAGNOSIS — I482 Chronic atrial fibrillation: Secondary | ICD-10-CM | POA: Diagnosis not present

## 2017-04-05 DIAGNOSIS — R0602 Shortness of breath: Secondary | ICD-10-CM | POA: Diagnosis not present

## 2017-04-07 DIAGNOSIS — H401132 Primary open-angle glaucoma, bilateral, moderate stage: Secondary | ICD-10-CM | POA: Diagnosis not present

## 2017-04-13 DIAGNOSIS — I482 Chronic atrial fibrillation: Secondary | ICD-10-CM | POA: Diagnosis not present

## 2017-04-16 NOTE — H&P (Signed)
OFFICE VISIT NOTES COPIED TO EPIC FOR DOCUMENTATION  . History of Present Illness Laverda Page MD; 04/05/2017 11:04 PM) Patient words: Last OV 03/28/2017; 10 day FU acute diastolic hf, Lab results, pt complains of edema in knees and ankles.  The patient is a 83 year old male who presents for a Follow-up for Edema of lower extremities.  Additional reasons for visit:  Follow-up for Congestive heart failure is described as the following: Past medical history includes diabetes, hypertension, hyperlipidemia, BPH with elevated PSA, osteoarthritis, chronic kidney disease stage III, CAD with inferior MI in September 2016 when he presented with feeling poorly and shortness of breath and an abnormal EKG. Due to late presentation and completed infarct he was managed medically. He was with a different practice previously.  Venous insufficiency study was negative for incompetency on 01/17/2017 and nuclear stress test on 01/13/2017 revealed inferior scar with mild peri-infarct ischemia with preserved LVEF and echocardiogram revealed normal LVEF with severe biatrial enlargement and mild aortic stenosis and moderate pulmonary hypertension.  Patient was admitted to the hospital on 03/16/2017 directly from the office as he had gained about 30 pounds in weight. We were able to diurese close to 20 L, with weight loss of 10 kg. His dyspnea had improved significantly.  He now presents to the office, stating that she is shortness of breath is getting worse, again has started to develop worsening leg edema and now instead of being independent, came to our office in a wheelchair. He also has associated orthopnea. No chest pain. Feels poorly. He has been very strict with his diet and has made significant lifestyle changes.  Due to renal insufficiency, Eliquis dosage was reduced from 5 mg b.i.d. to 2.5 mg b.i.d. Due to hypoglycemic episodes and new renal insufficiency, the dosage of the Glipizide was reduced from 4  mg to 2 mg and metformin and furosemide was discontinued, switched to torsemide 20 mg b.i.d. for now with hopes of decreasing to daily once seen outpatient.   Problem List/Past Medical (April Garrison; 04/05/2017 2:53 PM) Essential hypertension (I10)  BPH with elevated PSA (N40.0)  Osteoarthritis (M19.90)  Chronic kidney disease, stage 3 (N18.3)  Inferior MI (I21.19)  Angina pectoris (I20.9)  Controlled type 2 diabetes mellitus with stage 3 chronic kidney disease, without long-term current use of insulin (E11.22)  Hypercholesteremia (E78.00)  Chronic atrial fibrillation (I48.2)  CHADVASC score 5, high risk, 6.7% Shortness of breath (R06.02)  Echocardiogram 01/12/2017: Left ventricle cavity is normal in size. Moderate concentric hypertrophy of the left ventricle. Normal global wall motion. Visual EF is 55-60%. Unable to evaluate diastolic function due to A. Fibrillation. Left atrial cavity is severely dilated at 5.3 cm. Right atrial cavity is severely dilated. Mild calcification of the aortic valve annulus. Mild aortic valve leaflet calcification. Mildly restricted aortic valve leaflets. Mild aortic valve stenosis. Aortic valve peak pressure gradient of 21 and mean gradient of 10 mmHg, calculated aortic valve area 1.71 cm. No aortic valve regurgitation noted. Trace mitral regurgitation. Mild calcification of the mitral valve annulus. Mild to moderate tricuspid regurgitation. Moderate pulmonary hypertension. Pulmonary artery systolic pressure is estimated at 40 mm Hg. IVC is dilated with poor inspiration collapse consistent with elevated right atrial pressure. Compared to the study done on 02/29/2012, previously atrial enlargement was mild. Aortic stenosis and moderate pulmonary hypertension is new. Bilateral leg edema (R60.0)  Lower extremity venous insufficiency study 01/17/2017: No evidence of DVT, no evidence of venous insufficiency. Atherosclerosis of native coronary artery of native heart  with angina pectoris (I25.119)  Lexiscan myoview stress test 01/13/2017: 1. Resting EKG demonstrated atrial fibrillation with controlled ventricular response, anterior infarct old, low-voltage complexes. Stress EKG is nondiagnostic for ischemia as it's a pharmacologic stress test. Patient remained asymptomatic. 2. There is a small area of infarction in the basal inferior, mid inferior and apical inferior myocardial wall(s) with very mild peri infarct ischemia. Overall left ventricular systolic function was normal without regional wall motion abnormalities. The left ventricular ejection fraction was calculated to be 77%. This is a low risk study. No significant change from [facility] stress test 10/01/2015. Acute on chronic diastolic HF (heart failure) (I50.33)  Labwork  04/04/2017: BUN 36, creatinine 1.8, EGFR 31, sodium 146, potassium 4.5, BMP otherwise normal. BNP 512.6. 03/20/2017: Potassium 3.2, BUNs 46, creatinine 2.03, calcium 8.3, EGFR 27. 03/18/2017: BNP 734.5. Labs 03/10/2017: BN BUN 49, creatinine 2.14, eGFR 26 mL, potassium 5.3. Hemoglobin 12.4/hematocrit 38.4, normal indicis. Platelets 154. 12/26/2016: BUN 36, creatinine 1.6, EGFR of 40.9, potassium 4.0, BMP otherwise normal. BNP 353.3. BUN 33, creatinine 1.4, CMP otherwise normal. Normal H&H MCV 100.3, MCHC 29.1, platelets 118, CBC otherwise normal. TSH 1.2. H/O myocardial infarction, greater than 8 weeks (I25.2) [08/12/2015]: MI in 08/12/2015  Allergies (April Garrison; April 07, 2017 2:53 PM) No Known Drug Allergies [01/04/2017]:  Family History (April Louretta Shorten; 04/07/17 2:53 PM) Mother  Deceased. at age 33, tachycardia Father  Deceased. at age 1, MI at age 21 Sister 3  1 older (passed), 2 younger( 1 passed) no heart conditions  Social History (April Garrison; 2017-04-07 2:53 PM) Current tobacco use  Never smoker. Non Drinker/No Alcohol Use  Marital status  Widowed. Living Situation  Lives alone. Number of Children   3.  Past Surgical History (April Garrison; 07-Apr-2017 2:53 PM) Hernia Repair [1998]:  Medication History Laverda Page, MD; Apr 07, 2017 3:53 PM) Torsemide (20MG Tablet, 1 Tablet Oral two times daily, Taken starting 03/28/2017) Active. Losartan Potassium (50MG Tablet, 1 Tablet Oral daily, Taken starting 03/28/2017) Discontinued: renal failure. Klor-Con 10 (10MEQ Tablet ER, 1 Tablet Tablet Tablet Tablet Oral daily as needed with furosemide, Taken starting 01/24/2017) Active. Isosorbide Mononitrate ER (60MG Tablet ER 24HR, 1 Oral daily) Active. Rapaflo (8MG Capsule, 1 Oral daily) Active. Allopurinol (100MG Tablet, 1 Oral daily) Active. Glimepiride (4MG Tablet, 1 Oral daily) Active. Eliquis (5MG Tablet, 1/2 Oral two times daily) Active. Nitroglycerin (0.4MG Tab Sublingual, Sublingual as needed) Active. Myrbetriq (50MG Tablet ER 24HR, 1 Oral daily) Active. Terra Bella (1 Oral daily) Active. Co Q 10 (100MG Capsule, 1 Oral daily) Active. Simvastatin (80MG Tablet, 1/2 Oral every evening after dinner) Active. Finasteride (5MG Tablet, 1 Oral daily) Active. Travatan Z (0.004% Solution, 1 drop into both eyes Ophthalmic at bedtime) Active. Alfuzosin HCl ER (10MG Tablet ER 24HR, 1 Oral daily) Active. Medications Reconciled (verbally with pt; medication present)  Diagnostic Studies History (April Garrison; 07-Apr-2017 2:53 PM) Nuclear stress test  [facility] stress test 10/01/2015: There was a medium-sized, severe, fixed basal to apical inferior and inferolateral perfusion defect. This is suggestive of prior infarction. However, wall motion is normal with normal EF. I do not think this is artifactual. Needs clinical correlation. Overall, I think that this is an intermediate risk study. Echocardiogram  03/07/2016: LVEF 60-65%. Moderate LVH. Mild aortic stenosis. Left atrium is mildly dilated. Systolic pulmonary artery pressure mildly increased with PA peak pressure  35 mmHg. Echocardiogram [01/12/2017]: Left ventricle cavity is normal in size. Moderate concentric hypertrophy of the left ventricle. Normal global wall  motion. Visual EF is 55-60%. Unable to evaluate diastolic function due to A. Fibrillation. Left atrial cavity is severely dilated at 5.3 cm. Right atrial cavity is severely dilated. Mild calcification of the aortic valve annulus. Mild aortic valve leaflet calcification. Mildly restricted aortic valve leaflets. Mild aortic valve stenosis. Aortic valve peak pressure gradient of 21 and mean gradient of 10 mmHg, calculated aortic valve area 1.71 cm. No aortic valve regurgitation noted. Trace mitral regurgitation. Mild calcification of the mitral valve annulus. Mild to moderate tricuspid regurgitation. Moderate pulmonary hypertension. Pulmonary artery systolic pressure is estimated at 40 mm Hg. IVC is dilated with poor inspiration collapse consistent with elevated right atrial pressure. Compared to the study done on 02/29/2012, previously atrial enlargement was mild. Aortic stenosis and moderate pulmonary hypertension is new. Chest X-ray  12/10/2015: No evidence for acute cardiopulmonary abnormality. Colonoscopy [2012]: VENOUS DUPLEX  12/01/2016: Negative for any DVT or venous insufficiency.    Review of Systems Laverda Page MD; 04/05/2017 11:05 PM) General Present- Fatigue. Not Present- Appetite Loss and Weight Gain. Respiratory Not Present- Chronic Cough and Wakes up from Sleep Wheezing or Short of Breath. Cardiovascular Present- Difficulty Breathing On Exertion (slight worsening since discharge) and Edema (stable). Not Present- Chest Pain, Difficulty Breathing Lying Down and Palpitations. Gastrointestinal Not Present- Black, Tarry Stool and Difficulty Swallowing. Musculoskeletal Not Present- Decreased Range of Motion and Muscle Atrophy. Neurological Not Present- Attention Deficit. Psychiatric Not Present- Personality Changes and Suicidal  Ideation. Endocrine Not Present- Cold Intolerance and Heat Intolerance. Hematology Not Present- Abnormal Bleeding. All other systems negative  Vitals Laverda Page MD; 04/05/2017 3:43 PM) 04/05/2017 3:04 PM Weight: 222 lb Height: 67in Body Surface Area: 2.11 m Body Mass Index: 34.77 kg/m  Pulse: 57 (Regular)  P.OX: 97% (Room air) BP: 106/53 (Sitting, Left Arm, Standard)       Physical Exam Laverda Page MD; 04/05/2017 11:05 PM) General Mental Status-Alert. General Appearance-Cooperative and Appears stated age. Build & Nutrition-Well built and Mildly obese.  Head and Neck Thyroid Gland Characteristics - normal size and consistency and no palpable nodules.  Chest and Lung Exam Chest and lung exam reveals -quiet, even and easy respiratory effort with no use of accessory muscles, non-tender and on auscultation, normal breath sounds, no adventitious sounds.  Cardiovascular Cardiovascular examination reveals -carotid auscultation reveals no bruits and abdominal aorta auscultation reveals no bruits and no prominent pulsation. Inspection Jugular vein - Right - Distended. Palpation/Percussion Hepatojugular Reflux - Mild. Auscultation Rhythm - Irregular and Bradycardic. Heart Sounds - S1 is variable, S2 WNL and No gallop present. Note: diminished. Murmurs & Other Heart Sounds: Murmur - Location - Apex. Timing - Mid-systolic. Grade - II/VI.  Abdomen Inspection Contour - Normal. Palpation/Percussion Normal exam - Non Tender and No hepatosplenomegaly.  Peripheral Vascular Lower Extremity Inspection - Bilateral - Pigmented, Rapid capillary refill and Varicose veins, No Varicose ulcers. Palpation - Edema - Bilateral - 2+ Pitting edema. Femoral pulse - Bilateral - Normal. Popliteal pulse - Bilateral - Normal. Dorsalis pedis pulse - Bilateral - Normal. Posterior tibial pulse - Bilateral - Normal.  Neurologic Neurologic evaluation reveals -alert and  oriented x 3 with no impairment of recent or remote memory. Motor-Grossly intact without any focal deficits.  Musculoskeletal Global Assessment Left Lower Extremity - no deformities, masses or tenderness, no known fractures. Right Lower Extremity - no deformities, masses or tenderness, no known fractures.  Assessment & Plan Laverda Page MD; 04/05/2017 11:08 PM) Acute on chronic diastolic HF (heart failure) (I50.33)  Current Plans Discontinued Losartan Potassium 50MG (Renal failure). Chronic atrial fibrillation (I48.2) Story: CHADVASC score 5, high risk, 6.7% Impression: EKG 03/28/2017: Atrial fibrillation with controlled ventricular response at a rate of 62 bpm, low-voltage complexes, anteroseptal infarct old, nonspecific T-wave flattening. Abnormal EKG. Unchanged from previous EKG on 03/10/2017. Current Plans METABOLIC PANEL, BASIC (52589) CBC & PLATELETS (AUTO) (48347) PT (PROTHROMBIN TIME) (58307) Shortness of breath (R06.02) Story: Echocardiogram 01/12/2017: Left ventricle cavity is normal in size. Moderate concentric hypertrophy of the left ventricle. Normal global wall motion. Visual EF is 55-60%. Unable to evaluate diastolic function due to A. Fibrillation. Left atrial cavity is severely dilated at 5.3 cm. Right atrial cavity is severely dilated. Mild calcification of the aortic valve annulus. Mild aortic valve leaflet calcification. Mildly restricted aortic valve leaflets. Mild aortic valve stenosis. Aortic valve peak pressure gradient of 21 and mean gradient of 10 mmHg, calculated aortic valve area 1.71 cm. No aortic valve regurgitation noted. Trace mitral regurgitation. Mild calcification of the mitral valve annulus. Mild to moderate tricuspid regurgitation. Moderate pulmonary hypertension. Pulmonary artery systolic pressure is estimated at 40 mm Hg. IVC is dilated with poor inspiration collapse consistent with elevated right atrial pressure. Compared to the study done on  02/29/2012, previously atrial enlargement was mild. Aortic stenosis and moderate pulmonary hypertension is new. Contraindication to beta-blockers (Z53.09) Story: Bradycardia Labwork 04/14/2017: BUN 41, creatinine 1.84, EGFR 32, potassium 4.7, BMP otherwise normal.  RBC 4.0, hemoglobin 11.7, hematocrit 33.8, RDW 17.0%, CBC otherwise normal.  INR 1.1, prothrombin time 12.0. 04/04/2017: BUN 36, creatinine 1.8, EGFR 31, sodium 146, potassium 4.5, BMP otherwise normal. BNP 512.6.  03/20/2017: Potassium 3.2, BUNs 46, creatinine 2.03, calcium 8.3, EGFR 27.  03/18/2017: BNP 734.5.  Labs 03/10/2017: BN BUN 49, creatinine 2.14, eGFR 26 mL, potassium 5.3. Hemoglobin 12.4/hematocrit 38.4, normal indicis. Platelets 154.  12/26/2016: BUN 36, creatinine 1.6, EGFR of 40.9, potassium 4.0, BMP otherwise normal. BNP 353.3. BUN 33, creatinine 1.4, CMP otherwise normal. Normal H&H MCV 100.3, MCHC 29.1, platelets 118, CBC otherwise normal. TSH 1.2. Bilateral leg edema (R60.0) Story: Lower extremity venous insufficiency study 01/17/2017: No evidence of DVT, no evidence of venous insufficiency. Controlled type 2 diabetes mellitus with stage 3 chronic kidney disease, without long-term current use of insulin (E11.22) Current Plans D/W [name] over the telephone regarding patient's continued worsening symptoms. I had a long discussion with the patient regarding was sitting with coronary angiography and right heart catheterization and associated risk especially renal failure and potentially a 2-3% risk of cardiac mortality. Also given the option of medical therapy only, patient states that "something needs to be done"  I contacted his son was performed and left message but I did discuss with his daughter on the telephone regarding proceeding with angiography and related complications. I will discontinuelosartan due to persistent renal dysfunction and also to reduce contrast nephropathy. Otherwise no changes in the  medications were done today and we'll await heart catheterization results. I'll see him back after this.  CC Dr. Domenick Gong.  Signed by Laverda Page, MD (04/05/2017 11:08 PM)

## 2017-04-18 ENCOUNTER — Encounter (HOSPITAL_COMMUNITY)
Admission: RE | Disposition: A | Discharge status: Home / Self Care | Payer: Self-pay | Source: Ambulatory Visit | Attending: Cardiology

## 2017-04-18 ENCOUNTER — Ambulatory Visit (HOSPITAL_COMMUNITY)
Admission: RE | Admit: 2017-04-18 | Discharge: 2017-04-18 | Disposition: A | Discharge status: Home / Self Care | Payer: Medicare Other | Source: Ambulatory Visit | Attending: Cardiology | Admitting: Cardiology

## 2017-04-18 DIAGNOSIS — E1122 Type 2 diabetes mellitus with diabetic chronic kidney disease: Secondary | ICD-10-CM | POA: Insufficient documentation

## 2017-04-18 DIAGNOSIS — N400 Enlarged prostate without lower urinary tract symptoms: Secondary | ICD-10-CM | POA: Insufficient documentation

## 2017-04-18 DIAGNOSIS — I25118 Atherosclerotic heart disease of native coronary artery with other forms of angina pectoris: Secondary | ICD-10-CM | POA: Diagnosis not present

## 2017-04-18 DIAGNOSIS — M1990 Unspecified osteoarthritis, unspecified site: Secondary | ICD-10-CM | POA: Diagnosis not present

## 2017-04-18 DIAGNOSIS — E7800 Pure hypercholesterolemia, unspecified: Secondary | ICD-10-CM | POA: Insufficient documentation

## 2017-04-18 DIAGNOSIS — N184 Chronic kidney disease, stage 4 (severe): Secondary | ICD-10-CM | POA: Insufficient documentation

## 2017-04-18 DIAGNOSIS — I2584 Coronary atherosclerosis due to calcified coronary lesion: Secondary | ICD-10-CM | POA: Insufficient documentation

## 2017-04-18 DIAGNOSIS — R0609 Other forms of dyspnea: Secondary | ICD-10-CM | POA: Diagnosis not present

## 2017-04-18 DIAGNOSIS — I130 Hypertensive heart and chronic kidney disease with heart failure and stage 1 through stage 4 chronic kidney disease, or unspecified chronic kidney disease: Secondary | ICD-10-CM | POA: Insufficient documentation

## 2017-04-18 DIAGNOSIS — I252 Old myocardial infarction: Secondary | ICD-10-CM | POA: Diagnosis not present

## 2017-04-18 DIAGNOSIS — Z7901 Long term (current) use of anticoagulants: Secondary | ICD-10-CM | POA: Insufficient documentation

## 2017-04-18 DIAGNOSIS — I2510 Atherosclerotic heart disease of native coronary artery without angina pectoris: Secondary | ICD-10-CM | POA: Insufficient documentation

## 2017-04-18 DIAGNOSIS — I272 Pulmonary hypertension, unspecified: Secondary | ICD-10-CM | POA: Insufficient documentation

## 2017-04-18 DIAGNOSIS — I2582 Chronic total occlusion of coronary artery: Secondary | ICD-10-CM | POA: Diagnosis not present

## 2017-04-18 DIAGNOSIS — I350 Nonrheumatic aortic (valve) stenosis: Secondary | ICD-10-CM | POA: Insufficient documentation

## 2017-04-18 DIAGNOSIS — I5033 Acute on chronic diastolic (congestive) heart failure: Secondary | ICD-10-CM | POA: Diagnosis not present

## 2017-04-18 DIAGNOSIS — I482 Chronic atrial fibrillation: Secondary | ICD-10-CM | POA: Diagnosis not present

## 2017-04-18 DIAGNOSIS — I5031 Acute diastolic (congestive) heart failure: Secondary | ICD-10-CM | POA: Diagnosis not present

## 2017-04-18 HISTORY — PX: CORONARY PRESSURE/FFR STUDY: CATH118243

## 2017-04-18 LAB — POCT I-STAT 3, VENOUS BLOOD GAS (G3P V)
Acid-base deficit: 2 mmol/L (ref 0.0–2.0)
BICARBONATE: 24 mmol/L (ref 20.0–28.0)
O2 SAT: 60 %
PCO2 VEN: 44 mmHg (ref 44.0–60.0)
PO2 VEN: 33 mmHg (ref 32.0–45.0)
TCO2: 25 mmol/L (ref 0–100)
pH, Ven: 7.344 (ref 7.250–7.430)

## 2017-04-18 LAB — GLUCOSE, CAPILLARY
GLUCOSE-CAPILLARY: 82 mg/dL (ref 65–99)
Glucose-Capillary: 76 mg/dL (ref 65–99)

## 2017-04-18 LAB — POCT I-STAT 3, ART BLOOD GAS (G3+)
Acid-base deficit: 3 mmol/L — ABNORMAL HIGH (ref 0.0–2.0)
BICARBONATE: 22.2 mmol/L (ref 20.0–28.0)
O2 Saturation: 93 %
PCO2 ART: 41 mmHg (ref 32.0–48.0)
PH ART: 7.342 — AB (ref 7.350–7.450)
PO2 ART: 71 mmHg — AB (ref 83.0–108.0)
TCO2: 23 mmol/L (ref 0–100)

## 2017-04-18 LAB — POCT ACTIVATED CLOTTING TIME
ACTIVATED CLOTTING TIME: 241 s
Activated Clotting Time: 158 s

## 2017-04-18 SURGERY — INTRAVASCULAR PRESSURE WIRE/FFR STUDY
Anesthesia: LOCAL

## 2017-04-18 MED ORDER — HEPARIN (PORCINE) IN NACL 2-0.9 UNIT/ML-% IJ SOLN
INTRAMUSCULAR | Status: AC
  Filled 2017-04-18: qty 500

## 2017-04-18 MED ORDER — SODIUM CHLORIDE 0.9 % IV SOLN: 250.0000 mL | INTRAVENOUS | Status: DC | PRN

## 2017-04-18 MED ORDER — SODIUM CHLORIDE 0.9% FLUSH: 3.0000 mL | INTRAVENOUS | Status: DC | PRN

## 2017-04-18 MED ORDER — IOPAMIDOL (ISOVUE-370) INJECTION 76%
INTRAVENOUS | Status: DC | PRN
  Administered 2017-04-18: 70 mL via INTRA_ARTERIAL

## 2017-04-18 MED ORDER — NITROGLYCERIN 1 MG/10 ML FOR IR/CATH LAB
INTRA_ARTERIAL | Status: AC
  Filled 2017-04-18: qty 10

## 2017-04-18 MED ORDER — ASPIRIN 81 MG PO CHEW
81.0000 mg | CHEWABLE_TABLET | ORAL | Status: AC
  Administered 2017-04-18: 81 mg via ORAL

## 2017-04-18 MED ORDER — LIDOCAINE HCL (PF) 1 % IJ SOLN
INTRAMUSCULAR | Status: DC | PRN
  Administered 2017-04-18: 2 mL

## 2017-04-18 MED ORDER — VERAPAMIL HCL 2.5 MG/ML IV SOLN
INTRAVENOUS | Status: AC
  Filled 2017-04-18: qty 2

## 2017-04-18 MED ORDER — MIDAZOLAM HCL 2 MG/2ML IJ SOLN
INTRAMUSCULAR | Status: DC | PRN
  Administered 2017-04-18: 1 mg via INTRAVENOUS

## 2017-04-18 MED ORDER — SODIUM CHLORIDE 0.9% FLUSH: 3.0000 mL | Freq: Two times a day (BID) | INTRAVENOUS | Status: DC

## 2017-04-18 MED ORDER — FENTANYL CITRATE (PF) 100 MCG/2ML IJ SOLN
INTRAMUSCULAR | Status: AC
  Filled 2017-04-18: qty 2

## 2017-04-18 MED ORDER — ADENOSINE 12 MG/4ML IV SOLN
INTRAVENOUS | Status: AC
  Filled 2017-04-18: qty 4

## 2017-04-18 MED ORDER — ADENOSINE (DIAGNOSTIC) 140MCG/KG/MIN
INTRAVENOUS | Status: DC | PRN
  Administered 2017-04-18: 140 ug/kg/min via INTRAVENOUS

## 2017-04-18 MED ORDER — ASPIRIN 81 MG PO CHEW
CHEWABLE_TABLET | ORAL | Status: AC
  Administered 2017-04-18: 81 mg via ORAL
  Filled 2017-04-18: qty 1

## 2017-04-18 MED ORDER — SODIUM CHLORIDE 0.9 % IV SOLN
INTRAVENOUS | Status: DC
  Administered 2017-04-18: 08:00:00 via INTRAVENOUS

## 2017-04-18 MED ORDER — MIDAZOLAM HCL 2 MG/2ML IJ SOLN
INTRAMUSCULAR | Status: AC
  Filled 2017-04-18: qty 2

## 2017-04-18 MED ORDER — HEPARIN (PORCINE) IN NACL 2-0.9 UNIT/ML-% IJ SOLN
INTRAMUSCULAR | Status: AC | PRN
  Administered 2017-04-18: 1000 mL

## 2017-04-18 MED ORDER — LIDOCAINE HCL (PF) 1 % IJ SOLN
INTRAMUSCULAR | Status: AC
  Filled 2017-04-18: qty 30

## 2017-04-18 MED ORDER — SODIUM CHLORIDE 0.9 % IV SOLN: INTRAVENOUS | Status: DC

## 2017-04-18 MED ORDER — HEPARIN SODIUM (PORCINE) 1000 UNIT/ML IJ SOLN
INTRAMUSCULAR | Status: AC
  Filled 2017-04-18: qty 1

## 2017-04-18 MED ORDER — HEPARIN SODIUM (PORCINE) 1000 UNIT/ML IJ SOLN
INTRAMUSCULAR | Status: DC | PRN
  Administered 2017-04-18: 5000 [IU] via INTRAVENOUS

## 2017-04-18 MED ORDER — FENTANYL CITRATE (PF) 100 MCG/2ML IJ SOLN
INTRAMUSCULAR | Status: DC | PRN
  Administered 2017-04-18: 25 ug via INTRAVENOUS

## 2017-04-18 SURGICAL SUPPLY — 13 items
CATH BALLN WEDGE 5F 110CM (CATHETERS) ×1 IMPLANT
CATH OPTITORQUE TIG 4.0 5F (CATHETERS) ×1 IMPLANT
GLIDESHEATH SLEND A-KIT 6F 20G (SHEATH) ×2 IMPLANT
GUIDEWIRE .025 260CM (WIRE) ×1 IMPLANT
GUIDEWIRE INQWIRE 1.5J.035X260 (WIRE) IMPLANT
GUIDEWIRE PRESSURE COMET II (WIRE) ×1 IMPLANT
INQWIRE 1.5J .035X260CM (WIRE) ×2
KIT ESSENTIALS PG (KITS) ×1 IMPLANT
KIT HEART LEFT (KITS) ×2 IMPLANT
PACK CARDIAC CATHETERIZATION (CUSTOM PROCEDURE TRAY) ×2 IMPLANT
SHEATH GLIDE SLENDER 4/5FR (SHEATH) ×1 IMPLANT
TRANSDUCER W/STOPCOCK (MISCELLANEOUS) ×2 IMPLANT
TUBING CIL FLEX 10 FLL-RA (TUBING) ×2 IMPLANT

## 2017-04-18 NOTE — Progress Notes (Signed)
Janne NapoleonJennifer O'Neal, RN made aware that pt came in early for hydration and only order is for NS at 10 ml/hour.

## 2017-04-18 NOTE — Interval H&P Note (Signed)
History and Physical Interval Note:  04/18/2017 10:31 AM  [name] E Tuch  has presented today for surgery, with the diagnosis of cad, sob, cp  The various methods of treatment have been discussed with the patient and family. After consideration of risks, benefits and other options for treatment, the patient has consented to  Procedure(s): Right/Left Heart Cath and Coronary Angiography (N/A) and possible PCI as a surgical intervention .  The patient's history has been reviewed, patient examined, no change in status, stable for surgery.  I have reviewed the patient's chart and labs.  Questions were answered to the patient's satisfaction.    Ischemic Symptoms? CCS IV (Inability to carry on any physical activity without discomfort) Anti-ischemic Medical Therapy? Minimal Therapy (1 class of medications) Non-invasive Test Results? No non-invasive testing performed Prior CABG? No Previous CABG   Patient Information:   1-2V CAD, no prox LAD  A (7)  Indication: 20; Score: 7   Patient Information:   1-2V-CAD with DS 50-60% With No FFR, No IVUS  I (3)  Indication: 21; Score: 3   Patient Information:   1-2V-CAD with DS 50-60% With FFR  A (7)  Indication: 22; Score: 7   Patient Information:   1-2V-CAD with DS 50-60% With FFR>0.8, IVUS not significant  I (2)  Indication: 23; Score: 2   Patient Information:   3V-CAD without LMCA With Abnormal LV systolic function  A (9)  Indication: 48; Score: 9   Patient Information:   LMCA-CAD  A (9)  Indication: 49; Score: 9   Patient Information:   3V-CAD without LMCA With Low CAD burden(i.e., 3 focal stenoses, low SYNTAX score) PCI  A (7)  Indication: 63; Score: 7   Patient Information:   3V-CAD without LMCA With Low CAD burden(i.e., 3 focal stenoses, low SYNTAX score) CABG  A (9)  Indication: 63; Score: 9   Patient Information:   3V-CAD without LMCA E06c - Intermediate-high CAD burden (i.e., multiple diffuse  lesions, presence of CTO, or high SYNTAX score) PCI  U (4)  Indication: 64; Score: 4   Patient Information:   3V-CAD without LMCA E06c - Intermediate-high CAD burden (i.e., multiple diffuse lesions, presence of CTO, or high SYNTAX score) CABG  A (9)  Indication: 64; Score: 9   Patient Information:   LMCA-CAD With Isolated LMCA stenosis  PCI  U (6)  Indication: 65; Score: 6   Patient Information:   LMCA-CAD With Isolated LMCA stenosis  CABG  A (9)  Indication: 65; Score: 9   Patient Information:   LMCA-CAD Additional CAD, low CAD burden (i.e., 1- to 2-vessel additional involvement, low SYNTAX score) PCI  U (5)  Indication: 66; Score: 5   Patient Information:   LMCA-CAD Additional CAD, low CAD burden (i.e., 1- to 2-vessel additional involvement, low SYNTAX score) CABG  A (9)  Indication: 66; Score: 9   Patient Information:   LMCA-CAD Additional CAD, intermediate-high CAD burden (i.e., 3-vessel involvement, presence of CTO, or high SYNTAX score) PCI  I (3)  Indication: 67; Score: 3   Patient Information:   LMCA-CAD Additional CAD, intermediate-high CAD burden (i.e., 3-vessel involvement, presence of CTO, or high SYNTAX score) CABG  A (9)  Indication: 67; Score: 9  Joeline Freer

## 2017-04-18 NOTE — Discharge Instructions (Signed)

## 2017-04-19 ENCOUNTER — Encounter (HOSPITAL_COMMUNITY): Payer: Self-pay | Admitting: Cardiology

## 2017-04-19 MED FILL — Heparin Sodium (Porcine) 2 Unit/ML in Sodium Chloride 0.9%: INTRAMUSCULAR | Qty: 500 | Status: AC

## 2017-04-19 MED FILL — Verapamil HCl IV Soln 2.5 MG/ML: INTRAVENOUS | Qty: 2 | Status: AC

## 2017-04-19 MED FILL — Nitroglycerin IV Soln 100 MCG/ML in D5W: INTRA_ARTERIAL | Qty: 10 | Status: AC

## 2017-04-21 ENCOUNTER — Encounter (HOSPITAL_COMMUNITY): Payer: Self-pay | Admitting: Cardiology

## 2017-04-26 DIAGNOSIS — I482 Chronic atrial fibrillation: Secondary | ICD-10-CM | POA: Diagnosis not present

## 2017-04-26 DIAGNOSIS — I5033 Acute on chronic diastolic (congestive) heart failure: Secondary | ICD-10-CM | POA: Diagnosis not present

## 2017-04-26 DIAGNOSIS — R0602 Shortness of breath: Secondary | ICD-10-CM | POA: Diagnosis not present

## 2017-04-26 DIAGNOSIS — I25119 Atherosclerotic heart disease of native coronary artery with unspecified angina pectoris: Secondary | ICD-10-CM | POA: Diagnosis not present

## 2017-05-09 DIAGNOSIS — R600 Localized edema: Secondary | ICD-10-CM | POA: Diagnosis not present

## 2017-05-09 DIAGNOSIS — I5033 Acute on chronic diastolic (congestive) heart failure: Secondary | ICD-10-CM | POA: Diagnosis not present

## 2017-05-09 DIAGNOSIS — I482 Chronic atrial fibrillation: Secondary | ICD-10-CM | POA: Diagnosis not present

## 2017-05-09 DIAGNOSIS — R0602 Shortness of breath: Secondary | ICD-10-CM | POA: Diagnosis not present

## 2017-05-11 DIAGNOSIS — R351 Nocturia: Secondary | ICD-10-CM | POA: Diagnosis not present

## 2017-05-11 DIAGNOSIS — R0602 Shortness of breath: Secondary | ICD-10-CM | POA: Diagnosis not present

## 2017-05-11 DIAGNOSIS — I5033 Acute on chronic diastolic (congestive) heart failure: Secondary | ICD-10-CM | POA: Diagnosis not present

## 2017-05-11 DIAGNOSIS — R3915 Urgency of urination: Secondary | ICD-10-CM | POA: Diagnosis not present

## 2017-05-11 DIAGNOSIS — I482 Chronic atrial fibrillation: Secondary | ICD-10-CM | POA: Diagnosis not present

## 2017-05-11 DIAGNOSIS — R350 Frequency of micturition: Secondary | ICD-10-CM | POA: Diagnosis not present

## 2017-05-11 DIAGNOSIS — R601 Generalized edema: Secondary | ICD-10-CM | POA: Diagnosis not present

## 2017-05-16 DIAGNOSIS — I482 Chronic atrial fibrillation: Secondary | ICD-10-CM | POA: Diagnosis not present

## 2017-05-16 DIAGNOSIS — R0602 Shortness of breath: Secondary | ICD-10-CM | POA: Diagnosis not present

## 2017-05-16 DIAGNOSIS — I5033 Acute on chronic diastolic (congestive) heart failure: Secondary | ICD-10-CM | POA: Diagnosis not present

## 2017-05-16 DIAGNOSIS — R601 Generalized edema: Secondary | ICD-10-CM | POA: Diagnosis not present

## 2017-05-23 DIAGNOSIS — N183 Chronic kidney disease, stage 3 (moderate): Secondary | ICD-10-CM | POA: Diagnosis not present

## 2017-05-23 DIAGNOSIS — I482 Chronic atrial fibrillation: Secondary | ICD-10-CM | POA: Diagnosis not present

## 2017-05-23 DIAGNOSIS — R601 Generalized edema: Secondary | ICD-10-CM | POA: Diagnosis not present

## 2017-05-23 DIAGNOSIS — R0602 Shortness of breath: Secondary | ICD-10-CM | POA: Diagnosis not present

## 2017-05-23 DIAGNOSIS — I208 Other forms of angina pectoris: Secondary | ICD-10-CM | POA: Diagnosis not present

## 2017-05-23 DIAGNOSIS — I2510 Atherosclerotic heart disease of native coronary artery without angina pectoris: Secondary | ICD-10-CM | POA: Diagnosis not present

## 2017-05-23 DIAGNOSIS — I5033 Acute on chronic diastolic (congestive) heart failure: Secondary | ICD-10-CM | POA: Diagnosis not present

## 2017-05-23 DIAGNOSIS — R600 Localized edema: Secondary | ICD-10-CM | POA: Diagnosis not present

## 2017-05-23 DIAGNOSIS — E1129 Type 2 diabetes mellitus with other diabetic kidney complication: Secondary | ICD-10-CM | POA: Diagnosis not present

## 2017-05-23 DIAGNOSIS — I252 Old myocardial infarction: Secondary | ICD-10-CM | POA: Diagnosis not present

## 2017-05-23 DIAGNOSIS — I480 Paroxysmal atrial fibrillation: Secondary | ICD-10-CM | POA: Diagnosis not present

## 2017-05-23 DIAGNOSIS — R808 Other proteinuria: Secondary | ICD-10-CM | POA: Diagnosis not present

## 2017-05-23 DIAGNOSIS — Z6833 Body mass index (BMI) 33.0-33.9, adult: Secondary | ICD-10-CM | POA: Diagnosis not present

## 2017-05-31 DIAGNOSIS — I482 Chronic atrial fibrillation: Secondary | ICD-10-CM | POA: Diagnosis not present

## 2017-05-31 DIAGNOSIS — I5033 Acute on chronic diastolic (congestive) heart failure: Secondary | ICD-10-CM | POA: Diagnosis not present

## 2017-05-31 DIAGNOSIS — R601 Generalized edema: Secondary | ICD-10-CM | POA: Diagnosis not present

## 2017-05-31 DIAGNOSIS — R0602 Shortness of breath: Secondary | ICD-10-CM | POA: Diagnosis not present

## 2017-06-02 DIAGNOSIS — R350 Frequency of micturition: Secondary | ICD-10-CM | POA: Diagnosis not present

## 2017-06-02 DIAGNOSIS — R3915 Urgency of urination: Secondary | ICD-10-CM | POA: Diagnosis not present

## 2017-06-02 DIAGNOSIS — R351 Nocturia: Secondary | ICD-10-CM | POA: Diagnosis not present

## 2017-06-05 DIAGNOSIS — R0602 Shortness of breath: Secondary | ICD-10-CM | POA: Diagnosis not present

## 2017-06-07 DIAGNOSIS — I5033 Acute on chronic diastolic (congestive) heart failure: Secondary | ICD-10-CM | POA: Diagnosis not present

## 2017-06-07 DIAGNOSIS — I482 Chronic atrial fibrillation: Secondary | ICD-10-CM | POA: Diagnosis not present

## 2017-06-07 DIAGNOSIS — R0602 Shortness of breath: Secondary | ICD-10-CM | POA: Diagnosis not present

## 2017-06-07 DIAGNOSIS — R601 Generalized edema: Secondary | ICD-10-CM | POA: Diagnosis not present

## 2017-06-12 DIAGNOSIS — I482 Chronic atrial fibrillation: Secondary | ICD-10-CM | POA: Diagnosis not present

## 2017-06-12 DIAGNOSIS — R0602 Shortness of breath: Secondary | ICD-10-CM | POA: Diagnosis not present

## 2017-06-12 DIAGNOSIS — I5033 Acute on chronic diastolic (congestive) heart failure: Secondary | ICD-10-CM | POA: Diagnosis not present

## 2017-06-12 DIAGNOSIS — R601 Generalized edema: Secondary | ICD-10-CM | POA: Diagnosis not present

## 2017-06-16 DIAGNOSIS — E1122 Type 2 diabetes mellitus with diabetic chronic kidney disease: Secondary | ICD-10-CM | POA: Diagnosis not present

## 2017-06-16 DIAGNOSIS — N183 Chronic kidney disease, stage 3 (moderate): Secondary | ICD-10-CM | POA: Diagnosis not present

## 2017-06-16 DIAGNOSIS — I2510 Atherosclerotic heart disease of native coronary artery without angina pectoris: Secondary | ICD-10-CM | POA: Diagnosis not present

## 2017-06-16 DIAGNOSIS — I5033 Acute on chronic diastolic (congestive) heart failure: Secondary | ICD-10-CM | POA: Diagnosis not present

## 2017-06-16 DIAGNOSIS — I130 Hypertensive heart and chronic kidney disease with heart failure and stage 1 through stage 4 chronic kidney disease, or unspecified chronic kidney disease: Secondary | ICD-10-CM | POA: Diagnosis not present

## 2017-06-16 DIAGNOSIS — I482 Chronic atrial fibrillation: Secondary | ICD-10-CM | POA: Diagnosis not present

## 2017-06-20 ENCOUNTER — Emergency Department (HOSPITAL_COMMUNITY): Payer: Medicare Other

## 2017-06-20 ENCOUNTER — Encounter (HOSPITAL_COMMUNITY): Payer: Self-pay | Admitting: Emergency Medicine

## 2017-06-20 ENCOUNTER — Inpatient Hospital Stay (HOSPITAL_COMMUNITY)
Admission: EM | Admit: 2017-06-20 | Discharge: 2017-06-28 | DRG: 480 | Disposition: A | Discharge status: Skilled Nursing Facility | Payer: Medicare Other | Attending: Internal Medicine | Admitting: Internal Medicine

## 2017-06-20 DIAGNOSIS — N183 Chronic kidney disease, stage 3 unspecified: Secondary | ICD-10-CM | POA: Diagnosis present

## 2017-06-20 DIAGNOSIS — R0989 Other specified symptoms and signs involving the circulatory and respiratory systems: Secondary | ICD-10-CM | POA: Diagnosis not present

## 2017-06-20 DIAGNOSIS — R55 Syncope and collapse: Secondary | ICD-10-CM | POA: Diagnosis not present

## 2017-06-20 DIAGNOSIS — E669 Obesity, unspecified: Secondary | ICD-10-CM | POA: Diagnosis present

## 2017-06-20 DIAGNOSIS — I10 Essential (primary) hypertension: Secondary | ICD-10-CM | POA: Diagnosis present

## 2017-06-20 DIAGNOSIS — I5043 Acute on chronic combined systolic (congestive) and diastolic (congestive) heart failure: Secondary | ICD-10-CM | POA: Diagnosis present

## 2017-06-20 DIAGNOSIS — I959 Hypotension, unspecified: Secondary | ICD-10-CM | POA: Diagnosis present

## 2017-06-20 DIAGNOSIS — Z79899 Other long term (current) drug therapy: Secondary | ICD-10-CM

## 2017-06-20 DIAGNOSIS — I071 Rheumatic tricuspid insufficiency: Secondary | ICD-10-CM | POA: Diagnosis present

## 2017-06-20 DIAGNOSIS — E1129 Type 2 diabetes mellitus with other diabetic kidney complication: Secondary | ICD-10-CM | POA: Diagnosis present

## 2017-06-20 DIAGNOSIS — D509 Iron deficiency anemia, unspecified: Secondary | ICD-10-CM | POA: Diagnosis present

## 2017-06-20 DIAGNOSIS — R001 Bradycardia, unspecified: Secondary | ICD-10-CM | POA: Diagnosis not present

## 2017-06-20 DIAGNOSIS — Z9889 Other specified postprocedural states: Secondary | ICD-10-CM

## 2017-06-20 DIAGNOSIS — E785 Hyperlipidemia, unspecified: Secondary | ICD-10-CM | POA: Diagnosis present

## 2017-06-20 DIAGNOSIS — N400 Enlarged prostate without lower urinary tract symptoms: Secondary | ICD-10-CM | POA: Diagnosis present

## 2017-06-20 DIAGNOSIS — Z87891 Personal history of nicotine dependence: Secondary | ICD-10-CM

## 2017-06-20 DIAGNOSIS — S72112A Displaced fracture of greater trochanter of left femur, initial encounter for closed fracture: Secondary | ICD-10-CM | POA: Diagnosis not present

## 2017-06-20 DIAGNOSIS — T148XXA Other injury of unspecified body region, initial encounter: Secondary | ICD-10-CM | POA: Diagnosis not present

## 2017-06-20 DIAGNOSIS — E1122 Type 2 diabetes mellitus with diabetic chronic kidney disease: Secondary | ICD-10-CM | POA: Diagnosis present

## 2017-06-20 DIAGNOSIS — S72102A Unspecified trochanteric fracture of left femur, initial encounter for closed fracture: Secondary | ICD-10-CM | POA: Diagnosis not present

## 2017-06-20 DIAGNOSIS — I495 Sick sinus syndrome: Secondary | ICD-10-CM | POA: Diagnosis present

## 2017-06-20 DIAGNOSIS — N184 Chronic kidney disease, stage 4 (severe): Secondary | ICD-10-CM | POA: Diagnosis present

## 2017-06-20 DIAGNOSIS — W1830XA Fall on same level, unspecified, initial encounter: Secondary | ICD-10-CM | POA: Diagnosis present

## 2017-06-20 DIAGNOSIS — I482 Chronic atrial fibrillation: Secondary | ICD-10-CM | POA: Diagnosis present

## 2017-06-20 DIAGNOSIS — Z7901 Long term (current) use of anticoagulants: Secondary | ICD-10-CM

## 2017-06-20 DIAGNOSIS — W19XXXA Unspecified fall, initial encounter: Secondary | ICD-10-CM

## 2017-06-20 DIAGNOSIS — I951 Orthostatic hypotension: Secondary | ICD-10-CM | POA: Diagnosis present

## 2017-06-20 DIAGNOSIS — I4891 Unspecified atrial fibrillation: Secondary | ICD-10-CM | POA: Diagnosis present

## 2017-06-20 DIAGNOSIS — E1165 Type 2 diabetes mellitus with hyperglycemia: Secondary | ICD-10-CM | POA: Diagnosis not present

## 2017-06-20 DIAGNOSIS — R0689 Other abnormalities of breathing: Secondary | ICD-10-CM | POA: Diagnosis not present

## 2017-06-20 DIAGNOSIS — H409 Unspecified glaucoma: Secondary | ICD-10-CM | POA: Diagnosis present

## 2017-06-20 DIAGNOSIS — E861 Hypovolemia: Secondary | ICD-10-CM | POA: Diagnosis not present

## 2017-06-20 DIAGNOSIS — I350 Nonrheumatic aortic (valve) stenosis: Secondary | ICD-10-CM | POA: Diagnosis present

## 2017-06-20 DIAGNOSIS — M549 Dorsalgia, unspecified: Secondary | ICD-10-CM

## 2017-06-20 DIAGNOSIS — R778 Other specified abnormalities of plasma proteins: Secondary | ICD-10-CM | POA: Diagnosis present

## 2017-06-20 DIAGNOSIS — I130 Hypertensive heart and chronic kidney disease with heart failure and stage 1 through stage 4 chronic kidney disease, or unspecified chronic kidney disease: Secondary | ICD-10-CM | POA: Diagnosis present

## 2017-06-20 DIAGNOSIS — Z419 Encounter for procedure for purposes other than remedying health state, unspecified: Secondary | ICD-10-CM

## 2017-06-20 DIAGNOSIS — S72002A Fracture of unspecified part of neck of left femur, initial encounter for closed fracture: Secondary | ICD-10-CM

## 2017-06-20 DIAGNOSIS — M25552 Pain in left hip: Secondary | ICD-10-CM | POA: Diagnosis not present

## 2017-06-20 DIAGNOSIS — S72142A Displaced intertrochanteric fracture of left femur, initial encounter for closed fracture: Principal | ICD-10-CM | POA: Diagnosis present

## 2017-06-20 DIAGNOSIS — Z01818 Encounter for other preprocedural examination: Secondary | ICD-10-CM | POA: Diagnosis not present

## 2017-06-20 DIAGNOSIS — Z6832 Body mass index (BMI) 32.0-32.9, adult: Secondary | ICD-10-CM

## 2017-06-20 DIAGNOSIS — K219 Gastro-esophageal reflux disease without esophagitis: Secondary | ICD-10-CM | POA: Diagnosis present

## 2017-06-20 DIAGNOSIS — R7989 Other specified abnormal findings of blood chemistry: Secondary | ICD-10-CM

## 2017-06-20 DIAGNOSIS — Z96652 Presence of left artificial knee joint: Secondary | ICD-10-CM | POA: Diagnosis present

## 2017-06-20 DIAGNOSIS — I2510 Atherosclerotic heart disease of native coronary artery without angina pectoris: Secondary | ICD-10-CM | POA: Diagnosis present

## 2017-06-20 LAB — I-STAT TROPONIN, ED: TROPONIN I, POC: 0.1 ng/mL — AB (ref 0.00–0.08)

## 2017-06-20 LAB — I-STAT CHEM 8, ED
BUN: 45 mg/dL — ABNORMAL HIGH (ref 6–20)
CREATININE: 1.8 mg/dL — AB (ref 0.61–1.24)
Calcium, Ion: 1.08 mmol/L — ABNORMAL LOW (ref 1.15–1.40)
Chloride: 103 mmol/L (ref 101–111)
Glucose, Bld: 120 mg/dL — ABNORMAL HIGH (ref 65–99)
HEMATOCRIT: 30 % — AB (ref 39.0–52.0)
HEMOGLOBIN: 10.2 g/dL — AB (ref 13.0–17.0)
POTASSIUM: 3.6 mmol/L (ref 3.5–5.1)
SODIUM: 143 mmol/L (ref 135–145)
TCO2: 26 mmol/L (ref 0–100)

## 2017-06-20 LAB — CBC WITH DIFFERENTIAL/PLATELET
BASOS ABS: 0 10*3/uL (ref 0.0–0.1)
BASOS PCT: 0 %
EOS ABS: 0.2 10*3/uL (ref 0.0–0.7)
EOS PCT: 3 %
HEMATOCRIT: 31.1 % — AB (ref 39.0–52.0)
Hemoglobin: 9.9 g/dL — ABNORMAL LOW (ref 13.0–17.0)
Lymphocytes Relative: 11 %
Lymphs Abs: 0.6 10*3/uL — ABNORMAL LOW (ref 0.7–4.0)
MCH: 29.2 pg (ref 26.0–34.0)
MCHC: 31.8 g/dL (ref 30.0–36.0)
MCV: 91.7 fL (ref 78.0–100.0)
MONO ABS: 0.4 10*3/uL (ref 0.1–1.0)
MONOS PCT: 7 %
Neutro Abs: 4.3 10*3/uL (ref 1.7–7.7)
Neutrophils Relative %: 79 %
PLATELETS: 121 10*3/uL — AB (ref 150–400)
RBC: 3.39 MIL/uL — ABNORMAL LOW (ref 4.22–5.81)
RDW: 16.7 % — AB (ref 11.5–15.5)
WBC: 5.5 10*3/uL (ref 4.0–10.5)

## 2017-06-20 LAB — I-STAT CG4 LACTIC ACID, ED: LACTIC ACID, VENOUS: 2.11 mmol/L — AB (ref 0.5–1.9)

## 2017-06-20 MED ORDER — FENTANYL CITRATE (PF) 100 MCG/2ML IJ SOLN
50.0000 ug | Freq: Once | INTRAMUSCULAR | Status: AC
  Administered 2017-06-20: 50 ug via INTRAVENOUS
  Filled 2017-06-20: qty 2

## 2017-06-20 MED ORDER — SODIUM CHLORIDE 0.9 % IV BOLUS (SEPSIS)
1000.0000 mL | Freq: Once | INTRAVENOUS | Status: AC
  Administered 2017-06-20: 1000 mL via INTRAVENOUS

## 2017-06-20 NOTE — ED Provider Notes (Signed)
[facility]-EMERGENCY DEPT Provider Note   CSN: [id number] Arrival date & time: 06/20/17  2023     History   Chief Complaint Chief Complaint  Patient presents with  . Loss of Consciousness  . Fall    HPI [name] [name] is a 83 y.o. male.  Patient lives alone, was walking in his  neighbors yard, when he had a syncopal episode found by the neighbors on the ground in the  driveway.  He is now complaining of left hip pain.  Denies shortness of breath or chest pain.  He states he is dizzy. Today he had 3 episodes of vomiting, denies any diarrhea Patient had a cardiac catheterization in June 2018, he has a history of atrial fibrillation, chronic kidney disease, diabetes,glaucoma, hypertension,hyperlipidemia,enlarged prostate, and sick sinus syndrome      Past Medical History:  Diagnosis Date  . BPH with elevated PSA   . Chest pain   . Chronic kidney disease, stage III (moderate)   . DM (diabetes mellitus), type 2 with renal complications (HCC)   . ED (erectile dysfunction)   . Glaucoma   . Hyperlipidemia   . Hypertension   . Microalbuminuria   . Microalbuminuria 2013  . Obesity   . Osteoarthritis   . Prostatic hypertrophy, benign, with obstruction   . Renal calculus   . Sick sinus syndrome [facility])     Patient Active Problem List   Diagnosis Date Noted  . Acute on chronic diastolic heart failure (HCC) 12/01/2016  . Atrial fibrillation (HCC) 02/24/2016  . CAD (coronary artery disease) 12/17/2015  . Bilateral leg weakness 09/17/2015  . Chest pain   . DM (diabetes mellitus), type 2 with renal complications (HCC)   . Hypertension   . Hyperlipidemia   . Prostatic hypertrophy, benign, with obstruction   . Sick sinus syndrome (HCC)   . Obesity   . Microalbuminuria   . Glaucoma   . Renal calculus   . Osteoarthritis   . Chronic kidney disease, stage III (moderate)   . BPH with elevated PSA   . ED (erectile dysfunction)   . RECTAL BLEEDING 04/22/2008  . DYSPHAGIA  04/22/2008  . HYPERLIPIDEMIA 04/18/2008  . HTN (hypertension) 04/18/2008  . ESOPHAGEAL STRICTURE 04/18/2008  . GERD 04/18/2008  . HIATAL HERNIA 04/18/2008  . DIVERTICULOSIS, COLON 04/18/2008  . COLONIC POLYPS, HYPERPLASTIC, HX OF 04/18/2008    Past Surgical History:  Procedure Laterality Date  . INTRAVASCULAR PRESSURE WIRE/FFR STUDY N/A 04/18/2017   Procedure: Intravascular Pressure Wire/FFR Study;  Surgeon: Yates Decamp, MD;  Location: [facility] INVASIVE CV LAB;  Service: Cardiovascular;  Laterality: N/A;  Prox LAD  . KNEE SURGERY    . REPLACEMENT TOTAL KNEE  02/26/01       Home Medications    Prior to Admission medications   Medication Sig Start Date End Date Taking? Authorizing Provider  alfuzosin (UROXATRAL) 10 MG 24 hr tablet Take 10 mg by mouth daily with breakfast.    [provider]  allopurinol (ZYLOPRIM) 100 MG tablet Take 100 mg by mouth daily.    [provider]  brinzolamide (AZOPT) 1 % ophthalmic suspension Place 1 drop into both eyes 3 (three) times daily.     [provider]  Coenzyme Q10 (CO Q 10 PO) Take 1 capsule by mouth daily.    [provider]  ELIQUIS 5 MG TABS tablet Take 2.5 mg by mouth 2 (two) times daily.  02/22/17   [provider]  finasteride (PROSCAR) 5 MG tablet Take  5 mg by mouth daily.    [provider]  glimepiride (AMARYL) 4 MG tablet Take 0.5 tablets (2 mg total) by mouth daily. 03/20/17   Yates Decamp, MD  isosorbide mononitrate (IMDUR) 60 MG 24 hr tablet Take 60 mg by mouth daily. 02/19/16   [provider]  MYRBETRIQ 50 MG TB24 tablet Take 50 mg by mouth daily. 11/27/15   [provider]  nitroGLYCERIN (NITROSTAT) 0.4 MG SL tablet Place 1 tablet (0.4 mg total) under the tongue every 5 (five) minutes as needed for chest pain. 09/17/15   Nahser, Deloris Ping, MD  RAPAFLO 8 MG CAPS capsule Take 8 mg by mouth daily. 09/15/15   [provider]  simvastatin (ZOCOR) 80 MG tablet Take 40  mg by mouth daily at 6 PM.     [provider]  torsemide (DEMADEX) 20 MG tablet Take 1 tablet (20 mg total) by mouth 2 (two) times daily. 03/20/17   Yates Decamp, MD  travoprost, benzalkonium, (TRAVATAN) 0.004 % ophthalmic solution Place 1 drop into both eyes at bedtime.    [provider]    Family History Family History  Problem Relation Age of Onset  . Cancer Paternal Grandfather        STOMACH  . Cancer Sister        MALE CANCER  . Cancer - Colon Sister   . Stroke Father   . Healthy Child   . Healthy Child   . Other Child        BRAIN DAMAGE AT BIRTH    Social History Social History  Substance Use Topics  . Smoking status: Former Smoker    Types: Cigarettes  . Smokeless tobacco: Never Used  . Alcohol use No     Allergies   Patient has no known allergies.   Review of Systems Review of Systems  Respiratory: Negative for shortness of breath.   Cardiovascular: Negative for chest pain.  Gastrointestinal: Positive for vomiting. Negative for diarrhea.  Musculoskeletal: Positive for arthralgias.  Neurological: Positive for dizziness.  All other systems reviewed and are negative.    Physical Exam Updated Vital Signs BP (!) 84/53   Pulse (!) 55   Temp 98.4 F (36.9 C) (Oral)   Resp (!) 23   SpO2 92%   Physical Exam  Constitutional: He appears well-developed and well-nourished.  HENT:  Head: Normocephalic.  Right Ear: External ear normal.  Left Ear: External ear normal.  Eyes: Pupils are equal, round, and reactive to light.  Neck: Normal range of motion.  Cardiovascular: An irregular rhythm present. Bradycardia present.   Pulmonary/Chest: Effort normal and breath sounds normal.  Abdominal: Soft. Bowel sounds are normal.  Musculoskeletal: He exhibits tenderness and deformity.       Legs: Neurological: He is alert.  Skin: Skin is warm and dry.  Psychiatric: He has a normal mood and affect.  Nursing note and vitals reviewed.    ED  Treatments / Results  Labs (all labs ordered are listed, but only abnormal results are displayed) Labs Reviewed  CBC WITH DIFFERENTIAL/PLATELET - Abnormal; Notable for the following:       Result Value   RBC 3.39 (*)    Hemoglobin 9.9 (*)    HCT 31.1 (*)    RDW 16.7 (*)    Platelets 121 (*)    Lymphs Abs 0.6 (*)    All other components within normal limits  I-STAT CHEM 8, ED - Abnormal; Notable for the following:  BUN 45 (*)    Creatinine, Ser 1.80 (*)    Glucose, Bld 120 (*)    Calcium, Ion 1.08 (*)    Hemoglobin 10.2 (*)    HCT 30.0 (*)    All other components within normal limits  I-STAT TROPONIN, ED - Abnormal; Notable for the following:    Troponin i, poc 0.10 (*)    All other components within normal limits  I-STAT CG4 LACTIC ACID, ED - Abnormal; Notable for the following:    Lactic Acid, Venous 2.11 (*)    All other components within normal limits  HEPATIC FUNCTION PANEL  TROPONIN I  TROPONIN I  TROPONIN I  I-STAT CG4 LACTIC ACID, ED    EKG  EKG Interpretation None       Radiology Ct Head Wo Contrast  Result Date: 06/20/2017 CLINICAL DATA:  Syncope. EXAM: CT HEAD WITHOUT CONTRAST CT CERVICAL SPINE WITHOUT CONTRAST TECHNIQUE: Multidetector CT imaging of the head and cervical spine was performed following the standard protocol without intravenous contrast. Multiplanar CT image reconstructions of the cervical spine were also generated. COMPARISON:  None. FINDINGS: CT HEAD FINDINGS Brain: Mild diffuse cortical atrophy is noted. No mass effect or midline shift is noted. Ventricular size is within normal limits. There is no evidence of mass lesion, hemorrhage or acute infarction. Vascular: Atherosclerosis of carotid siphons is noted. Skull: Normal. Negative for fracture or focal lesion. Sinuses/Orbits: No acute finding. Other: None. CT CERVICAL SPINE FINDINGS Alignment: Normal. Skull base and vertebrae: No acute fracture. No primary bone lesion or focal pathologic  process. Soft tissues and spinal canal: No prevertebral fluid or swelling. No visible canal hematoma. Disc levels: Anterior osteophyte formation is noted at C4-5, C5-6 and C6-7. Upper chest: Negative. Other: Degenerative changes seen involving posterior facet joints. IMPRESSION: Mild diffuse cortical atrophy. No acute intracranial abnormality seen. Mild degenerative changes as described above. No acute abnormality seen in the cervical spine. Electronically Signed   By: Lupita Raider, M.D.   On: 06/20/2017 22:50   Ct Cervical Spine Wo Contrast  Result Date: 06/20/2017 CLINICAL DATA:  Syncope. EXAM: CT HEAD WITHOUT CONTRAST CT CERVICAL SPINE WITHOUT CONTRAST TECHNIQUE: Multidetector CT imaging of the head and cervical spine was performed following the standard protocol without intravenous contrast. Multiplanar CT image reconstructions of the cervical spine were also generated. COMPARISON:  None. FINDINGS: CT HEAD FINDINGS Brain: Mild diffuse cortical atrophy is noted. No mass effect or midline shift is noted. Ventricular size is within normal limits. There is no evidence of mass lesion, hemorrhage or acute infarction. Vascular: Atherosclerosis of carotid siphons is noted. Skull: Normal. Negative for fracture or focal lesion. Sinuses/Orbits: No acute finding. Other: None. CT CERVICAL SPINE FINDINGS Alignment: Normal. Skull base and vertebrae: No acute fracture. No primary bone lesion or focal pathologic process. Soft tissues and spinal canal: No prevertebral fluid or swelling. No visible canal hematoma. Disc levels: Anterior osteophyte formation is noted at C4-5, C5-6 and C6-7. Upper chest: Negative. Other: Degenerative changes seen involving posterior facet joints. IMPRESSION: Mild diffuse cortical atrophy. No acute intracranial abnormality seen. Mild degenerative changes as described above. No acute abnormality seen in the cervical spine. Electronically Signed   By: Lupita Raider, M.D.   On: 06/20/2017 22:50     Dg Chest Port 1 View  Result Date: 06/20/2017 CLINICAL DATA:  Preop for hip fracture. EXAM: PORTABLE CHEST 1 VIEW COMPARISON:  Radiographs of December 10, 2015. FINDINGS: Stable cardiomediastinal silhouette with mild central pulmonary vascular congestion. Atherosclerosis  of thoracic aorta is noted. No pneumothorax or significant pleural effusion is noted. No pulmonary abnormality is noted. Old left rib fractures are noted as well as deformity of left scapula suggesting old fracture. IMPRESSION: Aortic atherosclerosis. Stable central pulmonary vascular congestion. No acute abnormality seen. Electronically Signed   By: Lupita Raider, M.D.   On: 06/20/2017 22:04   Dg Hip Unilat With Pelvis 2-3 Views Left  Result Date: 06/20/2017 CLINICAL DATA:  Pain after fall. EXAM: DG HIP (WITH OR WITHOUT PELVIS) 2-3V LEFT COMPARISON:  None. FINDINGS: There is an acute, closed, comminuted, varus angulated intertrochanteric fracture of the left femur with fractures undermining the greater trochanter and with slight avulsion of the lesser trochanter. The femoral head is seated within its acetabular component. The bony pelvis appears intact. There is mild lumbar degenerative disc disease of the included lumbar spine. Fine bony detail is limited by patient body habitus. Right femur and hip appear intact. No disruption of the sacroiliac joints and pubic symphysis. The arcuate lines of the sacrum appear intact. IMPRESSION: 1. There is an acute, closed, comminuted, varus angulated intertrochanteric fracture of the left femur with fractures undermining the greater trochanter and with slight avulsion of the lesser trochanter. 2. Mild lumbar spondylosis. 3. Intact bony pelvis without diastasis. Electronically Signed   By: Tollie Eth M.D.   On: 06/20/2017 22:06    Procedures Procedures (including critical care time)  Medications Ordered in ED Medications  sodium chloride 0.9 % bolus 1,000 mL (0 mLs Intravenous Stopped  06/20/17 2326)  fentaNYL (SUBLIMAZE) injection 50 mcg (50 mcg Intravenous Given 06/20/17 2102)  fentaNYL (SUBLIMAZE) injection 50 mcg (50 mcg Intravenous Given 06/21/17 0040)     Initial Impression / Assessment and Plan / ED Course  I have reviewed the triage vital signs and the nursing notes.  Pertinent labs & imaging results that were available during my care of the patient were reviewed by me and considered in my medical decision making (see chart for details).    Spoke with Dr. Jacinto Halim, patient's cardiologist reviewed.  Elevated troponin, and patient's symptoms.  He states that he had cardiac cath on 04/18/2017 with no significant pathology.  He is aware of the troponin of 0.10, but in this patient.  It is not of concern due to his chronic diastolic heart failure DR. Eulah Pont will take the patient to the OR in the Morning Patient is to be NPO after midnight   Final Clinical Impressions(s) / ED Diagnoses   Final diagnoses:  Closed fracture of left hip, initial encounter (HCC)  Hypotension, unspecified hypotension type  Syncope, unspecified syncope type  Bradycardia    New Prescriptions New Prescriptions   No medications on file     Earley Favor, NP 06/20/17 2251    Earley Favor, NP 06/20/17 2340    Earley Favor, NP 06/21/17 0134    Loren Racer, MD 06/23/17 [phone]

## 2017-06-20 NOTE — ED Notes (Signed)
Pt to CT at this time.  Pt's granddaughter at bedside.

## 2017-06-20 NOTE — ED Notes (Signed)
Pt to CT at this time.

## 2017-06-20 NOTE — ED Notes (Signed)
EMS also reports pt vomited prior to arrival to ED.  Zofran 4mg  given for same

## 2017-06-20 NOTE — ED Triage Notes (Signed)
Pt to ED via [facility] after reported having a syncopal episode.  Pt st's he was walking to his neighbors house to take some peaches when he had a syncopal episode.  Pt fell on cement.  Pt st's he hit his head and has pain in left hip.  Pt alert and oriented x's 3 on arrival to ED

## 2017-06-20 NOTE — ED Notes (Signed)
Pt placed on pacer pads at this time just to monitor.

## 2017-06-21 ENCOUNTER — Inpatient Hospital Stay (HOSPITAL_COMMUNITY): Payer: Medicare Other | Admitting: Anesthesiology

## 2017-06-21 ENCOUNTER — Encounter (HOSPITAL_COMMUNITY): Payer: Self-pay | Admitting: Certified Registered"

## 2017-06-21 ENCOUNTER — Inpatient Hospital Stay (HOSPITAL_COMMUNITY): Payer: Medicare Other

## 2017-06-21 ENCOUNTER — Encounter (HOSPITAL_COMMUNITY)
Admission: EM | Disposition: A | Discharge status: Skilled Nursing Facility | Payer: Self-pay | Source: Home / Self Care | Attending: Internal Medicine

## 2017-06-21 DIAGNOSIS — I959 Hypotension, unspecified: Secondary | ICD-10-CM | POA: Diagnosis not present

## 2017-06-21 DIAGNOSIS — N400 Enlarged prostate without lower urinary tract symptoms: Secondary | ICD-10-CM | POA: Diagnosis present

## 2017-06-21 DIAGNOSIS — H409 Unspecified glaucoma: Secondary | ICD-10-CM | POA: Diagnosis present

## 2017-06-21 DIAGNOSIS — K5900 Constipation, unspecified: Secondary | ICD-10-CM | POA: Diagnosis not present

## 2017-06-21 DIAGNOSIS — I2510 Atherosclerotic heart disease of native coronary artery without angina pectoris: Secondary | ICD-10-CM | POA: Diagnosis present

## 2017-06-21 DIAGNOSIS — I4891 Unspecified atrial fibrillation: Secondary | ICD-10-CM | POA: Diagnosis present

## 2017-06-21 DIAGNOSIS — I5043 Acute on chronic combined systolic (congestive) and diastolic (congestive) heart failure: Secondary | ICD-10-CM | POA: Diagnosis present

## 2017-06-21 DIAGNOSIS — Z23 Encounter for immunization: Secondary | ICD-10-CM | POA: Diagnosis not present

## 2017-06-21 DIAGNOSIS — I10 Essential (primary) hypertension: Secondary | ICD-10-CM

## 2017-06-21 DIAGNOSIS — D649 Anemia, unspecified: Secondary | ICD-10-CM

## 2017-06-21 DIAGNOSIS — W19XXXA Unspecified fall, initial encounter: Secondary | ICD-10-CM | POA: Diagnosis not present

## 2017-06-21 DIAGNOSIS — R262 Difficulty in walking, not elsewhere classified: Secondary | ICD-10-CM | POA: Diagnosis not present

## 2017-06-21 DIAGNOSIS — N184 Chronic kidney disease, stage 4 (severe): Secondary | ICD-10-CM | POA: Diagnosis present

## 2017-06-21 DIAGNOSIS — I5033 Acute on chronic diastolic (congestive) heart failure: Secondary | ICD-10-CM | POA: Diagnosis not present

## 2017-06-21 DIAGNOSIS — E1165 Type 2 diabetes mellitus with hyperglycemia: Secondary | ICD-10-CM | POA: Diagnosis not present

## 2017-06-21 DIAGNOSIS — S72002S Fracture of unspecified part of neck of left femur, sequela: Secondary | ICD-10-CM | POA: Diagnosis not present

## 2017-06-21 DIAGNOSIS — S72102A Unspecified trochanteric fracture of left femur, initial encounter for closed fracture: Secondary | ICD-10-CM | POA: Diagnosis not present

## 2017-06-21 DIAGNOSIS — R579 Shock, unspecified: Secondary | ICD-10-CM

## 2017-06-21 DIAGNOSIS — S72112A Displaced fracture of greater trochanter of left femur, initial encounter for closed fracture: Secondary | ICD-10-CM | POA: Diagnosis not present

## 2017-06-21 DIAGNOSIS — N183 Chronic kidney disease, stage 3 (moderate): Secondary | ICD-10-CM | POA: Diagnosis not present

## 2017-06-21 DIAGNOSIS — S7292XS Unspecified fracture of left femur, sequela: Secondary | ICD-10-CM | POA: Diagnosis not present

## 2017-06-21 DIAGNOSIS — R55 Syncope and collapse: Secondary | ICD-10-CM

## 2017-06-21 DIAGNOSIS — E1122 Type 2 diabetes mellitus with diabetic chronic kidney disease: Secondary | ICD-10-CM | POA: Diagnosis not present

## 2017-06-21 DIAGNOSIS — K219 Gastro-esophageal reflux disease without esophagitis: Secondary | ICD-10-CM

## 2017-06-21 DIAGNOSIS — I495 Sick sinus syndrome: Secondary | ICD-10-CM | POA: Diagnosis present

## 2017-06-21 DIAGNOSIS — E1129 Type 2 diabetes mellitus with other diabetic kidney complication: Secondary | ICD-10-CM | POA: Diagnosis not present

## 2017-06-21 DIAGNOSIS — E119 Type 2 diabetes mellitus without complications: Secondary | ICD-10-CM | POA: Diagnosis not present

## 2017-06-21 DIAGNOSIS — Z87891 Personal history of nicotine dependence: Secondary | ICD-10-CM | POA: Diagnosis not present

## 2017-06-21 DIAGNOSIS — I071 Rheumatic tricuspid insufficiency: Secondary | ICD-10-CM | POA: Diagnosis present

## 2017-06-21 DIAGNOSIS — I130 Hypertensive heart and chronic kidney disease with heart failure and stage 1 through stage 4 chronic kidney disease, or unspecified chronic kidney disease: Secondary | ICD-10-CM | POA: Diagnosis present

## 2017-06-21 DIAGNOSIS — S72142D Displaced intertrochanteric fracture of left femur, subsequent encounter for closed fracture with routine healing: Secondary | ICD-10-CM | POA: Diagnosis not present

## 2017-06-21 DIAGNOSIS — Z01818 Encounter for other preprocedural examination: Secondary | ICD-10-CM | POA: Diagnosis not present

## 2017-06-21 DIAGNOSIS — R7989 Other specified abnormal findings of blood chemistry: Secondary | ICD-10-CM

## 2017-06-21 DIAGNOSIS — R0689 Other abnormalities of breathing: Secondary | ICD-10-CM | POA: Diagnosis not present

## 2017-06-21 DIAGNOSIS — S72142A Displaced intertrochanteric fracture of left femur, initial encounter for closed fracture: Secondary | ICD-10-CM | POA: Diagnosis not present

## 2017-06-21 DIAGNOSIS — E785 Hyperlipidemia, unspecified: Secondary | ICD-10-CM

## 2017-06-21 DIAGNOSIS — S72002A Fracture of unspecified part of neck of left femur, initial encounter for closed fracture: Secondary | ICD-10-CM | POA: Diagnosis present

## 2017-06-21 DIAGNOSIS — W1830XA Fall on same level, unspecified, initial encounter: Secondary | ICD-10-CM | POA: Diagnosis present

## 2017-06-21 DIAGNOSIS — D509 Iron deficiency anemia, unspecified: Secondary | ICD-10-CM | POA: Diagnosis not present

## 2017-06-21 DIAGNOSIS — E861 Hypovolemia: Secondary | ICD-10-CM | POA: Diagnosis not present

## 2017-06-21 DIAGNOSIS — H42 Glaucoma in diseases classified elsewhere: Secondary | ICD-10-CM | POA: Diagnosis not present

## 2017-06-21 DIAGNOSIS — Z6832 Body mass index (BMI) 32.0-32.9, adult: Secondary | ICD-10-CM | POA: Diagnosis not present

## 2017-06-21 DIAGNOSIS — R778 Other specified abnormalities of plasma proteins: Secondary | ICD-10-CM | POA: Diagnosis present

## 2017-06-21 DIAGNOSIS — Z111 Encounter for screening for respiratory tuberculosis: Secondary | ICD-10-CM | POA: Diagnosis not present

## 2017-06-21 DIAGNOSIS — J80 Acute respiratory distress syndrome: Secondary | ICD-10-CM | POA: Diagnosis not present

## 2017-06-21 DIAGNOSIS — I209 Angina pectoris, unspecified: Secondary | ICD-10-CM | POA: Diagnosis not present

## 2017-06-21 DIAGNOSIS — S7292XD Unspecified fracture of left femur, subsequent encounter for closed fracture with routine healing: Secondary | ICD-10-CM | POA: Diagnosis not present

## 2017-06-21 DIAGNOSIS — I951 Orthostatic hypotension: Secondary | ICD-10-CM | POA: Diagnosis present

## 2017-06-21 DIAGNOSIS — R748 Abnormal levels of other serum enzymes: Secondary | ICD-10-CM | POA: Diagnosis not present

## 2017-06-21 DIAGNOSIS — Z4789 Encounter for other orthopedic aftercare: Secondary | ICD-10-CM | POA: Diagnosis not present

## 2017-06-21 DIAGNOSIS — G8911 Acute pain due to trauma: Secondary | ICD-10-CM | POA: Diagnosis not present

## 2017-06-21 DIAGNOSIS — R278 Other lack of coordination: Secondary | ICD-10-CM | POA: Diagnosis not present

## 2017-06-21 DIAGNOSIS — I350 Nonrheumatic aortic (valve) stenosis: Secondary | ICD-10-CM | POA: Diagnosis present

## 2017-06-21 DIAGNOSIS — R001 Bradycardia, unspecified: Secondary | ICD-10-CM | POA: Diagnosis not present

## 2017-06-21 DIAGNOSIS — N3289 Other specified disorders of bladder: Secondary | ICD-10-CM | POA: Diagnosis not present

## 2017-06-21 DIAGNOSIS — S72009A Fracture of unspecified part of neck of unspecified femur, initial encounter for closed fracture: Secondary | ICD-10-CM | POA: Diagnosis not present

## 2017-06-21 DIAGNOSIS — I482 Chronic atrial fibrillation: Secondary | ICD-10-CM | POA: Diagnosis not present

## 2017-06-21 DIAGNOSIS — Z96652 Presence of left artificial knee joint: Secondary | ICD-10-CM | POA: Diagnosis present

## 2017-06-21 DIAGNOSIS — N401 Enlarged prostate with lower urinary tract symptoms: Secondary | ICD-10-CM | POA: Diagnosis not present

## 2017-06-21 DIAGNOSIS — K222 Esophageal obstruction: Secondary | ICD-10-CM | POA: Diagnosis not present

## 2017-06-21 DIAGNOSIS — E669 Obesity, unspecified: Secondary | ICD-10-CM | POA: Diagnosis present

## 2017-06-21 DIAGNOSIS — J9811 Atelectasis: Secondary | ICD-10-CM | POA: Diagnosis not present

## 2017-06-21 DIAGNOSIS — E569 Vitamin deficiency, unspecified: Secondary | ICD-10-CM | POA: Diagnosis not present

## 2017-06-21 DIAGNOSIS — R0989 Other specified symptoms and signs involving the circulatory and respiratory systems: Secondary | ICD-10-CM | POA: Diagnosis not present

## 2017-06-21 DIAGNOSIS — M6281 Muscle weakness (generalized): Secondary | ICD-10-CM | POA: Diagnosis not present

## 2017-06-21 DIAGNOSIS — M109 Gout, unspecified: Secondary | ICD-10-CM | POA: Diagnosis not present

## 2017-06-21 HISTORY — PX: INTRAMEDULLARY (IM) NAIL INTERTROCHANTERIC: SHX5875

## 2017-06-21 LAB — BASIC METABOLIC PANEL
ANION GAP: 13 (ref 5–15)
BUN: 56 mg/dL — ABNORMAL HIGH (ref 6–20)
CHLORIDE: 106 mmol/L (ref 101–111)
CO2: 23 mmol/L (ref 22–32)
Calcium: 8.5 mg/dL — ABNORMAL LOW (ref 8.9–10.3)
Creatinine, Ser: 2.27 mg/dL — ABNORMAL HIGH (ref 0.61–1.24)
GFR calc Af Amer: 28 mL/min — ABNORMAL LOW (ref >60)
GFR, EST NON AFRICAN AMERICAN: 24 mL/min — AB (ref >60)
GLUCOSE: 187 mg/dL — AB (ref 65–99)
POTASSIUM: 4.4 mmol/L (ref 3.5–5.1)
Sodium: 142 mmol/L (ref 135–145)

## 2017-06-21 LAB — GLUCOSE, CAPILLARY
GLUCOSE-CAPILLARY: 186 mg/dL — AB (ref 65–99)
Glucose-Capillary: 149 mg/dL — ABNORMAL HIGH (ref 65–99)
Glucose-Capillary: 140 mg/dL — ABNORMAL HIGH (ref 65–99)
Glucose-Capillary: 159 mg/dL — ABNORMAL HIGH (ref 65–99)
Glucose-Capillary: 201 mg/dL — ABNORMAL HIGH (ref 65–99)

## 2017-06-21 LAB — IRON AND TIBC
IRON: 27 ug/dL — AB (ref 45–182)
SATURATION RATIOS: 9 % — AB (ref 17.9–39.5)
TIBC: 295 ug/dL (ref 250–450)
UIBC: 268 ug/dL

## 2017-06-21 LAB — HEPATIC FUNCTION PANEL
ALK PHOS: 75 U/L (ref 38–126)
ALT: 16 U/L — AB (ref 17–63)
AST: 26 U/L (ref 15–41)
Albumin: 3 g/dL — ABNORMAL LOW (ref 3.5–5.0)
BILIRUBIN DIRECT: 0.7 mg/dL — AB (ref 0.1–0.5)
BILIRUBIN INDIRECT: 1 mg/dL — AB (ref 0.3–0.9)
Total Bilirubin: 1.7 mg/dL — ABNORMAL HIGH (ref 0.3–1.2)
Total Protein: 6.1 g/dL — ABNORMAL LOW (ref 6.5–8.1)

## 2017-06-21 LAB — CBC WITH DIFFERENTIAL/PLATELET
BASOS ABS: 0 10*3/uL (ref 0.0–0.1)
Basophils Relative: 0 %
Eosinophils Absolute: 0 10*3/uL (ref 0.0–0.7)
Eosinophils Relative: 0 %
HEMATOCRIT: 34.9 % — AB (ref 39.0–52.0)
Hemoglobin: 10.9 g/dL — ABNORMAL LOW (ref 13.0–17.0)
LYMPHS PCT: 4 %
Lymphs Abs: 0.4 10*3/uL — ABNORMAL LOW (ref 0.7–4.0)
MCH: 28.8 pg (ref 26.0–34.0)
MCHC: 31.2 g/dL (ref 30.0–36.0)
MCV: 92.3 fL (ref 78.0–100.0)
Monocytes Absolute: 0.5 10*3/uL (ref 0.1–1.0)
Monocytes Relative: 5 %
NEUTROS ABS: 8.1 10*3/uL — AB (ref 1.7–7.7)
Neutrophils Relative %: 91 %
PLATELETS: 144 10*3/uL — AB (ref 150–400)
RBC: 3.78 MIL/uL — AB (ref 4.22–5.81)
RDW: 16.9 % — ABNORMAL HIGH (ref 11.5–15.5)
WBC: 9 10*3/uL (ref 4.0–10.5)

## 2017-06-21 LAB — CORTISOL: Cortisol, Plasma: 12.8 ug/dL

## 2017-06-21 LAB — TROPONIN I: Troponin I: 0.11 ng/mL (ref <0.03)

## 2017-06-21 LAB — MRSA PCR SCREENING: MRSA BY PCR: NEGATIVE

## 2017-06-21 LAB — RETICULOCYTES
RBC.: 3.78 MIL/uL — ABNORMAL LOW (ref 4.22–5.81)
Retic Count, Absolute: 34 10*3/uL (ref 19.0–186.0)
Retic Ct Pct: 0.9 % (ref 0.4–3.1)

## 2017-06-21 LAB — VITAMIN B12: Vitamin B-12: 501 pg/mL (ref 180–914)

## 2017-06-21 LAB — FERRITIN: FERRITIN: 130 ng/mL (ref 24–336)

## 2017-06-21 LAB — FOLATE: FOLATE: 20 ng/mL (ref >5.9)

## 2017-06-21 SURGERY — FIXATION, FRACTURE, INTERTROCHANTERIC, WITH INTRAMEDULLARY ROD
Anesthesia: General | Site: Leg Upper | Laterality: Left

## 2017-06-21 MED ORDER — ROCURONIUM BROMIDE 10 MG/ML (PF) SYRINGE
PREFILLED_SYRINGE | INTRAVENOUS | Status: AC
  Filled 2017-06-21: qty 5

## 2017-06-21 MED ORDER — ALBUMIN HUMAN 5 % IV SOLN
INTRAVENOUS | Status: DC | PRN
  Administered 2017-06-21: 08:00:00 via INTRAVENOUS

## 2017-06-21 MED ORDER — LACTATED RINGERS IV SOLN
INTRAVENOUS | Status: DC
  Administered 2017-06-21: 09:00:00 via INTRAVENOUS

## 2017-06-21 MED ORDER — ROCURONIUM BROMIDE 10 MG/ML (PF) SYRINGE
PREFILLED_SYRINGE | INTRAVENOUS | Status: DC | PRN
  Administered 2017-06-21: 25 mg via INTRAVENOUS

## 2017-06-21 MED ORDER — ONDANSETRON HCL 4 MG/2ML IJ SOLN
INTRAMUSCULAR | Status: DC | PRN
  Administered 2017-06-21: 4 mg via INTRAVENOUS

## 2017-06-21 MED ORDER — FENTANYL CITRATE (PF) 100 MCG/2ML IJ SOLN
INTRAMUSCULAR | Status: AC
  Filled 2017-06-21: qty 2

## 2017-06-21 MED ORDER — ACETAMINOPHEN 500 MG PO TABS: 1000.0000 mg | ORAL_TABLET | Freq: Once | ORAL | Status: DC

## 2017-06-21 MED ORDER — FENTANYL CITRATE (PF) 100 MCG/2ML IJ SOLN: 12.5000 ug | INTRAMUSCULAR | Status: DC | PRN

## 2017-06-21 MED ORDER — DEXAMETHASONE SODIUM PHOSPHATE 10 MG/ML IJ SOLN
INTRAMUSCULAR | Status: AC
  Filled 2017-06-21: qty 1

## 2017-06-21 MED ORDER — SUCCINYLCHOLINE CHLORIDE 200 MG/10ML IV SOSY
PREFILLED_SYRINGE | INTRAVENOUS | Status: AC
  Filled 2017-06-21: qty 10

## 2017-06-21 MED ORDER — ETOMIDATE 2 MG/ML IV SOLN
INTRAVENOUS | Status: DC | PRN
  Administered 2017-06-21: 16 mg via INTRAVENOUS

## 2017-06-21 MED ORDER — PHENYLEPHRINE HCL 10 MG/ML IJ SOLN
INTRAVENOUS | Status: DC | PRN
  Administered 2017-06-21: 50 ug/min via INTRAVENOUS

## 2017-06-21 MED ORDER — LACTATED RINGERS IV SOLN
INTRAVENOUS | Status: DC
  Administered 2017-06-21 (×2): via INTRAVENOUS

## 2017-06-21 MED ORDER — HYDROCODONE-ACETAMINOPHEN 5-325 MG PO TABS: 1.0000 | ORAL_TABLET | Freq: Four times a day (QID) | ORAL | 0 refills | Status: DC | PRN

## 2017-06-21 MED ORDER — GLYCOPYRROLATE 0.2 MG/ML IJ SOLN
INTRAMUSCULAR | Status: DC | PRN
  Administered 2017-06-21: 0.4 mg via INTRAVENOUS

## 2017-06-21 MED ORDER — EPHEDRINE 5 MG/ML INJ
INTRAVENOUS | Status: AC
  Filled 2017-06-21: qty 10

## 2017-06-21 MED ORDER — SODIUM CHLORIDE 0.9 % IV BOLUS (SEPSIS)
500.0000 mL | Freq: Once | INTRAVENOUS | Status: AC
  Administered 2017-06-21: 500 mL via INTRAVENOUS

## 2017-06-21 MED ORDER — ONDANSETRON HCL 4 MG/2ML IJ SOLN
INTRAMUSCULAR | Status: AC
  Filled 2017-06-21: qty 2

## 2017-06-21 MED ORDER — FENTANYL CITRATE (PF) 250 MCG/5ML IJ SOLN
INTRAMUSCULAR | Status: AC
  Filled 2017-06-21: qty 5

## 2017-06-21 MED ORDER — ATORVASTATIN CALCIUM 40 MG PO TABS
40.0000 mg | ORAL_TABLET | Freq: Every day | ORAL | Status: DC
  Administered 2017-06-21 – 2017-06-27 (×7): 40 mg via ORAL
  Filled 2017-06-21 (×7): qty 1

## 2017-06-21 MED ORDER — INSULIN ASPART 100 UNIT/ML ~~LOC~~ SOLN
2.0000 [IU] | SUBCUTANEOUS | Status: DC
  Administered 2017-06-21: 6 [IU] via SUBCUTANEOUS
  Administered 2017-06-21 – 2017-06-22 (×3): 4 [IU] via SUBCUTANEOUS

## 2017-06-21 MED ORDER — BRINZOLAMIDE 1 % OP SUSP
1.0000 [drp] | Freq: Three times a day (TID) | OPHTHALMIC | Status: DC
  Administered 2017-06-21 – 2017-06-28 (×20): 1 [drp] via OPHTHALMIC
  Filled 2017-06-21 (×3): qty 10

## 2017-06-21 MED ORDER — FINASTERIDE 5 MG PO TABS
5.0000 mg | ORAL_TABLET | Freq: Every day | ORAL | Status: DC
  Administered 2017-06-22 – 2017-06-28 (×7): 5 mg via ORAL
  Filled 2017-06-21 (×7): qty 1

## 2017-06-21 MED ORDER — PROPOFOL 10 MG/ML IV BOLUS
INTRAVENOUS | Status: AC
  Filled 2017-06-21: qty 20

## 2017-06-21 MED ORDER — INSULIN ASPART 100 UNIT/ML ~~LOC~~ SOLN
0.0000 [IU] | SUBCUTANEOUS | Status: DC
  Administered 2017-06-21: 1 [IU] via SUBCUTANEOUS

## 2017-06-21 MED ORDER — LIDOCAINE 2% (20 MG/ML) 5 ML SYRINGE
INTRAMUSCULAR | Status: AC
  Filled 2017-06-21: qty 5

## 2017-06-21 MED ORDER — SODIUM CHLORIDE 0.9 % IV BOLUS (SEPSIS): 500.0000 mL | Freq: Once | INTRAVENOUS | Status: DC

## 2017-06-21 MED ORDER — TAMSULOSIN HCL 0.4 MG PO CAPS
0.4000 mg | ORAL_CAPSULE | Freq: Every day | ORAL | Status: DC
  Administered 2017-06-22 – 2017-06-27 (×6): 0.4 mg via ORAL
  Filled 2017-06-21 (×6): qty 1

## 2017-06-21 MED ORDER — ALFUZOSIN HCL ER 10 MG PO TB24: 10.0000 mg | ORAL_TABLET | Freq: Every day | ORAL | Status: DC

## 2017-06-21 MED ORDER — ONDANSETRON HCL 4 MG/2ML IJ SOLN
4.0000 mg | Freq: Four times a day (QID) | INTRAMUSCULAR | Status: DC | PRN
  Administered 2017-06-21 – 2017-06-24 (×3): 4 mg via INTRAVENOUS
  Filled 2017-06-21 (×3): qty 2

## 2017-06-21 MED ORDER — CEFAZOLIN SODIUM-DEXTROSE 2-4 GM/100ML-% IV SOLN
2.0000 g | INTRAVENOUS | Status: AC
  Administered 2017-06-21: 2 g via INTRAVENOUS
  Filled 2017-06-21: qty 100

## 2017-06-21 MED ORDER — CHLORHEXIDINE GLUCONATE 4 % EX LIQD: 60.0000 mL | Freq: Once | CUTANEOUS | Status: DC

## 2017-06-21 MED ORDER — SUCCINYLCHOLINE CHLORIDE 200 MG/10ML IV SOSY
PREFILLED_SYRINGE | INTRAVENOUS | Status: DC | PRN
  Administered 2017-06-21: 100 mg via INTRAVENOUS

## 2017-06-21 MED ORDER — MORPHINE SULFATE (PF) 4 MG/ML IV SOLN: 0.5000 mg | INTRAVENOUS | Status: DC | PRN

## 2017-06-21 MED ORDER — MIRABEGRON ER 50 MG PO TB24
50.0000 mg | ORAL_TABLET | Freq: Every day | ORAL | Status: DC
  Filled 2017-06-21: qty 1

## 2017-06-21 MED ORDER — FENTANYL CITRATE (PF) 100 MCG/2ML IJ SOLN
25.0000 ug | INTRAMUSCULAR | Status: DC | PRN
Start: 2017-06-21 — End: 2017-06-21
  Administered 2017-06-21: 25 ug via INTRAVENOUS

## 2017-06-21 MED ORDER — TRAVOPROST (BAK FREE) 0.004 % OP SOLN
1.0000 [drp] | Freq: Every day | OPHTHALMIC | Status: DC
  Administered 2017-06-21 – 2017-06-27 (×7): 1 [drp] via OPHTHALMIC
  Filled 2017-06-21 (×2): qty 2.5

## 2017-06-21 MED ORDER — PHENYLEPHRINE 40 MCG/ML (10ML) SYRINGE FOR IV PUSH (FOR BLOOD PRESSURE SUPPORT)
PREFILLED_SYRINGE | INTRAVENOUS | Status: AC
  Filled 2017-06-21: qty 10

## 2017-06-21 MED ORDER — FENTANYL CITRATE (PF) 100 MCG/2ML IJ SOLN
INTRAMUSCULAR | Status: DC | PRN
  Administered 2017-06-21: 100 ug via INTRAVENOUS

## 2017-06-21 MED ORDER — FENTANYL CITRATE (PF) 100 MCG/2ML IJ SOLN
50.0000 ug | Freq: Once | INTRAMUSCULAR | Status: AC
  Administered 2017-06-21: 50 ug via INTRAVENOUS
  Filled 2017-06-21: qty 2

## 2017-06-21 MED ORDER — SODIUM CHLORIDE 0.9 % IV SOLN
0.0000 ug/min | INTRAVENOUS | Status: DC
  Administered 2017-06-21: 35 ug/min via INTRAVENOUS
  Filled 2017-06-21 (×2): qty 1

## 2017-06-21 MED ORDER — SODIUM CHLORIDE 0.9 % IV SOLN: INTRAVENOUS | Status: DC

## 2017-06-21 MED ORDER — ALLOPURINOL 100 MG PO TABS
100.0000 mg | ORAL_TABLET | Freq: Every day | ORAL | Status: DC
  Administered 2017-06-22 – 2017-06-28 (×7): 100 mg via ORAL
  Filled 2017-06-21 (×7): qty 1

## 2017-06-21 MED ORDER — SUGAMMADEX SODIUM 200 MG/2ML IV SOLN
INTRAVENOUS | Status: DC | PRN
  Administered 2017-06-21: 100 mg via INTRAVENOUS

## 2017-06-21 MED ORDER — SUGAMMADEX SODIUM 200 MG/2ML IV SOLN
INTRAVENOUS | Status: AC
  Filled 2017-06-21: qty 2

## 2017-06-21 MED ORDER — SODIUM CHLORIDE 0.9 % IV SOLN
0.0000 ug/min | INTRAVENOUS | Status: DC
  Administered 2017-06-21: 60 ug/min via INTRAVENOUS
  Administered 2017-06-21: 40 ug/min via INTRAVENOUS
  Filled 2017-06-21: qty 1

## 2017-06-21 MED ORDER — EPHEDRINE SULFATE-NACL 50-0.9 MG/10ML-% IV SOSY
PREFILLED_SYRINGE | INTRAVENOUS | Status: DC | PRN
Start: 2017-06-21 — End: 2017-06-21
  Administered 2017-06-21: 15 mg via INTRAVENOUS
  Administered 2017-06-21 (×2): 10 mg via INTRAVENOUS

## 2017-06-21 MED ORDER — CEFAZOLIN SODIUM-DEXTROSE 1-4 GM/50ML-% IV SOLN
1.0000 g | Freq: Four times a day (QID) | INTRAVENOUS | Status: AC
  Administered 2017-06-21 – 2017-06-22 (×3): 1 g via INTRAVENOUS
  Filled 2017-06-21 (×3): qty 50

## 2017-06-21 MED ORDER — HYDROCODONE-ACETAMINOPHEN 5-325 MG PO TABS
1.0000 | ORAL_TABLET | Freq: Four times a day (QID) | ORAL | Status: DC | PRN
  Administered 2017-06-21: 2 via ORAL
  Administered 2017-06-22 – 2017-06-26 (×6): 1 via ORAL
  Administered 2017-06-27: 2 via ORAL
  Administered 2017-06-27: 1 via ORAL
  Administered 2017-06-27: 2 via ORAL
  Administered 2017-06-28: 1 via ORAL
  Filled 2017-06-21 (×2): qty 1
  Filled 2017-06-21: qty 2
  Filled 2017-06-21 (×4): qty 1
  Filled 2017-06-21 (×2): qty 2
  Filled 2017-06-21 (×3): qty 1

## 2017-06-21 MED ORDER — DEXAMETHASONE SODIUM PHOSPHATE 10 MG/ML IJ SOLN
INTRAMUSCULAR | Status: DC | PRN
  Administered 2017-06-21: 5 mg via INTRAVENOUS

## 2017-06-21 MED ORDER — SODIUM CHLORIDE 0.9 % IV SOLN
INTRAVENOUS | Status: DC | PRN
  Administered 2017-06-21: 07:00:00 via INTRAVENOUS

## 2017-06-21 MED ORDER — LIDOCAINE 2% (20 MG/ML) 5 ML SYRINGE
INTRAMUSCULAR | Status: DC | PRN
  Administered 2017-06-21: 80 mg via INTRAVENOUS

## 2017-06-21 MED ORDER — 0.9 % SODIUM CHLORIDE (POUR BTL) OPTIME
TOPICAL | Status: DC | PRN
  Administered 2017-06-21: 1000 mL

## 2017-06-21 MED ORDER — APIXABAN 2.5 MG PO TABS: 2.5000 mg | ORAL_TABLET | Freq: Two times a day (BID) | ORAL | Status: DC

## 2017-06-21 MED ORDER — PHENYLEPHRINE 40 MCG/ML (10ML) SYRINGE FOR IV PUSH (FOR BLOOD PRESSURE SUPPORT)
PREFILLED_SYRINGE | INTRAVENOUS | Status: DC | PRN
Start: 2017-06-21 — End: 2017-06-21
  Administered 2017-06-21: 80 ug via INTRAVENOUS
  Administered 2017-06-21 (×2): 120 ug via INTRAVENOUS

## 2017-06-21 SURGICAL SUPPLY — 38 items
BIT DRILL AO GAMMA 4.2X180 (BIT) ×2 IMPLANT
BNDG COHESIVE 4X5 TAN STRL (GAUZE/BANDAGES/DRESSINGS) ×1 IMPLANT
BNDG GAUZE ELAST 4 BULKY (GAUZE/BANDAGES/DRESSINGS) ×3 IMPLANT
COVER MAYO STAND STRL (DRAPES) ×2 IMPLANT
COVER PERINEAL POST (MISCELLANEOUS) ×3 IMPLANT
COVER SURGICAL LIGHT HANDLE (MISCELLANEOUS) ×3 IMPLANT
DRAPE STERI IOBAN 125X83 (DRAPES) ×3 IMPLANT
DRSG MEPILEX BORDER 4X4 (GAUZE/BANDAGES/DRESSINGS) ×8 IMPLANT
DURAPREP 26ML APPLICATOR (WOUND CARE) ×3 IMPLANT
ELECT REM PT RETURN 9FT ADLT (ELECTROSURGICAL) ×3
ELECTRODE REM PT RTRN 9FT ADLT (ELECTROSURGICAL) ×1 IMPLANT
GLOVE BIO SURGEON STRL SZ7.5 (GLOVE) ×6 IMPLANT
GLOVE BIOGEL PI IND STRL 8 (GLOVE) ×2 IMPLANT
GLOVE BIOGEL PI INDICATOR 8 (GLOVE) ×4
GOWN STRL REUS W/ TWL LRG LVL3 (GOWN DISPOSABLE) ×3 IMPLANT
GOWN STRL REUS W/TWL LRG LVL3 (GOWN DISPOSABLE) ×9
GUIDEROD T2 3X1000 (ROD) ×2 IMPLANT
K-WIRE  3.2X450M STR (WIRE) ×2
K-WIRE 3.2X450M STR (WIRE) ×1
KIT ROOM TURNOVER OR (KITS) ×3 IMPLANT
KWIRE 3.2X450M STR (WIRE) IMPLANT
MANIFOLD NEPTUNE II (INSTRUMENTS) ×3 IMPLANT
NAIL GAMMA 10X420X125 (Nail) ×2 IMPLANT
NS IRRIG 1000ML POUR BTL (IV SOLUTION) ×3 IMPLANT
PACK GENERAL/GYN (CUSTOM PROCEDURE TRAY) ×3 IMPLANT
PAD ARMBOARD 7.5X6 YLW CONV (MISCELLANEOUS) ×3 IMPLANT
PAD CAST 4YDX4 CTTN HI CHSV (CAST SUPPLIES) ×1 IMPLANT
PADDING CAST COTTON 4X4 STRL (CAST SUPPLIES) ×3
REAMER SHAFT BIXCUT (INSTRUMENTS) ×2 IMPLANT
SCREW LAG GAMMA 3 TI 10.5X100M (Screw) ×2 IMPLANT
SCREW LOCKING FULL THREAD 5X52 (Screw) ×2 IMPLANT
SUT MNCRL AB 4-0 PS2 18 (SUTURE) IMPLANT
SUT MON AB 2-0 CT1 27 (SUTURE) ×3 IMPLANT
SUT MON AB 2-0 CT1 36 (SUTURE) ×2 IMPLANT
SUT VIC AB 0 CT1 27 (SUTURE) ×3
SUT VIC AB 0 CT1 27XBRD ANBCTR (SUTURE) ×1 IMPLANT
TOWEL OR 17X24 6PK STRL BLUE (TOWEL DISPOSABLE) ×3 IMPLANT
TOWEL OR 17X26 10 PK STRL BLUE (TOWEL DISPOSABLE) ×3 IMPLANT

## 2017-06-21 NOTE — Transfer of Care (Signed)
Immediate Anesthesia Transfer of Care Note  Patient: [name] [name]  Procedure(s) Performed: Procedure(s): INTRAMEDULLARY (IM) NAIL INTERTROCHANTRIC Left Leg (Left)  Patient Location: PACU  Anesthesia Type:General  Level of Consciousness: drowsy and patient cooperative  Airway & Oxygen Therapy: Patient Spontanous Breathing and Patient connected to face mask oxygen  Post-op Assessment: Report given to RN, Post -op Vital signs reviewed and stable and Patient moving all extremities  Post vital signs: Reviewed and stable  Last Vitals:  Vitals:   06/21/17 0401 06/21/17 0920  BP: (!) 83/54 97/60  Pulse: (!) 51 66  Resp: 16 20  Temp: (!) 36.4 C   SpO2: 93% 94%    Last Pain:  Vitals:   06/21/17 0401  TempSrc: Oral  PainSc:          Complications: No apparent anesthesia complications

## 2017-06-21 NOTE — Anesthesia Procedure Notes (Signed)
Procedure Name: Intubation Date/Time: 06/21/2017 8:41 AM Performed by: Melina Copa, Yahira Timberman R Pre-anesthesia Checklist: Patient identified, Emergency Drugs available, Suction available and Patient being monitored Patient Re-evaluated:Patient Re-evaluated prior to induction Oxygen Delivery Method: Circle System Utilized Preoxygenation: Pre-oxygenation with 100% oxygen Induction Type: IV induction Ventilation: Mask ventilation without difficulty Laryngoscope Size: Mac and 4 Grade View: Grade II Tube type: Oral Tube size: 8.0 mm Number of attempts: 2 Airway Equipment and Method: Stylet Placement Confirmation: ETT inserted through vocal cords under direct vision,  positive ETCO2 and breath sounds checked- equal and bilateral Secured at: 22 cm Tube secured with: Tape Dental Injury: Teeth and Oropharynx as per pre-operative assessment

## 2017-06-21 NOTE — Consult Note (Signed)
PULMONARY / CRITICAL CARE MEDICINE   Name: [name] [name] MRN: [id number] DOB: 03-19-1925    ADMISSION DATE:  06/20/2017 CONSULTATION DATE:  06/21/2017  REFERRING MD:  Dr. Janee Morn   CHIEF COMPLAINT:  Hypotension in post-operative state   HISTORY OF PRESENT ILLNESS:   83 year old male with PMH of CKD stage 3, DM, CHF, BPH, HTN, HLD, Obesity   Presents to ED on 8/21 after syncopal episode leading to a fall. Upon arrival to ED systolic 70-80. Patient noted to have left hip fracture and on 8/22 underwent IM nail to the left hip by orthopedics. Postoperatively noted to be hypotensive and requiring pressors. Estimated 300 ml blood loss, given 500 ml fluid bolus. PCCM consulted.   Of note when discharged in May 2018, BP 95/51.   PAST MEDICAL HISTORY :  He  has a past medical history of BPH with elevated PSA; Chest pain; Chronic kidney disease, stage III (moderate); DM (diabetes mellitus), type 2 with renal complications [facility]); ED (erectile dysfunction); Glaucoma; Hyperlipidemia; Hypertension; Microalbuminuria; Microalbuminuria (2013); Obesity; Osteoarthritis; Prostatic hypertrophy, benign, with obstruction; Renal calculus; and Sick sinus syndrome (HCC).  PAST SURGICAL HISTORY: He  has a past surgical history that includes Knee surgery; Replacement total knee (02/26/01); and INTRAVASCULAR PRESSURE WIRE/FFR STUDY (N/A, 04/18/2017).  No Known Allergies  No current facility-administered medications on file prior to encounter.    Current Outpatient Prescriptions on File Prior to Encounter  Medication Sig  . alfuzosin (UROXATRAL) 10 MG 24 hr tablet Take 10 mg by mouth daily with breakfast.  . allopurinol (ZYLOPRIM) 100 MG tablet Take 100 mg by mouth daily.  . brinzolamide (AZOPT) 1 % ophthalmic suspension Place 1 drop into both eyes 3 (three) times daily.   . Coenzyme Q10 (CO Q 10 PO) Take 1 capsule by mouth daily.  Marland Kitchen ELIQUIS 5 MG TABS tablet Take 2.5 mg by mouth 2 (two) times daily.   .  finasteride (PROSCAR) 5 MG tablet Take 5 mg by mouth daily.  Marland Kitchen glimepiride (AMARYL) 4 MG tablet Take 0.5 tablets (2 mg total) by mouth daily.  . isosorbide mononitrate (IMDUR) 60 MG 24 hr tablet Take 60 mg by mouth daily.  Marland Kitchen MYRBETRIQ 50 MG TB24 tablet Take 50 mg by mouth daily.  . nitroGLYCERIN (NITROSTAT) 0.4 MG SL tablet Place 1 tablet (0.4 mg total) under the tongue every 5 (five) minutes as needed for chest pain.  Marland Kitchen RAPAFLO 8 MG CAPS capsule Take 8 mg by mouth daily.  . simvastatin (ZOCOR) 80 MG tablet Take 40 mg by mouth daily at 6 PM.   . torsemide (DEMADEX) 20 MG tablet Take 1 tablet (20 mg total) by mouth 2 (two) times daily.  . travoprost, benzalkonium, (TRAVATAN) 0.004 % ophthalmic solution Place 1 drop into both eyes at bedtime.    FAMILY HISTORY:  His indicated that his mother is deceased. He indicated that his father is deceased. He indicated that two of his three sisters are alive. He indicated that his maternal grandmother is deceased. He indicated that his maternal grandfather is deceased. He indicated that his paternal grandmother is deceased. He indicated that his paternal grandfather is deceased. He indicated that both of his sons are alive. He indicated that three of his six childs are alive.    SOCIAL HISTORY: He  reports that he has quit smoking. His smoking use included Cigarettes. He has never used smokeless tobacco. He reports that he does not drink alcohol or use drugs.  REVIEW OF SYSTEMS:  All negative; except for those that are bolded, which indicate positives.  Constitutional: weight loss, weight gain, night sweats, fevers, chills, fatigue, weakness.  HEENT: headaches, sore throat, sneezing, nasal congestion, post nasal drip, difficulty swallowing, tooth/dental problems, visual complaints, visual changes, ear aches. Neuro: difficulty with speech, weakness, numbness, ataxia. CV:  chest pain, orthopnea, PND, swelling in lower extremities, dizziness, palpitations,  syncope.  Resp: cough, hemoptysis, dyspnea, wheezing. GI: heartburn, indigestion, abdominal pain, nausea, vomiting, diarrhea, constipation, change in bowel habits, loss of appetite, hematemesis, melena, hematochezia.  GU: dysuria, change in color of urine, urgency or frequency, flank pain, hematuria. MSK: joint pain or swelling, decreased range of motion. Psych: change in mood or affect, depression, anxiety, suicidal ideations, homicidal ideations. Skin: rash, itching, bruising.  SUBJECTIVE:  Denies pain, denies dyspnea   VITAL SIGNS: BP (!) 91/53   Pulse (!) 57   Temp 97.7 F (36.5 C)   Resp 14   Wt 95.5 kg (210 lb 8.6 oz)   SpO2 94%   BMI 32.01 kg/m   HEMODYNAMICS:    VENTILATOR SETTINGS:    INTAKE / OUTPUT: I/O last 3 completed shifts: In: 1570 [P.O.:120; I.V.:1450] Out: 200 [Urine:200]  PHYSICAL EXAMINATION: General:  Elderly male, no distress  Neuro:  Alert, oriented, follows commands  HEENT:  Normocephalic  Cardiovascular: Irregular, no MRG  Lungs:  Clear breath sounds, non-labored  Abdomen:  Obese, active bowel sounds  Musculoskeletal:  +2 BLE  Skin:  Warm, dry > dressing noted to left hip   LABS:  BMET  Recent Labs Lab 06/20/17 2057  NA 143  K 3.6  CL 103  BUN 45*  CREATININE 1.80*  GLUCOSE 120*    Electrolytes No results for input(s): CALCIUM, MG, PHOS in the last 168 hours.  CBC  Recent Labs Lab 06/20/17 2054 06/20/17 2057  WBC 5.5  --   HGB 9.9* 10.2*  HCT 31.1* 30.0*  PLT 121*  --     Coag's No results for input(s): APTT, INR in the last 168 hours.  Sepsis Markers  Recent Labs Lab 06/20/17 2058  LATICACIDVEN 2.11*    ABG No results for input(s): PHART, PCO2ART, PO2ART in the last 168 hours.  Liver Enzymes  Recent Labs Lab 06/21/17 0404  AST 26  ALT 16*  ALKPHOS 75  BILITOT 1.7*  ALBUMIN 3.0*    Cardiac Enzymes No results for input(s): TROPONINI, PROBNP in the last 168 hours.  Glucose  Recent Labs Lab  06/21/17 0400 06/21/17 0921  GLUCAP 149* 140*    Imaging Ct Head Wo Contrast  Result Date: 06/20/2017 CLINICAL DATA:  Syncope. EXAM: CT HEAD WITHOUT CONTRAST CT CERVICAL SPINE WITHOUT CONTRAST TECHNIQUE: Multidetector CT imaging of the head and cervical spine was performed following the standard protocol without intravenous contrast. Multiplanar CT image reconstructions of the cervical spine were also generated. COMPARISON:  None. FINDINGS: CT HEAD FINDINGS Brain: Mild diffuse cortical atrophy is noted. No mass effect or midline shift is noted. Ventricular size is within normal limits. There is no evidence of mass lesion, hemorrhage or acute infarction. Vascular: Atherosclerosis of carotid siphons is noted. Skull: Normal. Negative for fracture or focal lesion. Sinuses/Orbits: No acute finding. Other: None. CT CERVICAL SPINE FINDINGS Alignment: Normal. Skull base and vertebrae: No acute fracture. No primary bone lesion or focal pathologic process. Soft tissues and spinal canal: No prevertebral fluid or swelling. No visible canal hematoma. Disc levels: Anterior osteophyte formation is noted at C4-5, C5-6 and C6-7. Upper chest: Negative. Other: Degenerative changes  seen involving posterior facet joints. IMPRESSION: Mild diffuse cortical atrophy. No acute intracranial abnormality seen. Mild degenerative changes as described above. No acute abnormality seen in the cervical spine. Electronically Signed   By: Lupita Raider, M.D.   On: 06/20/2017 22:50   Ct Cervical Spine Wo Contrast  Result Date: 06/20/2017 CLINICAL DATA:  Syncope. EXAM: CT HEAD WITHOUT CONTRAST CT CERVICAL SPINE WITHOUT CONTRAST TECHNIQUE: Multidetector CT imaging of the head and cervical spine was performed following the standard protocol without intravenous contrast. Multiplanar CT image reconstructions of the cervical spine were also generated. COMPARISON:  None. FINDINGS: CT HEAD FINDINGS Brain: Mild diffuse cortical atrophy is noted.  No mass effect or midline shift is noted. Ventricular size is within normal limits. There is no evidence of mass lesion, hemorrhage or acute infarction. Vascular: Atherosclerosis of carotid siphons is noted. Skull: Normal. Negative for fracture or focal lesion. Sinuses/Orbits: No acute finding. Other: None. CT CERVICAL SPINE FINDINGS Alignment: Normal. Skull base and vertebrae: No acute fracture. No primary bone lesion or focal pathologic process. Soft tissues and spinal canal: No prevertebral fluid or swelling. No visible canal hematoma. Disc levels: Anterior osteophyte formation is noted at C4-5, C5-6 and C6-7. Upper chest: Negative. Other: Degenerative changes seen involving posterior facet joints. IMPRESSION: Mild diffuse cortical atrophy. No acute intracranial abnormality seen. Mild degenerative changes as described above. No acute abnormality seen in the cervical spine. Electronically Signed   By: Lupita Raider, M.D.   On: 06/20/2017 22:50   Dg Chest Port 1 View  Result Date: 06/21/2017 CLINICAL DATA:  Hypotension EXAM: PORTABLE CHEST 1 VIEW COMPARISON:  06/20/2017 FINDINGS: Cardiomegaly. Low lung volumes with bibasilar atelectasis. Mild vascular congestion. No effusions. Old healed left rib fractures. IMPRESSION: Low lung volumes with bibasilar atelectasis. Mild vascular congestion. Electronically Signed   By: Charlett Nose M.D.   On: 06/21/2017 10:54   Dg Chest Port 1 View  Result Date: 06/20/2017 CLINICAL DATA:  Preop for hip fracture. EXAM: PORTABLE CHEST 1 VIEW COMPARISON:  Radiographs of December 10, 2015. FINDINGS: Stable cardiomediastinal silhouette with mild central pulmonary vascular congestion. Atherosclerosis of thoracic aorta is noted. No pneumothorax or significant pleural effusion is noted. No pulmonary abnormality is noted. Old left rib fractures are noted as well as deformity of left scapula suggesting old fracture. IMPRESSION: Aortic atherosclerosis. Stable central pulmonary vascular  congestion. No acute abnormality seen. Electronically Signed   By: Lupita Raider, M.D.   On: 06/20/2017 22:04   Dg C-arm 1-60 Min  Result Date: 06/21/2017 CLINICAL DATA:  Internal fixation EXAM: LEFT FEMUR PORTABLE 2 VIEWS; DG C-ARM 61-120 MIN COMPARISON:  06/20/2017 FINDINGS: Internal fixation within the medullary nail and hip screw across the intertrochanteric fracture. No hardware complicating feature. Changes of prior left knee replacement. IMPRESSION: Internal fixation across the left intertrochanteric fracture with near anatomic alignment and no complicating feature. Electronically Signed   By: Charlett Nose M.D.   On: 06/21/2017 08:47   Dg Hip Port Unilat With Pelvis 1v Left  Result Date: 06/21/2017 CLINICAL DATA:  Postop left intertrochanteric fracture EXAM: DG HIP (WITH OR WITHOUT PELVIS) 1V PORT LEFT COMPARISON:  None. FINDINGS: Internal fixation changes across the left femoral intertrochanteric fracture. There remains mild displacement of the greater and lesser trochanters. No subluxation or dislocation. No hardware complicating feature. IMPRESSION: Internal fixation across the left femoral intertrochanteric fracture with mild continued displacement as above. Electronically Signed   By: Charlett Nose M.D.   On: 06/21/2017 10:53  Dg Hip Unilat With Pelvis 2-3 Views Left  Result Date: 06/20/2017 CLINICAL DATA:  Pain after fall. EXAM: DG HIP (WITH OR WITHOUT PELVIS) 2-3V LEFT COMPARISON:  None. FINDINGS: There is an acute, closed, comminuted, varus angulated intertrochanteric fracture of the left femur with fractures undermining the greater trochanter and with slight avulsion of the lesser trochanter. The femoral head is seated within its acetabular component. The bony pelvis appears intact. There is mild lumbar degenerative disc disease of the included lumbar spine. Fine bony detail is limited by patient body habitus. Right femur and hip appear intact. No disruption of the sacroiliac joints and  pubic symphysis. The arcuate lines of the sacrum appear intact. IMPRESSION: 1. There is an acute, closed, comminuted, varus angulated intertrochanteric fracture of the left femur with fractures undermining the greater trochanter and with slight avulsion of the lesser trochanter. 2. Mild lumbar spondylosis. 3. Intact bony pelvis without diastasis. Electronically Signed   By: Tollie Eth M.D.   On: 06/20/2017 22:06   Dg Femur Port Min 2 Views Left  Result Date: 06/21/2017 CLINICAL DATA:  Internal fixation EXAM: LEFT FEMUR PORTABLE 2 VIEWS; DG C-ARM 61-120 MIN COMPARISON:  06/20/2017 FINDINGS: Internal fixation within the medullary nail and hip screw across the intertrochanteric fracture. No hardware complicating feature. Changes of prior left knee replacement. IMPRESSION: Internal fixation across the left intertrochanteric fracture with near anatomic alignment and no complicating feature. Electronically Signed   By: Charlett Nose M.D.   On: 06/21/2017 08:47     STUDIES:  CXR 8/21 > Aortic atherosclerosis. Stable central pulmonary vascular congestion. No acute abnormality seen Hip Xray 8/21 > There is an acute, closed, comminuted, varus angulated intertrochanteric fracture of the left femur with fractures undermining the greater trochanter and with slight avulsion of the lesser trochanter. Mild lumbar spondylosis. Intact bony pelvis without diastasis. CXR 8/22 > Low lung volumes with bibasilar atelectasis, mild vascular congestion   CULTURES: MRSA PCR 8/22 > Negative   ANTIBIOTICS: Ancef 8/22 >>  SIGNIFICANT EVENTS: 8/21 > Present to ED 8/22 > To OR for left hip nail placement   LINES/TUBES: PIV   DISCUSSION: 83 year old male with extensive health history presents 8/21 post-fall related to syncopal episode, found to have left hip fracture. 8/22 OR for nail placement. Hypotensive requiring Neo gtt.   ASSESSMENT / PLAN:  PULMONARY A: Respiratory Insufficiency in post-operative state  P:    Wean Fi02 for Saturation >92 Pulmonary Hygiene   CARDIOVASCULAR A:  Hypotension secondary to hypovolemia vs sedation s/p 1L NS  Elevated Troponin  -0.10 >>  H/O A.Fib, Sick Sinus Syndrome, HLD, HTN  P:  Cardiac Monitoring  Wean Neo to maintain MAP >65  Continue Lipitor  Hold diuretics and hypertensive medications  Trend Troponin   RENAL A:   CKD stage 4 with cardiorenal syndrome  H/O BPH P:   Trend BMP Replace Electrolytes as indicated   Continue Flomax  Give Additional 500 ml bolus  NS @ 75 ml/hr   GASTROINTESTINAL A:   No issues  P:   NPO PPI  HEMATOLOGIC A:   On Chronic Anticoagulation  P:  Trend CBC  Hold Eliquis   INFECTIOUS A:   No issues  P:   Trend WBC and Fever Curve Ancef post-op   ENDOCRINE A:   DM   P:   Trend Glucose  SSI  Cortisol pending   NEUROLOGIC A:   Acute Pain  P:   Monitor  Norco and Fentanyl  PRN  FAMILY  - Updates: Family updated by Dr. Jamison Neighbor > stated that patient is a full DNR. Okay with short term pressors, other wise DNR/DNI  - Inter-disciplinary family meet or Palliative Care meeting due by:  06/28/2017  CC Time: 45 minutes   Jovita Kussmaul, AGACNP-BC [facility]  Pgr: [phone]  PCCM Pgr: [phone]

## 2017-06-21 NOTE — Progress Notes (Signed)
CRITICAL VALUE ALERT  Critical Value: Trop. 0.11  Date & Time Notied:  06/21/17 2004  Provider Notified: [facility]  Orders Received/Actions taken: No new orders. Trop 0.10 on admission and cardiology called in ED. Not concerned since ptjust had a neg. heart cath in June.

## 2017-06-21 NOTE — Progress Notes (Signed)
eLink Physician-Brief Progress Note Patient Name: [name] [name] DOB: 03/17/1925 MRN: [id number]   Date of Service  06/21/2017  HPI/Events of Note  Already Seen by PCCM attending Shock-hypovolumia on pressors, IVF's  eICU Interventions  No new orders     Intervention Category Evaluation Type: New Patient Evaluation  Erin Fulling 06/21/2017, 5:42 PM

## 2017-06-21 NOTE — Anesthesia Postprocedure Evaluation (Signed)
Anesthesia Post Note  Patient: TYRES WIERZBA  Procedure(s) Performed: Procedure(s) (LRB): INTRAMEDULLARY (IM) NAIL INTERTROCHANTRIC Left Leg (Left)     Patient location during evaluation: PACU Anesthesia Type: General Level of consciousness: awake, sedated and patient cooperative Pain management: pain level controlled Vital Signs Assessment: post-procedure vital signs reviewed and stable Respiratory status: spontaneous breathing, nonlabored ventilation, respiratory function stable and patient connected to nasal cannula oxygen Cardiovascular status: BP remained low in PACU ,plan ICU admit. Postop Assessment: no signs of nausea or vomiting Anesthetic complications: no    Last Vitals:  Vitals:   06/21/17 0935 06/21/17 0950  BP: (!) 106/59 (!) 91/53  Pulse: (!) 59 (!) 57  Resp: 15 14  Temp:    SpO2: 97% 94%    Last Pain:  Vitals:   06/21/17 0920  TempSrc:   PainSc: 2                  Madalee Altmann,JAMES TERRILL

## 2017-06-21 NOTE — Anesthesia Preprocedure Evaluation (Addendum)
Anesthesia Evaluation  Patient identified by MRN, date of birth, ID band Patient awake    Reviewed: Allergy & Precautions, NPO status , Patient's Chart, lab work & pertinent test results  Airway Mallampati: II  TM Distance: >3 FB Neck ROM: Full    Dental no notable dental hx. (+) Caps   Pulmonary neg pulmonary ROS, former smoker,    breath sounds clear to auscultation       Cardiovascular hypertension, + dysrhythmias  Rhythm:Regular Rate:Bradycardia     Neuro/Psych    GI/Hepatic GERD  ,  Endo/Other  diabetes  Renal/GU Renal InsufficiencyRenal disease     Musculoskeletal   Abdominal (+) + obese,   Peds  Hematology   Anesthesia Other Findings   Reproductive/Obstetrics                            Anesthesia Physical Anesthesia Plan  ASA: III  Anesthesia Plan: General   Post-op Pain Management:    Induction: Intravenous  PONV Risk Score and Plan: 2 and Ondansetron, Dexamethasone and Treatment may vary due to age or medical condition  Airway Management Planned: Oral ETT  Additional Equipment:   Intra-op Plan:   Post-operative Plan: Extubation in OR  Informed Consent: I have reviewed the patients History and Physical, chart, labs and discussed the procedure including the risks, benefits and alternatives for the proposed anesthesia with the patient or authorized representative who has indicated his/her understanding and acceptance.   Dental advisory given  Plan Discussed with: CRNA  Anesthesia Plan Comments:        Anesthesia Quick Evaluation

## 2017-06-21 NOTE — Progress Notes (Signed)
Patient arrived to 3E24 from  mc ed. A&O x 4. Admit with left hip fracture. A fib on telemetry. VSS. Oriented to room and call system.

## 2017-06-21 NOTE — OR Nursing (Signed)
Dr. Janee Morn updated to patient status post sx. Orders recvd and MD to be at bs to assess. Dr. Jacklynn Bue also updated. Family to bs and updated also.

## 2017-06-21 NOTE — H&P (Addendum)
History and Physical    [name] [name] SEG:[id number] DOB: May 30, 1925 DOA: 06/20/2017  PCP: Gaspar Garbe, MD  Patient coming from: Home  I have personally briefly reviewed patient's old medical records in [facility]  Chief Complaint: Fall, hip pain  HPI: [name] [name] is a 83 y.o. male with medical history significant of CKD stage 3, DM2.  Patient presents to the ED with c/o syncope, fall and L hip pain.  Pain is severe, worse with movement.  Inability to bear weight.  No CP, does have dizziness.   ED Course: L hip fx.  Trop of 0.10.  Dr. Jacinto Halim called by EDP, but not concerned about the 0.10 trop, because conveniently enough the patient just had a heart cath in June which showed no significant CAD.  Trop is likely due to chronic diastolic CHF.   Review of Systems: As per HPI otherwise 10 point review of systems negative.   Past Medical History:  Diagnosis Date  . BPH with elevated PSA   . Chest pain   . Chronic kidney disease, stage III (moderate)   . DM (diabetes mellitus), type 2 with renal complications (HCC)   . ED (erectile dysfunction)   . Glaucoma   . Hyperlipidemia   . Hypertension   . Microalbuminuria   . Microalbuminuria 2013  . Obesity   . Osteoarthritis   . Prostatic hypertrophy, benign, with obstruction   . Renal calculus   . Sick sinus syndrome [facility])     Past Surgical History:  Procedure Laterality Date  . INTRAVASCULAR PRESSURE WIRE/FFR STUDY N/A 04/18/2017   Procedure: Intravascular Pressure Wire/FFR Study;  Surgeon: Yates Decamp, MD;  Location: [facility] INVASIVE CV LAB;  Service: Cardiovascular;  Laterality: N/A;  Prox LAD  . KNEE SURGERY    . REPLACEMENT TOTAL KNEE  02/26/01     reports that he has quit smoking. His smoking use included Cigarettes. He has never used smokeless tobacco. He reports that he does not drink alcohol or use drugs.  No Known Allergies  Family History  Problem Relation Age of Onset  . Cancer Paternal Grandfather         STOMACH  . Cancer Sister        MALE CANCER  . Cancer - Colon Sister   . Stroke Father   . Healthy Child   . Healthy Child   . Other Child        BRAIN DAMAGE AT BIRTH     Prior to Admission medications   Medication Sig Start Date End Date Taking? Authorizing Provider  alfuzosin (UROXATRAL) 10 MG 24 hr tablet Take 10 mg by mouth daily with breakfast.    [provider]  allopurinol (ZYLOPRIM) 100 MG tablet Take 100 mg by mouth daily.    [provider]  brinzolamide (AZOPT) 1 % ophthalmic suspension Place 1 drop into both eyes 3 (three) times daily.     [provider]  Coenzyme Q10 (CO Q 10 PO) Take 1 capsule by mouth daily.    [provider]  ELIQUIS 5 MG TABS tablet Take 2.5 mg by mouth 2 (two) times daily.  02/22/17   [provider]  finasteride (PROSCAR) 5 MG tablet Take 5 mg by mouth daily.    [provider]  glimepiride (AMARYL) 4 MG tablet Take 0.5 tablets (2 mg total) by mouth daily. 03/20/17   Yates Decamp, MD  isosorbide mononitrate (IMDUR) 60 MG 24 hr tablet Take 60 mg by mouth  daily. 02/19/16   [provider]  MYRBETRIQ 50 MG TB24 tablet Take 50 mg by mouth daily. 11/27/15   [provider]  nitroGLYCERIN (NITROSTAT) 0.4 MG SL tablet Place 1 tablet (0.4 mg total) under the tongue every 5 (five) minutes as needed for chest pain. 09/17/15   Nahser, Deloris Ping, MD  RAPAFLO 8 MG CAPS capsule Take 8 mg by mouth daily. 09/15/15   [provider]  simvastatin (ZOCOR) 80 MG tablet Take 40 mg by mouth daily at 6 PM.     [provider]  torsemide (DEMADEX) 20 MG tablet Take 1 tablet (20 mg total) by mouth 2 (two) times daily. 03/20/17   Yates Decamp, MD  travoprost, benzalkonium, (TRAVATAN) 0.004 % ophthalmic solution Place 1 drop into both eyes at bedtime.    [provider]    Physical Exam: Vitals:   06/21/17 0045 06/21/17 0100 06/21/17 0115 06/21/17 0130  BP: (!) 87/56 103/62  93/60 90/61  Pulse: (!) 57 65 63 (!) 57  Resp: 12 15 17 16   Temp:      TempSrc:      SpO2: 98% 97% 97% 96%    Constitutional: NAD, calm, comfortable Eyes: PERRL, lids and conjunctivae normal ENMT: Mucous membranes are moist. Posterior pharynx clear of any exudate or lesions.Normal dentition.  Neck: normal, supple, no masses, no thyromegaly Respiratory: clear to auscultation bilaterally, no wheezing, no crackles. Normal respiratory effort. No accessory muscle use.  Cardiovascular: Regular rate and rhythm, no murmurs / rubs / gallops. No extremity edema. 2+ pedal pulses. No carotid bruits.  Abdomen: no tenderness, no masses palpated. No hepatosplenomegaly. Bowel sounds positive.  Musculoskeletal: L hip pain with palpation Skin: no rashes, lesions, ulcers. No induration Neurologic: CN 2-12 grossly intact. Sensation intact, DTR normal. Strength 5/5 in all 4.  Psychiatric: Normal judgment and insight. Alert and oriented x 3. Normal mood.    Labs on Admission: I have personally reviewed following labs and imaging studies  CBC:  Recent Labs Lab 06/20/17 2054 06/20/17 2057  WBC 5.5  --   NEUTROABS 4.3  --   HGB 9.9* 10.2*  HCT 31.1* 30.0*  MCV 91.7  --   PLT 121*  --    Basic Metabolic Panel:  Recent Labs Lab 06/20/17 2057  NA 143  K 3.6  CL 103  GLUCOSE 120*  BUN 45*  CREATININE 1.80*   GFR: CrCl cannot be calculated (Unknown ideal weight.). Liver Function Tests: No results for input(s): AST, ALT, ALKPHOS, BILITOT, PROT, ALBUMIN in the last 168 hours. No results for input(s): LIPASE, AMYLASE in the last 168 hours. No results for input(s): AMMONIA in the last 168 hours. Coagulation Profile: No results for input(s): INR, PROTIME in the last 168 hours. Cardiac Enzymes: No results for input(s): CKTOTAL, CKMB, CKMBINDEX, TROPONINI in the last 168 hours. BNP (last 3 results) No results for input(s): PROBNP in the last 8760 hours. HbA1C: No results for input(s): HGBA1C  in the last 72 hours. CBG: No results for input(s): GLUCAP in the last 168 hours. Lipid Profile: No results for input(s): CHOL, HDL, LDLCALC, TRIG, CHOLHDL, LDLDIRECT in the last 72 hours. Thyroid Function Tests: No results for input(s): TSH, T4TOTAL, FREET4, T3FREE, THYROIDAB in the last 72 hours. Anemia Panel: No results for input(s): VITAMINB12, FOLATE, FERRITIN, TIBC, IRON, RETICCTPCT in the last 72 hours. Urine analysis:    Component Value Date/Time   COLORURINE YELLOW 12/10/2015 1705   APPEARANCEUR CLEAR 12/10/2015 1705   LABSPEC 1.021 12/10/2015  1705   PHURINE 5.0 12/10/2015 1705   GLUCOSEU NEGATIVE 12/10/2015 1705   HGBUR SMALL (A) 12/10/2015 1705   BILIRUBINUR NEGATIVE 12/10/2015 1705   KETONESUR NEGATIVE 12/10/2015 1705   PROTEINUR 30 (A) 12/10/2015 1705   NITRITE NEGATIVE 12/10/2015 1705   LEUKOCYTESUR NEGATIVE 12/10/2015 1705    Radiological Exams on Admission: Ct Head Wo Contrast  Result Date: 06/20/2017 CLINICAL DATA:  Syncope. EXAM: CT HEAD WITHOUT CONTRAST CT CERVICAL SPINE WITHOUT CONTRAST TECHNIQUE: Multidetector CT imaging of the head and cervical spine was performed following the standard protocol without intravenous contrast. Multiplanar CT image reconstructions of the cervical spine were also generated. COMPARISON:  None. FINDINGS: CT HEAD FINDINGS Brain: Mild diffuse cortical atrophy is noted. No mass effect or midline shift is noted. Ventricular size is within normal limits. There is no evidence of mass lesion, hemorrhage or acute infarction. Vascular: Atherosclerosis of carotid siphons is noted. Skull: Normal. Negative for fracture or focal lesion. Sinuses/Orbits: No acute finding. Other: None. CT CERVICAL SPINE FINDINGS Alignment: Normal. Skull base and vertebrae: No acute fracture. No primary bone lesion or focal pathologic process. Soft tissues and spinal canal: No prevertebral fluid or swelling. No visible canal hematoma. Disc levels: Anterior osteophyte  formation is noted at C4-5, C5-6 and C6-7. Upper chest: Negative. Other: Degenerative changes seen involving posterior facet joints. IMPRESSION: Mild diffuse cortical atrophy. No acute intracranial abnormality seen. Mild degenerative changes as described above. No acute abnormality seen in the cervical spine. Electronically Signed   By: Lupita Raider, M.D.   On: 06/20/2017 22:50   Ct Cervical Spine Wo Contrast  Result Date: 06/20/2017 CLINICAL DATA:  Syncope. EXAM: CT HEAD WITHOUT CONTRAST CT CERVICAL SPINE WITHOUT CONTRAST TECHNIQUE: Multidetector CT imaging of the head and cervical spine was performed following the standard protocol without intravenous contrast. Multiplanar CT image reconstructions of the cervical spine were also generated. COMPARISON:  None. FINDINGS: CT HEAD FINDINGS Brain: Mild diffuse cortical atrophy is noted. No mass effect or midline shift is noted. Ventricular size is within normal limits. There is no evidence of mass lesion, hemorrhage or acute infarction. Vascular: Atherosclerosis of carotid siphons is noted. Skull: Normal. Negative for fracture or focal lesion. Sinuses/Orbits: No acute finding. Other: None. CT CERVICAL SPINE FINDINGS Alignment: Normal. Skull base and vertebrae: No acute fracture. No primary bone lesion or focal pathologic process. Soft tissues and spinal canal: No prevertebral fluid or swelling. No visible canal hematoma. Disc levels: Anterior osteophyte formation is noted at C4-5, C5-6 and C6-7. Upper chest: Negative. Other: Degenerative changes seen involving posterior facet joints. IMPRESSION: Mild diffuse cortical atrophy. No acute intracranial abnormality seen. Mild degenerative changes as described above. No acute abnormality seen in the cervical spine. Electronically Signed   By: Lupita Raider, M.D.   On: 06/20/2017 22:50   Dg Chest Port 1 View  Result Date: 06/20/2017 CLINICAL DATA:  Preop for hip fracture. EXAM: PORTABLE CHEST 1 VIEW COMPARISON:   Radiographs of December 10, 2015. FINDINGS: Stable cardiomediastinal silhouette with mild central pulmonary vascular congestion. Atherosclerosis of thoracic aorta is noted. No pneumothorax or significant pleural effusion is noted. No pulmonary abnormality is noted. Old left rib fractures are noted as well as deformity of left scapula suggesting old fracture. IMPRESSION: Aortic atherosclerosis. Stable central pulmonary vascular congestion. No acute abnormality seen. Electronically Signed   By: Lupita Raider, M.D.   On: 06/20/2017 22:04   Dg Hip Unilat With Pelvis 2-3 Views Left  Result Date: 06/20/2017 CLINICAL DATA:  Pain after fall. EXAM: DG HIP (WITH OR WITHOUT PELVIS) 2-3V LEFT COMPARISON:  None. FINDINGS: There is an acute, closed, comminuted, varus angulated intertrochanteric fracture of the left femur with fractures undermining the greater trochanter and with slight avulsion of the lesser trochanter. The femoral head is seated within its acetabular component. The bony pelvis appears intact. There is mild lumbar degenerative disc disease of the included lumbar spine. Fine bony detail is limited by patient body habitus. Right femur and hip appear intact. No disruption of the sacroiliac joints and pubic symphysis. The arcuate lines of the sacrum appear intact. IMPRESSION: 1. There is an acute, closed, comminuted, varus angulated intertrochanteric fracture of the left femur with fractures undermining the greater trochanter and with slight avulsion of the lesser trochanter. 2. Mild lumbar spondylosis. 3. Intact bony pelvis without diastasis. Electronically Signed   By: Tollie Eth M.D.   On: 06/20/2017 22:06    EKG: Independently reviewed.  Assessment/Plan Principal Problem:   Closed left hip fracture, initial encounter (HCC) Active Problems:   DM (diabetes mellitus), type 2 with renal complications (HCC)   Chronic kidney disease, stage III (moderate)    1. L hip fx - 1. To OR in AM 2. Trop of  0.10, Dr. Jacinto Halim, not concerned as patient just had a heart cath in June which apparently showed no significant pathology. 3. Will keep patient on tele due to syncope. 4. Hip fx pathway 5. Pain ctrl per pathway 6. NPO after mn 2. DM2 - 1. SSI sensitive scale Q4H 3. CKD stage 3 - chronic, stable, and creat is baseline  DVT prophylaxis: SCDs, resume eliquis post op when OK with ortho Code Status: Full Family Communication: Granddaughter at bedside Disposition Plan: Likely Rehab after admit Consults called: Ortho to take to OR tomorrow Admission status: Admit to inpatient   Hillary Bow DO [facility] Pager [phone]  If 7AM-7PM, please contact day team taking care of patient [email or web address] Password [email or web address]  06/21/2017, 1:55 AM

## 2017-06-21 NOTE — ED Notes (Signed)
Dr. Garner in to assess pt for admission at this time °

## 2017-06-21 NOTE — Progress Notes (Signed)
PROGRESS NOTE    [name] [name]  OZD:[id number] DOB: [birth year] DOA: 06/20/2017 PCP: Gaspar Garbe, MD    Brief Narrative:  Patient is a 83 year old gentleman history of chronic kidney disease stage III, diabetes type 2, CHF, BPH presenting to the ED after a syncopal episode with a fall and left hip pain. Patient with inability to bear weight. Patient noted to have a left hip fracture and underwent IM nail to the left hip per orthopedics. Postoperatively patient noted to be persistently hypotensive on pressors and critical care consulted.   Assessment & Plan:   Principal Problem:   Hypotension Active Problems:   Syncope   Closed left hip fracture, initial encounter (HCC)   GERD   DM (diabetes mellitus), type 2 with renal complications (HCC)   Hypertension   Hyperlipidemia   Chronic kidney disease, stage III (moderate)   Atrial fibrillation (HCC)   Anemia   Elevated troponin  #1 hypotension Questionable etiology. Patient noted to have a syncopal episode leading to his fracture prior to admission. Concerns for hypovolemic hypotension versus infectious etiology versus cardiogenic etiology versus pulmonary etiology. Patient with no overt bleeding. Patient hypotensive currently on neosynephrine. Patient endorses compliance with his anticoagulation. Patient received a liter bolus of normal saline. Give another bolus 500 mL normal saline 1 and placed on normal saline 75 mL per hour. Patient with chronic kidney disease and concerns for volume overload. Check blood cultures, UA with cultures and sensitivities, chest x-ray, cortisol level, cycle cardiac enzymes every 6 hours 3, check a 2-D echo, check lower extremity Dopplers. Hold patient's Demadex and Imdur. Gentle hydration. Consult with critical care medicine for further evaluation as patient may require ICU care.  #2 syncope Patient had a syncopal episode prior to admission leading to his fall and left hip fracture. Questionable  etiology. Patient noted to be persistently hypotensive and may be due to volume depletion in the setting of diuretics and nitrates. Patient noted to have troponin elevation of 0.10 on admission. Will cycle cardiac enzymes every 6 hours 3. Check a 2-D echo. Check lower extremity Dopplers. Patient does state has been compliant with his anticoagulation of eliquis. CT head negative. Patient with no focal neurological deficits noted on examination. Monitor on telemetry. Continue to hydrate gently with IV fluids. Hold diuretics. Follow.  #3 atrial fibrillation Currently rate controlled. Patient was on eliquis prior to admission. Patient postop. Orthopedics to advise when anticoagulation may be resumed.  #4 anemia Questionable etiology. Patient with no overt bleeding. Check an anemia panel. FOBT. Follow H&H. Transfusion threshold hemoglobin less than 8.  #5 elevated troponin Questionable etiology. Patient now with hypotension. Cycle cardiac enzymes every 6 hours 3. Check a 2-D echo. If cardiac enzymes continue to rise a 2-D echo is abnormal we'll consulted cardiology for further evaluation and management.  #6 closed left hip fracture Secondary to mechanical fall secondary to syncope. Patient status post IM nail per orthopedics. Per orthopedics.  #7 diabetes mellitus type 2 Hold oral hypoglycemic agents. Check a hemoglobin A1c. Sliding scale insulin.  #8 hypertension Hold antihypertensive medications.  #9 hyperlipidemia Continue signed.  #10 BPH Continue Flomax, Proscar, uroxatral. Outpatient follow-up.  #11 chronic kidney disease stage III Labs pending for this morning. Gentle hydration. Monitor for volume overload with hydration.   DVT prophylaxis: SCDs. Postop per orthopedics to determine when patient can be resumed on home regimen eliquis Code Status: Full Family Communication: Updated family at bedside. Disposition Plan: Transfer to ICU from PACU   Consultants:  Orthopedics: Dr.  Margarita Rana 06/21/2017  PCCM pending  Procedures:   CT head CT /C-spine 06/20/2017  Chest x-ray 06/20/2017  IM nail intertrochanteric LLE --per Dr. Margarita Rana 06/21/2017  Antimicrobials:   None   Subjective: In PACU. Drowsy. Easily arousable with verbal stimuli. Answering questions appropriately. Following some commands. Drifts back off to sleep. Patient on Neo-Synephrine drip secondary to persistent hypotension with systolic blood pressures in the 70s.  Objective: Vitals:   06/21/17 0401 06/21/17 0920 06/21/17 0935 06/21/17 0950  BP: (!) 83/54 97/60 (!) 106/59 (!) 91/53  Pulse: (!) 51 66 (!) 59 (!) 57  Resp: 16 20 15 14   Temp: (!) 97.5 F (36.4 C) 97.7 F (36.5 C)    TempSrc: Oral     SpO2: 93% 94% 97% 94%  Weight: 95.5 kg (210 lb 8.6 oz)       Intake/Output Summary (Last 24 hours) at 06/21/17 1034 Last data filed at 06/21/17 4010  Gross per 24 hour  Intake             2520 ml  Output              515 ml  Net             2005 ml   Filed Weights   06/21/17 0401  Weight: 95.5 kg (210 lb 8.6 oz)    Examination:  General exam: Drowsy. Dry mucous membranes. Respiratory system: Clear to auscultation anterior lung fields. No wheezing.  Cardiovascular system: Irregularly irregular. No JVD, murmurs, rubs, gallops or clicks. Trace bilateral lower extremity edema. Gastrointestinal system: Abdomen is nondistended, soft and nontender. No organomegaly or masses felt. Normal bowel sounds heard. Central nervous system: Sedated. Drowsy. Just out of OR however opens eyes to verbal stimuli and answering some questions appropriately.  Extremities: Symmetric 5 x 5 power. Skin: No rashes, lesions or ulcers Psychiatry: Judgement and insight appear fair. Mood & affect appropriate.     Data Reviewed: I have personally reviewed following labs and imaging studies  CBC:  Recent Labs Lab 06/20/17 2054 06/20/17 2057  WBC 5.5  --   NEUTROABS 4.3  --   HGB 9.9* 10.2*    HCT 31.1* 30.0*  MCV 91.7  --   PLT 121*  --    Basic Metabolic Panel:  Recent Labs Lab 06/20/17 2057  NA 143  K 3.6  CL 103  GLUCOSE 120*  BUN 45*  CREATININE 1.80*   GFR: Estimated Creatinine Clearance: 31.2 mL/min (A) (by C-G formula based on SCr of 1.8 mg/dL (H)). Liver Function Tests:  Recent Labs Lab 06/21/17 0404  AST 26  ALT 16*  ALKPHOS 75  BILITOT 1.7*  PROT 6.1*  ALBUMIN 3.0*   No results for input(s): LIPASE, AMYLASE in the last 168 hours. No results for input(s): AMMONIA in the last 168 hours. Coagulation Profile: No results for input(s): INR, PROTIME in the last 168 hours. Cardiac Enzymes: No results for input(s): CKTOTAL, CKMB, CKMBINDEX, TROPONINI in the last 168 hours. BNP (last 3 results) No results for input(s): PROBNP in the last 8760 hours. HbA1C: No results for input(s): HGBA1C in the last 72 hours. CBG:  Recent Labs Lab 06/21/17 0400 06/21/17 0921  GLUCAP 149* 140*   Lipid Profile: No results for input(s): CHOL, HDL, LDLCALC, TRIG, CHOLHDL, LDLDIRECT in the last 72 hours. Thyroid Function Tests: No results for input(s): TSH, T4TOTAL, FREET4, T3FREE, THYROIDAB in the last 72 hours. Anemia Panel: No results for input(s): VITAMINB12, FOLATE, FERRITIN,  TIBC, IRON, RETICCTPCT in the last 72 hours. Sepsis Labs:  Recent Labs Lab 06/20/17 2058  LATICACIDVEN 2.11*    Recent Results (from the past 240 hour(s))  MRSA PCR Screening     Status: None   Collection Time: 06/21/17  5:47 AM  Result Value Ref Range Status   MRSA by PCR NEGATIVE NEGATIVE Final    Comment:        The GeneXpert MRSA Assay (FDA approved for NASAL specimens only), is one component of a comprehensive MRSA colonization surveillance program. It is not intended to diagnose MRSA infection nor to guide or monitor treatment for MRSA infections.          Radiology Studies: Ct Head Wo Contrast  Result Date: 06/20/2017 CLINICAL DATA:  Syncope. EXAM: CT  HEAD WITHOUT CONTRAST CT CERVICAL SPINE WITHOUT CONTRAST TECHNIQUE: Multidetector CT imaging of the head and cervical spine was performed following the standard protocol without intravenous contrast. Multiplanar CT image reconstructions of the cervical spine were also generated. COMPARISON:  None. FINDINGS: CT HEAD FINDINGS Brain: Mild diffuse cortical atrophy is noted. No mass effect or midline shift is noted. Ventricular size is within normal limits. There is no evidence of mass lesion, hemorrhage or acute infarction. Vascular: Atherosclerosis of carotid siphons is noted. Skull: Normal. Negative for fracture or focal lesion. Sinuses/Orbits: No acute finding. Other: None. CT CERVICAL SPINE FINDINGS Alignment: Normal. Skull base and vertebrae: No acute fracture. No primary bone lesion or focal pathologic process. Soft tissues and spinal canal: No prevertebral fluid or swelling. No visible canal hematoma. Disc levels: Anterior osteophyte formation is noted at C4-5, C5-6 and C6-7. Upper chest: Negative. Other: Degenerative changes seen involving posterior facet joints. IMPRESSION: Mild diffuse cortical atrophy. No acute intracranial abnormality seen. Mild degenerative changes as described above. No acute abnormality seen in the cervical spine. Electronically Signed   By: Lupita Raider, M.D.   On: 06/20/2017 22:50   Ct Cervical Spine Wo Contrast  Result Date: 06/20/2017 CLINICAL DATA:  Syncope. EXAM: CT HEAD WITHOUT CONTRAST CT CERVICAL SPINE WITHOUT CONTRAST TECHNIQUE: Multidetector CT imaging of the head and cervical spine was performed following the standard protocol without intravenous contrast. Multiplanar CT image reconstructions of the cervical spine were also generated. COMPARISON:  None. FINDINGS: CT HEAD FINDINGS Brain: Mild diffuse cortical atrophy is noted. No mass effect or midline shift is noted. Ventricular size is within normal limits. There is no evidence of mass lesion, hemorrhage or acute  infarction. Vascular: Atherosclerosis of carotid siphons is noted. Skull: Normal. Negative for fracture or focal lesion. Sinuses/Orbits: No acute finding. Other: None. CT CERVICAL SPINE FINDINGS Alignment: Normal. Skull base and vertebrae: No acute fracture. No primary bone lesion or focal pathologic process. Soft tissues and spinal canal: No prevertebral fluid or swelling. No visible canal hematoma. Disc levels: Anterior osteophyte formation is noted at C4-5, C5-6 and C6-7. Upper chest: Negative. Other: Degenerative changes seen involving posterior facet joints. IMPRESSION: Mild diffuse cortical atrophy. No acute intracranial abnormality seen. Mild degenerative changes as described above. No acute abnormality seen in the cervical spine. Electronically Signed   By: Lupita Raider, M.D.   On: 06/20/2017 22:50   Dg Chest Port 1 View  Result Date: 06/20/2017 CLINICAL DATA:  Preop for hip fracture. EXAM: PORTABLE CHEST 1 VIEW COMPARISON:  Radiographs of December 10, 2015. FINDINGS: Stable cardiomediastinal silhouette with mild central pulmonary vascular congestion. Atherosclerosis of thoracic aorta is noted. No pneumothorax or significant pleural effusion is noted. No pulmonary abnormality  is noted. Old left rib fractures are noted as well as deformity of left scapula suggesting old fracture. IMPRESSION: Aortic atherosclerosis. Stable central pulmonary vascular congestion. No acute abnormality seen. Electronically Signed   By: Lupita Raider, M.D.   On: 06/20/2017 22:04   Dg C-arm 1-60 Min  Result Date: 06/21/2017 CLINICAL DATA:  Internal fixation EXAM: LEFT FEMUR PORTABLE 2 VIEWS; DG C-ARM 61-120 MIN COMPARISON:  06/20/2017 FINDINGS: Internal fixation within the medullary nail and hip screw across the intertrochanteric fracture. No hardware complicating feature. Changes of prior left knee replacement. IMPRESSION: Internal fixation across the left intertrochanteric fracture with near anatomic alignment and no  complicating feature. Electronically Signed   By: Charlett Nose M.D.   On: 06/21/2017 08:47   Dg Hip Unilat With Pelvis 2-3 Views Left  Result Date: 06/20/2017 CLINICAL DATA:  Pain after fall. EXAM: DG HIP (WITH OR WITHOUT PELVIS) 2-3V LEFT COMPARISON:  None. FINDINGS: There is an acute, closed, comminuted, varus angulated intertrochanteric fracture of the left femur with fractures undermining the greater trochanter and with slight avulsion of the lesser trochanter. The femoral head is seated within its acetabular component. The bony pelvis appears intact. There is mild lumbar degenerative disc disease of the included lumbar spine. Fine bony detail is limited by patient body habitus. Right femur and hip appear intact. No disruption of the sacroiliac joints and pubic symphysis. The arcuate lines of the sacrum appear intact. IMPRESSION: 1. There is an acute, closed, comminuted, varus angulated intertrochanteric fracture of the left femur with fractures undermining the greater trochanter and with slight avulsion of the lesser trochanter. 2. Mild lumbar spondylosis. 3. Intact bony pelvis without diastasis. Electronically Signed   By: Tollie Eth M.D.   On: 06/20/2017 22:06   Dg Femur Port Min 2 Views Left  Result Date: 06/21/2017 CLINICAL DATA:  Internal fixation EXAM: LEFT FEMUR PORTABLE 2 VIEWS; DG C-ARM 61-120 MIN COMPARISON:  06/20/2017 FINDINGS: Internal fixation within the medullary nail and hip screw across the intertrochanteric fracture. No hardware complicating feature. Changes of prior left knee replacement. IMPRESSION: Internal fixation across the left intertrochanteric fracture with near anatomic alignment and no complicating feature. Electronically Signed   By: Charlett Nose M.D.   On: 06/21/2017 08:47        Scheduled Meds: . acetaminophen  1,000 mg Oral Once  . [MAR Hold] alfuzosin  10 mg Oral Q breakfast  . [MAR Hold] allopurinol  100 mg Oral Daily  . [MAR Hold] atorvastatin  40 mg Oral  q1800  . [MAR Hold] brinzolamide  1 drop Both Eyes TID  . chlorhexidine  60 mL Topical Once  . fentaNYL      . [MAR Hold] finasteride  5 mg Oral Daily  . [MAR Hold] insulin aspart  0-9 Units Subcutaneous Q4H  . [MAR Hold] mirabegron ER  50 mg Oral Daily  . [MAR Hold] tamsulosin  0.4 mg Oral Daily  . [MAR Hold] Travoprost (BAK Free)  1 drop Both Eyes QHS   Continuous Infusions: . sodium chloride    . lactated ringers    . phenylephrine (NEO-SYNEPHRINE) Adult infusion 35 mcg/min (06/21/17 0945)  . sodium chloride       LOS: 0 days    Time spent: 40 mins    Kassidy Dockendorf, MD [facility] Pager [phone]  If 7PM-7AM, please contact night-coverage [email or web address] Password [facility] 06/21/2017, 10:34 AM

## 2017-06-21 NOTE — Op Note (Signed)
DATE OF SURGERY:  06/21/2017  TIME: 8:34 AM  PATIENT NAME:  [name] [name]  AGE: 83 y.o.  PRE-OPERATIVE DIAGNOSIS:  left hip fracture  POST-OPERATIVE DIAGNOSIS:  SAME  PROCEDURE:  INTRAMEDULLARY (IM) NAIL INTERTROCHANTRIC Left Leg  SURGEON:  Starla Deller D  ASSISTANT:  Aquilla Hacker, PA-C, he was present and scrubbed throughout the case, critical for completion in a timely fashion, and for retraction, instrumentation, and closure.   OPERATIVE IMPLANTS: Stryker Gamma Nail  PREOPERATIVE INDICATIONS:  [name] [name] is a 83 y.o. year old who fell and suffered a hip fracture. He was brought into the ER and then admitted and optimized and then elected for surgical intervention.    The risks benefits and alternatives were discussed with the patient including but not limited to the risks of nonoperative treatment, versus surgical intervention including infection, bleeding, nerve injury, malunion, nonunion, hardware prominence, hardware failure, need for hardware removal, blood clots, cardiopulmonary complications, morbidity, mortality, among others, and they were willing to proceed.    OPERATIVE PROCEDURE:  The patient was brought to the operating room and placed in the supine position. General anesthesia was administered. He was placed on the fracture table.  Closed reduction was performed under C-arm guidance. Time out was then performed after sterile prep and drape. He received preoperative antibiotics.  Incision was made proximal to the greater trochanter. A guidewire was placed in the appropriate position. Confirmation was made on AP and lateral views. The above-named nail was opened. I opened the proximal femur with a reamer. I then placed the nail by hand easily down. I did not need to ream the femur.  Once the nail was completely seated, I placed a guidepin into the femoral head into the center center position. I measured the length, and then reamed the lateral cortex and  up into the head. I then placed the lag screw. Slight compression was applied. Anatomic fixation achieved. Bone quality was mediocre.  I then secured the proximal interlocking bolt, and took off a half a turn, and then removed the instruments, and took final C-arm pictures AP and lateral the entire length of the leg.  I then used perfect circles technique to place a distal interlock screw.   Anatomic reconstruction was achieved, and the wounds were irrigated copiously and closed with Vicryl followed by staples and sterile gauze for the skin. The patient was awakened and returned to PACU in stable and satisfactory condition. There no complications and the patient tolerated the procedure well.  He will be weightbearing as tolerated, and will be on chemical px  for a period of four weeks after discharge.   [name] [name], M.D.

## 2017-06-21 NOTE — Consult Note (Signed)
ORTHOPAEDIC CONSULTATION  REQUESTING PHYSICIAN: Etta Quill, DO  Chief Complaint: left hip fracture  HPI: [name] [name] is a 83 y.o. male who complains of a fall yesterday with left hip/groin pain.   Past Medical History:  Diagnosis Date  . BPH with elevated PSA   . Chest pain   . Chronic kidney disease, stage III (moderate)   . DM (diabetes mellitus), type 2 with renal complications ([place])   . ED (erectile dysfunction)   . Glaucoma   . Hyperlipidemia   . Hypertension   . Microalbuminuria   . Microalbuminuria 2013  . Obesity   . Osteoarthritis   . Prostatic hypertrophy, benign, with obstruction   . Renal calculus   . Sick sinus syndrome [facility])    Past Surgical History:  Procedure Laterality Date  . INTRAVASCULAR PRESSURE WIRE/FFR STUDY N/A 04/18/2017   Procedure: Intravascular Pressure Wire/FFR Study;  Surgeon: Adrian Prows, MD;  Location: [facility];  Service: Cardiovascular;  Laterality: N/A;  Prox LAD  . KNEE SURGERY    . REPLACEMENT TOTAL KNEE  02/26/01   Social History   Social History  . Marital status: Married    Spouse name: N/A  . Number of children: N/A  . Years of education: N/A   Social History Main Topics  . Smoking status: Former Smoker    Types: Cigarettes  . Smokeless tobacco: Never Used  . Alcohol use No  . Drug use: No  . Sexual activity: Not Asked   Other Topics Concern  . None   Social History Narrative  . None   Family History  Problem Relation Age of Onset  . Cancer Paternal Grandfather        STOMACH  . Cancer Sister        MALE CANCER  . Cancer - Colon Sister   . Stroke Father   . Healthy Child   . Healthy Child   . Other Child        BRAIN DAMAGE AT BIRTH   No Known Allergies Prior to Admission medications   Medication Sig Start Date End Date Taking? Authorizing Provider  alfuzosin (UROXATRAL) 10 MG 24 hr tablet Take 10 mg by mouth daily with breakfast.    [provider]  allopurinol  (ZYLOPRIM) 100 MG tablet Take 100 mg by mouth daily.    [provider]  brinzolamide (AZOPT) 1 % ophthalmic suspension Place 1 drop into both eyes 3 (three) times daily.     [provider]  Coenzyme Q10 (CO Q 10 PO) Take 1 capsule by mouth daily.    [provider]  ELIQUIS 5 MG TABS tablet Take 2.5 mg by mouth 2 (two) times daily.  02/22/17   [provider]  finasteride (PROSCAR) 5 MG tablet Take 5 mg by mouth daily.    [provider]  glimepiride (AMARYL) 4 MG tablet Take 0.5 tablets (2 mg total) by mouth daily. 03/20/17   Adrian Prows, MD  isosorbide mononitrate (IMDUR) 60 MG 24 hr tablet Take 60 mg by mouth daily. 02/19/16   [provider]  MYRBETRIQ 50 MG TB24 tablet Take 50 mg by mouth daily. 11/27/15   [provider]  nitroGLYCERIN (NITROSTAT) 0.4 MG SL tablet Place 1 tablet (0.4 mg total) under the tongue every 5 (five) minutes as needed for chest pain. 09/17/15   Nahser, Wonda Cheng, MD  RAPAFLO 8 MG CAPS capsule Take 8 mg by mouth daily. 09/15/15  [provider]  simvastatin (ZOCOR) 80 MG tablet Take 40 mg by mouth daily at 6 PM.     [provider]  torsemide (DEMADEX) 20 MG tablet Take 1 tablet (20 mg total) by mouth 2 (two) times daily. 03/20/17   Adrian Prows, MD  travoprost, benzalkonium, (TRAVATAN) 0.004 % ophthalmic solution Place 1 drop into both eyes at bedtime.    [provider]   Ct Head Wo Contrast  Result Date: 06/20/2017 CLINICAL DATA:  Syncope. EXAM: CT HEAD WITHOUT CONTRAST CT CERVICAL SPINE WITHOUT CONTRAST TECHNIQUE: Multidetector CT imaging of the head and cervical spine was performed following the standard protocol without intravenous contrast. Multiplanar CT image reconstructions of the cervical spine were also generated. COMPARISON:  None. FINDINGS: CT HEAD FINDINGS Brain: Mild diffuse cortical atrophy is noted. No mass effect or midline shift is noted. Ventricular size is within  normal limits. There is no evidence of mass lesion, hemorrhage or acute infarction. Vascular: Atherosclerosis of carotid siphons is noted. Skull: Normal. Negative for fracture or focal lesion. Sinuses/Orbits: No acute finding. Other: None. CT CERVICAL SPINE FINDINGS Alignment: Normal. Skull base and vertebrae: No acute fracture. No primary bone lesion or focal pathologic process. Soft tissues and spinal canal: No prevertebral fluid or swelling. No visible canal hematoma. Disc levels: Anterior osteophyte formation is noted at C4-5, C5-6 and C6-7. Upper chest: Negative. Other: Degenerative changes seen involving posterior facet joints. IMPRESSION: Mild diffuse cortical atrophy. No acute intracranial abnormality seen. Mild degenerative changes as described above. No acute abnormality seen in the cervical spine. Electronically Signed   By: Marijo Conception, M.D.   On: 06/20/2017 22:50   Ct Cervical Spine Wo Contrast  Result Date: 06/20/2017 CLINICAL DATA:  Syncope. EXAM: CT HEAD WITHOUT CONTRAST CT CERVICAL SPINE WITHOUT CONTRAST TECHNIQUE: Multidetector CT imaging of the head and cervical spine was performed following the standard protocol without intravenous contrast. Multiplanar CT image reconstructions of the cervical spine were also generated. COMPARISON:  None. FINDINGS: CT HEAD FINDINGS Brain: Mild diffuse cortical atrophy is noted. No mass effect or midline shift is noted. Ventricular size is within normal limits. There is no evidence of mass lesion, hemorrhage or acute infarction. Vascular: Atherosclerosis of carotid siphons is noted. Skull: Normal. Negative for fracture or focal lesion. Sinuses/Orbits: No acute finding. Other: None. CT CERVICAL SPINE FINDINGS Alignment: Normal. Skull base and vertebrae: No acute fracture. No primary bone lesion or focal pathologic process. Soft tissues and spinal canal: No prevertebral fluid or swelling. No visible canal hematoma. Disc levels: Anterior osteophyte formation  is noted at C4-5, C5-6 and C6-7. Upper chest: Negative. Other: Degenerative changes seen involving posterior facet joints. IMPRESSION: Mild diffuse cortical atrophy. No acute intracranial abnormality seen. Mild degenerative changes as described above. No acute abnormality seen in the cervical spine. Electronically Signed   By: Marijo Conception, M.D.   On: 06/20/2017 22:50   Dg Chest Port 1 View  Result Date: 06/20/2017 CLINICAL DATA:  Preop for hip fracture. EXAM: PORTABLE CHEST 1 VIEW COMPARISON:  Radiographs of December 10, 2015. FINDINGS: Stable cardiomediastinal silhouette with mild central pulmonary vascular congestion. Atherosclerosis of thoracic aorta is noted. No pneumothorax or significant pleural effusion is noted. No pulmonary abnormality is noted. Old left rib fractures are noted as well as deformity of left scapula suggesting old fracture. IMPRESSION: Aortic atherosclerosis. Stable central pulmonary vascular congestion. No acute abnormality seen. Electronically Signed   By: Marijo Conception, M.D.   On: 06/20/2017 22:04  Dg Hip Unilat With Pelvis 2-3 Views Left  Result Date: 06/20/2017 CLINICAL DATA:  Pain after fall. EXAM: DG HIP (WITH OR WITHOUT PELVIS) 2-3V LEFT COMPARISON:  None. FINDINGS: There is an acute, closed, comminuted, varus angulated intertrochanteric fracture of the left femur with fractures undermining the greater trochanter and with slight avulsion of the lesser trochanter. The femoral head is seated within its acetabular component. The bony pelvis appears intact. There is mild lumbar degenerative disc disease of the included lumbar spine. Fine bony detail is limited by patient body habitus. Right femur and hip appear intact. No disruption of the sacroiliac joints and pubic symphysis. The arcuate lines of the sacrum appear intact. IMPRESSION: 1. There is an acute, closed, comminuted, varus angulated intertrochanteric fracture of the left femur with fractures undermining the greater  trochanter and with slight avulsion of the lesser trochanter. 2. Mild lumbar spondylosis. 3. Intact bony pelvis without diastasis. Electronically Signed   By: Ashley Royalty M.D.   On: 06/20/2017 22:06    Positive ROS: All other systems have been reviewed and were otherwise negative with the exception of those mentioned in the HPI and as above.  Labs cbc  Recent Labs  06/20/17 2054 06/20/17 2057  WBC 5.5  --   HGB 9.9* 10.2*  HCT 31.1* 30.0*  PLT 121*  --     Labs inflam No results for input(s): CRP in the last 72 hours.  Invalid input(s): ESR  Labs coag No results for input(s): INR, PTT in the last 72 hours.  Invalid input(s): PT   Recent Labs  06/20/17 2057  NA 143  K 3.6  CL 103  GLUCOSE 120*  BUN 45*  CREATININE 1.80*    Physical Exam: Vitals:   06/21/17 0215 06/21/17 0401  BP: (!) 95/50 (!) 83/54  Pulse: 61 (!) 51  Resp:  16  Temp:  (!) 97.5 F (36.4 C)  SpO2: 98% 93%   General: Alert, no acute distress Cardiovascular: No pedal edema Respiratory: No cyanosis, no use of accessory musculature GI: No organomegaly, abdomen is soft and non-tender Skin: No lesions in the area of chief complaint other than those listed below in MSK exam.  Neurologic: Sensation intact distally save for the below mentioned MSK exam Psychiatric: Patient is competent for consent with normal mood and affect Lymphatic: No axillary or cervical lymphadenopathy  MUSCULOSKELETAL:  LLE: NVI, compartments soft Other extremities are atraumatic with painless ROM and NVI.  Assessment: Left IT hip fracture  Plan: IM nail today   Renette Butters, MD Cell [phone]   06/21/2017 6:44 AM

## 2017-06-22 ENCOUNTER — Inpatient Hospital Stay (HOSPITAL_COMMUNITY): Payer: Medicare Other

## 2017-06-22 ENCOUNTER — Encounter (HOSPITAL_COMMUNITY): Payer: Self-pay | Admitting: Orthopedic Surgery

## 2017-06-22 DIAGNOSIS — E1122 Type 2 diabetes mellitus with diabetic chronic kidney disease: Secondary | ICD-10-CM

## 2017-06-22 DIAGNOSIS — N183 Chronic kidney disease, stage 3 (moderate): Secondary | ICD-10-CM

## 2017-06-22 DIAGNOSIS — I959 Hypotension, unspecified: Secondary | ICD-10-CM

## 2017-06-22 LAB — TROPONIN I: TROPONIN I: 0.11 ng/mL — AB (ref <0.03)

## 2017-06-22 LAB — ECHOCARDIOGRAM COMPLETE
AV Area VTI: 1.29 cm2
AV Area mean vel: 1.26 cm2
AV Mean grad: 8 mmHg
AV Peak grad: 14 mmHg
AV VEL mean LVOT/AV: 0.37
AV area mean vel ind: 0.55 cm2/m2
AV peak Index: 0.56
AV vel: 1.5
AVAREAVTIIND: 0.66 cm2/m2
AVPKVEL: 184 cm/s
Ao pk vel: 0.37 m/s
CHL CUP AV VALUE AREA INDEX: 0.66
CHL CUP DOP CALC LVOT VTI: 16.8 cm
CHL CUP MV DEC (S): 301
DOP CAL AO MEAN VELOCITY: 130 cm/s
EERAT: 9.69
EWDT: 301 ms
FS: 10 % — AB (ref 28–44)
Height: 68 in
IV/PV OW: 0.82
LA ID, A-P, ES: 50 mm
LA diam index: 2.18 cm/m2
LA vol A4C: 75.7 ml
LEFT ATRIUM END SYS DIAM: 50 mm
LV PW d: 17 mm — AB (ref 0.6–1.1)
LV TDI E'MEDIAL: 7.62
LVEEAVG: 9.69
LVEEMED: 9.69
LVELAT: 9.46 cm/s
LVOT MV VTI INDEX: 0.74 cm2/m2
LVOT MV VTI: 1.7
LVOT area: 3.46 cm2
LVOTD: 21 mm
LVOTPV: 68.6 cm/s
LVOTSV: 58 mL
LVOTVTI: 0.43 cm
Lateral S' vel: 8.38 cm/s
MV Annulus VTI: 34.2 cm
MV M vel: 55
MV Peak grad: 3 mmHg
MV pk A vel: 32.1 m/s
MV pk E vel: 91.7 m/s
Mean grad: 2 mmHg
RV TAPSE: 14.6 mm
TDI e' lateral: 9.46
VTI: 38.7 cm
Valve area: 1.5 cm2
WEIGHTICAEL: 3717.84 [oz_av]

## 2017-06-22 LAB — CBC
HEMATOCRIT: 31.4 % — AB (ref 39.0–52.0)
Hemoglobin: 9.9 g/dL — ABNORMAL LOW (ref 13.0–17.0)
MCH: 28.9 pg (ref 26.0–34.0)
MCHC: 31.5 g/dL (ref 30.0–36.0)
MCV: 91.8 fL (ref 78.0–100.0)
Platelets: 129 10*3/uL — ABNORMAL LOW (ref 150–400)
RBC: 3.42 MIL/uL — AB (ref 4.22–5.81)
RDW: 16.7 % — AB (ref 11.5–15.5)
WBC: 6.1 10*3/uL (ref 4.0–10.5)

## 2017-06-22 LAB — URINALYSIS, ROUTINE W REFLEX MICROSCOPIC
BILIRUBIN URINE: NEGATIVE
GLUCOSE, UA: NEGATIVE mg/dL
Ketones, ur: NEGATIVE mg/dL
NITRITE: NEGATIVE
PH: 5 (ref 5.0–8.0)
Protein, ur: NEGATIVE mg/dL
SPECIFIC GRAVITY, URINE: 1.016 (ref 1.005–1.030)
Squamous Epithelial / LPF: NONE SEEN

## 2017-06-22 LAB — BASIC METABOLIC PANEL
ANION GAP: 12 (ref 5–15)
BUN: 57 mg/dL — AB (ref 6–20)
CHLORIDE: 106 mmol/L (ref 101–111)
CO2: 23 mmol/L (ref 22–32)
Calcium: 8.3 mg/dL — ABNORMAL LOW (ref 8.9–10.3)
Creatinine, Ser: 2.24 mg/dL — ABNORMAL HIGH (ref 0.61–1.24)
GFR, EST AFRICAN AMERICAN: 28 mL/min — AB (ref >60)
GFR, EST NON AFRICAN AMERICAN: 24 mL/min — AB (ref >60)
Glucose, Bld: 202 mg/dL — ABNORMAL HIGH (ref 65–99)
POTASSIUM: 4 mmol/L (ref 3.5–5.1)
SODIUM: 141 mmol/L (ref 135–145)

## 2017-06-22 LAB — GLUCOSE, CAPILLARY
GLUCOSE-CAPILLARY: 168 mg/dL — AB (ref 65–99)
GLUCOSE-CAPILLARY: 139 mg/dL — AB (ref 65–99)
GLUCOSE-CAPILLARY: 125 mg/dL — AB (ref 65–99)
Glucose-Capillary: 172 mg/dL — ABNORMAL HIGH (ref 65–99)
Glucose-Capillary: 128 mg/dL — ABNORMAL HIGH (ref 65–99)

## 2017-06-22 LAB — MAGNESIUM: Magnesium: 1.9 mg/dL (ref 1.7–2.4)

## 2017-06-22 LAB — PHOSPHORUS: PHOSPHORUS: 4.5 mg/dL (ref 2.5–4.6)

## 2017-06-22 MED ORDER — HEPARIN SODIUM (PORCINE) 5000 UNIT/ML IJ SOLN
5000.0000 [IU] | Freq: Three times a day (TID) | INTRAMUSCULAR | Status: DC
  Administered 2017-06-22 – 2017-06-23 (×4): 5000 [IU] via SUBCUTANEOUS
  Filled 2017-06-22 (×4): qty 1

## 2017-06-22 MED ORDER — INSULIN ASPART 100 UNIT/ML ~~LOC~~ SOLN
0.0000 [IU] | Freq: Three times a day (TID) | SUBCUTANEOUS | Status: DC
Start: 2017-06-22 — End: 2017-06-28
  Administered 2017-06-22 – 2017-06-27 (×8): 2 [IU] via SUBCUTANEOUS
  Administered 2017-06-27: 3 [IU] via SUBCUTANEOUS
  Administered 2017-06-28: 5 [IU] via SUBCUTANEOUS

## 2017-06-22 MED ORDER — DOCUSATE SODIUM 100 MG PO CAPS
100.0000 mg | ORAL_CAPSULE | Freq: Two times a day (BID) | ORAL | Status: DC
  Administered 2017-06-22 – 2017-06-28 (×13): 100 mg via ORAL
  Filled 2017-06-22 (×14): qty 1

## 2017-06-22 MED ORDER — ATROPINE SULFATE 1 MG/10ML IJ SOSY
PREFILLED_SYRINGE | INTRAMUSCULAR | Status: AC
  Filled 2017-06-22: qty 10

## 2017-06-22 NOTE — NC FL2 (Signed)
[place] MEDICAID FL2 LEVEL OF CARE SCREENING TOOL     IDENTIFICATION  Patient Name: [name] [name] Birthdate: 07-Sep-1925 Sex: male Admission Date (Current Location): 06/20/2017  [facility] and [place] Number:  Producer, television/film/video and Address:  [facility] [place], [address], [place], [place] [address]      Provider Number: [id number]  Attending Physician Name and Address:  Roslynn Amble, MD  Relative Name and Phone Number:       Current Level of Care: Hospital Recommended Level of Care: Skilled Nursing Facility Prior Approval Number:    Date Approved/Denied:   PASRR Number:   [id number]   Discharge Plan: SNF    Current Diagnoses: Patient Active Problem List   Diagnosis Date Noted  . Closed left hip fracture, initial encounter (HCC) 06/21/2017  . Hypotension 06/21/2017  . Syncope 06/21/2017  . Anemia 06/21/2017  . Elevated troponin 06/21/2017  . Closed fracture of left hip (HCC)   . Acute on chronic diastolic heart failure (HCC) 12/01/2016  . Atrial fibrillation (HCC) 02/24/2016  . Bilateral leg weakness 09/17/2015  . Chest pain   . DM (diabetes mellitus), type 2 with renal complications (HCC)   . Hypertension   . Hyperlipidemia   . Prostatic hypertrophy, benign, with obstruction   . Sick sinus syndrome (HCC)   . Obesity   . Microalbuminuria   . Glaucoma   . Renal calculus   . Osteoarthritis   . Chronic kidney disease, stage III (moderate)   . BPH with elevated PSA   . ED (erectile dysfunction)   . RECTAL BLEEDING 04/22/2008  . DYSPHAGIA 04/22/2008  . HYPERLIPIDEMIA 04/18/2008  . HTN (hypertension) 04/18/2008  . ESOPHAGEAL STRICTURE 04/18/2008  . GERD 04/18/2008  . HIATAL HERNIA 04/18/2008  . DIVERTICULOSIS, COLON 04/18/2008  . COLONIC POLYPS, HYPERPLASTIC, HX OF 04/18/2008    Orientation RESPIRATION BLADDER Height & Weight     Self, Time, Situation, Place  O2 (Nasal Cannula 3L.) Continent Weight: 232 lb 5.8 oz  (105.4 kg) Height:  5\' 8"  (172.7 cm)  BEHAVIORAL SYMPTOMS/MOOD NEUROLOGICAL BOWEL NUTRITION STATUS      Continent Diet (see discharge summary.)  AMBULATORY STATUS COMMUNICATION OF NEEDS Skin   Limited Assist Verbally Surgical wounds (right hip.)                       Personal Care Assistance Level of Assistance  Bathing, Feeding, Dressing Bathing Assistance: Limited assistance Feeding assistance: Limited assistance Dressing Assistance: Limited assistance     Functional Limitations Info  Sight, Hearing, Speech Sight Info: Adequate Hearing Info: Adequate Speech Info: Adequate    SPECIAL CARE FACTORS FREQUENCY  PT (By licensed PT), OT (By licensed OT)     PT Frequency: 3 times a week.  OT Frequency: 3 times a week.            Contractures Contractures Info: Not present    Additional Factors Info  Code Status, Allergies Code Status Info: DNR Allergies Info: NKA           Current Medications (06/22/2017):  This is the current hospital active medication list Current Facility-Administered Medications  Medication Dose Route Frequency Provider Last Rate Last Dose  . 0.9 %  sodium chloride infusion   Intravenous Continuous Rodolph Bong, MD      . allopurinol (ZYLOPRIM) tablet 100 mg  100 mg Oral Daily Lyda Perone M, DO      . atorvastatin (LIPITOR) tablet 40 mg  40 mg Oral q1800 Hillary Bow, DO   40 mg at 06/21/17 1820  . atropine 1 MG/10ML injection           . brinzolamide (AZOPT) 1 % ophthalmic suspension 1 drop  1 drop Both Eyes TID Hillary Bow, DO   1 drop at 06/21/17 2220  . fentaNYL (SUBLIMAZE) injection 12.5 mcg  12.5 mcg Intravenous Q2H PRN Tobey Grim, NP      . finasteride (PROSCAR) tablet 5 mg  5 mg Oral Daily Lyda Perone M, DO      . HYDROcodone-acetaminophen (NORCO/VICODIN) 5-325 MG per tablet 1-2 tablet  1-2 tablet Oral Q6H PRN Hillary Bow, DO   2 tablet at 06/21/17 0404  . insulin aspart (novoLOG) injection 2-6 Units   2-6 Units Subcutaneous Q4H Tobey Grim, NP   4 Units at 06/22/17 0347  . lactated ringers infusion   Intravenous Continuous Albina Billet III, PA-C 50 mL/hr at 06/21/17 2037    . ondansetron (ZOFRAN) injection 4 mg  4 mg Intravenous Q6H PRN Hillary Bow, DO   4 mg at 06/21/17 2224  . phenylephrine (NEO-SYNEPHRINE) 10 mg in sodium chloride 0.9 % 250 mL (0.04 mg/mL) infusion  0-400 mcg/min Intravenous Titrated Erin Fulling, MD   Stopped at 06/21/17 2219  . sodium chloride 0.9 % bolus 500 mL  500 mL Intravenous Once Rodolph Bong, MD      . tamsulosin [facility]) capsule 0.4 mg  0.4 mg Oral Daily Lyda Perone M, DO      . Travoprost (BAK Free) (TRAVATAN) 0.004 % ophthalmic solution SOLN 1 drop  1 drop Both Eyes QHS Lyda Perone M, DO   1 drop at 06/21/17 2220     Discharge Medications: Please see discharge summary for a list of discharge medications.  Relevant Imaging Results:  Relevant Lab Results:   Additional Information SSN- [id number]  Robb Matar, LCSWA

## 2017-06-22 NOTE — Progress Notes (Signed)
   Assessment / Plan: 1 Day Post-Op  S/P Procedure(s) (LRB): INTRAMEDULLARY (IM) NAIL INTERTROCHANTRIC Left Leg (Left) by Dr. Jewel Baize. [name] [name] on 06/21/17  Principal Problem:   Hypotension Active Problems:   GERD   DM (diabetes mellitus), type 2 with renal complications (HCC)   Hypertension   Hyperlipidemia   Chronic kidney disease, stage III (moderate)   Atrial fibrillation (HCC)   Closed left hip fracture, initial encounter (HCC)   Syncope   Anemia   Elevated troponin   Closed fracture of left hip (HCC)  Closed left intertrochanteric Femur Fracture:  Mobilize with therapy when able Continue care per Medicine / CCM Okay to restart Eliquis from an orthopedic perspective Incentive Spirometry Apply ice prn  Weight Bearing: Weight Bearing as Tolerated (WBAT) LLE Dressings: PRN Mepilex.  VTE prophylaxis: Eliquis, SCDs, ambulation Dispo: Skilled Nursing Facility/Rehab   Subjective: "Generally feeling bad" but not in pain.  Not yet OOB.  Objective:   VITALS:   Vitals:   06/22/17 0500 06/22/17 0530 06/22/17 0555 06/22/17 0600  BP: (!) 173/146 (!) 88/50 (!) 97/57 (!) 98/58  Pulse: (!) 53 (!) 53 63 60  Resp: 15 13 15 13   Temp:      TempSrc:      SpO2: 98% 93% 96% 93%  Weight: 105.4 kg (232 lb 5.8 oz)     Height:       CBC Latest Ref Rng & Units 06/21/2017 06/21/2017 06/20/2017  WBC 4.0 - 10.5 K/uL 6.1 9.0 -  Hemoglobin 13.0 - 17.0 g/dL 8.8(T) 10.9(L) 10.2(L)  Hematocrit 39.0 - 52.0 % 31.4(L) 34.9(L) 30.0(L)  Platelets 150 - 400 K/uL 129(L) 144(L) -   BMP Latest Ref Rng & Units 06/21/2017 06/21/2017 06/20/2017  Glucose 65 - 99 mg/dL 254(D) 826(E) 158(X)  BUN 6 - 20 mg/dL 09(M) 07(W) 80(S)  Creatinine 0.61 - 1.24 mg/dL 8.11(S) 3.15(X) 4.58(P)  Sodium 135 - 145 mmol/L 141 142 143  Potassium 3.5 - 5.1 mmol/L 4.0 4.4 3.6  Chloride 101 - 111 mmol/L 106 106 103  CO2 22 - 32 mmol/L 23 23 -  Calcium 8.9 - 10.3 mg/dL 8.3(L) 8.5(L) -   Intake/Output      08/22 0701 - 08/23  0700   P.O. 200   I.V. (mL/kg) 2877.2 (27.3)   IV Piggyback 900   Total Intake(mL/kg) 3977.2 (37.7)   Urine (mL/kg/hr) 455 (0.2)   Blood 300   Total Output 755   Net +3222.2         Physical Exam: General: NAD.  Supine in bed on arrival.  Calm, conversant.  Forney in place.  No increased wob. MSK Left LE Bandages in place - c/d/i. Sensation intact distally Feet warm Dorsiflexion/Plantar flexion intact   Albina Billet III, PA-C 06/22/2017, 6:51 AM

## 2017-06-22 NOTE — Clinical Social Work Note (Signed)
Clinical Social Work Assessment  Patient Details  Name: [name] [name] MRN: [id number] Date of Birth: 13-Nov-1924  Date of referral:  06/22/17               Reason for consult:  Facility Placement                Permission sought to share information with:  Family Supports Permission granted to share information::  Yes, Verbal Permission Granted  Name::     Gaston Islam  (daughter of pt. )  Agency::     Relationship::     Contact Information:     Housing/Transportation Living arrangements for the past 2 months:  Single Family Home (alone. ) Source of Information:  Adult Children Patient Interpreter Needed:  None Criminal Activity/Legal Involvement Pertinent to Current Situation/Hospitalization:  No - Comment as needed Significant Relationships:  Adult Children, Community Support Lives with:  Self Do you feel safe going back to the place where you live?  Yes Need for family participation in patient care:  Yes (Comment)  Care giving concerns:  CSW spoke with pt's daughter via phone per request of daughter. At this time pt's daughter does not have any concerns.    Social Worker assessment / plan:  CSW spoke with pt's daughter [name] via phone. During this time CSW was informed that pt lived at home alone after pt's wife passed away in 2017-01-17. Pt has support from daughter and other family members and friends. Pt's daughter informed CSW that she has been in contact with Tresa Endo at [facility] regarding rehab for pt after discharge from the hospital. CSW was asked to follow with her Tresa Endo and CSW was informed that she is able to take pt. CSW will began the process of placement for pt at this time.   [name] mentions that the plan for pt after rehab is to return back home with potential HH if able to.   Employment status:  Retired Health and safety inspector:  Medicare PT Recommendations:  Not assessed at this time Information / Referral to community resources:  Skilled Nursing  Facility  Patient/Family's Response to care:  Pt's daughter is agreeable and understanding of plan of care for pt at this time.   Patient/Family's Understanding of and Emotional Response to Diagnosis, Current Treatment, and Prognosis:  No further questions or concerns have been presented at this time.   Emotional Assessment Appearance:  Appears stated age Attitude/Demeanor/Rapport:  Unable to Assess Affect (typically observed):  Unable to Assess Orientation:  Oriented to Self, Oriented to Place, Oriented to  Time, Oriented to Situation Alcohol / Substance use:  Not Applicable Psych involvement (Current and /or in the community):  No (Comment)  Discharge Needs  Concerns to be addressed:  No discharge needs identified Readmission within the last 30 days:  No Current discharge risk:  None Barriers to Discharge:  No Barriers Identified   Robb Matar, LCSWA 06/22/2017, 8:33 AM

## 2017-06-22 NOTE — Progress Notes (Signed)
PULMONARY / CRITICAL CARE MEDICINE   Name: [name] [name] MRN: [id number] DOB: 21-Sep-1925    ADMISSION DATE:  06/20/2017 CONSULTATION DATE:  06/21/2017  REFERRING MD:  Dr. Janee Morn   CHIEF COMPLAINT:  Hypotension in post-operative state   HISTORY OF PRESENT ILLNESS:   83 year old male with PMH of CKD stage 3, DM, CHF, BPH, HTN, HLD, Obesity   Presents to ED on 8/21 after syncopal episode leading to a fall. Upon arrival to ED systolic 70-80. Patient noted to have left hip fracture and on 8/22 underwent IM nail to the left hip by orthopedics. Postoperatively noted to be hypotensive and requiring pressors. Estimated 300 ml blood loss, given 500 ml fluid bolus. PCCM consulted.   SUBJECTIVE:   Episodes of bradycardia overnight but more alert and interactive this AM  VITAL SIGNS: BP 101/65 (BP Location: Left Arm)   Pulse 61   Temp (!) 96.2 F (35.7 C) (Axillary)   Resp 12   Ht 5\' 8"  (1.727 m)   Wt 105.4 kg (232 lb 5.8 oz)   SpO2 95%   BMI 35.33 kg/m   HEMODYNAMICS:    VENTILATOR SETTINGS:    INTAKE / OUTPUT: I/O last 3 completed shifts: In: 5597.2 [P.O.:320; I.V.:4377.2; IV Piggyback:900] Out: 955 [Urine:655; Blood:300]  PHYSICAL EXAMINATION: General:  Elderly male, no distress  Neuro:  Alert and interactive, moving all ext to command HEENT:  [place]/AT, PERRL, EOM-I and MMM Cardiovascular: RRR, Nl S1/S2, -M/R/G. Lungs:  Clear breath sounds, non-labored  Abdomen:  Obese, active bowel sounds  Musculoskeletal:  +2 BLE  Skin:  Warm, dry > dressing noted to left hip   LABS:  BMET:  Recent Labs Lab 06/20/17 2057 06/21/17 1821 06/21/17 2353  NA 143 142 141  K 3.6 4.4 4.0  CL 103 106 106  CO2  --  23 23  BUN 45* 56* 57*  CREATININE 1.80* 2.27* 2.24*  GLUCOSE 120* 187* 202*    Electrolytes  Recent Labs Lab 06/21/17 1821 06/21/17 2353  CALCIUM 8.5* 8.3*  MG  --  1.9  PHOS  --  4.5    CBC  Recent Labs Lab 06/20/17 2054 06/20/17 2057 06/21/17 1252  06/21/17 2353  WBC 5.5  --  9.0 6.1  HGB 9.9* 10.2* 10.9* 9.9*  HCT 31.1* 30.0* 34.9* 31.4*  PLT 121*  --  144* 129*    Coag's No results for input(s): APTT, INR in the last 168 hours.  Sepsis Markers  Recent Labs Lab 06/20/17 2058  LATICACIDVEN 2.11*    ABG No results for input(s): PHART, PCO2ART, PO2ART in the last 168 hours.  Liver Enzymes  Recent Labs Lab 06/21/17 0404  AST 26  ALT 16*  ALKPHOS 75  BILITOT 1.7*  ALBUMIN 3.0*    Cardiac Enzymes  Recent Labs Lab 06/21/17 1821 06/21/17 2353  TROPONINI 0.11* 0.11*    Glucose  Recent Labs Lab 06/21/17 0921 06/21/17 1536 06/21/17 1909 06/21/17 2309 06/22/17 0259 06/22/17 0721  GLUCAP 140* 159* 201* 186* 172* 168*    Imaging Dg Chest Port 1 View  Result Date: 06/21/2017 CLINICAL DATA:  Hypotension EXAM: PORTABLE CHEST 1 VIEW COMPARISON:  06/20/2017 FINDINGS: Cardiomegaly. Low lung volumes with bibasilar atelectasis. Mild vascular congestion. No effusions. Old healed left rib fractures. IMPRESSION: Low lung volumes with bibasilar atelectasis. Mild vascular congestion. Electronically Signed   By: Charlett Nose M.D.   On: 06/21/2017 10:54   Dg Hip Port Unilat With Pelvis 1v Left  Result Date: 06/21/2017 CLINICAL  DATA:  Postop left intertrochanteric fracture EXAM: DG HIP (WITH OR WITHOUT PELVIS) 1V PORT LEFT COMPARISON:  None. FINDINGS: Internal fixation changes across the left femoral intertrochanteric fracture. There remains mild displacement of the greater and lesser trochanters. No subluxation or dislocation. No hardware complicating feature. IMPRESSION: Internal fixation across the left femoral intertrochanteric fracture with mild continued displacement as above. Electronically Signed   By: Charlett Nose M.D.   On: 06/21/2017 10:53     STUDIES:  CXR 8/21 > Aortic atherosclerosis. Stable central pulmonary vascular congestion. No acute abnormality seen Hip Xray 8/21 > There is an acute, closed,  comminuted, varus angulated intertrochanteric fracture of the left femur with fractures undermining the greater trochanter and with slight avulsion of the lesser trochanter. Mild lumbar spondylosis. Intact bony pelvis without diastasis. CXR 8/22 > Low lung volumes with bibasilar atelectasis, mild vascular congestion   CULTURES: MRSA PCR 8/22 > Negative   ANTIBIOTICS: Ancef 8/22 >>  SIGNIFICANT EVENTS: 8/21 > Present to ED 8/22 > To OR for left hip nail placement   LINES/TUBES: PIV   I reviewed CXR myself, no acute disease noted  DISCUSSION: 83 year old male with extensive health history presents 8/21 post-fall related to syncopal episode, found to have left hip fracture. 8/22 OR for nail placement. Hypotensive requiring Neo gtt.   ASSESSMENT / PLAN:  PULMONARY A: Respiratory Insufficiency in post-operative state  P:   Wean Fi02 for Saturation >92 Pulmonary Hygiene   CARDIOVASCULAR A:  Hypotension secondary to hypovolemia vs sedation s/p 1L NS  Elevated Troponin  -0.10 >>  H/O A.Fib, Sick Sinus Syndrome, HLD, HTN  P:  Cardiac Monitoring  D/C neo Continue Lipitor  Hold diuretics and hypertensive medications since BP remains marginal  RENAL A:   CKD stage 4 with cardiorenal syndrome  H/O BPH P:   Trend BMP Replace Electrolytes as indicated   Continue Flomax  KVO IVF  GASTROINTESTINAL A:   No issues  P:   Heart healthy diet PPI  HEMATOLOGIC A:   On Chronic Anticoagulation  P:  Trend CBC  Hold Eliquis   INFECTIOUS A:   No issues  P:   Trend WBC and Fever Curve Ancef post-op   ENDOCRINE A:   DM   P:   Trend Glucose  SSI  Cortisol 12.8  NEUROLOGIC A:   Acute Pain  P:   Monitor  Norco and Fentanyl  PRN  Minimize narcotics  FAMILY  - Updates: Patient updated bedside.  Discussed with TRH-MD  - Inter-disciplinary family meet or Palliative Care meeting due by:  06/28/2017  Transfer to tele and to [facility] service with PCCM off 8/24  Alyson Reedy, M.D. [facility] Pulmonary/Critical Care Medicine. Pager: [phone]. After hours pager: [phone].

## 2017-06-22 NOTE — Progress Notes (Signed)
Nutrition Brief Note  RD consulted via hip fracture protocol   Wt Readings from Last 15 Encounters:  06/22/17 238 lb 8 oz (108.2 kg)  04/18/17 223 lb (101.2 kg)  03/20/17 208 lb 6.4 oz (94.5 kg)  01/17/17 200 lb (90.7 kg)  12/01/16 222 lb 12.8 oz (101.1 kg)  08/01/16 210 lb 8.6 oz (95.5 kg)  05/18/16 215 lb 6.4 oz (97.7 kg)  04/07/16 214 lb 1.1 oz (97.1 kg)  02/24/16 219 lb 6.4 oz (99.5 kg)  12/17/15 210 lb 1.9 oz (95.3 kg)  12/10/15 215 lb (97.5 kg)  10/01/15 212 lb (96.2 kg)  09/17/15 212 lb 3.2 oz (96.3 kg)  04/22/08 (!) 230 lb 3 oz (104.4 kg)    Body mass index is 36.26 kg/m. Patient meets criteria for obese based on current BMI.   Current diet order is heart healthy/carb modified, patient is consuming approximately 100% of meals at this time. Family at bedside states pt had good po intake PTA as well.   Pt and pt family report no recent weight loss, other than fluid.   Nutrition-focused physical exam completed. Findings included no depletions.   Labs and medications reviewed.   No nutrition interventions warranted at this time. If nutrition issues arise, please consult RD.   Fransisca Kaufmann, [place] 06/22/2017 3:31 PM

## 2017-06-22 NOTE — Progress Notes (Signed)
  Echocardiogram 2D Echocardiogram has been performed.  Delcie Roch 06/22/2017, 9:37 AM

## 2017-06-23 DIAGNOSIS — D509 Iron deficiency anemia, unspecified: Secondary | ICD-10-CM

## 2017-06-23 DIAGNOSIS — S72002S Fracture of unspecified part of neck of left femur, sequela: Secondary | ICD-10-CM

## 2017-06-23 LAB — BASIC METABOLIC PANEL
ANION GAP: 10 (ref 5–15)
BUN: 72 mg/dL — AB (ref 6–20)
CHLORIDE: 107 mmol/L (ref 101–111)
CO2: 24 mmol/L (ref 22–32)
Calcium: 8.6 mg/dL — ABNORMAL LOW (ref 8.9–10.3)
Creatinine, Ser: 2.15 mg/dL — ABNORMAL HIGH (ref 0.61–1.24)
GFR calc Af Amer: 30 mL/min — ABNORMAL LOW (ref >60)
GFR, EST NON AFRICAN AMERICAN: 26 mL/min — AB (ref >60)
GLUCOSE: 81 mg/dL (ref 65–99)
POTASSIUM: 4 mmol/L (ref 3.5–5.1)
SODIUM: 141 mmol/L (ref 135–145)

## 2017-06-23 LAB — CBC
HCT: 30.5 % — ABNORMAL LOW (ref 39.0–52.0)
HEMOGLOBIN: 9.7 g/dL — AB (ref 13.0–17.0)
MCH: 28.6 pg (ref 26.0–34.0)
MCHC: 31.8 g/dL (ref 30.0–36.0)
MCV: 90 fL (ref 78.0–100.0)
PLATELETS: 142 10*3/uL — AB (ref 150–400)
RBC: 3.39 MIL/uL — AB (ref 4.22–5.81)
RDW: 16.6 % — ABNORMAL HIGH (ref 11.5–15.5)
WBC: 6.8 10*3/uL (ref 4.0–10.5)

## 2017-06-23 LAB — GLUCOSE, CAPILLARY
GLUCOSE-CAPILLARY: 87 mg/dL (ref 65–99)
GLUCOSE-CAPILLARY: 131 mg/dL — AB (ref 65–99)
GLUCOSE-CAPILLARY: 128 mg/dL — AB (ref 65–99)
GLUCOSE-CAPILLARY: 122 mg/dL — AB (ref 65–99)

## 2017-06-23 LAB — PHOSPHORUS: Phosphorus: 3.9 mg/dL (ref 2.5–4.6)

## 2017-06-23 LAB — MAGNESIUM: MAGNESIUM: 2.1 mg/dL (ref 1.7–2.4)

## 2017-06-23 MED ORDER — POTASSIUM CHLORIDE CRYS ER 10 MEQ PO TBCR
10.0000 meq | EXTENDED_RELEASE_TABLET | Freq: Every day | ORAL | Status: DC
  Administered 2017-06-23 – 2017-06-26 (×4): 10 meq via ORAL
  Filled 2017-06-23 (×7): qty 1

## 2017-06-23 MED ORDER — TORSEMIDE 20 MG PO TABS
20.0000 mg | ORAL_TABLET | Freq: Two times a day (BID) | ORAL | Status: DC
  Administered 2017-06-23 – 2017-06-25 (×4): 20 mg via ORAL
  Filled 2017-06-23 (×4): qty 1

## 2017-06-23 MED ORDER — SILDENAFIL CITRATE 20 MG PO TABS
20.0000 mg | ORAL_TABLET | Freq: Three times a day (TID) | ORAL | Status: DC
  Administered 2017-06-23 – 2017-06-28 (×15): 20 mg via ORAL
  Filled 2017-06-23 (×17): qty 1

## 2017-06-23 MED ORDER — APIXABAN 2.5 MG PO TABS
2.5000 mg | ORAL_TABLET | Freq: Two times a day (BID) | ORAL | Status: DC
  Administered 2017-06-23 – 2017-06-28 (×11): 2.5 mg via ORAL
  Filled 2017-06-23 (×11): qty 1

## 2017-06-23 NOTE — Consult Note (Signed)
CARDIOLOGY CONSULT NOTE  Patient ID: [name] [name] MRN: [id number] DOB/AGE: [birth year] 83 y.o.  Admit date: 06/20/2017 Referring Physician  Jerelyn Charles Primary Physician:  Gaspar Garbe, MD Reason for Consultation  CHF  HPI: [name] [name]  is a 83 y.o. male  With I have known him for a long time, she is no significant coronary artery disease but an occluded RCA that is old by recent coronary angiography about 2 months ago when he presented with recurrent episodes of congestive heart failure. He still continues to remain fairly active, has had severe acute decompensated diastolic heart failure recently and mostly felt to be due to his dietary changes. He lives by himself.  He was admitted to the hospital on 06/20/2017 after he walked home from his neighbor and suddenly felt dizzy and had syncope and has a broken left hip for which he underwent arthroplasty on 06/21/2017. I am now seeing the patient to manage diastolic heart failure.  Patient has had anasarca over the past 2-3 months, and was admitted by me in May 2018 and with aggressive diuresis had done well and was discharged home but unfortunately gained all the fluid back. He has severe abdominal distention, scrotal swelling and also leg edema. Denies any chest pain. Has severe shortness of breath.  Past Medical History:  Diagnosis Date  . BPH with elevated PSA   . Chest pain   . Chronic kidney disease, stage III (moderate)   . DM (diabetes mellitus), type 2 with renal complications (HCC)   . ED (erectile dysfunction)   . Glaucoma   . Hyperlipidemia   . Hypertension   . Microalbuminuria   . Microalbuminuria 2013  . Obesity   . Osteoarthritis   . Prostatic hypertrophy, benign, with obstruction   . Renal calculus   . Sick sinus syndrome [facility])      Past Surgical History:  Procedure Laterality Date  . INTRAMEDULLARY (IM) NAIL INTERTROCHANTERIC Left 06/21/2017   Procedure: INTRAMEDULLARY (IM) NAIL INTERTROCHANTRIC  Left Leg;  Surgeon: Sheral Apley, MD;  Location: [facility] OR;  Service: Orthopedics;  Laterality: Left;  . INTRAVASCULAR PRESSURE WIRE/FFR STUDY N/A 04/18/2017   Procedure: Intravascular Pressure Wire/FFR Study;  Surgeon: Yates Decamp, MD;  Location: [facility] INVASIVE CV LAB;  Service: Cardiovascular;  Laterality: N/A;  Prox LAD  . KNEE SURGERY    . REPLACEMENT TOTAL KNEE  02/26/01     Family History  Problem Relation Age of Onset  . Cancer Paternal Grandfather        STOMACH  . Cancer Sister        MALE CANCER  . Cancer - Colon Sister   . Stroke Father   . Healthy Child   . Healthy Child   . Other Child        BRAIN DAMAGE AT BIRTH     Social History: Social History   Social History  . Marital status: Married    Spouse name: N/A  . Number of children: N/A  . Years of education: N/A   Occupational History  . Not on file.   Social History Main Topics  . Smoking status: Former Smoker    Types: Cigarettes  . Smokeless tobacco: Never Used  . Alcohol use No  . Drug use: No  . Sexual activity: Not on file   Other Topics Concern  . Not on file   Social History Narrative  . No narrative on file     Prescriptions Prior to Admission  Medication Sig  Dispense Refill Last Dose  . allopurinol (ZYLOPRIM) 100 MG tablet Take 100 mg by mouth daily.   04/17/2017 at Unknown time  . brinzolamide (AZOPT) 1 % ophthalmic suspension Place 1 drop into both eyes at bedtime.    04/17/2017 at Unknown time  . ELIQUIS 5 MG TABS tablet Take 2.5 mg by mouth 2 (two) times daily.    04/16/2017 at 1100  . finasteride (PROSCAR) 5 MG tablet Take 5 mg by mouth daily.   04/18/2017 at 0530  . glimepiride (AMARYL) 4 MG tablet Take 0.5 tablets (2 mg total) by mouth daily.   04/16/2017  . isosorbide mononitrate (IMDUR) 60 MG 24 hr tablet Take 60 mg by mouth daily.   04/17/2017 at Unknown time  . losartan (COZAAR) 25 MG tablet Take 25 mg by mouth daily.     . metolazone (ZAROXOLYN) 5 MG tablet Take 5 mg by mouth every  morning.     . metoprolol succinate (TOPROL-XL) 25 MG 24 hr tablet Take 25 mg by mouth daily.  1   . MYRBETRIQ 50 MG TB24 tablet Take 50 mg by mouth daily.   04/17/2017 at Unknown time  . nitroGLYCERIN (NITROSTAT) 0.4 MG SL tablet Place 1 tablet (0.4 mg total) under the tongue every 5 (five) minutes as needed for chest pain. 25 tablet 6 PRN  . potassium chloride (K-DUR) 10 MEQ tablet Take 10 mEq by mouth daily.     Marland Kitchen RAPAFLO 8 MG CAPS capsule Take 8 mg by mouth daily.   04/17/2017 at Unknown time  . simvastatin (ZOCOR) 80 MG tablet Take 40 mg by mouth daily at 6 PM.    04/17/2017 at Unknown time  . torsemide (DEMADEX) 20 MG tablet Take 1 tablet (20 mg total) by mouth 2 (two) times daily. (Patient taking differently: Take 10 mg by mouth 2 (two) times daily. ) 30 tablet 0 04/16/2017  . travoprost, benzalkonium, (TRAVATAN) 0.004 % ophthalmic solution Place 1 drop into both eyes at bedtime.   04/17/2017 at Unknown time   Review of Systems - Left hip pain still present. Complains of dyspnea and cough. Nonproductive. No fever or chills. Difficulty urinating, has chronic Foley catheter in place. Denies any neurologic deficits. Denies bowel disturbances. No melena. No headache or visual disturbances. Other systems are negative.   Physical Exam: Blood pressure 106/65, pulse 60, temperature (!) 97.3 F (36.3 C), temperature source Oral, resp. rate 18, height 5\' 8"  (1.727 m), weight 108.2 kg (238 lb 8 oz), SpO2 95 %.   General appearance: alert, cooperative, appears stated age and mild distress Neck: no adenopathy, no carotid bruit, supple, symmetrical, trachea midline, thyroid not enlarged, symmetric, no tenderness/mass/nodules and JVD elevated to the angle of the jaw Lungs: diminished breath sounds bibasilar and bilaterally Heart: irregularly irregular rhythm, S1, S2 normal and systolic murmur: systolic ejection 3/6, crescendo at 2nd right intercostal space Abdomen: Distended, ascites present, nontender, bowel  sounds heard in all 4 quadrants. Male genitalia: Marked scrotal edema present. Foley catheter in place. Extremities: edema 3+. Pulses: 2+ and symmetric Neurologic: Grossly normal  Labs:  Lab Results  Component Value Date   WBC 6.8 06/23/2017   HGB 9.7 (L) 06/23/2017   HCT 30.5 (L) 06/23/2017   MCV 90.0 06/23/2017   PLT 142 (L) 06/23/2017    Recent Labs Lab 06/21/17 0404  06/23/17 0712  NA  --   < > 141  K  --   < > 4.0  CL  --   < >  107  CO2  --   < > 24  BUN  --   < > 72*  CREATININE  --   < > 2.15*  CALCIUM  --   < > 8.6*  PROT 6.1*  --   --   BILITOT 1.7*  --   --   ALKPHOS 75  --   --   ALT 16*  --   --   AST 26  --   --   GLUCOSE  --   < > 81  < > = values in this interval not displayed.   Recent Labs  03/16/17 1825 03/17/17 0544 03/18/17 0416  BNP 622.7* 656.1* 734.5*   Cardiac Panel (last 3 results)  Recent Labs  06/21/17 1821 06/21/17 2353  TROPONINI 0.11* 0.11*     Radiology:  Scheduled Meds: . allopurinol  100 mg Oral Daily  . atorvastatin  40 mg Oral q1800  . brinzolamide  1 drop Both Eyes TID  . docusate sodium  100 mg Oral BID  . finasteride  5 mg Oral Daily  . heparin subcutaneous  5,000 Units Subcutaneous Q8H  . insulin aspart  0-15 Units Subcutaneous TID WC  . tamsulosin  0.4 mg Oral Daily  . Travoprost (BAK Free)  1 drop Both Eyes QHS   Continuous Infusions: . lactated ringers 10 mL/hr at 06/22/17 1546  . phenylephrine (NEO-SYNEPHRINE) Adult infusion Stopped (06/21/17 2219)  . sodium chloride     PRN Meds:.fentaNYL (SUBLIMAZE) injection, HYDROcodone-acetaminophen, ondansetron (ZOFRAN) IV  CARDIAC STUDIES:  EKG 06/21/2017: Atrial fibrillation with controlled ventricular response at a rate of 66 bpm, poor R-wave progression, low-voltage complexes, nonspecific T abnormality. Borderline prolonged QT interval.  ECHO: 06/22/2017: Left ventricle: The cavity size was mildly reduced. There was   moderate concentric hypertrophy. The  estimated ejection fraction   was in the range of 55% to 60%. The study is not technically   sufficient to allow evaluation of LV diastolic function. - Aortic valve: Mildly calcified annulus. Trileaflet; moderately   calcified leaflets. There was mild stenosis. Valve area (VTI):   1.5 cm. Valve area (Vmax): 1.29 cm. Valve area (Vmean): 1.26 cm- Mitral valve: Moderately calcified annulus. Valve area by  continuity equation (using LVOT flow): 1.7 cm^2. - Left atrium: The atrium was moderately dilated. - Right ventricle: The cavity size was mildly dilated. Wall   thickness was normal. - Right atrium: The atrium was moderately dilated. - Tricuspid valve: There was moderate regurgitation.Pulmonary arteries: PA peak pressure: 43 mm Hg (S).  ASSESSMENT AND PLAN:  1. Acute on chronic diastolic heart failure, most predominantly involves right-sided heart failure with markedly elevated JVD, evidence of IVC dilatation and pulmonary hypertension by echocardiogram and clinical evidence of right-sided heart failure with ascites and severe leg edema. 2. Coronary artery disease, occluded right coronary artery that is chronic, mid LAD 70% stenosis not hemodynamically significant by angiography on 04/18/2017. 3. Chronic stage IV kidney disease 4. Chronic atrial fibrillation, rate controlled. CHADVASC score 5, high risk, 6.7% 5. Diabetes mellitus type 2 controlled with stage IV CK D, with hyperglycemia.  Recommendation: He should be back on diuretics to improve his anasarca. I would like to try using Revatio to see if I can reduce his right-sided pressure empirically and whether this would make a difference in his overall right-sided heart failure symptoms.  Patient was living independently at home, but is now aware that he has to live in an assisted living facility. Continue anticoagulation, patient is presently on  heparin along for DVT prophylaxis, was on Eliquis in the outpatient basis.  Fall appears to be  from syncope, sick sinus syndrome needs to be kept in mind, he has had no recurrence in the hospital, we can consider outpatient event monitoring.  Yates Decamp, MD 06/23/2017, 12:22 PM [facility] [place] Pager: [phone] Office: [phone] If no answer Cell [phone]

## 2017-06-23 NOTE — Evaluation (Signed)
Physical Therapy Evaluation Patient Details Name: [name] [name] MRN: [id number] DOB: Oct 04, 1925 Today's Date: 06/23/2017   History of Present Illness  Pt is a 83 yo male admitted through ED on 06/20/17 following a syncopal episode related to hypotension resulting in a fall and L hip fx. Pt underwent an IM nail on 06/21/17. Pt has continued to have issues with hypotension which has limited his ability to begin mobility until today. PMH significant for CKD3, DM2, chornic diastolic HF, HLD, HTN.   Clinical Impression  Pt presents with the above diagnosis and below deficits for therapy evaluation. Prior to admission, pt lived alone in a single level home. This session, pt requires Mod to Max A for bed mobility and attempted transfers with increasing assistance required as pt fatigues. Due to current mobility, pt will benefit from SNF at discharge in order to maximize his outcomes. Pt continues to benefit from acute PT follow-up to address the below deficits prior to D/C.     Follow Up Recommendations SNF    Equipment Recommendations  None recommended by PT;Other (comment) (defer to next venue)    Recommendations for Other Services OT consult     Precautions / Restrictions Precautions Precautions: Fall Precaution Comments: Watch orthostatics Restrictions Weight Bearing Restrictions: Yes LLE Weight Bearing: Weight bearing as tolerated      Mobility  Bed Mobility Overal bed mobility: Needs Assistance Bed Mobility: Supine to Sit;Sit to Supine     Supine to sit: Mod assist Sit to supine: Max assist;+2 for physical assistance   General bed mobility comments: Max A +2 for sit to supine due to pt being very fatigued. Mod A to sit upright at EOB with assistance with LE's and to bring trunk upright at EB.   Transfers Overall transfer level: Needs assistance Equipment used: Rolling walker (2 wheeled);None Transfers: Sit to/from [employer] Sit to Stand: Max  assist Stand pivot transfers:  (unable)       General transfer comment: Attempted sit to stand and stand pivot to recliner. Pt is able to stand x1 attempt with RW, but is unable to pivot on LLE to sit in recliner. Attempted stand pivot x 3 with no success and no help available at this time. Pt became very fatigued and RN assisted pt back to bed.   Ambulation/Gait             General Gait Details: Not assessed this session  Stairs            Wheelchair Mobility    Modified Rankin (Stroke Patients Only)       Balance Overall balance assessment: Needs assistance Sitting-balance support: Single extremity supported;Feet supported Sitting balance-Leahy Scale: Fair     Standing balance support: Bilateral upper extremity supported Standing balance-Leahy Scale: Zero                               Pertinent Vitals/Pain Pain Assessment: Faces Faces Pain Scale: Hurts even more Pain Location: LLE with weight bearing and mobility Pain Descriptors / Indicators: Guarding;Grimacing;Moaning Pain Intervention(s): Monitored during session;Limited activity within patient's tolerance;Repositioned;Patient requesting pain meds-RN notified;RN gave pain meds during session    Home Living Family/patient expects to be discharged to:: Skilled nursing facility Living Arrangements: Children;Non-relatives/Friends                    Prior Function Level of Independence: Independent         Comments: pt  was completely independent prior to admission     Hand Dominance   Dominant Hand: Right    Extremity/Trunk Assessment   Upper Extremity Assessment Upper Extremity Assessment: Defer to OT evaluation    Lower Extremity Assessment Lower Extremity Assessment: LLE deficits/detail LLE Deficits / Details: pt with significant weakness post op. At least 3/5 ankle and 2/5 knee and hip per gross functional assessment       Communication   Communication: No  difficulties;HOH  Cognition Arousal/Alertness: Awake/alert Behavior During Therapy: WFL for tasks assessed/performed Overall Cognitive Status: Within Functional Limits for tasks assessed                                        General Comments      Exercises Total Joint Exercises Ankle Circles/Pumps: AROM;Both;10 reps;Supine Quad Sets: AROM;Left;10 reps;Supine   Assessment/Plan    PT Assessment Patient needs continued PT services  PT Problem List Decreased strength;Decreased activity tolerance;Decreased range of motion;Decreased balance;Decreased mobility;Decreased knowledge of use of DME;Pain       PT Treatment Interventions DME instruction;Gait training;Functional mobility training;Therapeutic activities;Therapeutic exercise;Balance training    PT Goals (Current goals can be found in the Care Plan section)  Acute Rehab PT Goals Patient Stated Goal: to go to rehab and then home PT Goal Formulation: With patient Time For Goal Achievement: 07/07/17 Potential to Achieve Goals: Good    Frequency Min 3X/week   Barriers to discharge        Co-evaluation               AM-PAC PT "6 Clicks" Daily Activity  Outcome Measure Difficulty turning over in bed (including adjusting bedclothes, sheets and blankets)?: Unable Difficulty moving from lying on back to sitting on the side of the bed? : Unable Difficulty sitting down on and standing up from a chair with arms (e.g., wheelchair, bedside commode, etc,.)?: Unable Help needed moving to and from a bed to chair (including a wheelchair)?: Total Help needed walking in hospital room?: Total Help needed climbing 3-5 steps with a railing? : Total 6 Click Score: 6    End of Session Equipment Utilized During Treatment: Gait belt;Oxygen Activity Tolerance: Patient limited by fatigue;Patient limited by pain Patient left: in bed;with call bell/phone within reach;with bed alarm set;with nursing/sitter in room Nurse  Communication: Mobility status PT Visit Diagnosis: Other abnormalities of gait and mobility (R26.89);Muscle weakness (generalized) (M62.81);History of falling (Z91.81);Pain Pain - Right/Left: Left Pain - part of body: Knee;Hip;Leg    Time: 1201-1248 PT Time Calculation (min) (ACUTE ONLY): 47 min   Charges:   PT Evaluation $PT Eval Moderate Complexity: 1 Mod PT Treatments $Therapeutic Activity: 23-37 mins   PT G Codes:        Colin Broach PT, DPT  [phone]   Roxy Manns 06/23/2017, 2:02 PM

## 2017-06-23 NOTE — Progress Notes (Signed)
   Assessment / Plan: 2 Days Post-Op  S/P Procedure(s) (LRB): INTRAMEDULLARY (IM) NAIL INTERTROCHANTRIC Left Leg (Left) by Dr. Jewel Baize. Eulah Pont on 06/21/17  Principal Problem:   Hypotension Active Problems:   GERD   DM (diabetes mellitus), type 2 with renal complications (HCC)   Hypertension   Hyperlipidemia   Chronic kidney disease, stage III (moderate)   Atrial fibrillation (HCC)   Closed left hip fracture, initial encounter (HCC)   Syncope   Anemia   Elevated troponin   Closed fracture of left hip (HCC)  Closed left intertrochanteric Femur Fracture: Please place another 4x4 Mepilex Dressing on most proximal incision Mobilize with therapy when able Continue care per Medicine / CCM No orthopedic contraindication for DVT prophylaxis. Incentive Spirometry Apply ice prn  Weight Bearing: Weight Bearing as Tolerated (WBAT) LLE Dressings: PRN Mepilex.  VTE prophylaxis: Eliquis (home medicine) held.  On [place] Heparin, SCDs, ambulation Dispo: Skilled Nursing Facility/Rehab   Subjective: Feeling much better today.  Not yet OOB.  Objective:   VITALS:   Vitals:   06/22/17 1245 06/22/17 1653 06/22/17 2147 06/23/17 0508  BP: (!) 86/56 97/62 (!) 97/55 106/65  Pulse: 65 (!) 58 60 60  Resp: 18 18 18 18   Temp: 98 F (36.7 C) 98.3 F (36.8 C) (!) 97.4 F (36.3 C) (!) 97.3 F (36.3 C)  TempSrc:   Oral Oral  SpO2: 100% 97% 95% 95%  Weight:      Height:       CBC Latest Ref Rng & Units 06/21/2017 06/21/2017 06/20/2017  WBC 4.0 - 10.5 K/uL 6.1 9.0 -  Hemoglobin 13.0 - 17.0 g/dL 7.9(X) 10.9(L) 10.2(L)  Hematocrit 39.0 - 52.0 % 31.4(L) 34.9(L) 30.0(L)  Platelets 150 - 400 K/uL 129(L) 144(L) -   BMP Latest Ref Rng & Units 06/21/2017 06/21/2017 06/20/2017  Glucose 65 - 99 mg/dL 505(W) 979(Y) 801(K)  BUN 6 - 20 mg/dL 55(V) 74(M) 27(M)  Creatinine 0.61 - 1.24 mg/dL 7.86(L) 5.44(B) 2.01(E)  Sodium 135 - 145 mmol/L 141 142 143  Potassium 3.5 - 5.1 mmol/L 4.0 4.4 3.6  Chloride 101 - 111  mmol/L 106 106 103  CO2 22 - 32 mmol/L 23 23 -  Calcium 8.9 - 10.3 mg/dL 8.3(L) 8.5(L) -   Intake/Output      08/23 0701 - 08/24 0700 08/24 0701 - 08/25 0700   P.O. 120    I.V. (mL/kg) 200 (1.8)    Total Intake(mL/kg) 320 (3)    Urine (mL/kg/hr) 525 (0.2)    Total Output 525     Net -205            Physical Exam: General: NAD.  Supine in bed on arrival.  Calm, conversant.  [place] in place.  No increased wob. MSK Left LE Bandages - c/d/i.  Most proximal dressing off - steri strips in place. Sensation intact distally Feet warm Dorsiflexion/Plantar flexion intact   Albina Billet III, PA-C 06/23/2017, 7:40 AM

## 2017-06-23 NOTE — Progress Notes (Signed)
PROGRESS NOTE    [name] [name]  QQV:[id number] DOB: 06/14/1925 DOA: 06/20/2017 PCP: Gaspar Garbe, MD    Brief Narrative:  Patient is a 83 year old gentleman history of chronic kidney disease stage III, diabetes type 2, CHF, BPH presenting to the ED after a syncopal episode with a fall and left hip pain. Patient with inability to bear weight. Patient noted to have a left hip fracture and underwent IM nail to the left hip per orthopedics. Postoperatively patient noted to be persistently hypotensive on pressors and critical care consulted. Patient was placed in the ICU unit. Patient subsequently weaned off pressors and transferred to the floor. Blood pressure borderline. Patient hypotension felt to be secondary to volume depletion. Patient now in acute on chronic diastolic heart failure/right-sided heart failure. Patient being followed by cardiology and placed on diuretics.   Assessment & Plan:   Principal Problem:   Hypotension Active Problems:   Syncope   Closed left hip fracture, initial encounter (HCC)   GERD   DM (diabetes mellitus), type 2 with renal complications (HCC)   Hypertension   Hyperlipidemia   Chronic kidney disease, stage III (moderate)   Atrial fibrillation (HCC)   Anemia   Elevated troponin   Closed fracture of left hip (HCC)  #1 hypotension Likely secondary to hypovolemia. Patient with no infectious etiology. Doubt if patient had a cardiogenic shock. 2-D echo with a normal EF. Cortisol levels within normal limits. Patient was on anticoagulation prior to admission and PEs unlikely. Patient was initially placed on pressors and in the critical care unit. Pressors have been weaned off. Blood pressure improved with hydration. Blood pressure little bit still borderline and will monitor. Antihypertensive medications have been discontinued. Patient has been resumed back on diuretics per cardiology and as such will monitor blood pressure closely. Follow.  #2 acute  on chronic diastolic heart failure /right-sided heart failure   patient volume overloaded on examination her likely secondary to right heart failure in the setting of aggressive fluid resuscitation due to hypotension. 2-D echo consistent with right heart failure with moderate pulmonary hypertension and EF of 55-60%. Patient with lower extremity edema. Patient has been seen by cardiology and patient started on diuretics with holding parameters. Patient also started on sildenafil per cardiology. Strict I's and O's. Daily weights. Antihypertensive medications on hold secondary to borderline blood pressure. Cardiology following and appreciate input and recommendations.  #3 syncope Patient had a syncopal episode prior to admission leading to his fall and left hip fracture. Syncopal episode secondary to volume depletion. Patient noted to be significantly hypotensive postoperatively and had to be placed on pressors. Cardiac enzymes mildly elevated. 2-D echo with normal EF, no wall motion abnormalities, moderate pulmonary hypertension. Patient has been hydrated with IV fluids. Antihypertensive medications as well as diuretics on hold. Follow.  #4 atrial fibrillation CHA2DS2VASC 5 Rate controlled. Resume eliquis for anticoagulation as per orthopedics.   #5 iron deficiency anemia Anemia panel consistent with iron deficiency anemia. Patient with no overt bleeding. H&H stable.   #6 elevated troponin Questionable etiology. Likely secondary to volume overload. Hypotension improved. Patient now with hypotension. 2-D echo with a EF of 55-60% with increased right ventricular systolic pressure consistent with moderate pulmonary hypertension. Cardiology following.  #7 closed left hip fracture Secondary to mechanical fall secondary to syncope. Patient status post IM nail per orthopedics. Per orthopedics.  #8 diabetes mellitus type 2 Check a hemoglobin A1c. CBGs have ranged from 87 - 131. Continue sliding scale  insulin.  #9  hypertension Antihypertensive blood pressure medications on hold secondary to borderline blood pressure.   #10 hyperlipidemia Continue home regimen of statin.   #11 BPH Continue Flomax, Proscar, uroxatral. Outpatient follow-up.  #12 chronic kidney disease stage III/IV Stable. Monitor closely with diuresis.   DVT prophylaxis: Resume eliquis. Code Status: Full Family Communication: Updated patient. No family at bedside. Disposition Plan: Remain on telemetry. Likely skilled nursing facility once cardiac issues have improved and per orthopedics.   Consultants:   Orthopedics: Dr. Margarita Rana 06/21/2017  PCCM Dr. Jamison Neighbor 06/21/2017   Cardiology: Dr. Jacinto Halim 06/23/2017   Procedures:   CT head CT /C-spine 06/20/2017  Chest x-ray 06/20/2017  IM nail intertrochanteric LLE --per Dr. Margarita Rana 06/21/2017  Antimicrobials:   None   Subjective: Patient laying in the bed. Patient with complaints of shortness of breath. Patient denies any chest pain. Patient off pressors.  Objective: Vitals:   06/22/17 2147 06/23/17 0508 06/23/17 1306 06/23/17 1550  BP: (!) 97/55 106/65 (!) 101/50 (!) 104/51  Pulse: 60 60  (!) 48  Resp: 18 18  20   Temp: (!) 97.4 F (36.3 C) (!) 97.3 F (36.3 C)  (!) 97.4 F (36.3 C)  TempSrc: Oral Oral  Oral  SpO2: 95% 95% 98% 98%  Weight:   106.7 kg (235 lb 3.2 oz)   Height:   5\' 8"  (1.727 m)     Intake/Output Summary (Last 24 hours) at 06/23/17 1958 Last data filed at 06/23/17 0506  Gross per 24 hour  Intake                0 ml  Output              475 ml  Net             -475 ml   Filed Weights   06/22/17 0500 06/22/17 1138 06/23/17 1306  Weight: 105.4 kg (232 lb 5.8 oz) 108.2 kg (238 lb 8 oz) 106.7 kg (235 lb 3.2 oz)    Examination:  General exam: Alert.  Respiratory system: Bibasilar crackles. No wheezing.   Cardiovascular system: Irregularly irregular. +  JVD, murmurs, rubs, gallops or clicks. 1-2+ bilateral lower  extremity edema. Gastrointestinal system: Abdomen is soft, nontender, nondistended, positive bowel sounds.  Central nervous system: Alert and oriented. No focal neurological deficits.  Extremities: Symmetric 5 x 5 power. Skin: No rashes, lesions or ulcers Psychiatry: Judgement and insight appear fair. Mood & affect appropriate.     Data Reviewed: I have personally reviewed following labs and imaging studies  CBC:  Recent Labs Lab 06/20/17 2054 06/20/17 2057 06/21/17 1252 06/21/17 2353 06/23/17 0712  WBC 5.5  --  9.0 6.1 6.8  NEUTROABS 4.3  --  8.1*  --   --   HGB 9.9* 10.2* 10.9* 9.9* 9.7*  HCT 31.1* 30.0* 34.9* 31.4* 30.5*  MCV 91.7  --  92.3 91.8 90.0  PLT 121*  --  144* 129* 142*   Basic Metabolic Panel:  Recent Labs Lab 06/20/17 2057 06/21/17 1821 06/21/17 2353 06/23/17 0712  NA 143 142 141 141  K 3.6 4.4 4.0 4.0  CL 103 106 106 107  CO2  --  23 23 24   GLUCOSE 120* 187* 202* 81  BUN 45* 56* 57* 72*  CREATININE 1.80* 2.27* 2.24* 2.15*  CALCIUM  --  8.5* 8.3* 8.6*  MG  --   --  1.9 2.1  PHOS  --   --  4.5 3.9   GFR: Estimated Creatinine Clearance:  27.6 mL/min (A) (by C-G formula based on SCr of 2.15 mg/dL (H)). Liver Function Tests:  Recent Labs Lab 06/21/17 0404  AST 26  ALT 16*  ALKPHOS 75  BILITOT 1.7*  PROT 6.1*  ALBUMIN 3.0*   No results for input(s): LIPASE, AMYLASE in the last 168 hours. No results for input(s): AMMONIA in the last 168 hours. Coagulation Profile: No results for input(s): INR, PROTIME in the last 168 hours. Cardiac Enzymes:  Recent Labs Lab 06/21/17 1821 06/21/17 2353  TROPONINI 0.11* 0.11*   BNP (last 3 results) No results for input(s): PROBNP in the last 8760 hours. HbA1C: No results for input(s): HGBA1C in the last 72 hours. CBG:  Recent Labs Lab 06/22/17 1651 06/22/17 2146 06/23/17 0816 06/23/17 1218 06/23/17 1708  GLUCAP 128* 125* 87 131* 128*   Lipid Profile: No results for input(s): CHOL, HDL,  LDLCALC, TRIG, CHOLHDL, LDLDIRECT in the last 72 hours. Thyroid Function Tests: No results for input(s): TSH, T4TOTAL, FREET4, T3FREE, THYROIDAB in the last 72 hours. Anemia Panel:  Recent Labs  06/21/17 1252  VITAMINB12 501  FOLATE 20.0  FERRITIN 130  TIBC 295  IRON 27*  RETICCTPCT 0.9   Sepsis Labs:  Recent Labs Lab 06/20/17 2058  LATICACIDVEN 2.11*    Recent Results (from the past 240 hour(s))  MRSA PCR Screening     Status: None   Collection Time: 06/21/17  5:47 AM  Result Value Ref Range Status   MRSA by PCR NEGATIVE NEGATIVE Final    Comment:        The GeneXpert MRSA Assay (FDA approved for NASAL specimens only), is one component of a comprehensive MRSA colonization surveillance program. It is not intended to diagnose MRSA infection nor to guide or monitor treatment for MRSA infections.          Radiology Studies: No results found.      Scheduled Meds: . allopurinol  100 mg Oral Daily  . apixaban  2.5 mg Oral BID  . atorvastatin  40 mg Oral q1800  . brinzolamide  1 drop Both Eyes TID  . docusate sodium  100 mg Oral BID  . finasteride  5 mg Oral Daily  . insulin aspart  0-15 Units Subcutaneous TID WC  . potassium chloride  10 mEq Oral Daily  . sildenafil  20 mg Oral TID  . tamsulosin  0.4 mg Oral Daily  . torsemide  20 mg Oral BID  . Travoprost (BAK Free)  1 drop Both Eyes QHS   Continuous Infusions: . lactated ringers 10 mL/hr at 06/22/17 1546  . phenylephrine (NEO-SYNEPHRINE) Adult infusion Stopped (06/21/17 2219)  . sodium chloride       LOS: 2 days    Time spent: 35 mins    Shiann Kam, MD [facility] Pager [phone]  If 7PM-7AM, please contact night-coverage [email or web address] Password [facility] 06/23/2017, 7:58 PM

## 2017-06-24 LAB — BASIC METABOLIC PANEL
ANION GAP: 9 (ref 5–15)
BUN: 69 mg/dL — ABNORMAL HIGH (ref 6–20)
CHLORIDE: 102 mmol/L (ref 101–111)
CO2: 29 mmol/L (ref 22–32)
Calcium: 8.5 mg/dL — ABNORMAL LOW (ref 8.9–10.3)
Creatinine, Ser: 2.06 mg/dL — ABNORMAL HIGH (ref 0.61–1.24)
GFR calc non Af Amer: 27 mL/min — ABNORMAL LOW (ref >60)
GFR, EST AFRICAN AMERICAN: 31 mL/min — AB (ref >60)
Glucose, Bld: 101 mg/dL — ABNORMAL HIGH (ref 65–99)
Potassium: 3.8 mmol/L (ref 3.5–5.1)
Sodium: 140 mmol/L (ref 135–145)

## 2017-06-24 LAB — CBC
HEMATOCRIT: 28.6 % — AB (ref 39.0–52.0)
HEMOGLOBIN: 9.1 g/dL — AB (ref 13.0–17.0)
MCH: 28.6 pg (ref 26.0–34.0)
MCHC: 31.8 g/dL (ref 30.0–36.0)
MCV: 89.9 fL (ref 78.0–100.0)
Platelets: 138 10*3/uL — ABNORMAL LOW (ref 150–400)
RBC: 3.18 MIL/uL — AB (ref 4.22–5.81)
RDW: 16.3 % — ABNORMAL HIGH (ref 11.5–15.5)
WBC: 5.1 10*3/uL (ref 4.0–10.5)

## 2017-06-24 LAB — GLUCOSE, CAPILLARY
GLUCOSE-CAPILLARY: 137 mg/dL — AB (ref 65–99)
GLUCOSE-CAPILLARY: 163 mg/dL — AB (ref 65–99)
Glucose-Capillary: 93 mg/dL (ref 65–99)
Glucose-Capillary: 98 mg/dL (ref 65–99)

## 2017-06-24 LAB — MAGNESIUM: Magnesium: 2 mg/dL (ref 1.7–2.4)

## 2017-06-24 MED ORDER — HYDROCODONE-ACETAMINOPHEN 5-325 MG PO TABS: 1.0000 | ORAL_TABLET | Freq: Four times a day (QID) | ORAL | 0 refills | Status: DC | PRN

## 2017-06-24 NOTE — Progress Notes (Signed)
PROGRESS NOTE    [name] [name]  ZOX:[id number] DOB: [birth year] DOA: 06/20/2017 PCP: Gaspar Garbe, MD    Brief Narrative:  Patient is a 83 year old gentleman history of chronic kidney disease stage III, diabetes type 2, CHF, BPH presenting to the ED after a syncopal episode with a fall and left hip pain. Patient with inability to bear weight. Patient noted to have a left hip fracture and underwent IM nail to the left hip per orthopedics. Postoperatively patient noted to be persistently hypotensive on pressors and critical care consulted. Patient was placed in the ICU unit. Patient subsequently weaned off pressors and transferred to the floor. Blood pressure borderline. Patient hypotension felt to be secondary to volume depletion. Patient now in acute on chronic diastolic heart failure/right-sided heart failure. Patient being followed by cardiology and placed on diuretics.   Assessment & Plan:   Principal Problem:   Hypotension Active Problems:   Syncope   Closed left hip fracture, initial encounter (HCC)   GERD   DM (diabetes mellitus), type 2 with renal complications (HCC)   Hypertension   Hyperlipidemia   Chronic kidney disease, stage III (moderate)   Atrial fibrillation (HCC)   Anemia   Elevated troponin   Closed fracture of left hip (HCC)  #1 hypotension Likely secondary to hypovolemia. Patient with no infectious etiology. Doubt if patient had a cardiogenic shock. 2-D echo with a normal EF. Cortisol levels within normal limits. Patient was on anticoagulation prior to admission and PEs unlikely. Patient was initially placed on pressors and in the critical care unit.  Pressors have been weaned off. Blood pressure improved with hydration however borderline. Continue to hold antihypertensive medications as patient has been started back on diuretics due to acute CHF exacerbation. Follow.  #2 acute on chronic diastolic heart failure /right-sided heart failure   Patient  volume overloaded on examination, likely secondary to right heart failure in the setting of aggressive fluid resuscitation due to hypotension. 2-D echo consistent with right heart failure with moderate pulmonary hypertension and EF of 55-60%. Patient with lower extremity edema. Patient with a urine output of 1.450 L over the past 24 hours. Patient has been seen by cardiology and patient started on diuretics with holding parameters. Patient also started on sildenafil per cardiology. Strict I's and O's. Daily weights. Continue to hold antihypertensive medications on hold secondary to borderline blood pressure. Cardiology following and appreciate input and recommendations.  #3 syncope Patient had a syncopal episode prior to admission leading to his fall and left hip fracture. Syncopal episode secondary to volume depletion. Patient noted to be significantly hypotensive postoperatively and had to be placed on pressors. Cardiac enzymes mildly elevated. 2-D echo with normal EF, no wall motion abnormalities, moderate pulmonary hypertension. Patient has been hydrated with IV fluids. Antihypertensive medications on hold. Patient's diuretics have an resumed secondary to acute CHF exacerbation. No further syncopal episodes. Monitor closely.  #4 atrial fibrillation CHA2DS2VASC 5 Rate controlled. Eliquis was resumed for anticoagulation.  #5 iron deficiency anemia Anemia panel consistent with iron deficiency anemia. Hemoglobin stable. Patient with no overt bleeding. H&H stable.   #6 elevated troponin Questionable etiology. Likely secondary to volume overload. Hypotension improved. Patient now with hypotension. 2-D echo with a EF of 55-60% with increased right ventricular systolic pressure consistent with moderate pulmonary hypertension. Cardiology following.  #7 closed left hip fracture Secondary to mechanical fall secondary to syncope. Patient status post IM nail per orthopedics. Per orthopedics.  #8 diabetes  mellitus type 2 Check a  hemoglobin A1c. CBGs have ranged from 93 - 137. Sliding scale insulin.   #9 hypertension Antihypertensive blood pressure medications on hold secondary to borderline blood pressure. Follow.  #10 hyperlipidemia Stable. Continue statin.   #11 BPH Continue Flomax, Proscar, uroxatral. Outpatient follow-up.  #12 chronic kidney disease stage III/IV Stable. Monitor closely with diuresis.   DVT prophylaxis: eliquis. Code Status: Full Family Communication: Updated patient. No family at bedside. Disposition Plan: Remain on telemetry. Likely skilled nursing facility once cardiac issues have improved and per orthopedics and cardiology.   Consultants:   Orthopedics: Dr. Margarita Rana 06/21/2017  PCCM Dr. Jamison Neighbor 06/21/2017   Cardiology: Dr. Jacinto Halim 06/23/2017   Procedures:   CT head CT /C-spine 06/20/2017  Chest x-ray 06/20/2017  IM nail intertrochanteric LLE --per Dr. Margarita Rana 06/21/2017  Antimicrobials:   None   Subjective: Patient sitting up in chair. Patient states shortness of breath improvement after diuretics were started. No chest pain. Off pressors.   Objective: Vitals:   06/24/17 0445 06/24/17 0446 06/24/17 0757 06/24/17 1540  BP:  95/62  (!) 107/58  Pulse:  (!) 53  60  Resp:  16  17  Temp:  97.7 F (36.5 C)  (!) 97.5 F (36.4 C)  TempSrc:  Oral  Oral  SpO2:  94% 100% 94%  Weight: 106.9 kg (235 lb 11.2 oz)     Height:        Intake/Output Summary (Last 24 hours) at 06/24/17 1853 Last data filed at 06/24/17 1544  Gross per 24 hour  Intake              240 ml  Output             2600 ml  Net            -2360 ml   Filed Weights   06/22/17 1138 06/23/17 1306 06/24/17 0445  Weight: 108.2 kg (238 lb 8 oz) 106.7 kg (235 lb 3.2 oz) 106.9 kg (235 lb 11.2 oz)    Examination:  General exam: Alert. Sitting up Respiratory system: Diffuse crackles. No wheezing. No rhonchi.  Cardiovascular system: Irregularly irregular. +  JVD,  murmurs, rubs, gallops or clicks. 1-2+ bilateral lower extremity edema. Gastrointestinal system: Abdomen is soft, nontender, nondistended, positive bowel sounds.  Central nervous system: Alert and oriented. No focal neurological deficits.  Extremities: Symmetric 5 x 5 power. Skin: No rashes, lesions or ulcers Psychiatry: Judgement and insight appear fair. Mood & affect appropriate.     Data Reviewed: I have personally reviewed following labs and imaging studies  CBC:  Recent Labs Lab 06/20/17 2054 06/20/17 2057 06/21/17 1252 06/21/17 2353 06/23/17 0712 06/24/17 0750  WBC 5.5  --  9.0 6.1 6.8 5.1  NEUTROABS 4.3  --  8.1*  --   --   --   HGB 9.9* 10.2* 10.9* 9.9* 9.7* 9.1*  HCT 31.1* 30.0* 34.9* 31.4* 30.5* 28.6*  MCV 91.7  --  92.3 91.8 90.0 89.9  PLT 121*  --  144* 129* 142* 138*   Basic Metabolic Panel:  Recent Labs Lab 06/20/17 2057 06/21/17 1821 06/21/17 2353 06/23/17 0712 06/24/17 0750  NA 143 142 141 141 140  K 3.6 4.4 4.0 4.0 3.8  CL 103 106 106 107 102  CO2  --  23 23 24 29   GLUCOSE 120* 187* 202* 81 101*  BUN 45* 56* 57* 72* 69*  CREATININE 1.80* 2.27* 2.24* 2.15* 2.06*  CALCIUM  --  8.5* 8.3* 8.6* 8.5*  MG  --   --  1.9 2.1 2.0  PHOS  --   --  4.5 3.9  --    GFR: Estimated Creatinine Clearance: 28.2 mL/min (A) (by C-G formula based on SCr of 2.06 mg/dL (H)). Liver Function Tests:  Recent Labs Lab 06/21/17 0404  AST 26  ALT 16*  ALKPHOS 75  BILITOT 1.7*  PROT 6.1*  ALBUMIN 3.0*   No results for input(s): LIPASE, AMYLASE in the last 168 hours. No results for input(s): AMMONIA in the last 168 hours. Coagulation Profile: No results for input(s): INR, PROTIME in the last 168 hours. Cardiac Enzymes:  Recent Labs Lab 06/21/17 1821 06/21/17 2353  TROPONINI 0.11* 0.11*   BNP (last 3 results) No results for input(s): PROBNP in the last 8760 hours. HbA1C: No results for input(s): HGBA1C in the last 72 hours. CBG:  Recent Labs Lab  06/23/17 1708 06/23/17 2117 06/24/17 0759 06/24/17 1139 06/24/17 1709  GLUCAP 128* 122* 93 98 137*   Lipid Profile: No results for input(s): CHOL, HDL, LDLCALC, TRIG, CHOLHDL, LDLDIRECT in the last 72 hours. Thyroid Function Tests: No results for input(s): TSH, T4TOTAL, FREET4, T3FREE, THYROIDAB in the last 72 hours. Anemia Panel: No results for input(s): VITAMINB12, FOLATE, FERRITIN, TIBC, IRON, RETICCTPCT in the last 72 hours. Sepsis Labs:  Recent Labs Lab 06/20/17 2058  LATICACIDVEN 2.11*    Recent Results (from the past 240 hour(s))  MRSA PCR Screening     Status: None   Collection Time: 06/21/17  5:47 AM  Result Value Ref Range Status   MRSA by PCR NEGATIVE NEGATIVE Final    Comment:        The GeneXpert MRSA Assay (FDA approved for NASAL specimens only), is one component of a comprehensive MRSA colonization surveillance program. It is not intended to diagnose MRSA infection nor to guide or monitor treatment for MRSA infections.          Radiology Studies: No results found.      Scheduled Meds: . allopurinol  100 mg Oral Daily  . apixaban  2.5 mg Oral BID  . atorvastatin  40 mg Oral q1800  . brinzolamide  1 drop Both Eyes TID  . docusate sodium  100 mg Oral BID  . finasteride  5 mg Oral Daily  . insulin aspart  0-15 Units Subcutaneous TID WC  . potassium chloride  10 mEq Oral Daily  . sildenafil  20 mg Oral TID  . tamsulosin  0.4 mg Oral Daily  . torsemide  20 mg Oral BID  . Travoprost (BAK Free)  1 drop Both Eyes QHS   Continuous Infusions: . lactated ringers 10 mL/hr at 06/22/17 1546  . phenylephrine (NEO-SYNEPHRINE) Adult infusion Stopped (06/21/17 2219)  . sodium chloride       LOS: 3 days    Time spent: 40 mins    THOMPSON,DANIEL, MD [facility] Pager [phone]  If 7PM-7AM, please contact night-coverage [email or web address] Password [facility] 06/24/2017, 6:53 PM

## 2017-06-24 NOTE — Progress Notes (Signed)
Physical Therapy Treatment Patient Details Name: [name] [name] MRN: [id number] DOB: September 04, 1925 Today's Date: 06/24/2017    History of Present Illness Pt is an 83 y.o. male admitted through ED on 06/20/17 following a syncopal episode related to hypotension resulting in a fall and L hip fx. Pt underwent an IM nail on 06/21/17. Pt has continued to have issues with hypotension which has limited his ability to mobilize. PMH significant for CKD3, DM2, chronic diastolic HF, HLD, HTN.     PT Comments    Pt continues to requires at least +2 assistance for bed mobility and transfers this session. Pt is able to initially stand with Mod A to RW, but cannot bear weight through LLE and use UE's to compensate. Pt was a very heavy +2 total assist to transfer to the recliner. NT advised to use stedy to get pt back into chair. SNF continues to be the best option at d/c.     Follow Up Recommendations  SNF     Equipment Recommendations  None recommended by PT    Recommendations for Other Services OT consult     Precautions / Restrictions Precautions Precautions: Fall Precaution Comments: Watch orthostatics Restrictions Weight Bearing Restrictions: Yes LLE Weight Bearing: Weight bearing as tolerated    Mobility  Bed Mobility Overal bed mobility: Needs Assistance Bed Mobility: Supine to Sit     Supine to sit: +2 for physical assistance;Mod assist;HOB elevated Sit to supine: Max assist;+2 for physical assistance   General bed mobility comments: assist for trunk and LLE. Cues given  Transfers Overall transfer level: Needs assistance Equipment used: Rolling walker (2 wheeled) Transfers: Sit to/from [employer] Sit to Stand: +2 physical assistance;Mod assist Stand pivot transfers: +2 physical assistance;Total assist       General transfer comment: +2 Mod A for initial sit to stand from bed. +2 Total A for squat pivot to recliner chair without use of  RW.  Ambulation/Gait             General Gait Details: Not assessed this session   Stairs            Wheelchair Mobility    Modified Rankin (Stroke Patients Only)       Balance Overall balance assessment: Needs assistance Sitting-balance support: Single extremity supported;Feet supported Sitting balance-Leahy Scale: Fair     Standing balance support: Bilateral upper extremity supported Standing balance-Leahy Scale: Zero                              Cognition Arousal/Alertness: Awake/alert Behavior During Therapy: WFL for tasks assessed/performed Overall Cognitive Status: Within Functional Limits for tasks assessed                                        Exercises      General Comments        Pertinent Vitals/Pain Pain Assessment: 0-10 Pain Score: 5  Pain Location: LLE with mobility Pain Descriptors / Indicators: Grimacing;Guarding;Sore Pain Intervention(s): Monitored during session;Premedicated before session;Repositioned    Home Living Family/patient expects to be discharged to:: Skilled nursing facility Living Arrangements: Children;Non-relatives/Friends                  Prior Function Level of Independence: Independent      Comments: pt was completely independent prior to admission   PT Goals (current  goals can now be found in the care plan section) Acute Rehab PT Goals Patient Stated Goal: to go to rehab and then home Progress towards PT goals: Progressing toward goals    Frequency    Min 3X/week      PT Plan Current plan remains appropriate    Co-evaluation PT/OT/SLP Co-Evaluation/Treatment: Yes Reason for Co-Treatment: For patient/therapist safety;To address functional/ADL transfers PT goals addressed during session: Mobility/safety with mobility OT goals addressed during session: Other (comment);ADL's and self-care (mobility)      AM-PAC PT "6 Clicks" Daily Activity  Outcome Measure   Difficulty turning over in bed (including adjusting bedclothes, sheets and blankets)?: Unable Difficulty moving from lying on back to sitting on the side of the bed? : Unable Difficulty sitting down on and standing up from a chair with arms (e.g., wheelchair, bedside commode, etc,.)?: Unable Help needed moving to and from a bed to chair (including a wheelchair)?: Total Help needed walking in hospital room?: Total Help needed climbing 3-5 steps with a railing? : Total 6 Click Score: 6    End of Session Equipment Utilized During Treatment: Gait belt;Oxygen Activity Tolerance: Patient limited by fatigue;Patient limited by pain Patient left: with call bell/phone within reach;in chair;with chair alarm set Nurse Communication: Mobility status;Need for lift equipment PT Visit Diagnosis: Other abnormalities of gait and mobility (R26.89);Muscle weakness (generalized) (M62.81);History of falling (Z91.81);Pain Pain - Right/Left: Left Pain - part of body: Knee;Hip;Leg     Time: 9767-3419 PT Time Calculation (min) (ACUTE ONLY): 27 min  Charges:  $Therapeutic Activity: 8-22 mins                    G Codes:       Colin Broach PT, DPT  [phone]    Ruel Favors Aletha Halim 06/24/2017, 1:25 PM

## 2017-06-24 NOTE — Progress Notes (Addendum)
Subjective: 3 Days Post-Op Procedure(s) (LRB): INTRAMEDULLARY (IM) NAIL INTERTROCHANTRIC Left Leg (Left) Patient reports pain as mild.  Doing well this am.  Objective: Vital signs in last 24 hours: Temp:  [97.4 F (36.3 C)-97.9 F (36.6 C)] 97.7 F (36.5 C) (08/25 0446) Pulse Rate:  [48-69] 53 (08/25 0446) Resp:  [16-20] 16 (08/25 0446) BP: (95-104)/(34-62) 95/62 (08/25 0446) SpO2:  [90 %-100 %] 100 % (08/25 0757) Weight:  [106.7 kg (235 lb 3.2 oz)-106.9 kg (235 lb 11.2 oz)] 106.9 kg (235 lb 11.2 oz) (08/25 0445)  Intake/Output from previous day: 08/24 0701 - 08/25 0700 In: 240 [P.O.:240] Out: 1450 [Urine:1450] Intake/Output this shift: Total I/O In: -  Out: 350 [Urine:350]   Recent Labs  06/21/17 1252 06/21/17 2353 06/23/17 0712 06/24/17 0750  HGB 10.9* 9.9* 9.7* 9.1*    Recent Labs  06/23/17 0712 06/24/17 0750  WBC 6.8 5.1  RBC 3.39* 3.18*  HCT 30.5* 28.6*  PLT 142* 138*    Recent Labs  06/23/17 0712 06/24/17 0750  NA 141 140  K 4.0 3.8  CL 107 102  CO2 24 29  BUN 72* 69*  CREATININE 2.15* 2.06*  GLUCOSE 81 101*  CALCIUM 8.6* 8.5*   No results for input(s): LABPT, INR in the last 72 hours.  Neurologically intact Neurovascular intact Sensation intact distally Intact pulses distally Dorsiflexion/Plantar flexion intact Incision: dressing C/D/I No cellulitis present Compartment soft  Assessment/Plan: 3 Days Post-Op Procedure(s) (LRB): INTRAMEDULLARY (IM) NAIL INTERTROCHANTRIC Left Leg (Left) Advance diet Up with therapy  WBAT LLE Will sign off as stable from ortho standpoint norco rx in chart Resume pre-op eliquis for dvt ppx Fu with Dr. Renaye Rakers in two weeks  Otilio Saber 06/24/2017, 10:08 AM

## 2017-06-24 NOTE — Progress Notes (Signed)
Follow up for acute on chronic sytstolic/chronic diastolic heart failure  Subjective:  Feels well this morning No significant complaints. Breathing better. Denies chest pain  Telemetry: Atrial fibrillation with slow ventricular response, ventricular rate in 40-40.  Nurse Report: No acute events overnight Foley catheter still in place  Objective:  Vital Signs in the last 24 hours: Temp:  [97.4 F (36.3 C)-97.9 F (36.6 C)] 97.7 F (36.5 C) (08/25 0446) Pulse Rate:  [48-69] 53 (08/25 0446) Resp:  [16-20] 16 (08/25 0446) BP: (95-104)/(34-62) 95/62 (08/25 0446) SpO2:  [90 %-100 %] 100 % (08/25 0757) Weight:  [106.7 kg (235 lb 3.2 oz)-106.9 kg (235 lb 11.2 oz)] 106.9 kg (235 lb 11.2 oz) (08/25 0445)  Intake/Output from previous day: 08/24 0701 - 08/25 0700 In: 240 [P.O.:240] Out: 1450 [Urine:1450] -1.2 L over last 24 hrs Still +2.8 L net for hospital stay  Physical Exam: Vitals:   06/24/17 0446 06/24/17 0757  BP: 95/62   Pulse: (!) 53   Resp: 16   Temp: 97.7 F (36.5 C)   SpO2: 94% 100%   General appearance: alert, cooperative, appears stated age, no distress Neck: no adenopathy, no carotid bruit, supple, symmetrical, trachea midline, thyroid not enlarged, symmetric, no tenderness/mass/nodules and JVD elevated to the angle of the jaw Lungs: Bilateral good air entry. No rales. Heart: irregularly irregular rhythm, bradycardic, S1, S2 normal and systolic murmur: systolic ejection 3/6, crescendo at 2nd right intercostal space Abdomen: Distended, ascites present, nontender, bowel sounds heard in all 4 quadrants. Male genitalia: Improved scrotal edema. Foley catheter in place. Extremities: edema 2+. Dressings intact left hip. No bleeding, hematoma Pulses: 2+ and symmetric Neurologic: Grossly normal  Lab Results:  Recent Labs  06/23/17 0712 06/24/17 0750  WBC 6.8 5.1  HGB 9.7* 9.1*  PLT 142* 138*    Recent Labs  06/23/17 0712 06/24/17 0750  NA 141 140  K 4.0  3.8  CL 107 102  CO2 24 29  GLUCOSE 81 101*  BUN 72* 69*  CREATININE 2.15* 2.06*    Recent Labs  06/21/17 1821 06/21/17 2353  TROPONINI 0.11* 0.11*    Imaging: No new imaging overnight  Cardiac Studies: CARDIAC STUDIES:  EKG 06/21/2017: Atrial fibrillation with controlled ventricular response at a rate of 66 bpm, poor R-wave progression, low-voltage complexes, nonspecific T abnormality. Borderline prolonged QT interval.  ECHO: 06/22/2017: Left ventricle: The cavity size was mildly reduced. There was moderate concentric hypertrophy. The estimated ejection fraction was in the range of 55% to 60%. The study is not technically sufficient to allow evaluation of LV diastolic function. - Aortic valve: Mildly calcified annulus. Trileaflet; moderately calcified leaflets. There was mild stenosis. Valve area (VTI): 1.5 cm. Valve area (Vmax): 1.29 cm. Valve area (Vmean): 1.26 cm- Mitral valve: Moderately calcified annulus. Valve area bycontinuity equation (using LVOT flow): 1.7 cm^2. - Left atrium: The atrium was moderately dilated. - Right ventricle: The cavity size was mildly dilated. Wall thickness was normal. - Right atrium: The atrium was moderately dilated. - Tricuspid valve: There was moderate regurgitation.Pulmonary arteries: PA peak pressure: 43 mm Hg (S).   Assessment/Plan:   83 year old patient Caucasian male admitted with syncope and fall leading to left femur intertrochanteric fracture s/p nail procedure 06/21/2017 Acute on chronic diastolic heart failure, persistent Afib with slow ventricular response, CAD, hypertension, DM, CKD 2, hyperlipidemia  Intertochanteric fracture: Orthopedics following. Dressing intact  Acute on chronic systolic/diastolic heart failure: Diuresing well on torsemide 20 mg bid. Needs at least two more days of  diuresis. Goal net negative 0.5-1 L daily Electrolyte replacement as necessary Salt restriction diet Continue sildenafil  given RV failure and pulmonary hypertension. Consider removing Foley catheter, as long as accurate in/out can be monitored.  Afib with slow ventricular response: No asystole, complete AV block on telemetry, however bradycardic in 40s while in Afib without AV nodal blocking agents suggest chronotropic incompetence. Patient well known to Dr. Jacinto Halim. I will discuss further with him regarding permanent pacemaker.  Continue apixaban . CHADVASC score 5, high risk, 6.7%  CAD: Known chronic RCA occlusion. No ongoing symptoms  Type 2 DM with complications CKD IV: Creatinine improving. On appropriate management     LOS: 3 days    Ecko Beasley J Tannen Vandezande 06/24/2017, 9:39 AM

## 2017-06-24 NOTE — Evaluation (Addendum)
Occupational Therapy Evaluation Patient Details Name: [name] [name] MRN: [id number] DOB: 1925/10/29 Today's Date: 06/24/2017    History of Present Illness Pt is an 83 y.o. male admitted through ED on 06/20/17 following a syncopal episode related to hypotension resulting in a fall and L hip fx. Pt underwent an IM nail on 06/21/17. Pt has continued to have issues with hypotension which has limited his ability to mobilize. PMH significant for CKD3, DM2, chronic diastolic HF, HLD, HTN.    Clinical Impression   Pt s/p above. Pt independent with ADLs, PTA. Feel pt will benefit from acute OT to increase independence prior to d/c. Recommending SNF for rehab.    Follow Up Recommendations  SNF    Equipment Recommendations  Other (comment) (defer to next venue)    Recommendations for Other Services       Precautions / Restrictions Precautions Precautions: Fall Precaution Comments: Watch orthostatics Restrictions Weight Bearing Restrictions: Yes LLE Weight Bearing: Weight bearing as tolerated      Mobility Bed Mobility Overal bed mobility: Needs Assistance Bed Mobility: Supine to Sit     Supine to sit: +2 for physical assistance;Mod assist;HOB elevated     General bed mobility comments: assist for trunk and LLE. Cues given  Transfers Overall transfer level: Needs assistance Equipment used: Rolling walker (2 wheeled) Transfers: Sit to/from [employer] Sit to Stand: +2 physical assistance;Mod assist Stand pivot transfers: +2 physical assistance;Total assist       General transfer comment: +2 Mod A for initial sit to stand from bed. +2 Total A for squat pivot to recliner chair without use of RW.    Balance    +2 Total A for squat pivot transfer.                                       ADL either performed or assessed with clinical judgement   ADL Overall ADL's : Needs assistance/impaired                     Lower Body Dressing:  Total assistance;Sit to/from stand;+2 for physical assistance   Toilet Transfer: +2 for physical assistance;Total assistance;Squat-pivot           Functional mobility during ADLs: +2 for physical assistance;Total assistance (+2 Total-squat pivot; +2 Mod-initial stand with RW) General ADL Comments: Explained that AE is available to assist with LB ADLs.      Vision         Perception     Praxis      Pertinent Vitals/Pain Pain Assessment: 0-10 Pain Score: 5  Pain Location: LLE with mobility Pain Descriptors / Indicators: Other (Comment) (did not describe) Pain Intervention(s): Monitored during session;Repositioned     Hand Dominance Right   Extremity/Trunk Assessment Upper Extremity Assessment Upper Extremity Assessment: Generalized weakness   Lower Extremity Assessment Lower Extremity Assessment: Defer to PT evaluation       Communication Communication Communication: No difficulties;HOH   Cognition Arousal/Alertness: Awake/alert Behavior During Therapy: WFL for tasks assessed/performed Overall Cognitive Status: Within Functional Limits for tasks assessed                                     General Comments       Exercises     Shoulder Instructions      Home  Living Family/patient expects to be discharged to:: Skilled nursing facility Living Arrangements: Children;Non-relatives/Friends                                      Prior Functioning/Environment Level of Independence: Independent        Comments: pt was completely independent prior to admission        OT Problem List: Decreased strength;Decreased range of motion;Pain;Decreased knowledge of use of DME or AE;Impaired balance (sitting and/or standing);Decreased activity tolerance      OT Treatment/Interventions: Self-care/ADL training;DME and/or AE instruction;Therapeutic activities;Patient/family education;Balance training;Energy conservation    OT Goals(Current  goals can be found in the care plan section) Acute Rehab OT Goals Patient Stated Goal: not stated OT Goal Formulation: With patient Time For Goal Achievement: 07/01/17 Potential to Achieve Goals: Good ADL Goals Pt Will Perform Lower Body Bathing: with mod assist;with adaptive equipment;sit to/from stand Pt Will Perform Lower Body Dressing: with mod assist;with adaptive equipment;sit to/from stand Pt Will Transfer to Toilet: with max assist;stand pivot transfer;bedside commode Additional ADL Goal #1: Pt will perform bed mobility at Mod A level.  OT Frequency: Min 2X/week   Barriers to D/C:            Co-evaluation PT/OT/SLP Co-Evaluation/Treatment: Yes Reason for Co-Treatment: For patient/therapist safety   OT goals addressed during session: Other (comment);ADL's and self-care (mobility)      AM-PAC PT "6 Clicks" Daily Activity     Outcome Measure Help from another person eating meals?: None Help from another person taking care of personal grooming?: A Little Help from another person toileting, which includes using toliet, bedpan, or urinal?: Total Help from another person bathing (including washing, rinsing, drying)?: A Lot Help from another person to put on and taking off regular upper body clothing?: A Little Help from another person to put on and taking off regular lower body clothing?: Total 6 Click Score: 14   End of Session Equipment Utilized During Treatment: Gait belt;Rolling walker  Activity Tolerance: Patient limited by fatigue Patient left: in chair;with call bell/phone within reach;with chair alarm set  OT Visit Diagnosis: Muscle weakness (generalized) (M62.81);Pain Pain - Right/Left: Left Pain - part of body: Leg                Time: 1610-9604 OT Time Calculation (min): 21 min Charges:  OT General Charges $OT Visit: 1 Procedure OT Evaluation $OT Eval Moderate Complexity: 1 Procedure G-Codes:     Earlie Raveling OTR/L 06/24/2017, 11:30 AM

## 2017-06-25 DIAGNOSIS — R001 Bradycardia, unspecified: Secondary | ICD-10-CM

## 2017-06-25 LAB — BASIC METABOLIC PANEL
ANION GAP: 9 (ref 5–15)
BUN: 74 mg/dL — AB (ref 6–20)
CO2: 29 mmol/L (ref 22–32)
CREATININE: 2.23 mg/dL — AB (ref 0.61–1.24)
Calcium: 8.6 mg/dL — ABNORMAL LOW (ref 8.9–10.3)
Chloride: 101 mmol/L (ref 101–111)
GFR calc Af Amer: 28 mL/min — ABNORMAL LOW (ref >60)
GFR, EST NON AFRICAN AMERICAN: 24 mL/min — AB (ref >60)
GLUCOSE: 133 mg/dL — AB (ref 65–99)
Potassium: 3.6 mmol/L (ref 3.5–5.1)
Sodium: 139 mmol/L (ref 135–145)

## 2017-06-25 LAB — HEMOGLOBIN A1C
Hgb A1c MFr Bld: 6.2 % — ABNORMAL HIGH (ref 4.8–5.6)
Mean Plasma Glucose: 131.24 mg/dL

## 2017-06-25 LAB — GLUCOSE, CAPILLARY
GLUCOSE-CAPILLARY: 142 mg/dL — AB (ref 65–99)
Glucose-Capillary: 122 mg/dL — ABNORMAL HIGH (ref 65–99)
Glucose-Capillary: 128 mg/dL — ABNORMAL HIGH (ref 65–99)

## 2017-06-25 LAB — HEMOGLOBIN AND HEMATOCRIT, BLOOD
HEMATOCRIT: 27.3 % — AB (ref 39.0–52.0)
Hemoglobin: 8.7 g/dL — ABNORMAL LOW (ref 13.0–17.0)

## 2017-06-25 MED ORDER — POLYSACCHARIDE IRON COMPLEX 150 MG PO CAPS
150.0000 mg | ORAL_CAPSULE | Freq: Every day | ORAL | Status: DC
  Administered 2017-06-26 – 2017-06-28 (×3): 150 mg via ORAL
  Filled 2017-06-25 (×3): qty 1

## 2017-06-25 MED ORDER — FERUMOXYTOL INJECTION 510 MG/17 ML
510.0000 mg | Freq: Once | INTRAVENOUS | Status: AC
  Administered 2017-06-25: 510 mg via INTRAVENOUS
  Filled 2017-06-25: qty 17

## 2017-06-25 MED ORDER — TORSEMIDE 20 MG PO TABS
20.0000 mg | ORAL_TABLET | Freq: Two times a day (BID) | ORAL | Status: DC
  Administered 2017-06-26 – 2017-06-28 (×5): 20 mg via ORAL
  Filled 2017-06-25 (×5): qty 1

## 2017-06-25 NOTE — Progress Notes (Addendum)
Follow up for acute on chronic sytstolic/chronic diastolic heart failure  Subjective:  Slightly labored breathing this morning after bathing. Improved now Denies chest pain  Telemetry: Atrial fibrillation with slow ventricular response, ventricular rate in 40-50's.  Nurse Report: No acute events overnight Foley catheter still in place  Objective:  Vital Signs in the last 24 hours: Temp:  [97.5 F (36.4 C)-98.4 F (36.9 C)] 97.6 F (36.4 C) (08/26 0516) Pulse Rate:  [60-69] 62 (08/26 0516) Resp:  [17-22] 18 (08/26 0516) BP: (99-107)/(45-58) 99/54 (08/26 0516) SpO2:  [94 %-100 %] 100 % (08/26 0516)  Intake/Output from previous day: 08/25 0701 - 08/26 0700 In: -  Out: 1950 [Urine:1950] -1.9 L urine output over last 24 hrs, another 600 cc this morning Intake not well documented  Physical Exam: Vitals:   06/24/17 2207 06/25/17 0516  BP: (!) 101/53 (!) 99/54  Pulse:  62  Resp:  18  Temp:  97.6 F (36.4 C)  SpO2:  100%   General appearance: alert, cooperative, appears stated age, no distress Neck: no adenopathy, no carotid bruit, supple, symmetrical, trachea midline, thyroid not enlarged, symmetric, no tenderness/mass/nodules and JVD elevated to the angle of the jaw Lungs: Bilateral good air entry. No rales. Heart: irregularly irregular rhythm, bradycardic, S1, S2 normal and systolic murmur: systolic ejection 3/6, crescendo at 2nd right intercostal space Abdomen: Distended, ascites present, nontender, bowel sounds heard in all 4 quadrants. Male genitalia: Persistent scrotal edema. Foley catheter in place. Extremities: edema 2+. Dressings intact left hip. No bleeding, hematoma Pulses: 2+ and symmetric Neurologic: Grossly normal  Lab Results:  Recent Labs  06/23/17 0712 06/24/17 0750 06/25/17 0625  WBC 6.8 5.1  --   HGB 9.7* 9.1* 8.7*  PLT 142* 138*  --     Recent Labs  06/24/17 0750 06/25/17 0625  NA 140 139  K 3.8 3.6  CL 102 101  CO2 29 29  GLUCOSE  101* 133*  BUN 69* 74*  CREATININE 2.06* 2.23*   No results for input(s): TROPONINI in the last 72 hours.  Invalid input(s): CK, MB  Imaging: No new imaging overnight  Cardiac Studies: CARDIAC STUDIES:  EKG 06/21/2017: Atrial fibrillation with controlled ventricular response at a rate of 66 bpm, poor R-wave progression, low-voltage complexes, nonspecific T abnormality. Borderline prolonged QT interval.  ECHO: 06/22/2017: Left ventricle: The cavity size was mildly reduced. There was moderate concentric hypertrophy. The estimated ejection fraction was in the range of 55% to 60%. The study is not technically sufficient to allow evaluation of LV diastolic function. - Aortic valve: Mildly calcified annulus. Trileaflet; moderately calcified leaflets. There was mild stenosis. Valve area (VTI): 1.5 cm. Valve area (Vmax): 1.29 cm. Valve area (Vmean): 1.26 cm- Mitral valve: Moderately calcified annulus. Valve area bycontinuity equation (using LVOT flow): 1.7 cm^2. - Left atrium: The atrium was moderately dilated. - Right ventricle: The cavity size was mildly dilated. Wall thickness was normal. - Right atrium: The atrium was moderately dilated. - Tricuspid valve: There was moderate regurgitation.Pulmonary arteries: PA peak pressure: 43 mm Hg (S).   Assessment/Plan:   83 year old patient Caucasian male admitted with syncope and fall leading to left femur intertrochanteric fracture s/p nail procedure 06/21/2017 Acute on chronic diastolic heart failure, persistent Afib with slow ventricular response, CAD, hypertension, DM, CKD 2, hyperlipidemia  Intertochanteric fracture: Orthopedics following. Dressing intact  Fall/syncope: While syncope could well be partially due to orthostatic hypotension, bradycardic in 40s while in Afib without AV nodal blocking agents cocerning for chronotropic incompetence.  Patient well known to Dr. Jacinto Halim. I have discussed this with him. We will consult  EP to consider PPM placement . PPM may correct for bradycardia but likely will not correct for hypotension if the inciting event was truly a vasovagal cardioneurogenic syncope. Another offending agent causing orthostatic hypotension could be tamsulosin. Consider urology input regarding reducing the dose.  Keep NPO after breakfast tomorrow, until seen by EP.  Acute on chronic systolic/diastolic heart failure: Diuresing very well on torsemide 20 mg bid.  Goal net negative 0.5-1 L daily Electrolyte replacement as necessary Salt restriction diet Continue sildenafil given RV failure and pulmonary hypertension. Consider removing Foley catheter, as long as accurate in/out can be monitored. Patient does have BPH and may need in and out catheter if not able to void.  Creatinine has increased today to 2.23, and that is expected with aggressive diuresis. I am pleased with the diuresis. He still has some more volume to lose, but we could hold torsemide dose tonight to allow for plasma refill.  Afib with slow ventricular response: No asystole, complete AV block on telemetry.  Continue apixaban . CHADVASC score 5, high risk, 6.7%  CAD: Known chronic RCA occlusion. No ongoing symptoms  Type 2 DM with complications CKD IV: Creatinine improving. On appropriate management  I have discussed the current plan and potential for pacemaker placement with the patient and his daughter Gaston Islam ([phone]). I have discussed the DNR status with the patient and he is willing to rescind it for peri-procedural period should he require pacemaker placement.     LOS: 4 days    Jarred Purtee J Govind Furey 06/25/2017, 10:11 AM

## 2017-06-25 NOTE — Progress Notes (Signed)
PROGRESS NOTE    CINSERE MIZRAHI  ZOX:[id number] DOB: [birth year] DOA: 06/20/2017 PCP: Gaspar Garbe, MD    Brief Narrative:  Patient is a 83 year old gentleman history of chronic kidney disease stage III, diabetes type 2, CHF, BPH presenting to the ED after a syncopal episode with a fall and left hip pain. Patient with inability to bear weight. Patient noted to have a left hip fracture and underwent IM nail to the left hip per orthopedics. Postoperatively patient noted to be persistently hypotensive on pressors and critical care consulted. Patient was placed in the ICU unit. Patient subsequently weaned off pressors and transferred to the floor. Blood pressure borderline. Patient hypotension felt to be secondary to volume depletion. Patient now in acute on chronic diastolic heart failure/right-sided heart failure. Patient being followed by cardiology and placed on diuretics.   Assessment & Plan:   Principal Problem:   Hypotension Active Problems:   Syncope   Closed left hip fracture, initial encounter (HCC)   GERD   DM (diabetes mellitus), type 2 with renal complications (HCC)   Hypertension   Hyperlipidemia   Chronic kidney disease, stage III (moderate)   Atrial fibrillation (HCC)   Iron deficiency anemia   Elevated troponin   Closed fracture of left hip (HCC)  #1 hypotension Likely secondary to hypovolemia. Patient with no infectious etiology. Doubt if patient had a cardiogenic shock. 2-D echo with a normal EF. Cortisol levels within normal limits. Patient was on anticoagulation prior to admission and PEs unlikely. Patient was initially placed on pressors and in the critical care unit.  Pressors have been weaned off. Blood pressure improved with hydration however borderline. Continue to hold antihypertensive medications as patient has been started back on diuretics due to acute CHF exacerbation. Follow.  #2 acute on chronic diastolic heart failure /right-sided heart failure     Patient volume overloaded on examination, likely secondary to right heart failure in the setting of aggressive fluid resuscitation due to hypotension. 2-D echo consistent with right heart failure with moderate pulmonary hypertension and EF of 55-60%. Patient with lower extremity edema. Patient with a urine output of 1. 950 L over the past 24 hours. Patient has been seen by cardiology and patient started on diuretics with holding parameters. Patient also started on sildenafil per cardiology. Strict I's and O's. Daily weights. Continue to hold antihypertensive medications on hold secondary to borderline blood pressure. Cardiology following and appreciate input and recommendations.  #3 syncope Patient had a syncopal episode prior to admission leading to his fall and left hip fracture. Syncopal episode secondary to volume depletion versus cardiac in nature as patient noted to be bradycardic in the 40s and on no AV nodal blocking agents per cardiology. Patient noted to be significantly hypotensive postoperatively and had to be placed on pressors. Currently off pressors. Cardiac enzymes mildly elevated. 2-D echo with normal EF, no wall motion abnormalities, moderate pulmonary hypertension. Patient has been hydrated with IV fluids. Antihypertensive medications on hold. Patient's diuretics have an resumed secondary to acute CHF exacerbation. No further syncopal episodes. Cardiology has consulted EP for further evaluation of patient's bradycardia as they have been etiology of syncope and to see whether patient is a candidate for pacemaker placement. Monitor closely.  #4 atrial fibrillation CHA2DS2VASC 5 Rate controlled. Continue Eliquis for anticoagulation.  #5 iron deficiency anemia Anemia panel consistent with iron deficiency anemia. Hemoglobin stable. Patient with no overt bleeding. H&H stable.   #6 elevated troponin Questionable etiology. Likely secondary to volume overload. Hypotension  improved. Patient  now with hypotension. 2-D echo with a EF of 55-60% with increased right ventricular systolic pressure consistent with moderate pulmonary hypertension. Cardiology following.  #7 closed left hip fracture Secondary to mechanical fall secondary to syncope. Patient status post IM nail per orthopedics. Per orthopedics.  #8 diabetes mellitus type 2 Check a hemoglobin A1c. CBGs have ranged from 122 - 142. Sliding scale insulin.   #9 hypertension Antihypertensive blood pressure medications on hold secondary to borderline blood pressure. Follow.  #10 hyperlipidemia Stable. Continue statin.   #11 BPH Continue Flomax, Proscar, uroxatral. Outpatient follow-up.  #12 chronic kidney disease stage III/IV Stable. Monitor closely with diuresis.   DVT prophylaxis: eliquis. Code Status: Full Family Communication: Updated patient. No family at bedside. Disposition Plan: Remain on telemetry. Likely skilled nursing facility once cardiac issues have improved and per orthopedics and cardiology.   Consultants:   Orthopedics: Dr. Margarita Rana 06/21/2017  PCCM Dr. Jamison Neighbor 06/21/2017   Cardiology: Dr. Jacinto Halim 06/23/2017   Procedures:   CT head CT /C-spine 06/20/2017  Chest x-ray 06/20/2017  IM nail intertrochanteric LLE --per Dr. Margarita Rana 06/21/2017  Antimicrobials:   None   Subjective: Patient states shortness of breath is improving. Patient denies chest pain.  Objective: Vitals:   06/24/17 1540 06/24/17 2204 06/24/17 2207 06/25/17 0516  BP: (!) 107/58 (!) 107/45 (!) 101/53 (!) 99/54  Pulse: 60 69  62  Resp: 17 (!) 22  18  Temp: (!) 97.5 F (36.4 C) 98.4 F (36.9 C)  97.6 F (36.4 C)  TempSrc: Oral Oral  Oral  SpO2: 94% 98%  100%  Weight:      Height:        Intake/Output Summary (Last 24 hours) at 06/25/17 1437 Last data filed at 06/25/17 1321  Gross per 24 hour  Intake                0 ml  Output             2400 ml  Net            -2400 ml   Filed Weights    06/22/17 1138 06/23/17 1306 06/24/17 0445  Weight: 108.2 kg (238 lb 8 oz) 106.7 kg (235 lb 3.2 oz) 106.9 kg (235 lb 11.2 oz)    Examination:  General exam: In bed. Alert. Respiratory system: Bibasilar crackles.  Cardiovascular system: Irregularly irregular. Bradycardia. +  JVD, murmurs, rubs, gallops or clicks. 1-2+ bilateral lower extremity edema. Gastrointestinal system: Abdomen is soft, nontender, nondistended, positive bowel sounds.  Central nervous system: Alert and oriented. No focal neurological deficits.  Extremities: Symmetric 5 x 5 power. Skin: No rashes, lesions or ulcers Psychiatry: Judgement and insight appear fair. Mood & affect appropriate.     Data Reviewed: I have personally reviewed following labs and imaging studies  CBC:  Recent Labs Lab 06/20/17 2054  06/21/17 1252 06/21/17 2353 06/23/17 0712 06/24/17 0750 06/25/17 0625  WBC 5.5  --  9.0 6.1 6.8 5.1  --   NEUTROABS 4.3  --  8.1*  --   --   --   --   HGB 9.9*  < > 10.9* 9.9* 9.7* 9.1* 8.7*  HCT 31.1*  < > 34.9* 31.4* 30.5* 28.6* 27.3*  MCV 91.7  --  92.3 91.8 90.0 89.9  --   PLT 121*  --  144* 129* 142* 138*  --   < > = values in this interval not displayed. Basic Metabolic Panel:  Recent Labs Lab  06/21/17 1821 06/21/17 2353 06/23/17 0712 06/24/17 0750 06/25/17 0625  NA 142 141 141 140 139  K 4.4 4.0 4.0 3.8 3.6  CL 106 106 107 102 101  CO2 23 23 24 29 29   GLUCOSE 187* 202* 81 101* 133*  BUN 56* 57* 72* 69* 74*  CREATININE 2.27* 2.24* 2.15* 2.06* 2.23*  CALCIUM 8.5* 8.3* 8.6* 8.5* 8.6*  MG  --  1.9 2.1 2.0  --   PHOS  --  4.5 3.9  --   --    GFR: Estimated Creatinine Clearance: 26.1 mL/min (A) (by C-G formula based on SCr of 2.23 mg/dL (H)). Liver Function Tests:  Recent Labs Lab 06/21/17 0404  AST 26  ALT 16*  ALKPHOS 75  BILITOT 1.7*  PROT 6.1*  ALBUMIN 3.0*   No results for input(s): LIPASE, AMYLASE in the last 168 hours. No results for input(s): AMMONIA in the last 168  hours. Coagulation Profile: No results for input(s): INR, PROTIME in the last 168 hours. Cardiac Enzymes:  Recent Labs Lab 06/21/17 1821 06/21/17 2353  TROPONINI 0.11* 0.11*   BNP (last 3 results) No results for input(s): PROBNP in the last 8760 hours. HbA1C:  Recent Labs  06/25/17 0625  HGBA1C 6.2*   CBG:  Recent Labs Lab 06/24/17 1139 06/24/17 1709 06/24/17 2215 06/25/17 0739 06/25/17 1249  GLUCAP 98 137* 163* 122* 142*   Lipid Profile: No results for input(s): CHOL, HDL, LDLCALC, TRIG, CHOLHDL, LDLDIRECT in the last 72 hours. Thyroid Function Tests: No results for input(s): TSH, T4TOTAL, FREET4, T3FREE, THYROIDAB in the last 72 hours. Anemia Panel: No results for input(s): VITAMINB12, FOLATE, FERRITIN, TIBC, IRON, RETICCTPCT in the last 72 hours. Sepsis Labs:  Recent Labs Lab 06/20/17 2058  LATICACIDVEN 2.11*    Recent Results (from the past 240 hour(s))  MRSA PCR Screening     Status: None   Collection Time: 06/21/17  5:47 AM  Result Value Ref Range Status   MRSA by PCR NEGATIVE NEGATIVE Final    Comment:        The GeneXpert MRSA Assay (FDA approved for NASAL specimens only), is one component of a comprehensive MRSA colonization surveillance program. It is not intended to diagnose MRSA infection nor to guide or monitor treatment for MRSA infections.          Radiology Studies: No results found.      Scheduled Meds: . allopurinol  100 mg Oral Daily  . apixaban  2.5 mg Oral BID  . atorvastatin  40 mg Oral q1800  . brinzolamide  1 drop Both Eyes TID  . docusate sodium  100 mg Oral BID  . finasteride  5 mg Oral Daily  . insulin aspart  0-15 Units Subcutaneous TID WC  . [START ON 06/26/2017] iron polysaccharides  150 mg Oral Daily  . potassium chloride  10 mEq Oral Daily  . sildenafil  20 mg Oral TID  . tamsulosin  0.4 mg Oral Daily  . [START ON 06/26/2017] torsemide  20 mg Oral BID  . Travoprost (BAK Free)  1 drop Both Eyes QHS    Continuous Infusions: . lactated ringers 10 mL/hr at 06/22/17 1546  . phenylephrine (NEO-SYNEPHRINE) Adult infusion Stopped (06/21/17 2219)  . sodium chloride       LOS: 4 days    Time spent: 35 mins    Euva Rundell, MD [facility] Pager [phone]  If 7PM-7AM, please contact night-coverage [email or web address] Password [facility] 06/25/2017, 2:37 PM

## 2017-06-26 LAB — BASIC METABOLIC PANEL
Anion gap: 11 (ref 5–15)
Anion gap: 10 (ref 5–15)
BUN: 74 mg/dL — ABNORMAL HIGH (ref 6–20)
BUN: 75 mg/dL — ABNORMAL HIGH (ref 6–20)
CHLORIDE: 102 mmol/L (ref 101–111)
CHLORIDE: 101 mmol/L (ref 101–111)
CO2: 26 mmol/L (ref 22–32)
CO2: 29 mmol/L (ref 22–32)
CREATININE: 2.03 mg/dL — AB (ref 0.61–1.24)
Calcium: 8.8 mg/dL — ABNORMAL LOW (ref 8.9–10.3)
Calcium: 8.7 mg/dL — ABNORMAL LOW (ref 8.9–10.3)
Creatinine, Ser: 1.95 mg/dL — ABNORMAL HIGH (ref 0.61–1.24)
GFR calc non Af Amer: 27 mL/min — ABNORMAL LOW (ref >60)
GFR calc non Af Amer: 29 mL/min — ABNORMAL LOW (ref >60)
GFR, EST AFRICAN AMERICAN: 32 mL/min — AB (ref >60)
GFR, EST AFRICAN AMERICAN: 33 mL/min — AB (ref >60)
Glucose, Bld: 124 mg/dL — ABNORMAL HIGH (ref 65–99)
Glucose, Bld: 102 mg/dL — ABNORMAL HIGH (ref 65–99)
POTASSIUM: 3.6 mmol/L (ref 3.5–5.1)
Potassium: 3.1 mmol/L — ABNORMAL LOW (ref 3.5–5.1)
SODIUM: 140 mmol/L (ref 135–145)
Sodium: 139 mmol/L (ref 135–145)

## 2017-06-26 LAB — GLUCOSE, CAPILLARY
GLUCOSE-CAPILLARY: 118 mg/dL — AB (ref 65–99)
GLUCOSE-CAPILLARY: 97 mg/dL (ref 65–99)
GLUCOSE-CAPILLARY: 132 mg/dL — AB (ref 65–99)
Glucose-Capillary: 124 mg/dL — ABNORMAL HIGH (ref 65–99)

## 2017-06-26 MED ORDER — POTASSIUM CHLORIDE CRYS ER 20 MEQ PO TBCR
40.0000 meq | EXTENDED_RELEASE_TABLET | Freq: Every day | ORAL | Status: DC
  Administered 2017-06-26 – 2017-06-28 (×3): 40 meq via ORAL
  Filled 2017-06-26 (×3): qty 2

## 2017-06-26 NOTE — Care Management Important Message (Signed)
Important Message  Patient Details  Name: [name] [name] MRN: [id number] Date of Birth: 1925/04/18   Medicare Important Message Given:  Yes    Ranisha Allaire 06/26/2017, 2:10 PM

## 2017-06-26 NOTE — Progress Notes (Signed)
Follow up for syncope and acute on chronic sytstolic/chronic diastolic heart failure  Subjective:  Stable breathing Denies chest pain Slept well   Telemetry: Atrial fibrillation with slow ventricular response, ventricular rate in 40-70's.  Nurse Report: No acute events overnight Foley catheter removed yesterday. Difficulty accurately measuring I/O  Objective:  Vital Signs in the last 24 hours: Temp:  [97.5 F (36.4 C)-98.1 F (36.7 C)] 97.5 F (36.4 C) (08/27 0522) Pulse Rate:  [62-72] 62 (08/27 0522) Resp:  [20-22] 20 (08/27 0522) BP: (103-106)/(46-58) 103/58 (08/27 0522) SpO2:  [95 %-98 %] 98 % (08/27 0522)  Intake/Output from previous day: 08/26 0701 - 08/27 0700 In: 240 [P.O.:240] Out: 900 [Urine:900] -1.9 L urine output over last 24 hrs, another 600 cc this morning Intake not well documented  Physical Exam: Vitals:   06/25/17 2243 06/26/17 0522  BP: (!) 104/56 (!) 103/58  Pulse: 62 62  Resp: (!) 22 20  Temp: 98.1 F (36.7 C) (!) 97.5 F (36.4 C)  SpO2: 97% 98%   General appearance: alert, cooperative, appears stated age, no distress Neck: no adenopathy, no carotid bruit, supple, symmetrical, trachea midline, thyroid not enlarged, symmetric, no tenderness/mass/nodules and JVD elevated to the angle of the jaw Lungs: Bilateral good air entry. No rales. Heart: irregularly irregular rhythm, bradycardic, S1, S2 normal and systolic murmur: systolic ejection 3/6, crescendo at 2nd right intercostal space Abdomen: Distended, ascites present, nontender, bowel sounds heard in all 4 quadrants. Male genitalia: Persistent scrotal edema. Foley catheter in place. Extremities: edema 2+. Dressings intact left hip. No bleeding, hematoma Pulses: 2+ and symmetric Neurologic: Grossly normal  Lab Results:  Recent Labs  06/24/17 0750 06/25/17 0625  WBC 5.1  --   HGB 9.1* 8.7*  PLT 138*  --     Recent Labs  06/25/17 0625 06/26/17 0704  NA 139 139  K 3.6 3.1*  CL 101  102  CO2 29 26  GLUCOSE 133* 124*  BUN 74* 74*  CREATININE 2.23* 2.03*   No results for input(s): TROPONINI in the last 72 hours.  Invalid input(s): CK, MB  Imaging: No new imaging overnight  Cardiac Studies: CARDIAC STUDIES:  EKG 06/21/2017: Atrial fibrillation with controlled ventricular response at a rate of 66 bpm, poor R-wave progression, low-voltage complexes, nonspecific T abnormality. Borderline prolonged QT interval.  ECHO: 06/22/2017: Left ventricle: The cavity size was mildly reduced. There was moderate concentric hypertrophy. The estimated ejection fraction was in the range of 55% to 60%. The study is not technically sufficient to allow evaluation of LV diastolic function. - Aortic valve: Mildly calcified annulus. Trileaflet; moderately calcified leaflets. There was mild stenosis. Valve area (VTI): 1.5 cm. Valve area (Vmax): 1.29 cm. Valve area (Vmean): 1.26 cm- Mitral valve: Moderately calcified annulus. Valve area bycontinuity equation (using LVOT flow): 1.7 cm^2. - Left atrium: The atrium was moderately dilated. - Right ventricle: The cavity size was mildly dilated. Wall thickness was normal. - Right atrium: The atrium was moderately dilated. - Tricuspid valve: There was moderate regurgitation.Pulmonary arteries: PA peak pressure: 43 mm Hg (S).   Assessment/Plan:   83 year old patient Caucasian male admitted with syncope and fall leading to left femur intertrochanteric fracture s/p nail procedure 06/21/2017 Acute on chronic diastolic heart failure, persistent Afib with slow ventricular response, CAD, hypertension, DM, CKD 2, hyperlipidemia  Intertochanteric fracture: Orthopedics following. Dressing intact  Fall/syncope: While syncope could well be partially due to orthostatic hypotension, bradycardic in 40s while in Afib without AV nodal blocking agents cocerning for chronotropic  incompetence. EP consulted re: question about PPM placement. PPM may  correct for bradycardia but likely will not correct for hypotension if the inciting event was truly a vasovagal cardioneurogenic syncope. Another offending agent causing orthostatic hypotension could be tamsulosin. Consider urology input regarding reducing the dose.  Keep NPO after breakfast, until seen by EP.  Acute on chronic systolic/diastolic heart failure: Diuresing very well on torsemide 20 mg bid.  Goal net negative 0.5-1 L daily Electrolyte replacement as necessary Salt restriction diet Continue sildenafil given RV failure and pulmonary hypertension. Consider removing Foley catheter, as long as accurate in/out can be monitored. Patient does have BPH and may need in and out catheter if not able to void.  Creatinine has improved today to 2.05. Resume torsemide dose tonight to allow for plasma refill.  Afib with slow ventricular response: No asystole, complete AV block on telemetry.  Continue apixaban . CHADVASC score 5, high risk, 6.7%  CAD: Known chronic RCA occlusion. No ongoing symptoms  Type 2 DM with complications CKD IV: Creatinine improving. On appropriate management  I have discussed the current plan and potential for pacemaker placement with the patient and his daughter [name] [name] ([phone]). I have discussed the DNR status with the patient and he is willing to rescind it for peri-procedural period should he require pacemaker placement.     LOS: 5 days    Gaylia Kassel J Namita Yearwood 06/26/2017, 8:27 AM

## 2017-06-26 NOTE — Plan of Care (Signed)
Problem: Safety: Goal: Ability to remain free from injury will improve Outcome: Progressing Pt will be free from dizziness and falls during this hospitalization.  Problem: Activity: Goal: Ability to ambulate and perform ADLs will improve Outcome: Progressing Pt's ability to ambulate and perform ADLS will improve prior to discharge.   Problem: Fluid Volume: Goal: Compliance with measures to maintain balanced fluid volume will improve Outcome: Progressing Pt's intake and output measurements will improve during this hospitalization.

## 2017-06-26 NOTE — Evaluation (Signed)
Physical Therapy Evaluation Patient Details Name: [name] [name] MRN: [id number] DOB: [birth year] Today's Date: 06/26/2017   History of Present Illness  Pt is an 83 y.o. male admitted through ED on 06/20/17 following a syncopal episode related to hypotension resulting in a fall and L hip fx. Pt underwent an IM nail on 06/21/17. Pt has continued to have issues with hypotension which has limited his ability to mobilize. PMH significant for CKD3, DM2, chronic diastolic HF, HLD, HTN.   Clinical Impression   Continuing work on functional mobility and activity tolerance;  Able to stand for greater than 5 minutes with bil UE support on RW; initiated work on gait training with RW; noted good advancement of LLE; much more difficutly with RLE advancement as it is quite painful to accept weight on LLE; cues to use RW support to off load LLE in stance    Follow Up Recommendations SNF    Equipment Recommendations  None recommended by PT    Recommendations for Other Services       Precautions / Restrictions Precautions Precautions: Fall Restrictions LLE Weight Bearing: Weight bearing as tolerated      Mobility  Bed Mobility                  Transfers Overall transfer level: Needs assistance Equipment used: Rolling walker (2 wheeled) Transfers: Sit to/from Stand Sit to Stand: +2 physical assistance;Mod assist         General transfer comment: Heavy mod assist to power up; cues for hand placement, control, and safety  Ambulation/Gait Ambulation/Gait assistance: Mod assist;+2 physical assistance Ambulation Distance (Feet): 1 Feet (Initiated taking steps) Assistive device: Rolling walker (2 wheeled) Gait Pattern/deviations: Decreased stance time - left;Decreased step length - right     General Gait Details: Lots of difficulty accepting weight onto LLE for stance to allow for R LE stepping; Cues to bear lots of weight through UEs on RW to support LLE during R stepping; Able to  tolerate one step with R foot, and then lots of difficulty stepping R foot after that  Stairs            Wheelchair Mobility    Modified Rankin (Stroke Patients Only)       Balance     Sitting balance-Leahy Scale: Fair       Standing balance-Leahy Scale: Poor                               Pertinent Vitals/Pain Pain Assessment: 0-10 Pain Score: 7  Pain Location: LLE with mobility Pain Descriptors / Indicators: Grimacing;Guarding;Sore Pain Intervention(s): Monitored during session    Home Living                        Prior Function                 Hand Dominance        Extremity/Trunk Assessment                Communication      Cognition Arousal/Alertness: Awake/alert Behavior During Therapy: WFL for tasks assessed/performed Overall Cognitive Status: Within Functional Limits for tasks assessed                                        General Comments  Exercises Total Joint Exercises Ankle Circles/Pumps: AROM;Both;10 reps Long Arc Quad: AAROM;Left;10 reps;Seated   Assessment/Plan    PT Assessment    PT Problem List         PT Treatment Interventions      PT Goals (Current goals can be found in the Care Plan section)  Acute Rehab PT Goals Patient Stated Goal: to go to rehab and then home PT Goal Formulation: With patient Time For Goal Achievement: 07/07/17 Potential to Achieve Goals: Good    Frequency Min 3X/week   Barriers to discharge        Co-evaluation               AM-PAC PT "6 Clicks" Daily Activity  Outcome Measure Difficulty turning over in bed (including adjusting bedclothes, sheets and blankets)?: Unable Difficulty moving from lying on back to sitting on the side of the bed? : Unable Difficulty sitting down on and standing up from a chair with arms (e.g., wheelchair, bedside commode, etc,.)?: Unable Help needed moving to and from a bed to chair (including a  wheelchair)?: A Lot Help needed walking in hospital room?: A Lot Help needed climbing 3-5 steps with a railing? : Total 6 Click Score: 8    End of Session Equipment Utilized During Treatment: Gait belt;Oxygen Activity Tolerance: Patient limited by pain Patient left: in chair;with call bell/phone within reach;with chair alarm set Nurse Communication: Mobility status PT Visit Diagnosis: Other abnormalities of gait and mobility (R26.89);Muscle weakness (generalized) (M62.81);History of falling (Z91.81);Pain Pain - Right/Left: Left Pain - part of body: Knee;Hip;Leg    Time: 1412-1430 PT Time Calculation (min) (ACUTE ONLY): 18 min   Charges:     PT Treatments $Gait Training: 8-22 mins   PT G Codes:        Van Clines, PT  Acute Rehabilitation Services Pager [phone] Office [phone]   Levi Aland 06/26/2017, 4:06 PM

## 2017-06-26 NOTE — Consult Note (Signed)
Cardiology Consultation:   Patient ID: [name] [name]; [id number]; 05-11-1925   Admit date: 06/20/2017 Date of Consult: 06/26/2017  Primary Care Provider: Gaspar Garbe, MD Primary Cardiologist: Dr. Jacinto Halim    Patient Profile:   [name] [name] is a 83 y.o. male with a hx of  Permanent AFiib, Chronic CHF (felt to be predominantly R sided), pulmonary HTN, CAD (chronic RCA occlusion), DM, CRI (IV)who is being seen today for the evaluation of bradycardia at the request of Dr. Rosemary Holms .  History of Present Illness:   [name] [name] was admitted to [facility] 06/20/17 after a syncopal event resulting in L hip fx s/p arthroplasty 06/21/17.  Dr. Jacinto Halim has been following for his CHF, has been observed to have HR 40's at times.  The patient states he was taking peaches over to a neighbor when he became very dizzy, states he felt like he walked in circles for a couple times, and fainted, waking just has he hit the concrete. He states he vomited upon waking, did not appreciate any CP, palpitations before or after.  States his hip was hurting so much he could not tell any other symptoms, such as persistent weakness post syncope.  He has never fainted before, never has had bear syncope.   Past Medical History:  Diagnosis Date  . BPH with elevated PSA   . Chest pain   . Chronic kidney disease, stage III (moderate)   . DM (diabetes mellitus), type 2 with renal complications (HCC)   . ED (erectile dysfunction)   . Glaucoma   . Hyperlipidemia   . Hypertension   . Microalbuminuria   . Microalbuminuria 2013  . Obesity   . Osteoarthritis   . Prostatic hypertrophy, benign, with obstruction   . Renal calculus   . Sick sinus syndrome [facility])     Past Surgical History:  Procedure Laterality Date  . INTRAMEDULLARY (IM) NAIL INTERTROCHANTERIC Left 06/21/2017   Procedure: INTRAMEDULLARY (IM) NAIL INTERTROCHANTRIC Left Leg;  Surgeon: Sheral Apley, MD;  Location: [facility] OR;  Service: Orthopedics;   Laterality: Left;  . INTRAVASCULAR PRESSURE WIRE/FFR STUDY N/A 04/18/2017   Procedure: Intravascular Pressure Wire/FFR Study;  Surgeon: Yates Decamp, MD;  Location: [facility] INVASIVE CV LAB;  Service: Cardiovascular;  Laterality: N/A;  Prox LAD  . KNEE SURGERY    . REPLACEMENT TOTAL KNEE  02/26/01     Home Medications:  Prior to Admission medications   Medication Sig Start Date End Date Taking? Authorizing Provider  allopurinol (ZYLOPRIM) 100 MG tablet Take 100 mg by mouth daily.   Yes [provider]  brinzolamide (AZOPT) 1 % ophthalmic suspension Place 1 drop into both eyes at bedtime.    Yes [provider]  ELIQUIS 5 MG TABS tablet Take 2.5 mg by mouth 2 (two) times daily.  02/22/17  Yes [provider]  finasteride (PROSCAR) 5 MG tablet Take 5 mg by mouth daily.   Yes [provider]  glimepiride (AMARYL) 4 MG tablet Take 0.5 tablets (2 mg total) by mouth daily. 03/20/17  Yes Yates Decamp, MD  isosorbide mononitrate (IMDUR) 60 MG 24 hr tablet Take 60 mg by mouth daily. 02/19/16  Yes [provider]  losartan (COZAAR) 25 MG tablet Take 25 mg by mouth daily.   Yes [provider]  metolazone (ZAROXOLYN) 5 MG tablet Take 5 mg by mouth every morning.   Yes [provider]  metoprolol succinate (TOPROL-XL) 25 MG 24 hr tablet Take 25 mg by mouth daily.  05/13/17  Yes [provider]  MYRBETRIQ 50 MG TB24 tablet Take 50 mg by mouth daily. 11/27/15  Yes [provider]  nitroGLYCERIN (NITROSTAT) 0.4 MG SL tablet Place 1 tablet (0.4 mg total) under the tongue every 5 (five) minutes as needed for chest pain. 09/17/15  Yes Nahser, Deloris Ping, MD  potassium chloride (K-DUR) 10 MEQ tablet Take 10 mEq by mouth daily.   Yes [provider]  RAPAFLO 8 MG CAPS capsule Take 8 mg by mouth daily. 09/15/15  Yes [provider]  simvastatin (ZOCOR) 80 MG tablet Take 40 mg by mouth daily at 6 PM.    Yes [provider]    torsemide (DEMADEX) 20 MG tablet Take 1 tablet (20 mg total) by mouth 2 (two) times daily. Patient taking differently: Take 10 mg by mouth 2 (two) times daily.  03/20/17  Yes Yates Decamp, MD  travoprost, benzalkonium, (TRAVATAN) 0.004 % ophthalmic solution Place 1 drop into both eyes at bedtime.   Yes [provider]  HYDROcodone-acetaminophen (NORCO) 5-325 MG tablet Take 1 tablet by mouth every 6 (six) hours as needed for moderate pain. 06/21/17   Albina Billet III, PA-C  HYDROcodone-acetaminophen (NORCO) 5-325 MG tablet Take 1 tablet by mouth every 6 (six) hours as needed for moderate pain. 06/24/17   Cristie Hem, PA-C    Inpatient Medications: Scheduled Meds: . allopurinol  100 mg Oral Daily  . apixaban  2.5 mg Oral BID  . atorvastatin  40 mg Oral q1800  . brinzolamide  1 drop Both Eyes TID  . docusate sodium  100 mg Oral BID  . finasteride  5 mg Oral Daily  . insulin aspart  0-15 Units Subcutaneous TID WC  . iron polysaccharides  150 mg Oral Daily  . potassium chloride  40 mEq Oral Daily  . sildenafil  20 mg Oral TID  . tamsulosin  0.4 mg Oral Daily  . torsemide  20 mg Oral BID  . Travoprost (BAK Free)  1 drop Both Eyes QHS   Continuous Infusions: . lactated ringers 10 mL/hr at 06/22/17 1546  . sodium chloride     PRN Meds: fentaNYL (SUBLIMAZE) injection, HYDROcodone-acetaminophen, ondansetron (ZOFRAN) IV  Allergies:   No Known Allergies  Social History:   Social History   Social History  . Marital status: Married    Spouse name: N/A  . Number of children: N/A  . Years of education: N/A   Occupational History  . Not on file.   Social History Main Topics  . Smoking status: Former Smoker    Types: Cigarettes  . Smokeless tobacco: Never Used  . Alcohol use No  . Drug use: No  . Sexual activity: Not on file   Other Topics Concern  . Not on file   Social History Narrative  . No narrative on file    Family History:    Family History   Problem Relation Age of Onset  . Cancer Paternal Grandfather        STOMACH  . Cancer Sister        MALE CANCER  . Cancer - Colon Sister   . Stroke Father   . Healthy Child   . Healthy Child   . Other Child        BRAIN DAMAGE AT BIRTH     ROS:  Please see the history of present illness.  ROS  All other ROS reviewed and negative.     Physical Exam/Data:  Vitals:   06/25/17 0516 06/25/17 1834 06/25/17 2243 06/26/17 0522  BP: (!) 99/54 (!) 106/46 (!) 104/56 (!) 103/58  Pulse: 62 72 62 62  Resp: 18  (!) 22 20  Temp: 97.6 F (36.4 C) 97.9 F (36.6 C) 98.1 F (36.7 C) (!) 97.5 F (36.4 C)  TempSrc: Oral Oral Oral Oral  SpO2: 100% 95% 97% 98%  Weight:      Height:        Intake/Output Summary (Last 24 hours) at 06/26/17 1534 Last data filed at 06/26/17 1410  Gross per 24 hour  Intake              240 ml  Output              975 ml  Net             -735 ml   Filed Weights   06/22/17 1138 06/23/17 1306 06/24/17 0445  Weight: 238 lb 8 oz (108.2 kg) 235 lb 3.2 oz (106.7 kg) 235 lb 11.2 oz (106.9 kg)   Body mass index is 35.84 kg/m.  General:  Well nourished, well developed, in no acute distress HEENT: normal Lymph: no adenopathy Neck: no JVD Endocrine:  No thryomegaly Vascular: No carotid bruits  Cardiac:  IRRR; 1/6 SM Lungs:  CTA b/l, no wheezing, rhonchi or rales  Abd: soft, nontender, no hepatomegaly  Ext: significant edema to above knees b/l Musculoskeletal:  No deformities, age appropriate atrophy Skin: warm and dry  Neuro: no gross focal abnormalities noted, hard of hearing Psych:  Normal affect   EKG:  The EKG was personally reviewed and demonstrates:   #1 AFib 55bpm, QRS 92ms, QTc #2 AFib 64bpm, QRS 90, QTc Telemetry:  Telemetry was personally reviewed and demonstrates:  AFib, generally 50's-70's, transiently 40's,  I do not see sustained rates in 40's  Relevant CV Studies:  ECHO: 06/22/2017: Left ventricle: The cavity size was  mildly reduced. There was moderate concentric hypertrophy. The estimated ejection fraction was in the range of 55% to 60%. The study is not technically sufficient to allow evaluation of LV diastolic function. - Aortic valve: Mildly calcified annulus. Trileaflet; moderately calcified leaflets. There was mild stenosis. Valve area (VTI): 1.5 cm. Valve area (Vmax): 1.29 cm. Valve area (Vmean): 1.26 cm- Mitral valve: Moderately calcified annulus. Valve area bycontinuity equation (using LVOT flow): 1.7 cm^2. - Left atrium: The atrium was moderately dilated. - Right ventricle: The cavity size was mildly dilated. Wall thickness was normal. - Right atrium: The atrium was moderately dilated. - Tricuspid valve: There was moderate regurgitation.Pulmonary arteries: PA peak pressure: 43 mm Hg (S).  Laboratory Data:  Chemistry Recent Labs Lab 06/24/17 0750 06/25/17 0625 06/26/17 0704  NA 140 139 139  K 3.8 3.6 3.1*  CL 102 101 102  CO2 29 29 26   GLUCOSE 101* 133* 124*  BUN 69* 74* 74*  CREATININE 2.06* 2.23* 2.03*  CALCIUM 8.5* 8.6* 8.8*  GFRNONAA 27* 24* 27*  GFRAA 31* 28* 32*  ANIONGAP 9 9 11      Recent Labs Lab 06/21/17 0404  PROT 6.1*  ALBUMIN 3.0*  AST 26  ALT 16*  ALKPHOS 75  BILITOT 1.7*   Hematology Recent Labs Lab 06/21/17 2353 06/23/17 0712 06/24/17 0750 06/25/17 0625  WBC 6.1 6.8 5.1  --   RBC 3.42* 3.39* 3.18*  --   HGB 9.9* 9.7* 9.1* 8.7*  HCT 31.4* 30.5* 28.6* 27.3*  MCV 91.8 90.0 89.9  --  MCH 28.9 28.6 28.6  --   MCHC 31.5 31.8 31.8  --   RDW 16.7* 16.6* 16.3*  --   PLT 129* 142* 138*  --    Cardiac Enzymes Recent Labs Lab 06/21/17 1821 06/21/17 2353  TROPONINI 0.11* 0.11*    Recent Labs Lab 06/20/17 2055  TROPIPOC 0.10*    BNPNo results for input(s): BNP, PROBNP in the last 168 hours.  DDimer No results for input(s): DDIMER in the last 168 hours.  Radiology/Studies:  No results found.  Assessment and Plan:    1. Syncope      Felt at least partially 2/2 orthostatic hypotension, vagal     Concerns regarding his bradycardia, EP has been asked to evaluate for possible PPM  Toprol 25mg  daily out patient, not here Would keep off any rate limiting agents and agree worth discussing his flomax with urology  Recommend monitoring initially with 30 day monitor, might consider loop. Dr. Ladona Ridgel to see  2. CHF exacerbation     Negative cumulatively, c/w primary team     Remains with edema  3. Permanent AFib     CHA2DS2Vasc is at least 3, on Eliquis, appropriately dosed at 2.5 for age/creat   Signed, Sheilah Pigeon, PA-C  06/26/2017 3:34 PM   EP Attending  Patient seen and examined. Agree with above. The patient has syncope/falls. Unclear on the etiology. He has had some bradycardia on tele with nothing diagnostic. His exam reveals an elderly man, NAD. He has fractured his hip and undergone repair. He has an IRIR rhythm and scattered rales. Extremities demonstrate 2+ edema bilaterally. Neuro is grossly non-focal. Tele reveals atrial fib with a CVR and occaisional slow VR. I have discussed the treatment options. I would recommend watchful waiting with regard to PPM insertion. A 30 day monitor might be revealing. With his many comorbidities, and advanced age, I would try to avoid ILR.   Leonia Reeves.D.

## 2017-06-26 NOTE — Progress Notes (Signed)
PROGRESS NOTE    [name] [name]  ZOX:[id number] DOB: [birth year] DOA: 06/20/2017 PCP: Gaspar Garbe, MD    Brief Narrative:  Patient is a 83 year old gentleman history of chronic kidney disease stage III, diabetes type 2, CHF, BPH presenting to the ED after a syncopal episode with a fall and left hip pain. Patient with inability to bear weight. Patient noted to have a left hip fracture and underwent IM nail to the left hip per orthopedics. Postoperatively patient noted to be persistently hypotensive on pressors and critical care consulted. Patient was placed in the ICU unit. Patient subsequently weaned off pressors and transferred to the floor. Blood pressure borderline. Patient hypotension felt to be secondary to volume depletion. Patient now in acute on chronic diastolic heart failure/right-sided heart failure. Patient being followed by cardiology and placed on diuretics.   Assessment & Plan:   Principal Problem:   Hypotension Active Problems:   Syncope   Closed left hip fracture, initial encounter (HCC)   GERD   DM (diabetes mellitus), type 2 with renal complications (HCC)   Hypertension   Hyperlipidemia   Chronic kidney disease, stage III (moderate)   Atrial fibrillation (HCC)   Iron deficiency anemia   Elevated troponin   Closed fracture of left hip (HCC)   Bradycardia  #1 hypotension Likely secondary to hypovolemia. Patient with no infectious etiology. Doubt if patient had a cardiogenic shock. 2-D echo with a normal EF. Cortisol levels within normal limits. Patient was on anticoagulation prior to admission and PEs unlikely. Patient was initially placed on pressors and in the critical care unit.  Pressors have been weaned off. Blood pressure improved with hydration however borderline. Continue to hold antihypertensive medications as patient has been started back on diuretics due to acute CHF exacerbation. Follow.  #2 acute on chronic diastolic heart failure /right-sided  heart failure   Patient volume overloaded on examination which is slowly improving, likely secondary to right heart failure in the setting of aggressive fluid resuscitation due to hypotension. 2-D echo consistent with right heart failure with moderate pulmonary hypertension and EF of 55-60%. Patient with lower extremity edema. Patient with a urine output of 900 mL over the past 24 hours. Patient has been seen by cardiology and patient started on diuretics with holding parameters. Patient also started on sildenafil per cardiology. Strict I's and O's. Daily weights. Continue to hold antihypertensive medications on hold secondary to borderline blood pressure. Cardiology following and appreciate input and recommendations.  #3 syncope Patient had a syncopal episode prior to admission leading to his fall and left hip fracture. Syncopal episode secondary to volume depletion versus cardiac in nature as patient noted to be bradycardic in the 40s and on no AV nodal blocking agents per cardiology. Patient noted to be significantly hypotensive postoperatively and had to be placed on pressors. Currently off pressors.  Cardiac enzymes mildly elevated. 2-D echo with normal EF, no wall motion abnormalities, moderate pulmonary hypertension. Patient has been hydrated with IV fluids. Antihypertensive medications on hold. Patient's diuretics have an resumed secondary to acute CHF exacerbation. No further syncopal episodes. Cardiology has consulted EP for further evaluation of patient's bradycardia as they have been etiology of syncope and to see whether patient is a candidate for pacemaker placement.  Patient has been seen by EP PA and attending note pending.   #4 atrial fibrillation CHA2DS2VASC 5 Rate controlled. Continue Eliquis for anticoagulation.  #5 iron deficiency anemia Anemia panel consistent with iron deficiency anemia. Hemoglobin stable. Patient with no  overt bleeding. H&H stable.   #6 elevated  troponin Questionable etiology. Likely secondary to volume overload. Hypotension improved. Patient now with hypotension. 2-D echo with a EF of 55-60% with increased right ventricular systolic pressure consistent with moderate pulmonary hypertension. Cardiology following.  #7 closed left hip fracture Secondary to mechanical fall secondary to syncope. Patient status post IM nail per orthopedics. Per orthopedics.  #8 well-controlled diabetes mellitus type 2 Hemoglobin A1c 6.2. CBGs have ranged from 97 - 124. Sliding scale insulin.   #9 hypertension Continue to hold antihypertensive medications. Blood pressure borderline.   #10 hyperlipidemia Continue statin.   #11 BPH Continue Flomax, Proscar, uroxatral. Outpatient follow-up.  #12 chronic kidney disease stage III/IV Stable. Monitor closely with diuresis.   DVT prophylaxis: eliquis. Code Status: Full Family Communication: Updated patient. No family at bedside. Disposition Plan: Remain on telemetry. Likely skilled nursing facility once cardiac issues have improved and per orthopedics and cardiology.   Consultants:   Orthopedics: Dr. Margarita Rana 06/21/2017  PCCM Dr. Jamison Neighbor 06/21/2017   Cardiology: Dr. Jacinto Halim 06/23/2017   Electrophysiology  Procedures:   CT head CT /C-spine 06/20/2017  Chest x-ray 06/20/2017  IM nail intertrochanteric LLE --per Dr. Margarita Rana 06/21/2017  Antimicrobials:   None   Subjective: Patient sitting in chair. No chest pain. Shortness of breath improving daily.   Objective: Vitals:   06/25/17 1834 06/25/17 2243 06/26/17 0522 06/26/17 1716  BP: (!) 106/46 (!) 104/56 (!) 103/58 (!) 104/53  Pulse: 72 62 62 (!) 59  Resp:  (!) 22 20 16   Temp: 97.9 F (36.6 C) 98.1 F (36.7 C) (!) 97.5 F (36.4 C) 97.6 F (36.4 C)  TempSrc: Oral Oral Oral Oral  SpO2: 95% 97% 98% 97%  Weight:      Height:        Intake/Output Summary (Last 24 hours) at 06/26/17 1954 Last data filed at 06/26/17  1934  Gross per 24 hour  Intake              240 ml  Output             1275 ml  Net            -1035 ml   Filed Weights   06/22/17 1138 06/23/17 1306 06/24/17 0445  Weight: 108.2 kg (238 lb 8 oz) 106.7 kg (235 lb 3.2 oz) 106.9 kg (235 lb 11.2 oz)    Examination:  General exam: In chair Respiratory system: Bibasilar crackles. No wheezing Cardiovascular system: Irregularly irregular. Bradycardia. +  JVD, murmurs, rubs, gallops or clicks. 1-2+ bilateral lower extremity edema. Gastrointestinal system: Abdomen is soft, nontender, nondistended, positive bowel sounds.  Central nervous system: Alert and oriented. No focal neurological deficits.  Extremities: Symmetric 5 x 5 power. Skin: No rashes, lesions or ulcers Psychiatry: Judgement and insight appear fair. Mood & affect appropriate.     Data Reviewed: I have personally reviewed following labs and imaging studies  CBC:  Recent Labs Lab 06/20/17 2054  06/21/17 1252 06/21/17 2353 06/23/17 0712 06/24/17 0750 06/25/17 0625  WBC 5.5  --  9.0 6.1 6.8 5.1  --   NEUTROABS 4.3  --  8.1*  --   --   --   --   HGB 9.9*  < > 10.9* 9.9* 9.7* 9.1* 8.7*  HCT 31.1*  < > 34.9* 31.4* 30.5* 28.6* 27.3*  MCV 91.7  --  92.3 91.8 90.0 89.9  --   PLT 121*  --  144* 129* 142* 138*  --   < > =  values in this interval not displayed. Basic Metabolic Panel:  Recent Labs Lab 06/21/17 2353 06/23/17 0712 06/24/17 0750 06/25/17 0625 06/26/17 0704 06/26/17 1440  NA 141 141 140 139 139 140  K 4.0 4.0 3.8 3.6 3.1* 3.6  CL 106 107 102 101 102 101  CO2 23 24 29 29 26 29   GLUCOSE 202* 81 101* 133* 124* 102*  BUN 57* 72* 69* 74* 74* 75*  CREATININE 2.24* 2.15* 2.06* 2.23* 2.03* 1.95*  CALCIUM 8.3* 8.6* 8.5* 8.6* 8.8* 8.7*  MG 1.9 2.1 2.0  --   --   --   PHOS 4.5 3.9  --   --   --   --    GFR: Estimated Creatinine Clearance: 29.8 mL/min (A) (by C-G formula based on SCr of 1.95 mg/dL (H)). Liver Function Tests:  Recent Labs Lab 06/21/17 0404   AST 26  ALT 16*  ALKPHOS 75  BILITOT 1.7*  PROT 6.1*  ALBUMIN 3.0*   No results for input(s): LIPASE, AMYLASE in the last 168 hours. No results for input(s): AMMONIA in the last 168 hours. Coagulation Profile: No results for input(s): INR, PROTIME in the last 168 hours. Cardiac Enzymes:  Recent Labs Lab 06/21/17 1821 06/21/17 2353  TROPONINI 0.11* 0.11*   BNP (last 3 results) No results for input(s): PROBNP in the last 8760 hours. HbA1C:  Recent Labs  06/25/17 0625  HGBA1C 6.2*   CBG:  Recent Labs Lab 06/25/17 1249 06/25/17 1700 06/26/17 0835 06/26/17 1235 06/26/17 1643  GLUCAP 142* 128* 124* 118* 97   Lipid Profile: No results for input(s): CHOL, HDL, LDLCALC, TRIG, CHOLHDL, LDLDIRECT in the last 72 hours. Thyroid Function Tests: No results for input(s): TSH, T4TOTAL, FREET4, T3FREE, THYROIDAB in the last 72 hours. Anemia Panel: No results for input(s): VITAMINB12, FOLATE, FERRITIN, TIBC, IRON, RETICCTPCT in the last 72 hours. Sepsis Labs:  Recent Labs Lab 06/20/17 2058  LATICACIDVEN 2.11*    Recent Results (from the past 240 hour(s))  MRSA PCR Screening     Status: None   Collection Time: 06/21/17  5:47 AM  Result Value Ref Range Status   MRSA by PCR NEGATIVE NEGATIVE Final    Comment:        The GeneXpert MRSA Assay (FDA approved for NASAL specimens only), is one component of a comprehensive MRSA colonization surveillance program. It is not intended to diagnose MRSA infection nor to guide or monitor treatment for MRSA infections.          Radiology Studies: No results found.      Scheduled Meds: . allopurinol  100 mg Oral Daily  . apixaban  2.5 mg Oral BID  . atorvastatin  40 mg Oral q1800  . brinzolamide  1 drop Both Eyes TID  . docusate sodium  100 mg Oral BID  . finasteride  5 mg Oral Daily  . insulin aspart  0-15 Units Subcutaneous TID WC  . iron polysaccharides  150 mg Oral Daily  . potassium chloride  40 mEq Oral  Daily  . sildenafil  20 mg Oral TID  . tamsulosin  0.4 mg Oral Daily  . torsemide  20 mg Oral BID  . Travoprost (BAK Free)  1 drop Both Eyes QHS   Continuous Infusions: . lactated ringers 10 mL/hr at 06/22/17 1546  . sodium chloride       LOS: 5 days    Time spent: 35 mins    Abhiram Criado, MD [facility] Pager [phone]  If 7PM-7AM, please  contact night-coverage [email or web address] Password [facility] 06/26/2017, 7:54 PM

## 2017-06-27 ENCOUNTER — Encounter (HOSPITAL_COMMUNITY)
Admission: EM | Disposition: A | Discharge status: Skilled Nursing Facility | Payer: Self-pay | Source: Home / Self Care | Attending: Internal Medicine

## 2017-06-27 DIAGNOSIS — R55 Syncope and collapse: Secondary | ICD-10-CM

## 2017-06-27 DIAGNOSIS — S72142A Displaced intertrochanteric fracture of left femur, initial encounter for closed fracture: Secondary | ICD-10-CM

## 2017-06-27 DIAGNOSIS — S72142D Displaced intertrochanteric fracture of left femur, subsequent encounter for closed fracture with routine healing: Secondary | ICD-10-CM

## 2017-06-27 DIAGNOSIS — I482 Chronic atrial fibrillation: Secondary | ICD-10-CM

## 2017-06-27 HISTORY — PX: LOOP RECORDER INSERTION: EP1214

## 2017-06-27 LAB — BASIC METABOLIC PANEL
ANION GAP: 10 (ref 5–15)
BUN: 73 mg/dL — ABNORMAL HIGH (ref 6–20)
CHLORIDE: 101 mmol/L (ref 101–111)
CO2: 29 mmol/L (ref 22–32)
CREATININE: 1.83 mg/dL — AB (ref 0.61–1.24)
Calcium: 8.8 mg/dL — ABNORMAL LOW (ref 8.9–10.3)
GFR calc non Af Amer: 31 mL/min — ABNORMAL LOW (ref >60)
GFR, EST AFRICAN AMERICAN: 36 mL/min — AB (ref >60)
Glucose, Bld: 127 mg/dL — ABNORMAL HIGH (ref 65–99)
POTASSIUM: 3.6 mmol/L (ref 3.5–5.1)
SODIUM: 140 mmol/L (ref 135–145)

## 2017-06-27 LAB — GLUCOSE, CAPILLARY
GLUCOSE-CAPILLARY: 111 mg/dL — AB (ref 65–99)
Glucose-Capillary: 161 mg/dL — ABNORMAL HIGH (ref 65–99)
Glucose-Capillary: 126 mg/dL — ABNORMAL HIGH (ref 65–99)

## 2017-06-27 LAB — SURGICAL PCR SCREEN
MRSA, PCR: NEGATIVE
STAPHYLOCOCCUS AUREUS: NEGATIVE

## 2017-06-27 SURGERY — LOOP RECORDER INSERTION

## 2017-06-27 MED ORDER — LIDOCAINE-EPINEPHRINE 1 %-1:100000 IJ SOLN
INTRAMUSCULAR | Status: DC | PRN
  Administered 2017-06-27: 20 mL

## 2017-06-27 MED ORDER — LIDOCAINE-EPINEPHRINE 1 %-1:100000 IJ SOLN
INTRAMUSCULAR | Status: AC
  Filled 2017-06-27: qty 1

## 2017-06-27 SURGICAL SUPPLY — 2 items
LOOP REVEAL LINQSYS (Prosthesis & Implant Heart) ×2 IMPLANT
PACK LOOP INSERTION (CUSTOM PROCEDURE TRAY) ×2 IMPLANT

## 2017-06-27 NOTE — Consult Note (Signed)
Physical Medicine and Rehabilitation Consult Reason for Consult: Decreased functional mobility after fall Referring Physician: [facility]   HPI: [name] [name] is a 83 y.o. right handed male with history of CKD stage III, diabetes mellitus, atrial fibrillation maintained on eliquis. Patient lives alone and still driving. Wife recently passed away 17-Dec-2016. He has a supportive daughter. Presented 06/21/2017 after a fall question related to syncope landing on his left hip no loss of consciousness. Cranial CT scan negative. X-rays and imaging revealed a left intertrochanteric hip fracture. Underwent IM nailing left hip 06/21/2017 per Dr. Margarita Rana. Hospital course pain management. Cardiology service is consulted for bradycardia in the 40s. Await planned for pacemaker/loop recorder. Patient is weightbearing as tolerated left lower extremity. Acute blood loss anemia 9.1 and monitored. Physical therapy evaluation completed 06/26/2017 with recommendations of physical medicine rehabilitation consult.  The patient indicates that the daughter takes care of her disabled husband. She can assist with groceries, light housekeeping, but is not available to transfer to the toilet and provide other ADL assistance. Patient thinks he'll be able to do this when he leaves the hospital. We discussed the healing process of his left hip that may take several months  Review of Systems  Constitutional: Negative for chills and fever.  HENT: Negative for hearing loss.   Eyes: Negative for blurred vision and double vision.  Respiratory: Negative for cough and shortness of breath.   Cardiovascular: Positive for palpitations and leg swelling.  Gastrointestinal: Positive for constipation. Negative for nausea and vomiting.  Genitourinary: Positive for urgency. Negative for hematuria.  Musculoskeletal: Positive for falls and myalgias.  Neurological: Negative for seizures and weakness.  All other systems reviewed  and are negative.  Past Medical History:  Diagnosis Date  . BPH with elevated PSA   . Chest pain   . Chronic kidney disease, stage III (moderate)   . DM (diabetes mellitus), type 2 with renal complications (HCC)   . ED (erectile dysfunction)   . Glaucoma   . Hyperlipidemia   . Hypertension   . Microalbuminuria   . Microalbuminuria 2013  . Obesity   . Osteoarthritis   . Prostatic hypertrophy, benign, with obstruction   . Renal calculus   . Sick sinus syndrome [facility])    Past Surgical History:  Procedure Laterality Date  . INTRAMEDULLARY (IM) NAIL INTERTROCHANTERIC Left 06/21/2017   Procedure: INTRAMEDULLARY (IM) NAIL INTERTROCHANTRIC Left Leg;  Surgeon: Sheral Apley, MD;  Location: [facility] OR;  Service: Orthopedics;  Laterality: Left;  . INTRAVASCULAR PRESSURE WIRE/FFR STUDY N/A 04/18/2017   Procedure: Intravascular Pressure Wire/FFR Study;  Surgeon: Yates Decamp, MD;  Location: [facility] INVASIVE CV LAB;  Service: Cardiovascular;  Laterality: N/A;  Prox LAD  . KNEE SURGERY    . REPLACEMENT TOTAL KNEE  02/26/01   Family History  Problem Relation Age of Onset  . Cancer Paternal Grandfather        STOMACH  . Cancer Sister        MALE CANCER  . Cancer - Colon Sister   . Stroke Father   . Healthy Child   . Healthy Child   . Other Child        BRAIN DAMAGE AT BIRTH   Social History:  reports that he has quit smoking. His smoking use included Cigarettes. He has never used smokeless tobacco. He reports that he does not drink alcohol or use drugs. Allergies: No Known Allergies Medications Prior to Admission  Medication Sig Dispense Refill  .  allopurinol (ZYLOPRIM) 100 MG tablet Take 100 mg by mouth daily.    . brinzolamide (AZOPT) 1 % ophthalmic suspension Place 1 drop into both eyes at bedtime.     Marland Kitchen ELIQUIS 5 MG TABS tablet Take 2.5 mg by mouth 2 (two) times daily.     . finasteride (PROSCAR) 5 MG tablet Take 5 mg by mouth daily.    Marland Kitchen glimepiride (AMARYL) 4 MG tablet Take 0.5 tablets  (2 mg total) by mouth daily.    . isosorbide mononitrate (IMDUR) 60 MG 24 hr tablet Take 60 mg by mouth daily.    Marland Kitchen losartan (COZAAR) 25 MG tablet Take 25 mg by mouth daily.    . metolazone (ZAROXOLYN) 5 MG tablet Take 5 mg by mouth every morning.    . metoprolol succinate (TOPROL-XL) 25 MG 24 hr tablet Take 25 mg by mouth daily.  1  . MYRBETRIQ 50 MG TB24 tablet Take 50 mg by mouth daily.    . nitroGLYCERIN (NITROSTAT) 0.4 MG SL tablet Place 1 tablet (0.4 mg total) under the tongue every 5 (five) minutes as needed for chest pain. 25 tablet 6  . potassium chloride (K-DUR) 10 MEQ tablet Take 10 mEq by mouth daily.    Marland Kitchen RAPAFLO 8 MG CAPS capsule Take 8 mg by mouth daily.    . simvastatin (ZOCOR) 80 MG tablet Take 40 mg by mouth daily at 6 PM.     . torsemide (DEMADEX) 20 MG tablet Take 1 tablet (20 mg total) by mouth 2 (two) times daily. (Patient taking differently: Take 10 mg by mouth 2 (two) times daily. ) 30 tablet 0  . travoprost, benzalkonium, (TRAVATAN) 0.004 % ophthalmic solution Place 1 drop into both eyes at bedtime.      Home: Home Living Family/patient expects to be discharged to:: Skilled nursing facility Living Arrangements: Children, Non-relatives/Friends  Functional History: Prior Function Level of Independence: Independent Comments: pt was completely independent prior to admission Functional Status:  Mobility: Bed Mobility Overal bed mobility: Needs Assistance Bed Mobility: Supine to Sit Supine to sit: +2 for physical assistance, Mod assist, HOB elevated Sit to supine: Max assist, +2 for physical assistance General bed mobility comments: assist for trunk and LLE. Cues given Transfers Overall transfer level: Needs assistance Equipment used: Rolling walker (2 wheeled) Transfers: Sit to/from Stand Sit to Stand: +2 physical assistance, Mod assist Stand pivot transfers: +2 physical assistance, Total assist General transfer comment: Heavy mod assist to power up; cues for  hand placement, control, and safety Ambulation/Gait Ambulation/Gait assistance: Mod assist, +2 physical assistance Ambulation Distance (Feet): 1 Feet (Initiated taking steps) Assistive device: Rolling walker (2 wheeled) Gait Pattern/deviations: Decreased stance time - left, Decreased step length - right General Gait Details: Lots of difficulty accepting weight onto LLE for stance to allow for R LE stepping; Cues to bear lots of weight through UEs on RW to support LLE during R stepping; Able to tolerate one step with R foot, and then lots of difficulty stepping R foot after that    ADL: ADL Overall ADL's : Needs assistance/impaired Lower Body Dressing: Total assistance, Sit to/from stand, +2 for physical assistance Toilet Transfer: +2 for physical assistance, Total assistance, Squat-pivot Functional mobility during ADLs: +2 for physical assistance, Total assistance (+2 Total-squat pivot; +2 Mod-initial stand with RW) General ADL Comments: Explained that AE is available to assist with LB ADLs.   Cognition: Cognition Overall Cognitive Status: Within Functional Limits for tasks assessed Orientation Level: Oriented to person, Oriented to  place, Oriented to situation, Disoriented to time Cognition Arousal/Alertness: Awake/alert Behavior During Therapy: WFL for tasks assessed/performed Overall Cognitive Status: Within Functional Limits for tasks assessed  Blood pressure (!) 101/49, pulse (!) 59, temperature 97.7 F (36.5 C), temperature source Oral, resp. rate 18, height 5\' 8"  (1.727 m), weight 106.9 kg (235 lb 11.2 oz), SpO2 96 %. Physical Exam  Vitals reviewed. Constitutional: He is oriented to person, place, and time.  HENT:  Head: Normocephalic.  Eyes: EOM are normal.  Neck: Normal range of motion. Neck supple. No thyromegaly present.  Cardiovascular:  Irregular irregular in the 60s-70s  Respiratory: Effort normal and breath sounds normal. No respiratory distress.  GI: Soft. Bowel  sounds are normal. He exhibits no distension.  Neurological: He is alert and oriented to person, place, and time.  Skin:  Left hip incision is dressed appropriately tender  Motor strength is 3 minus in the right deltoid, 5 in the right biceps, triceps, finger flexors 5 on the left deltoid by stress of grip, 5 at the right hip flexor, knee extensor, ankle dorsiflexor 3 minus at the left hip flexor, knee extensor 5 at the ankle dorsiflexor, plantar flexor, pain inhibition on the left  Results for orders placed or performed during the hospital encounter of 06/20/17 (from the past 24 hour(s))  Basic metabolic panel     Status: Abnormal   Collection Time: 06/26/17  2:40 PM  Result Value Ref Range   Sodium 140 135 - 145 mmol/L   Potassium 3.6 3.5 - 5.1 mmol/L   Chloride 101 101 - 111 mmol/L   CO2 29 22 - 32 mmol/L   Glucose, Bld 102 (H) 65 - 99 mg/dL   BUN 75 (H) 6 - 20 mg/dL   Creatinine, Ser 4.09 (H) 0.61 - 1.24 mg/dL   Calcium 8.7 (L) 8.9 - 10.3 mg/dL   GFR calc non Af Amer 29 (L) >60 mL/min   GFR calc Af Amer 33 (L) >60 mL/min   Anion gap 10 5 - 15  Glucose, capillary     Status: None   Collection Time: 06/26/17  4:43 PM  Result Value Ref Range   Glucose-Capillary 97 65 - 99 mg/dL  Glucose, capillary     Status: Abnormal   Collection Time: 06/26/17  9:34 PM  Result Value Ref Range   Glucose-Capillary 132 (H) 65 - 99 mg/dL  Basic metabolic panel     Status: Abnormal   Collection Time: 06/27/17  3:11 AM  Result Value Ref Range   Sodium 140 135 - 145 mmol/L   Potassium 3.6 3.5 - 5.1 mmol/L   Chloride 101 101 - 111 mmol/L   CO2 29 22 - 32 mmol/L   Glucose, Bld 127 (H) 65 - 99 mg/dL   BUN 73 (H) 6 - 20 mg/dL   Creatinine, Ser 8.11 (H) 0.61 - 1.24 mg/dL   Calcium 8.8 (L) 8.9 - 10.3 mg/dL   GFR calc non Af Amer 31 (L) >60 mL/min   GFR calc Af Amer 36 (L) >60 mL/min   Anion gap 10 5 - 15  Glucose, capillary     Status: Abnormal   Collection Time: 06/27/17  8:01 AM  Result Value Ref  Range   Glucose-Capillary 111 (H) 65 - 99 mg/dL  Glucose, capillary     Status: Abnormal   Collection Time: 06/27/17 12:12 PM  Result Value Ref Range   Glucose-Capillary 161 (H) 65 - 99 mg/dL   No results found.  Assessment/Plan: Diagnosis: Left  intertrochanteric hip fracture, status post IM nail, postop day #7 1. Does the need for close, 24 hr/day medical supervision in concert with the patient's rehab needs make it unreasonable for this patient to be served in a less intensive setting? Potentially 2. Co-Morbidities requiring supervision/potential complications: Diabetes, atrial fibrillation, chronic kidney disease to bathing in the bathtub. We discussed that this may take several months to improve after this type of fracture. 3. Due to bladder management, bowel management, safety, skin/wound care, disease management, medication administration, pain management and patient education, does the patient require 24 hr/day rehab nursing? Yes 4. Does the patient require coordinated care of a physician, rehab nurse, PT (1-2 hrs/day, 5 days/week) and OT (1-2 hrs/day, 5 days/week) to address physical and functional deficits in the context of the above medical diagnosis(es)? Yes Addressing deficits in the following areas: balance, endurance, locomotion, strength, transferring, bowel/bladder control, bathing, dressing, toileting and psychosocial support 5. Can the patient actively participate in an intensive therapy program of at least 3 hrs of therapy per day at least 5 days per week? Potentially 6. The potential for patient to make measurable gains while on inpatient rehab is fair 7. Anticipated functional outcomes upon discharge from inpatient rehab are min assist  with PT, min assist with OT, n/a with SLP. 8. Estimated rehab length of stay to reach the above functional goals is: 6wks 9. Anticipated D/C setting: Home 10. Anticipated post D/C treatments: HH therapy 11. Overall Rehab/Functional  Prognosis: fair  RECOMMENDATIONS: This patient's condition is appropriate for continued rehabilitative care in the following setting: SNF Patient has agreed to participate in recommended program. Potentially Note that insurance prior authorization may be required for reimbursement for recommended care.  Comment: Patient has unrealistic expectations in terms of the recovery process. Thinks he can go home and get in and out of bathtub.  Discussed longer duration of recovery and need for 24/7 care.  Do not think pt would make gains very quickly given age and diagnosis  Erick Colace M.D. [facility] FAAPM&R (Sports Med, Neuromuscular Med) Diplomate Am Board of Electrodiagnostic Med  Charlton Amor., PA-C 06/27/2017

## 2017-06-27 NOTE — Progress Notes (Signed)
Patient went to cath lab for implant loop.

## 2017-06-27 NOTE — Progress Notes (Signed)
Dr. Rosemary Holms called asked to implant loop.  I discussed with the patient and is daughter at bedside, rational for the implant, alternative with holter/event monitors, the implant procedure, risks and benefits and the patient and family would like to proceed.  We will plan for today.  Wound care was discussed and wound check visit will be arranged.  Francis Dowse, PA-C  Risks and benefits of ILR were discussed with the patient who wishes to proceed.  Hillis Range MD, [facility] 06/27/2017 3:33 PM

## 2017-06-27 NOTE — Progress Notes (Signed)
Occupational Therapy Treatment Patient Details Name: [name] [name] MRN: [id number] DOB: April 16, 1925 Today's Date: 06/27/2017    History of present illness Pt is an 83 y.o. male admitted through ED on 06/20/17 following a syncopal episode related to hypotension resulting in a fall and L hip fx. Pt underwent an IM nail on 06/21/17. Pt has continued to have issues with hypotension which has limited his ability to mobilize. PMH significant for CKD3, DM2, chronic diastolic HF, HLD, HTN.    OT comments  Pt immediately agreeable to working with therapy on OOB activities. Pt limited by L hip pain and difficulty tolerating weight bearing during ambulation. 02 sats remained in 90s on RA during activity, RN made aware. Pt continues to require set up to total assist for ADL and 2 person assist for all mobility. Continues to be appropriate for SNF level rehab. Will continue to follow.  Follow Up Recommendations  SNF    Equipment Recommendations       Recommendations for Other Services      Precautions / Restrictions Precautions Precautions: Fall Restrictions LLE Weight Bearing: Weight bearing as tolerated       Mobility Bed Mobility Overal bed mobility: Needs Assistance Bed Mobility: Supine to Sit     Supine to sit: Mod assist;+2 for physical assistance;HOB elevated     General bed mobility comments: assist for trunk and LLE. Cues given for technique, increased time and heavy use of rail  Transfers Overall transfer level: Needs assistance Equipment used: Rolling walker (2 wheeled) Transfers: Sit to/from Stand Sit to Stand: +2 physical assistance;Mod assist         General transfer comment: cues for hand placement, bed height elevated, assist to rise and steady    Balance Overall balance assessment: Needs assistance   Sitting balance-Leahy Scale: Fair     Standing balance support: Bilateral upper extremity supported Standing balance-Leahy Scale: Poor Standing balance  comment: unable to release walker in static standing                           ADL either performed or assessed with clinical judgement   ADL Overall ADL's : Needs assistance/impaired                 Upper Body Dressing : Minimal assistance;Sitting   Lower Body Dressing: Total assistance;Bed level   Toilet Transfer: +2 for physical assistance;Moderate assistance;Ambulation;BSC;RW Toilet Transfer Details (indicate cue type and reason): simulated to chair Toileting- Clothing Manipulation and Hygiene: Total assistance;Sit to/from stand       Functional mobility during ADLs: +2 for physical assistance;Moderate assistance;Rolling walker;Cueing for sequencing (difficulty accepting weight on L UE)       Vision       Perception     Praxis      Cognition Arousal/Alertness: Awake/alert Behavior During Therapy: WFL for tasks assessed/performed Overall Cognitive Status: Within Functional Limits for tasks assessed                                          Exercises     Shoulder Instructions       General Comments      Pertinent Vitals/ Pain       Pain Assessment: 0-10 Pain Score: 7  Pain Location: LLE with mobility Pain Descriptors / Indicators: Grimacing;Guarding;Sore Pain Intervention(s): Patient requesting pain meds-RN notified;RN gave pain  meds during session;Monitored during session;Repositioned  Home Living                                          Prior Functioning/Environment              Frequency  Min 2X/week        Progress Toward Goals  OT Goals(current goals can now be found in the care plan section)  Progress towards OT goals: Progressing toward goals  Acute Rehab OT Goals Patient Stated Goal: to go to rehab and then home OT Goal Formulation: With patient Time For Goal Achievement: 07/01/17 Potential to Achieve Goals: Good  Plan Discharge plan remains appropriate    Co-evaluation     PT/OT/SLP Co-Evaluation/Treatment: Yes Reason for Co-Treatment: For patient/therapist safety   OT goals addressed during session: ADL's and self-care      AM-PAC PT "6 Clicks" Daily Activity     Outcome Measure   Help from another person eating meals?: None Help from another person taking care of personal grooming?: A Little Help from another person toileting, which includes using toliet, bedpan, or urinal?: Total Help from another person bathing (including washing, rinsing, drying)?: A Lot Help from another person to put on and taking off regular upper body clothing?: A Little Help from another person to put on and taking off regular lower body clothing?: Total 6 Click Score: 14    End of Session Equipment Utilized During Treatment: Gait belt;Rolling walker  OT Visit Diagnosis: Muscle weakness (generalized) (M62.81);Pain Pain - Right/Left: Left Pain - part of body: Leg   Activity Tolerance Patient limited by fatigue;Patient limited by pain   Patient Left in chair;with call bell/phone within reach;with chair alarm set;with nursing/sitter in room   Nurse Communication Patient requests pain meds;Other (comment) (02 sats 97% following ambulation on RA)        Time: 7412-8786 OT Time Calculation (min): 28 min  Charges: OT General Charges $OT Visit: 1 Visit OT Treatments $Therapeutic Activity: 8-22 mins     Evern Bio 06/27/2017, 3:37 PM 06/27/2017 Martie Round, OTR/L Pager: [phone]

## 2017-06-27 NOTE — Progress Notes (Signed)
PROGRESS NOTE    [name] [name]  JYN:[id number] DOB: 1925-07-19 DOA: 06/20/2017 PCP: Gaspar Garbe, MD    Brief Narrative:  Patient is a 83 year old gentleman history of chronic kidney disease stage III, diabetes type 2, CHF, BPH presenting to the ED after a syncopal episode with a fall and left hip pain. Patient with inability to bear weight. Patient noted to have a left hip fracture and underwent IM nail to the left hip per orthopedics. Postoperatively patient noted to be persistently hypotensive on pressors and critical care consulted. Patient was placed in the ICU unit. Patient subsequently weaned off pressors and transferred to the floor. Blood pressure borderline. Patient hypotension felt to be secondary to volume depletion. Patient now in acute on chronic diastolic heart failure/right-sided heart failure. Patient being followed by cardiology and placed on diuretics.   Assessment & Plan:   Principal Problem:   Hypotension Active Problems:   Syncope   Closed left hip fracture, initial encounter (HCC)   GERD   DM (diabetes mellitus), type 2 with renal complications (HCC)   Hypertension   Hyperlipidemia   Chronic kidney disease, stage III (moderate)   Atrial fibrillation (HCC)   Iron deficiency anemia   Elevated troponin   Closed fracture of left hip (HCC)   Bradycardia  #1 hypotension Likely secondary to hypovolemia. Patient with no infectious etiology. Doubt if patient had a cardiogenic shock. 2-D echo with a normal EF. Cortisol levels within normal limits. Patient was on anticoagulation prior to admission and PEs unlikely. Patient was initially placed on pressors and in the critical care unit.  Pressors have been weaned off. Blood pressure improved with hydration however borderline. Continue to hold antihypertensive medications as patient has been started back on diuretics due to acute CHF exacerbation. Patient also noted to be on the lowest dose of Flomax. Flomax  discussed with urology who felt he was okay for Flomax to be discontinued as patient was also on finasteride. Will DC Flomax for now.   #2 acute on chronic diastolic heart failure /right-sided heart failure   Patient still volume overloaded on examination which is slowly improving, likely secondary to right heart failure in the setting of aggressive fluid resuscitation due to hypotension. 2-D echo consistent with right heart failure with moderate pulmonary hypertension and EF of 55-60%. Patient with lower extremity edema. Patient with a urine output of 2175 mL over the past 24 hours. Patient has been seen by cardiology and patient restarted on diuretics with holding parameters. Patient also started on sildenafil per cardiology. Strict I's and O's. Daily weights. Continue to hold antihypertensive medication secondary to borderline blood pressure. Patient on Flomax at lowest-dose. Patient unable to state who his urologist is. Case discussed with urology on-call who felt it was okay for patient's Flomax to be discontinued as patient was already on finasteride. Will DC Flomax for now. Cardiology following and appreciate input and recommendations.  #3 syncope Patient had a syncopal episode prior to admission leading to his fall and left hip fracture. Syncopal episode secondary to volume depletion versus cardiac in nature as patient noted to be bradycardic in the 40s and on no AV nodal blocking agents per cardiology. Patient noted to be significantly hypotensive postoperatively and had to be placed on pressors. Currently off pressors.  Cardiac enzymes mildly elevated. 2-D echo with normal EF, no wall motion abnormalities, moderate pulmonary hypertension. Patient has been hydrated with IV fluids. Antihypertensive medications on hold. Patient's diuretics have an resumed secondary to acute CHF  exacerbation. No further syncopal episodes. Cardiology has consulted EP for further evaluation of patient's bradycardia as  they may have been etiology of syncope and to see whether patient is a candidate for pacemaker placement.  Patient has been seen by EP, were recommending watchful waiting with regard to pacemaker insertion. EP also recommending 30 day monitor might give more information. Cardiology in discussions with EP has decided to place a loop recorder.   #4 atrial fibrillation CHA2DS2VASC 5 Rate controlled. Continue Eliquis for anticoagulation.  #5 iron deficiency anemia Anemia panel consistent with iron deficiency anemia. Hemoglobin stable. Patient with no overt bleeding. H&H stable. Continue Niferex.  #6 elevated troponin Questionable etiology. Likely secondary to volume overload. Hypotension improved. Patient now with borderline blood pressure. 2-D echo with a EF of 55-60% with increased right ventricular systolic pressure consistent with moderate pulmonary hypertension. Cardiology following.  #7 closed left hip fracture Secondary to mechanical fall secondary to syncope. Patient status post IM nail per orthopedics. Per orthopedics.  #8 well-controlled diabetes mellitus type 2 Hemoglobin A1c 6.2. CBGs have ranged from 111 - 126. Continue sliding scale insulin.   #9 hypertension Continue to hold antihypertensive medications. Blood pressure borderline. Patient on minimal dose of Flomax.  #10 hyperlipidemia Continue statin.   #11 BPH Continue Proscar, uroxatral. Patient unable to state of his urologist is. Flomax was discussed with urology on-call who felt he was okay to discontinue Flomax and that was likely causing patient's hypotension/orthostasis. Will DC Flomax. Outpatient follow-up.  #12 chronic kidney disease stage III/IV Stable. Monitor closely with diuresis.   DVT prophylaxis: eliquis. Code Status: Full Family Communication: Updated patient. No family at bedside. Disposition Plan: Remain on telemetry. Likely skilled nursing facility versus CIR once cardiac issues have improved and per  orthopedics and cardiology.   Consultants:   Orthopedics: Dr. Margarita Rana 06/21/2017  PCCM Dr. Jamison Neighbor 06/21/2017   Cardiology: Dr. Jacinto Halim 06/23/2017   Electrophysiology: Dr. Sharrell Ku 06/26/2017  Procedures:   CT head CT /C-spine 06/20/2017  Chest x-ray 06/20/2017  IM nail intertrochanteric LLE --per Dr. Margarita Rana 06/21/2017  Antimicrobials:   None   Subjective: Patient laying in bed states shortness of breath has improved. No chest pain. Patient unable to state who his urologist's.   Objective: Vitals:   06/26/17 0522 06/26/17 1716 06/26/17 2137 06/27/17 0538  BP: (!) 103/58 (!) 104/53 (!) 102/57 (!) 101/49  Pulse: 62 (!) 59 63 (!) 59  Resp: 20 16 18 18   Temp: (!) 97.5 F (36.4 C) 97.6 F (36.4 C) 98 F (36.7 C) 97.7 F (36.5 C)  TempSrc: Oral Oral Oral Oral  SpO2: 98% 97% 97% 96%  Weight:      Height:        Intake/Output Summary (Last 24 hours) at 06/27/17 1319 Last data filed at 06/27/17 1213  Gross per 24 hour  Intake              560 ml  Output             2000 ml  Net            -1440 ml   Filed Weights   06/22/17 1138 06/23/17 1306 06/24/17 0445  Weight: 108.2 kg (238 lb 8 oz) 106.7 kg (235 lb 3.2 oz) 106.9 kg (235 lb 11.2 oz)    Examination:  General exam: In bed Respiratory system: Bibasilar crackles. No wheezing, no rhonchi. Cardiovascular system: Irregularly irregular. Bradycardia. +  JVD, no murmurs, rubs, gallops or clicks. 1-2+ bilateral  lower extremity edema. Gastrointestinal system: Abdomen is soft, nontender, nondistended, positive bowel sounds.  Central nervous system: Alert and oriented. No focal neurological deficits.  Extremities: Symmetric 5 x 5 power. Skin: No rashes, lesions or ulcers Psychiatry: Judgement and insight appear fair. Mood & affect appropriate.     Data Reviewed: I have personally reviewed following labs and imaging studies  CBC:  Recent Labs Lab 06/20/17 2054  06/21/17 1252 06/21/17 2353  06/23/17 0712 06/24/17 0750 06/25/17 0625  WBC 5.5  --  9.0 6.1 6.8 5.1  --   NEUTROABS 4.3  --  8.1*  --   --   --   --   HGB 9.9*  < > 10.9* 9.9* 9.7* 9.1* 8.7*  HCT 31.1*  < > 34.9* 31.4* 30.5* 28.6* 27.3*  MCV 91.7  --  92.3 91.8 90.0 89.9  --   PLT 121*  --  144* 129* 142* 138*  --   < > = values in this interval not displayed. Basic Metabolic Panel:  Recent Labs Lab 06/21/17 2353 06/23/17 0712 06/24/17 0750 06/25/17 0625 06/26/17 0704 06/26/17 1440 06/27/17 0311  NA 141 141 140 139 139 140 140  K 4.0 4.0 3.8 3.6 3.1* 3.6 3.6  CL 106 107 102 101 102 101 101  CO2 23 24 29 29 26 29 29   GLUCOSE 202* 81 101* 133* 124* 102* 127*  BUN 57* 72* 69* 74* 74* 75* 73*  CREATININE 2.24* 2.15* 2.06* 2.23* 2.03* 1.95* 1.83*  CALCIUM 8.3* 8.6* 8.5* 8.6* 8.8* 8.7* 8.8*  MG 1.9 2.1 2.0  --   --   --   --   PHOS 4.5 3.9  --   --   --   --   --    GFR: Estimated Creatinine Clearance: 31.8 mL/min (A) (by C-G formula based on SCr of 1.83 mg/dL (H)). Liver Function Tests:  Recent Labs Lab 06/21/17 0404  AST 26  ALT 16*  ALKPHOS 75  BILITOT 1.7*  PROT 6.1*  ALBUMIN 3.0*   No results for input(s): LIPASE, AMYLASE in the last 168 hours. No results for input(s): AMMONIA in the last 168 hours. Coagulation Profile: No results for input(s): INR, PROTIME in the last 168 hours. Cardiac Enzymes:  Recent Labs Lab 06/21/17 1821 06/21/17 2353  TROPONINI 0.11* 0.11*   BNP (last 3 results) No results for input(s): PROBNP in the last 8760 hours. HbA1C:  Recent Labs  06/25/17 0625  HGBA1C 6.2*   CBG:  Recent Labs Lab 06/26/17 1235 06/26/17 1643 06/26/17 2134 06/27/17 0801 06/27/17 1212  GLUCAP 118* 97 132* 111* 161*   Lipid Profile: No results for input(s): CHOL, HDL, LDLCALC, TRIG, CHOLHDL, LDLDIRECT in the last 72 hours. Thyroid Function Tests: No results for input(s): TSH, T4TOTAL, FREET4, T3FREE, THYROIDAB in the last 72 hours. Anemia Panel: No results for  input(s): VITAMINB12, FOLATE, FERRITIN, TIBC, IRON, RETICCTPCT in the last 72 hours. Sepsis Labs:  Recent Labs Lab 06/20/17 2058  LATICACIDVEN 2.11*    Recent Results (from the past 240 hour(s))  MRSA PCR Screening     Status: None   Collection Time: 06/21/17  5:47 AM  Result Value Ref Range Status   MRSA by PCR NEGATIVE NEGATIVE Final    Comment:        The GeneXpert MRSA Assay (FDA approved for NASAL specimens only), is one component of a comprehensive MRSA colonization surveillance program. It is not intended to diagnose MRSA infection nor to guide or monitor treatment for  MRSA infections.          Radiology Studies: No results found.      Scheduled Meds: . allopurinol  100 mg Oral Daily  . apixaban  2.5 mg Oral BID  . atorvastatin  40 mg Oral q1800  . brinzolamide  1 drop Both Eyes TID  . docusate sodium  100 mg Oral BID  . finasteride  5 mg Oral Daily  . insulin aspart  0-15 Units Subcutaneous TID WC  . iron polysaccharides  150 mg Oral Daily  . potassium chloride  40 mEq Oral Daily  . sildenafil  20 mg Oral TID  . tamsulosin  0.4 mg Oral Daily  . torsemide  20 mg Oral BID  . Travoprost (BAK Free)  1 drop Both Eyes QHS   Continuous Infusions: . lactated ringers 10 mL/hr at 06/22/17 1546  . sodium chloride       LOS: 6 days    Time spent: 35 mins    Malcolm Quast, MD [facility] Pager [phone]  If 7PM-7AM, please contact night-coverage [email or web address] Password [email or web address] 06/27/2017, 1:19 PM

## 2017-06-27 NOTE — H&P (View-Only) (Signed)
Dr. Patwardhan called asked to implant loop.  I discussed with the patient and is daughter at bedside, rational for the implant, alternative with holter/event monitors, the implant procedure, risks and benefits and the patient and family would like to proceed.  We will plan for today.  Wound care was discussed and wound check visit will be arranged.  Renee Ursuy, PA-C  Risks and benefits of ILR were discussed with the patient who wishes to proceed.  Keyona Emrich MD, FACC 06/27/2017 3:33 PM   

## 2017-06-27 NOTE — Progress Notes (Signed)
Physical Therapy Treatment Patient Details Name: [name] [name] MRN: [id number] DOB: 09/05/1925 Today's Date: 06/27/2017    History of Present Illness Pt is an 83 y.o. male admitted through ED on 06/20/17 following a syncopal episode related to hypotension resulting in a fall and L hip fx. Pt underwent an IM nail on 06/21/17. Pt has continued to have issues with hypotension which has limited his ability to mobilize. PMH significant for CKD3, DM2, chronic diastolic HF, HLD, HTN.     PT Comments    Pt was seen for visit to progress gait, and was able to take a few more steps than previous visit.  He sat up in chair at end of tx and was able to demonstrate some control of stepping on RLE with RW.  Will continue on with ROM and strengthening as tolerated to LLE prior to dc to SNF.  Pt needs to focus on WB on LLE and standing balance control with BLE's.  Follow Up Recommendations  SNF     Equipment Recommendations  None recommended by PT    Recommendations for Other Services OT consult     Precautions / Restrictions Precautions Precautions: Fall Restrictions Weight Bearing Restrictions: No LLE Weight Bearing: Weight bearing as tolerated (Simultaneous filing. User may not have seen previous data.)    Mobility  Bed Mobility Overal bed mobility: Needs Assistance Bed Mobility: Supine to Sit;Sit to Supine     Supine to sit: Mod assist;+2 for physical assistance;HOB elevated     General bed mobility comments: trunk to sit up and assisted legs at the end with bed pad  Transfers Overall transfer level: Needs assistance Equipment used: Rolling walker (2 wheeled) Transfers: Sit to/from Stand Sit to Stand: +2 physical assistance;Mod assist Stand pivot transfers: Mod assist;+2 physical assistance;+2 safety/equipment;From elevated surface       General transfer comment: reminders for hand placement  Ambulation/Gait Ambulation/Gait assistance: Mod assist;+2 physical  assistance Ambulation Distance (Feet): 4 Feet Assistive device: Rolling walker (2 wheeled) Gait Pattern/deviations: Decreased stance time - left;Step-to pattern;Shuffle;Decreased stride length;Wide base of support;Trunk flexed Gait velocity: reduced Gait velocity interpretation: Below normal speed for age/gender General Gait Details: could minimally stand on his LLE with max assist needed to step through on RLE   Stairs            Wheelchair Mobility    Modified Rankin (Stroke Patients Only)       Balance Overall balance assessment: Needs assistance   Sitting balance-Leahy Scale: Fair     Standing balance support: Bilateral upper extremity supported;During functional activity Standing balance-Leahy Scale: Poor Standing balance comment: difficult to get hands to walker and let go of bed                            Cognition Arousal/Alertness: Awake/alert Behavior During Therapy: WFL for tasks assessed/performed Overall Cognitive Status: Within Functional Limits for tasks assessed                                        Exercises      General Comments        Pertinent Vitals/Pain Pain Assessment: 0-10 Pain Score: 7  Pain Location: LLE with mobility Pain Descriptors / Indicators: Grimacing;Guarding;Sore Pain Intervention(s): Limited activity within patient's tolerance;Monitored during session;Premedicated before session;Repositioned (declined ice)    Home Living  Prior Function            PT Goals (current goals can now be found in the care plan section) Acute Rehab PT Goals Patient Stated Goal: to walk more, hurt less Progress towards PT goals: Progressing toward goals    Frequency    Min 3X/week      PT Plan Current plan remains appropriate    Co-evaluation PT/OT/SLP Co-Evaluation/Treatment: Yes Reason for Co-Treatment: For patient/therapist safety;To address functional/ADL  transfers PT goals addressed during session: Mobility/safety with mobility;Proper use of DME OT goals addressed during session: ADL's and self-care      AM-PAC PT "6 Clicks" Daily Activity  Outcome Measure  Difficulty turning over in bed (including adjusting bedclothes, sheets and blankets)?: Unable Difficulty moving from lying on back to sitting on the side of the bed? : Unable Difficulty sitting down on and standing up from a chair with arms (e.g., wheelchair, bedside commode, etc,.)?: Unable Help needed moving to and from a bed to chair (including a wheelchair)?: A Lot Help needed walking in hospital room?: A Lot Help needed climbing 3-5 steps with a railing? : Total 6 Click Score: 8    End of Session Equipment Utilized During Treatment: Gait belt Activity Tolerance: Patient limited by fatigue;Patient limited by pain Patient left: in chair;with call bell/phone within reach;with chair alarm set Nurse Communication: Mobility status PT Visit Diagnosis: Other abnormalities of gait and mobility (R26.89);Muscle weakness (generalized) (M62.81);History of falling (Z91.81);Pain Pain - Right/Left: Left Pain - part of body: Knee;Hip;Leg     Time: 1410-1434 PT Time Calculation (min) (ACUTE ONLY): 24 min  Charges:  $Gait Training: 8-22 mins                    G Codes:  Functional Assessment Tool Used: AM-PAC 6 Clicks Basic Mobility     Ivar Drape 06/27/2017, 5:33 PM   Samul Dada, PT MS Acute Rehab Dept. Number: [facility] [id number] and [facility] [phone]

## 2017-06-27 NOTE — Progress Notes (Signed)
Follow up for syncope and acute on chronic sytstolic/chronic diastolic heart failure  Subjective:  Stable breathing Denies chest pain  Slept well    Telemetry: Atrial fibrillation with ventricular rate in 60s-70s  Nurse Report: No acute events overnight   Seen by EP yesterday. Appreciate their input. Event monitor recommended  Objective:  Vital Signs in the last 24 hours: Temp:  [97.6 F (36.4 C)-98 F (36.7 C)] 97.7 F (36.5 C) (08/28 0538) Pulse Rate:  [59-63] 59 (08/28 0538) Resp:  [16-18] 18 (08/28 0538) BP: (101-104)/(49-57) 101/49 (08/28 0538) SpO2:  [96 %-97 %] 96 % (08/28 0538)  Intake/Output from previous day: 08/27 0701 - 08/28 0700 In: -  Out: 2175 [Urine:2175] -1.9 L urine output over last 24 hrs, another 600 cc this morning Intake not well documented  Physical Exam: Vitals:   06/26/17 2137 06/27/17 0538  BP: (!) 102/57 (!) 101/49  Pulse: 63 (!) 59  Resp: 18 18  Temp: 98 F (36.7 C) 97.7 F (36.5 C)  SpO2: 97% 96%   General appearance: alert, cooperative, appears stated age, no distress Neck: no adenopathy, no carotid bruit, supple, symmetrical, trachea midline, thyroid not enlarged, symmetric, no tenderness/mass/nodules and JVD elevated to the angle of the jaw Lungs: Bilateral good air entry. Minimal rales. Heart: irregularly irregular rhythm, bradycardic, S1, S2 normal and systolic murmur: systolic ejection 3/6, crescendo at 2nd right intercostal space Abdomen: Distended, ascites present, nontender, bowel sounds heard in all 4 quadrants. Male genitalia: Persistent scrotal edema. Foley catheter in place. Extremities: edema 2+. Dressings intact left hip. No bleeding, hematoma Pulses: 2+ and symmetric Neurologic: Grossly normal  Lab Results:  Recent Labs  06/25/17 0625  HGB 8.7*    Recent Labs  06/26/17 1440 06/27/17 0311  NA 140 140  K 3.6 3.6  CL 101 101  CO2 29 29  GLUCOSE 102* 127*  BUN 75* 73*  CREATININE 1.95* 1.83*   No  results for input(s): TROPONINI in the last 72 hours.  Invalid input(s): CK, MB  Imaging: No new imaging overnight  Cardiac Studies: CARDIAC STUDIES:  EKG 06/21/2017: Atrial fibrillation with controlled ventricular response at a rate of 66 bpm, poor R-wave progression, low-voltage complexes, nonspecific T abnormality. Borderline prolonged QT interval.  ECHO: 06/22/2017: Left ventricle: The cavity size was mildly reduced. There was moderate concentric hypertrophy. The estimated ejection fraction was in the range of 55% to 60%. The study is not technically sufficient to allow evaluation of LV diastolic function. - Aortic valve: Mildly calcified annulus. Trileaflet; moderately calcified leaflets. There was mild stenosis. Valve area (VTI): 1.5 cm. Valve area (Vmax): 1.29 cm. Valve area (Vmean): 1.26 cm- Mitral valve: Moderately calcified annulus. Valve area bycontinuity equation (using LVOT flow): 1.7 cm^2. - Left atrium: The atrium was moderately dilated. - Right ventricle: The cavity size was mildly dilated. Wall thickness was normal. - Right atrium: The atrium was moderately dilated. - Tricuspid valve: There was moderate regurgitation.Pulmonary arteries: PA peak pressure: 43 mm Hg (S).   Assessment/Plan:   83 year old patient Caucasian male admitted with syncope and fall leading to left femur intertrochanteric fracture s/p nail procedure 06/21/2017 Acute on chronic diastolic heart failure, persistent Afib with slow ventricular response, CAD, hypertension, DM, CKD 2, hyperlipidemia  Intertochanteric fracture: Orthopedics following. Dressing intact  Fall/syncope: While syncope could well be partially due to orthostatic hypotension, bradycardic in 40s while in Afib without AV nodal blocking agents cocerning for chronotropic incompetence. EP consulted re: question about PPM placement. PPM may correct for bradycardia  but likely will not correct for hypotension if the  inciting event was truly a vasovagal cardioneurogenic syncope. Another offending agent causing orthostatic hypotension could be tamsulosin. Consider urology input regarding reducing the dose.  Seen by EP. I personally spoke with Dr. Ladona Ridgel regarding my concern about another such event in this previously independent 83 y/o man up until this syncopal fall. I appreciate his input. He will discuss with Dr. Johney Frame about a loop recorder. Continue to hold beta blockers. Agree with discussing tamsulosin with urology, possible cause for orthostatic hypotension. Will schedule outpatient follow up in [facility]. Expect short term rehab stay  Acute on chronic systolic/diastolic heart failure: Diuresing very well on torsemide 20 mg bid.  -2.1 urine output/24 hr. -1.2 L net negative for hospital stay Cr improved to 1.83 Electrolyte replacement as necessary Salt restriction diet Continue sildenafil given RV failure and pulmonary hypertension. His weight and intake/output are discrepant. Clinically improving volume status   Afib with slow ventricular response: No asystole, complete AV block on telemetry.  Continue apixaban . CHADVASC score 5, high risk, 6.7%  CAD: Known chronic RCA occlusion. No ongoing symptoms  Type 2 DM with complications CKD IV: Creatinine improving. On appropriate management  I have discussed the current plan and potential for pacemaker placement with the patient and his daughter [name] [name] ([phone]). I have discussed the DNR status with the patient and he is willing to rescind it for peri-procedural period should he require pacemaker placement.     LOS: 6 days    Keshan Reha J Devone Tousley 06/27/2017, 9:27 AM  Preslei Blakley Emiliano Dyer, MD [facility] Pager: [phone] Office: [phone] If no answer Cell [phone]

## 2017-06-27 NOTE — Progress Notes (Signed)
Patient back from cath lab with loop recorder insertion (left chest). Alert and oriented, no acute distress noted. Will continue to monitor.

## 2017-06-27 NOTE — Interval H&P Note (Signed)
History and Physical Interval Note:  06/27/2017 3:33 PM  [name] [name]  has presented today for surgery, with the diagnosis of snycope  The various methods of treatment have been discussed with the patient and family. After consideration of risks, benefits and other options for treatment, the patient has consented to  Procedure(s): LOOP RECORDER INSERTION (N/A) as a surgical intervention .  The patient's history has been reviewed, patient examined, no change in status, stable for surgery.  I have reviewed the patient's chart and labs.  Questions were answered to the patient's satisfaction.     Hillis Range

## 2017-06-27 NOTE — Progress Notes (Signed)
Pt in cath lab when I came by. Will follow up in the AM.   Ranelle Oyster, MD, [facility] 06/27/2017

## 2017-06-27 NOTE — Discharge Instructions (Addendum)
Implant wound care Keep incision clean and dry for 3 days. (no showers for 3 days) You can remove outer dressing tomorrow. Leave steri-strips (little pieces of tape) on until seen in the office for wound check appointment. Call the office [phone]) for redness, drainage, swelling, or fever.   Information on my medicine - ELIQUIS (apixaban)  This medication education was reviewed with me or my healthcare representative as part of my discharge preparation.  The pharmacist that spoke with me during my hospital stay was:  Talbert Forest, RPH  Why was Eliquis prescribed for you? Eliquis was prescribed for you to reduce the risk of forming blood clots that can cause a stroke if you have a medical condition called atrial fibrillation (a type of irregular heartbeat) OR to reduce the risk of a blood clots forming after orthopedic surgery.  What do You need to know about Eliquis ? Take your Eliquis TWICE DAILY - one tablet in the morning and one tablet in the evening with or without food.  It would be best to take the doses about the same time each day.  If you have difficulty swallowing the tablet whole please discuss with your pharmacist how to take the medication safely.  Take Eliquis exactly as prescribed by your doctor and DO NOT stop taking Eliquis without talking to the doctor who prescribed the medication.  Stopping may increase your risk of developing a new clot or stroke.  Refill your prescription before you run out.  After discharge, you should have regular check-up appointments with your healthcare provider that is prescribing your Eliquis.  In the future your dose may need to be changed if your kidney function or weight changes by a significant amount or as you get older.  What do you do if you miss a dose? If you miss a dose, take it as soon as you remember on the same day and resume taking twice daily.  Do not take more than one dose of ELIQUIS at the same time.  Important  Safety Information A possible side effect of Eliquis is bleeding. You should call your healthcare provider right away if you experience any of the following: ? Bleeding from an injury or your nose that does not stop. ? Unusual colored urine (red or dark brown) or unusual colored stools (red or black). ? Unusual bruising for unknown reasons. ? A serious fall or if you hit your head (even if there is no bleeding).  Some medicines may interact with Eliquis and might increase your risk of bleeding or clotting while on Eliquis. To help avoid this, consult your healthcare provider or pharmacist prior to using any new prescription or non-prescription medications, including herbals, vitamins, non-steroidal anti-inflammatory drugs (NSAIDs) and supplements.  This website has more information on Eliquis (apixaban): [email or web address].cy.

## 2017-06-28 ENCOUNTER — Encounter (HOSPITAL_COMMUNITY): Payer: Self-pay | Admitting: Internal Medicine

## 2017-06-28 DIAGNOSIS — I5030 Unspecified diastolic (congestive) heart failure: Secondary | ICD-10-CM | POA: Diagnosis not present

## 2017-06-28 DIAGNOSIS — Z809 Family history of malignant neoplasm, unspecified: Secondary | ICD-10-CM | POA: Diagnosis not present

## 2017-06-28 DIAGNOSIS — Z23 Encounter for immunization: Secondary | ICD-10-CM | POA: Diagnosis not present

## 2017-06-28 DIAGNOSIS — E785 Hyperlipidemia, unspecified: Secondary | ICD-10-CM | POA: Diagnosis present

## 2017-06-28 DIAGNOSIS — R001 Bradycardia, unspecified: Secondary | ICD-10-CM | POA: Diagnosis not present

## 2017-06-28 DIAGNOSIS — K219 Gastro-esophageal reflux disease without esophagitis: Secondary | ICD-10-CM | POA: Diagnosis not present

## 2017-06-28 DIAGNOSIS — N400 Enlarged prostate without lower urinary tract symptoms: Secondary | ICD-10-CM | POA: Diagnosis present

## 2017-06-28 DIAGNOSIS — R410 Disorientation, unspecified: Secondary | ICD-10-CM | POA: Diagnosis not present

## 2017-06-28 DIAGNOSIS — E569 Vitamin deficiency, unspecified: Secondary | ICD-10-CM | POA: Diagnosis not present

## 2017-06-28 DIAGNOSIS — S72002A Fracture of unspecified part of neck of left femur, initial encounter for closed fracture: Secondary | ICD-10-CM | POA: Diagnosis not present

## 2017-06-28 DIAGNOSIS — I5032 Chronic diastolic (congestive) heart failure: Secondary | ICD-10-CM | POA: Diagnosis not present

## 2017-06-28 DIAGNOSIS — E1122 Type 2 diabetes mellitus with diabetic chronic kidney disease: Secondary | ICD-10-CM | POA: Diagnosis not present

## 2017-06-28 DIAGNOSIS — H42 Glaucoma in diseases classified elsewhere: Secondary | ICD-10-CM | POA: Diagnosis not present

## 2017-06-28 DIAGNOSIS — Z95818 Presence of other cardiac implants and grafts: Secondary | ICD-10-CM | POA: Diagnosis not present

## 2017-06-28 DIAGNOSIS — E876 Hypokalemia: Secondary | ICD-10-CM | POA: Diagnosis not present

## 2017-06-28 DIAGNOSIS — Z111 Encounter for screening for respiratory tuberculosis: Secondary | ICD-10-CM | POA: Diagnosis not present

## 2017-06-28 DIAGNOSIS — R55 Syncope and collapse: Secondary | ICD-10-CM | POA: Diagnosis not present

## 2017-06-28 DIAGNOSIS — I130 Hypertensive heart and chronic kidney disease with heart failure and stage 1 through stage 4 chronic kidney disease, or unspecified chronic kidney disease: Secondary | ICD-10-CM | POA: Diagnosis present

## 2017-06-28 DIAGNOSIS — J80 Acute respiratory distress syndrome: Secondary | ICD-10-CM | POA: Diagnosis not present

## 2017-06-28 DIAGNOSIS — I482 Chronic atrial fibrillation: Secondary | ICD-10-CM | POA: Diagnosis not present

## 2017-06-28 DIAGNOSIS — K5900 Constipation, unspecified: Secondary | ICD-10-CM | POA: Diagnosis not present

## 2017-06-28 DIAGNOSIS — I4891 Unspecified atrial fibrillation: Secondary | ICD-10-CM | POA: Diagnosis not present

## 2017-06-28 DIAGNOSIS — G8911 Acute pain due to trauma: Secondary | ICD-10-CM | POA: Diagnosis not present

## 2017-06-28 DIAGNOSIS — M1990 Unspecified osteoarthritis, unspecified site: Secondary | ICD-10-CM | POA: Diagnosis present

## 2017-06-28 DIAGNOSIS — E860 Dehydration: Secondary | ICD-10-CM | POA: Diagnosis not present

## 2017-06-28 DIAGNOSIS — R42 Dizziness and giddiness: Secondary | ICD-10-CM | POA: Diagnosis not present

## 2017-06-28 DIAGNOSIS — S72009A Fracture of unspecified part of neck of unspecified femur, initial encounter for closed fracture: Secondary | ICD-10-CM | POA: Diagnosis not present

## 2017-06-28 DIAGNOSIS — N183 Chronic kidney disease, stage 3 (moderate): Secondary | ICD-10-CM | POA: Diagnosis not present

## 2017-06-28 DIAGNOSIS — E1121 Type 2 diabetes mellitus with diabetic nephropathy: Secondary | ICD-10-CM | POA: Diagnosis not present

## 2017-06-28 DIAGNOSIS — E1129 Type 2 diabetes mellitus with other diabetic kidney complication: Secondary | ICD-10-CM | POA: Diagnosis not present

## 2017-06-28 DIAGNOSIS — N390 Urinary tract infection, site not specified: Secondary | ICD-10-CM | POA: Diagnosis not present

## 2017-06-28 DIAGNOSIS — R300 Dysuria: Secondary | ICD-10-CM | POA: Diagnosis not present

## 2017-06-28 DIAGNOSIS — I350 Nonrheumatic aortic (valve) stenosis: Secondary | ICD-10-CM | POA: Diagnosis present

## 2017-06-28 DIAGNOSIS — M6281 Muscle weakness (generalized): Secondary | ICD-10-CM | POA: Diagnosis not present

## 2017-06-28 DIAGNOSIS — N401 Enlarged prostate with lower urinary tract symptoms: Secondary | ICD-10-CM | POA: Diagnosis not present

## 2017-06-28 DIAGNOSIS — S72142D Displaced intertrochanteric fracture of left femur, subsequent encounter for closed fracture with routine healing: Secondary | ICD-10-CM | POA: Diagnosis not present

## 2017-06-28 DIAGNOSIS — R278 Other lack of coordination: Secondary | ICD-10-CM | POA: Diagnosis not present

## 2017-06-28 DIAGNOSIS — I959 Hypotension, unspecified: Secondary | ICD-10-CM | POA: Diagnosis not present

## 2017-06-28 DIAGNOSIS — Z7982 Long term (current) use of aspirin: Secondary | ICD-10-CM | POA: Diagnosis not present

## 2017-06-28 DIAGNOSIS — R64 Cachexia: Secondary | ICD-10-CM | POA: Diagnosis present

## 2017-06-28 DIAGNOSIS — S72002S Fracture of unspecified part of neck of left femur, sequela: Secondary | ICD-10-CM | POA: Diagnosis not present

## 2017-06-28 DIAGNOSIS — I481 Persistent atrial fibrillation: Secondary | ICD-10-CM | POA: Diagnosis present

## 2017-06-28 DIAGNOSIS — Z823 Family history of stroke: Secondary | ICD-10-CM | POA: Diagnosis not present

## 2017-06-28 DIAGNOSIS — M109 Gout, unspecified: Secondary | ICD-10-CM | POA: Diagnosis not present

## 2017-06-28 DIAGNOSIS — I495 Sick sinus syndrome: Secondary | ICD-10-CM | POA: Diagnosis not present

## 2017-06-28 DIAGNOSIS — R748 Abnormal levels of other serum enzymes: Secondary | ICD-10-CM | POA: Diagnosis not present

## 2017-06-28 DIAGNOSIS — I10 Essential (primary) hypertension: Secondary | ICD-10-CM | POA: Diagnosis not present

## 2017-06-28 DIAGNOSIS — N184 Chronic kidney disease, stage 4 (severe): Secondary | ICD-10-CM | POA: Diagnosis not present

## 2017-06-28 DIAGNOSIS — N3289 Other specified disorders of bladder: Secondary | ICD-10-CM | POA: Diagnosis not present

## 2017-06-28 DIAGNOSIS — I272 Pulmonary hypertension, unspecified: Secondary | ICD-10-CM | POA: Diagnosis not present

## 2017-06-28 DIAGNOSIS — Z7984 Long term (current) use of oral hypoglycemic drugs: Secondary | ICD-10-CM | POA: Diagnosis not present

## 2017-06-28 DIAGNOSIS — I5033 Acute on chronic diastolic (congestive) heart failure: Secondary | ICD-10-CM | POA: Diagnosis present

## 2017-06-28 DIAGNOSIS — Z87891 Personal history of nicotine dependence: Secondary | ICD-10-CM | POA: Diagnosis not present

## 2017-06-28 DIAGNOSIS — I209 Angina pectoris, unspecified: Secondary | ICD-10-CM | POA: Diagnosis not present

## 2017-06-28 DIAGNOSIS — Z7901 Long term (current) use of anticoagulants: Secondary | ICD-10-CM | POA: Diagnosis not present

## 2017-06-28 DIAGNOSIS — I499 Cardiac arrhythmia, unspecified: Secondary | ICD-10-CM | POA: Diagnosis not present

## 2017-06-28 DIAGNOSIS — D509 Iron deficiency anemia, unspecified: Secondary | ICD-10-CM | POA: Diagnosis not present

## 2017-06-28 DIAGNOSIS — D649 Anemia, unspecified: Secondary | ICD-10-CM | POA: Diagnosis not present

## 2017-06-28 DIAGNOSIS — S7292XS Unspecified fracture of left femur, sequela: Secondary | ICD-10-CM | POA: Diagnosis not present

## 2017-06-28 DIAGNOSIS — S72142A Displaced intertrochanteric fracture of left femur, initial encounter for closed fracture: Secondary | ICD-10-CM | POA: Diagnosis not present

## 2017-06-28 DIAGNOSIS — R262 Difficulty in walking, not elsewhere classified: Secondary | ICD-10-CM | POA: Diagnosis not present

## 2017-06-28 DIAGNOSIS — Z6823 Body mass index (BMI) 23.0-23.9, adult: Secondary | ICD-10-CM | POA: Diagnosis not present

## 2017-06-28 DIAGNOSIS — Z96659 Presence of unspecified artificial knee joint: Secondary | ICD-10-CM | POA: Diagnosis present

## 2017-06-28 DIAGNOSIS — S7292XD Unspecified fracture of left femur, subsequent encounter for closed fracture with routine healing: Secondary | ICD-10-CM | POA: Diagnosis not present

## 2017-06-28 DIAGNOSIS — E119 Type 2 diabetes mellitus without complications: Secondary | ICD-10-CM | POA: Diagnosis not present

## 2017-06-28 DIAGNOSIS — Z86010 Personal history of colonic polyps: Secondary | ICD-10-CM | POA: Diagnosis not present

## 2017-06-28 LAB — CBC
HCT: 29.5 % — ABNORMAL LOW (ref 39.0–52.0)
Hemoglobin: 9.4 g/dL — ABNORMAL LOW (ref 13.0–17.0)
MCH: 28.8 pg (ref 26.0–34.0)
MCHC: 31.9 g/dL (ref 30.0–36.0)
MCV: 90.5 fL (ref 78.0–100.0)
PLATELETS: 156 10*3/uL (ref 150–400)
RBC: 3.26 MIL/uL — ABNORMAL LOW (ref 4.22–5.81)
RDW: 16.7 % — AB (ref 11.5–15.5)
WBC: 5.3 10*3/uL (ref 4.0–10.5)

## 2017-06-28 LAB — BASIC METABOLIC PANEL
Anion gap: 10 (ref 5–15)
BUN: 74 mg/dL — AB (ref 6–20)
CO2: 29 mmol/L (ref 22–32)
Calcium: 8.8 mg/dL — ABNORMAL LOW (ref 8.9–10.3)
Chloride: 102 mmol/L (ref 101–111)
Creatinine, Ser: 1.83 mg/dL — ABNORMAL HIGH (ref 0.61–1.24)
GFR calc Af Amer: 36 mL/min — ABNORMAL LOW (ref >60)
GFR, EST NON AFRICAN AMERICAN: 31 mL/min — AB (ref >60)
GLUCOSE: 117 mg/dL — AB (ref 65–99)
POTASSIUM: 3.2 mmol/L — AB (ref 3.5–5.1)
Sodium: 141 mmol/L (ref 135–145)

## 2017-06-28 LAB — GLUCOSE, CAPILLARY
Glucose-Capillary: 115 mg/dL — ABNORMAL HIGH (ref 65–99)
Glucose-Capillary: 202 mg/dL — ABNORMAL HIGH (ref 65–99)

## 2017-06-28 LAB — MAGNESIUM: Magnesium: 1.7 mg/dL (ref 1.7–2.4)

## 2017-06-28 MED ORDER — METOLAZONE 5 MG PO TABS
5.0000 mg | ORAL_TABLET | Freq: Once | ORAL | Status: AC
  Administered 2017-06-28: 5 mg via ORAL
  Filled 2017-06-28: qty 1

## 2017-06-28 MED ORDER — POTASSIUM CHLORIDE CRYS ER 20 MEQ PO TBCR
20.0000 meq | EXTENDED_RELEASE_TABLET | Freq: Once | ORAL | Status: AC
  Administered 2017-06-28: 20 meq via ORAL

## 2017-06-28 MED ORDER — POLYSACCHARIDE IRON COMPLEX 150 MG PO CAPS: 150.0000 mg | ORAL_CAPSULE | Freq: Every day | ORAL | Status: DC

## 2017-06-28 MED ORDER — DOCUSATE SODIUM 100 MG PO CAPS: 100.0000 mg | ORAL_CAPSULE | Freq: Two times a day (BID) | ORAL | 0 refills | Status: DC

## 2017-06-28 NOTE — Progress Notes (Signed)
Unable to weighthe patient to complete malnutrition screening due to malfunction with the bed.  Will notify day shift staff and continue to monitor patient

## 2017-06-28 NOTE — Consult Note (Signed)
   [facility] CM Inpatient Consult   06/28/2017  ANDREA COLGLAZIER 20-Aug-1925 [id number]  Late entry 1400:  Chart reviewed for post hospital needs in the Medicare ACO. Admitted with fracture of the left hip, S/P Intramedullary Nail of the Left Leg had a loop recorder placed on 06/27/17.  Chart review reveals the patient is scheduled to go to rehab at [facility].  Met with the patient at the bedside, was barely able to complete sentences when asked questions. Patient did endorse Dr. Domenick Gong as his primary care provider.  Patient denies needs when returning home from rehab.  States his granddaughter is handing his caregiver needs. He uses [facility] [address] and a mail delivery pharmacy, he said.  Nurse entered and assessed his shortness of breath. No needs for [name] [facility] noted at this time. For questions, please contact:  Natividad Brood, RN BSN [facility] Liaison  [phone] business mobile phone Toll free office [phone]

## 2017-06-28 NOTE — Clinical Social Work Placement (Signed)
   CLINICAL SOCIAL WORK PLACEMENT  NOTE  Date:  06/28/2017  Patient Details  Name: [name] [name] E Krenzer MRN: [id number] Date of Birth: September 11, 1925  Clinical Social Work is seeking post-discharge placement for this patient at the Skilled  Nursing Facility level of care (*CSW will initial, date and re-position this form in  chart as items are completed):  Yes   Patient/family provided with [place] Clinical Social Work Department's list of facilities offering this level of care within the geographic area requested by the patient (or if unable, by the patient's family).  Yes   Patient/family informed of their freedom to choose among providers that offer the needed level of care, that participate in Medicare, Medicaid or managed care program needed by the patient, have an available bed and are willing to accept the patient.  Yes   Patient/family informed of [facility]'s ownership interest in [facility] and [facility], as well as of the fact that they are under no obligation to receive care at these facilities.  PASRR submitted to EDS on 06/22/17     PASRR number received on 06/22/17     Existing PASRR number confirmed on       FL2 transmitted to all facilities in geographic area requested by pt/family on 06/22/17     FL2 transmitted to all facilities within larger geographic area on       Patient informed that his/her managed care company has contracts with or will negotiate with certain facilities, including the following:        Yes   Patient/family informed of bed offers received.  [facility]     Physician recommends and patient chooses bed at      [facility].  Patient to be transferred to facility by [facility]     Patient family notified on 06/28/17 of transfer.  Name of family member notified:  Daughter     PHYSICIAN       Additional Comment:     _______________________________________________ Mearl LatinNadia S Sal Spratley, LCSWA 06/28/2017, 11:19 AM

## 2017-06-28 NOTE — Progress Notes (Signed)
Family requests [facility] and pt to be discharged today. [phone]

## 2017-06-28 NOTE — Progress Notes (Addendum)
Follow up for syncope and acute on chronic sytstolic/chronic diastolic heart failure  Subjective:  Stable breathing Denies chest pain  Slept well    Telemetry: Atrial fibrillation with ventricular rate in 60s-70s  Loop recorder placed yesterday by Dr. Johney Frame. Appreciate his help.  Nurse Report: No acute events overnight   Objective:  Vital Signs in the last 24 hours: Temp:  [97.5 F (36.4 C)-97.6 F (36.4 C)] 97.5 F (36.4 C) (08/29 0639) Pulse Rate:  [55-71] 55 (08/29 0639) Resp:  [18-20] 18 (08/29 0639) BP: (105-108)/(49-58) 108/58 (08/29 0639) SpO2:  [96 %-100 %] 96 % (08/29 1018)  Intake/Output from previous day: 08/28 0701 - 08/29 0700 In: 1040 [P.O.:1040] Out: 1675 [Urine:1675] -1.9 L urine output over last 24 hrs, another 600 cc this morning Intake not well documented  Physical Exam: Vitals:   06/28/17 0639 06/28/17 1018  BP: (!) 108/58   Pulse: (!) 55   Resp: 18   Temp: (!) 97.5 F (36.4 C)   SpO2: 98% 96%   General appearance: alert, cooperative, appears stated age, no distress Neck: no adenopathy, no carotid bruit, supple, symmetrical, trachea midline, thyroid not enlarged, symmetric, no tenderness/mass/nodules and JVD elevated to the angle of the jaw Lungs: Bilateral good air entry. No rales. Heart: irregularly irregular rhythm, bradycardic, S1, S2 normal and systolic murmur: systolic ejection 3/6, crescendo at 2nd right intercostal space Abdomen: Distended, ascites present, nontender, bowel sounds heard in all 4 quadrants. Male genitalia: Improving scrotal edema. Foley catheter in place. Extremities: edema 2+. Dressings intact left hip. No bleeding, hematoma Pulses: 2+ and symmetric Neurologic: Grossly normal  Lab Results:  Recent Labs  06/28/17 0451  WBC 5.3  HGB 9.4*  PLT 156    Recent Labs  06/27/17 0311 06/28/17 0451  NA 140 141  K 3.6 3.2*  CL 101 102  CO2 29 29  GLUCOSE 127* 117*  BUN 73* 74*  CREATININE 1.83* 1.83*   No  results for input(s): TROPONINI in the last 72 hours.  Invalid input(s): CK, MB  Imaging: No new imaging overnight  Cardiac Studies: CARDIAC STUDIES:  EKG 06/21/2017: Atrial fibrillation with controlled ventricular response at a rate of 66 bpm, poor R-wave progression, low-voltage complexes, nonspecific T abnormality. Borderline prolonged QT interval.  ECHO: 06/22/2017: Left ventricle: The cavity size was mildly reduced. There was moderate concentric hypertrophy. The estimated ejection fraction was in the range of 55% to 60%. The study is not technically sufficient to allow evaluation of LV diastolic function. - Aortic valve: Mildly calcified annulus. Trileaflet; moderately calcified leaflets. There was mild stenosis. Valve area (VTI): 1.5 cm. Valve area (Vmax): 1.29 cm. Valve area (Vmean): 1.26 cm- Mitral valve: Moderately calcified annulus. Valve area bycontinuity equation (using LVOT flow): 1.7 cm^2. - Left atrium: The atrium was moderately dilated. - Right ventricle: The cavity size was mildly dilated. Wall thickness was normal. - Right atrium: The atrium was moderately dilated. - Tricuspid valve: There was moderate regurgitation.Pulmonary arteries: PA peak pressure: 43 mm Hg (S).   Assessment/Plan:   83 year old patient Caucasian male admitted with syncope and fall leading to left femur intertrochanteric fracture s/p nail procedure 06/21/2017 Acute on chronic diastolic heart failure, persistent Afib with slow ventricular response, CAD, hypertension, DM, CKD 2, hyperlipidemia  Intertochanteric fracture: Orthopedics following. Dressing intact  Fall/syncope: While syncope could well be partially due to orthostatic hypotension, bradycardic in 40s while in Afib without AV nodal blocking agents cocerning for chronotropic incompetence. EP consulted re: question about PPM placement. PPM may  correct for bradycardia but likely will not correct for hypotension if the  inciting event was truly a vasovagal cardioneurogenic syncope. Another offending agent causing orthostatic hypotension could be tamsulosin. Consider urology input regarding reducing the dose.  Appreciate EP help with loop recorder  Acute on chronic systolic/diastolic heart failure: Diuresing very well on torsemide 20 mg bid.  -2.6 L net Cr improved to 1.83 Electrolyte replacement as necessary Salt restriction diet Continue sildenafil given RV failure and pulmonary hypertension. His weight and intake/output are discrepant. Clinically improving volume status Would recommend 2.5 mg zaroxalyn every other day on discharge F/u w/me on 07/05/2017 at 12 noon  Afib with slow ventricular response: No asystole, complete AV block on telemetry.  Continue apixaban . CHADVASC score 5, high risk, 6.7%  CAD: Known chronic RCA occlusion. No ongoing symptoms  Type 2 DM with complications CKD IV: Creatinine improving. On appropriate management     LOS: 7 days    Antoria Lanza J Kynslei Art 06/28/2017, 2:45 PM  Modupe Shampine Emiliano Dyer, MD [facility] [place] Pager: [phone] Office: [phone] If no answer Cell [phone]

## 2017-06-28 NOTE — Progress Notes (Signed)
Patient will DC to: [facility] Anticipated DC date: 06/28/17 Family notified: Daughter Transport by: [facility] 2:30pm   Per MD [facility]. RN, patient, patient's family, and facility notified of DC. Discharge Summary sent to facility. RN given number for report [phone]). DC packet on chart. Ambulance transport requested for patient.   CSW signing off.  Cristobal GoldmannNadia Santrice Muzio, [place] Clinical Social Worker [phone]

## 2017-06-28 NOTE — Progress Notes (Signed)
[facility] to be D/C'd Skilled nursing facility per MD order.  Discussed with the patient and all questions fully answered.  VSS, Skin: abrasion on left elbow from fall and surgical incision on left hip with allevyn pads in place. IV catheter discontinued intact. Site without signs and symptoms of complications. Dressing and pressure applied.  An After Visit Summary was printed and given to the patient. Patient received prescription.  Allergies as of 06/28/2017   No Known Allergies     Medication List    STOP taking these medications   isosorbide mononitrate 60 MG 24 hr tablet Commonly known as:  IMDUR   losartan 25 MG tablet Commonly known as:  COZAAR   metolazone 5 MG tablet Commonly known as:  ZAROXOLYN   metoprolol succinate 25 MG 24 hr tablet Commonly known as:  TOPROL-XL     TAKE these medications   allopurinol 100 MG tablet Commonly known as:  ZYLOPRIM Take 100 mg by mouth daily.   brinzolamide 1 % ophthalmic suspension Commonly known as:  AZOPT Place 1 drop into both eyes at bedtime.   docusate sodium 100 MG capsule Commonly known as:  COLACE Take 1 capsule (100 mg total) by mouth 2 (two) times daily.   ELIQUIS 5 MG Tabs tablet Generic drug:  apixaban Take 2.5 mg by mouth 2 (two) times daily.   finasteride 5 MG tablet Commonly known as:  PROSCAR Take 5 mg by mouth daily.   glimepiride 4 MG tablet Commonly known as:  AMARYL Take 0.5 tablets (2 mg total) by mouth daily.   HYDROcodone-acetaminophen 5-325 MG tablet Commonly known as:  NORCO Take 1 tablet by mouth every 6 (six) hours as needed for moderate pain.   iron polysaccharides 150 MG capsule Commonly known as:  NIFEREX Take 1 capsule (150 mg total) by mouth daily.   MYRBETRIQ 50 MG Tb24 tablet Generic drug:  mirabegron ER Take 50 mg by mouth daily.   nitroGLYCERIN 0.4 MG SL tablet Commonly known as:  NITROSTAT Place 1 tablet (0.4 mg total) under the tongue every 5 (five) minutes as  needed for chest pain.   potassium chloride 10 MEQ tablet Commonly known as:  K-DUR Take 10 mEq by mouth daily.   RAPAFLO 8 MG Caps capsule Generic drug:  silodosin Take 8 mg by mouth daily.   simvastatin 80 MG tablet Commonly known as:  ZOCOR Take 40 mg by mouth daily at 6 PM.   torsemide 20 MG tablet Commonly known as:  DEMADEX Take 1 tablet (20 mg total) by mouth 2 (two) times daily. What changed:  how much to take   travoprost (benzalkonium) 0.004 % ophthalmic solution Commonly known as:  TRAVATAN Place 1 drop into both eyes at bedtime.            Discharge Care Instructions        Start     Ordered   06/28/17 0000  docusate sodium (COLACE) 100 MG capsule  2 times daily     06/28/17 0955   06/28/17 0000  iron polysaccharides (NIFEREX) 150 MG capsule  Daily     06/28/17 0955   06/28/17 0000  Increase activity slowly     06/28/17 0955   06/21/17 0000  HYDROcodone-acetaminophen (NORCO) 5-325 MG tablet  Every 6 hours PRN     06/21/17 0903      D/c education completed with patient/family including follow up instructions, medication list, d/c activities limitations if indicated, with other d/c instructions as indicated  by MD - patient able to verbalize understanding, all questions fully answered.   Patient instructed to return to ED, call 911, or call MD for any changes in condition.   Contacted [facility] for report but no answer after several attempts.  Patient escorted via stretcher, and D/C to [facility] via [place].  Orene Desanctis 06/28/2017 2:31 PM

## 2017-06-28 NOTE — Discharge Summary (Signed)
Physician Discharge Summary   Patient ID: [name] [name] MRN: [id number] DOB/AGE: 81-Nov-1926 81 y.o.  Admit date: 06/20/2017 Discharge date: 06/28/2017  Primary Care Physician:  Gaspar Garbe, MD  Discharge Diagnoses:   . Closed left hip fracture, initial encounter (HCC) . Chronic kidney disease, stage III (moderate) . DM (diabetes mellitus), type 2 with renal complications (HCC) . acute on chronic diastolic CHF . Hypotension . Hypertension . Hyperlipidemia . GERD . Atrial fibrillation (HCC) . Syncope . Iron deficiency anemia . Elevated troponin   Consults:   Orthopedics Cardiology  Recommendations for Outpatient Follow-up:  1. Please repeat CBC/BMET at next visit 2. Patient needs to have follow-up appointment with his cardiologist in 7-10 days 3. Losartan, Zaroxolyn, beta blocker, Imdur currently on hold due to borderline BP, will need to be started gradually   DIET: carb modified    Allergies:  No Known Allergies   DISCHARGE MEDICATIONS: Current Discharge Medication List    START taking these medications   Details  docusate sodium (COLACE) 100 MG capsule Take 1 capsule (100 mg total) by mouth 2 (two) times daily. Qty: 10 capsule, Refills: 0    HYDROcodone-acetaminophen (NORCO) 5-325 MG tablet Take 1 tablet by mouth every 6 (six) hours as needed for moderate pain. Qty: 30 tablet, Refills: 0    iron polysaccharides (NIFEREX) 150 MG capsule Take 1 capsule (150 mg total) by mouth daily.      CONTINUE these medications which have NOT CHANGED   Details  allopurinol (ZYLOPRIM) 100 MG tablet Take 100 mg by mouth daily.    brinzolamide (AZOPT) 1 % ophthalmic suspension Place 1 drop into both eyes at bedtime.     ELIQUIS 5 MG TABS tablet Take 2.5 mg by mouth 2 (two) times daily.     finasteride (PROSCAR) 5 MG tablet Take 5 mg by mouth daily.    glimepiride (AMARYL) 4 MG tablet Take 0.5 tablets (2 mg total) by mouth daily.    MYRBETRIQ 50 MG TB24  tablet Take 50 mg by mouth daily.    nitroGLYCERIN (NITROSTAT) 0.4 MG SL tablet Place 1 tablet (0.4 mg total) under the tongue every 5 (five) minutes as needed for chest pain. Qty: 25 tablet, Refills: 6    potassium chloride (K-DUR) 10 MEQ tablet Take 10 mEq by mouth daily.    RAPAFLO 8 MG CAPS capsule Take 8 mg by mouth daily.    simvastatin (ZOCOR) 80 MG tablet Take 40 mg by mouth daily at 6 PM.     torsemide (DEMADEX) 20 MG tablet Take 1 tablet (20 mg total) by mouth 2 (two) times daily. Qty: 30 tablet, Refills: 0    travoprost, benzalkonium, (TRAVATAN) 0.004 % ophthalmic solution Place 1 drop into both eyes at bedtime.      STOP taking these medications     isosorbide mononitrate (IMDUR) 60 MG 24 hr tablet      losartan (COZAAR) 25 MG tablet      metolazone (ZAROXOLYN) 5 MG tablet      metoprolol succinate (TOPROL-XL) 25 MG 24 hr tablet          Brief H and P: For complete details please refer to admission H and P, but in brief Patient is a 83 year old gentleman history of chronic kidney disease stage III, diabetes type 2, CHF, BPH presenting to the ED after a syncopal episode with a fall and left hip pain. Patient with inability to bear weight. Patient noted to have a left hip fracture and underwent  IM nail to the left hip per orthopedics. Postoperatively patient noted to be persistently hypotensive on pressors and critical care consulted. Patient was placed in the ICU unit. Patient subsequently weaned off pressors and transferred to the floor. Blood pressure borderline. Patient hypotension felt to be secondary to volume depletion. Patient now in acute on chronic diastolic heart failure/right-sided heart failure. Patient being followed by cardiology and placed on diuretics.  Hospital Course:     Hypotension - likely due to hypovolemia, no infectious etiology. - 2-D echo with normal EF, cortisol within normal limits - Patient was on anticoagulation prior to admission and  hence PE unlikely. - Patient was initially placed on pressors and in critical care unit, pressors have been weaned off - Continue to hold multiple antihypertensives including Imdur, losartan, beta blocker. Patient has been started back on diuretics, due to acute CHF exacerbation - Continue Demadex, received one dose of Zaroxolyn prior to discharge.     GERD - continue PPI  Acute on chronic diastolic CHF/right-sided heart failure  - Improving, scan due to right heart failure in the setting of aggressive fluid resuscitation due to hypotension - 2-D echo consistent with right heart failure with moderate pulmonary hypertension, EF 55-60% - Followed by cardiology and restarted on diuretics, continue Demadex, given one dose of metolazone prior to discharge - continue rapaflo, daily weights, strict I's and O's. - needs to follow-up with cardiology next 7-10 days for follow-up, will need to restart his medications includingmetolazone  Syncope - Patient had a syncopal episode prior to admission leading to fall and left hip fracture.  - Syncopal episode secondary to volume depletion, cardiac in nature as patient was noted to be bradycardic in the 40s, hence on no AV nodal blocking agents - Patient was also noted to be significantly hypotensive postoperatively after the left hip fracture repair and had to be placed on pressors in ICU. He he also received IV fluid hydration, had oral antihypertensives - patient diuretics have been resumed. No further syncopal episodes. - Cardiology consulted EP for further evaluation, decided to place a loop recorder on 8/28.   Atrial fibrillation - Rate controlled,  - [place] vasc 5, continue eliquis   Iron deficiency anemia - Anemia panel consistent with iron deficiency, hemoglobin stable, continue iron supplement  Elevated troponin - Likely due to hypotension, subsequently volume overload - 2-D echo with EF of 55-60% with increased right ventricular systolic  pressure consistent with moderate pulmonary hypertension  Close left hip fracture - scheduled to mechanical fall and syncope. Status post IM nail per orthopedics - Currently stable, continue eliquis  Diabetes mellitus - Currently stable,  A1c 6.2  BPH - Continue finasteride, outpatient follow-up recommended with urology    Chronic kidney disease stage 3/4 - Currently stable, monitor closely for diuresis  Hyperlipidemia Continue statin  Day of Discharge BP (!) 108/58 (BP Location: Right Arm)   Pulse (!) 55   Temp (!) 97.5 F (36.4 C) (Oral)   Resp 18   Ht 5\' 8"  (1.727 m)   Wt 106.9 kg (235 lb 11.2 oz)   SpO2 96%   BMI 35.84 kg/m   Physical Exam: General: Alert and awake oriented x3 not in any acute distress. HEENT: anicteric sclera, pupils reactive to light and accommodation CVS: S1-S2 clear no murmur rubs or gallops Chest: clear to auscultation bilaterally, no wheezing rales or rhonchi Abdomen: soft nontender, nondistended, normal bowel sounds Extremities: no cyanosis, clubbing, 1+ edema noted bilaterally    The results of significant  diagnostics from this hospitalization (including imaging, microbiology, ancillary and laboratory) are listed below for reference.    LAB RESULTS: Basic Metabolic Panel:  Recent Labs Lab 06/23/17 0712  06/27/17 0311 06/28/17 0451  NA 141  < > 140 141  K 4.0  < > 3.6 3.2*  CL 107  < > 101 102  CO2 24  < > 29 29  GLUCOSE 81  < > 127* 117*  BUN 72*  < > 73* 74*  CREATININE 2.15*  < > 1.83* 1.83*  CALCIUM 8.6*  < > 8.8* 8.8*  MG 2.1  < >  --  1.7  PHOS 3.9  --   --   --   < > = values in this interval not displayed. Liver Function Tests: No results for input(s): AST, ALT, ALKPHOS, BILITOT, PROT, ALBUMIN in the last 168 hours. No results for input(s): LIPASE, AMYLASE in the last 168 hours. No results for input(s): AMMONIA in the last 168 hours. CBC:  Recent Labs Lab 06/21/17 1252  06/24/17 0750 06/25/17 0625  06/28/17 0451  WBC 9.0  < > 5.1  --  5.3  NEUTROABS 8.1*  --   --   --   --   HGB 10.9*  < > 9.1* 8.7* 9.4*  HCT 34.9*  < > 28.6* 27.3* 29.5*  MCV 92.3  < > 89.9  --  90.5  PLT 144*  < > 138*  --  156  < > = values in this interval not displayed. Cardiac Enzymes:  Recent Labs Lab 06/21/17 1821 06/21/17 2353  TROPONINI 0.11* 0.11*   BNP: Invalid input(s): POCBNP CBG:  Recent Labs Lab 06/27/17 1706 06/28/17 0815  GLUCAP 126* 115*    Significant Diagnostic Studies:  Ct Head Wo Contrast  Result Date: 06/20/2017 CLINICAL DATA:  Syncope. EXAM: CT HEAD WITHOUT CONTRAST CT CERVICAL SPINE WITHOUT CONTRAST TECHNIQUE: Multidetector CT imaging of the head and cervical spine was performed following the standard protocol without intravenous contrast. Multiplanar CT image reconstructions of the cervical spine were also generated. COMPARISON:  None. FINDINGS: CT HEAD FINDINGS Brain: Mild diffuse cortical atrophy is noted. No mass effect or midline shift is noted. Ventricular size is within normal limits. There is no evidence of mass lesion, hemorrhage or acute infarction. Vascular: Atherosclerosis of carotid siphons is noted. Skull: Normal. Negative for fracture or focal lesion. Sinuses/Orbits: No acute finding. Other: None. CT CERVICAL SPINE FINDINGS Alignment: Normal. Skull base and vertebrae: No acute fracture. No primary bone lesion or focal pathologic process. Soft tissues and spinal canal: No prevertebral fluid or swelling. No visible canal hematoma. Disc levels: Anterior osteophyte formation is noted at C4-5, C5-6 and C6-7. Upper chest: Negative. Other: Degenerative changes seen involving posterior facet joints. IMPRESSION: Mild diffuse cortical atrophy. No acute intracranial abnormality seen. Mild degenerative changes as described above. No acute abnormality seen in the cervical spine. Electronically Signed   By: Lupita Raider, M.D.   On: 06/20/2017 22:50   Ct Cervical Spine Wo  Contrast  Result Date: 06/20/2017 CLINICAL DATA:  Syncope. EXAM: CT HEAD WITHOUT CONTRAST CT CERVICAL SPINE WITHOUT CONTRAST TECHNIQUE: Multidetector CT imaging of the head and cervical spine was performed following the standard protocol without intravenous contrast. Multiplanar CT image reconstructions of the cervical spine were also generated. COMPARISON:  None. FINDINGS: CT HEAD FINDINGS Brain: Mild diffuse cortical atrophy is noted. No mass effect or midline shift is noted. Ventricular size is within normal limits. There is no evidence of mass lesion,  hemorrhage or acute infarction. Vascular: Atherosclerosis of carotid siphons is noted. Skull: Normal. Negative for fracture or focal lesion. Sinuses/Orbits: No acute finding. Other: None. CT CERVICAL SPINE FINDINGS Alignment: Normal. Skull base and vertebrae: No acute fracture. No primary bone lesion or focal pathologic process. Soft tissues and spinal canal: No prevertebral fluid or swelling. No visible canal hematoma. Disc levels: Anterior osteophyte formation is noted at C4-5, C5-6 and C6-7. Upper chest: Negative. Other: Degenerative changes seen involving posterior facet joints. IMPRESSION: Mild diffuse cortical atrophy. No acute intracranial abnormality seen. Mild degenerative changes as described above. No acute abnormality seen in the cervical spine. Electronically Signed   By: Lupita Raider, M.D.   On: 06/20/2017 22:50   Dg Chest Port 1 View  Result Date: 06/21/2017 CLINICAL DATA:  Hypotension EXAM: PORTABLE CHEST 1 VIEW COMPARISON:  06/20/2017 FINDINGS: Cardiomegaly. Low lung volumes with bibasilar atelectasis. Mild vascular congestion. No effusions. Old healed left rib fractures. IMPRESSION: Low lung volumes with bibasilar atelectasis. Mild vascular congestion. Electronically Signed   By: Charlett Nose M.D.   On: 06/21/2017 10:54   Dg Chest Port 1 View  Result Date: 06/20/2017 CLINICAL DATA:  Preop for hip fracture. EXAM: PORTABLE CHEST 1 VIEW  COMPARISON:  Radiographs of December 10, 2015. FINDINGS: Stable cardiomediastinal silhouette with mild central pulmonary vascular congestion. Atherosclerosis of thoracic aorta is noted. No pneumothorax or significant pleural effusion is noted. No pulmonary abnormality is noted. Old left rib fractures are noted as well as deformity of left scapula suggesting old fracture. IMPRESSION: Aortic atherosclerosis. Stable central pulmonary vascular congestion. No acute abnormality seen. Electronically Signed   By: Lupita Raider, M.D.   On: 06/20/2017 22:04   Dg C-arm 1-60 Min  Result Date: 06/21/2017 CLINICAL DATA:  Internal fixation EXAM: LEFT FEMUR PORTABLE 2 VIEWS; DG C-ARM 61-120 MIN COMPARISON:  06/20/2017 FINDINGS: Internal fixation within the medullary nail and hip screw across the intertrochanteric fracture. No hardware complicating feature. Changes of prior left knee replacement. IMPRESSION: Internal fixation across the left intertrochanteric fracture with near anatomic alignment and no complicating feature. Electronically Signed   By: Charlett Nose M.D.   On: 06/21/2017 08:47   Dg Hip Port Unilat With Pelvis 1v Left  Result Date: 06/21/2017 CLINICAL DATA:  Postop left intertrochanteric fracture EXAM: DG HIP (WITH OR WITHOUT PELVIS) 1V PORT LEFT COMPARISON:  None. FINDINGS: Internal fixation changes across the left femoral intertrochanteric fracture. There remains mild displacement of the greater and lesser trochanters. No subluxation or dislocation. No hardware complicating feature. IMPRESSION: Internal fixation across the left femoral intertrochanteric fracture with mild continued displacement as above. Electronically Signed   By: Charlett Nose M.D.   On: 06/21/2017 10:53   Dg Hip Unilat With Pelvis 2-3 Views Left  Result Date: 06/20/2017 CLINICAL DATA:  Pain after fall. EXAM: DG HIP (WITH OR WITHOUT PELVIS) 2-3V LEFT COMPARISON:  None. FINDINGS: There is an acute, closed, comminuted, varus angulated  intertrochanteric fracture of the left femur with fractures undermining the greater trochanter and with slight avulsion of the lesser trochanter. The femoral head is seated within its acetabular component. The bony pelvis appears intact. There is mild lumbar degenerative disc disease of the included lumbar spine. Fine bony detail is limited by patient body habitus. Right femur and hip appear intact. No disruption of the sacroiliac joints and pubic symphysis. The arcuate lines of the sacrum appear intact. IMPRESSION: 1. There is an acute, closed, comminuted, varus angulated intertrochanteric fracture of the left femur with  fractures undermining the greater trochanter and with slight avulsion of the lesser trochanter. 2. Mild lumbar spondylosis. 3. Intact bony pelvis without diastasis. Electronically Signed   By: Tollie Eth M.D.   On: 06/20/2017 22:06   Dg Femur Port Min 2 Views Left  Result Date: 06/21/2017 CLINICAL DATA:  Internal fixation EXAM: LEFT FEMUR PORTABLE 2 VIEWS; DG C-ARM 61-120 MIN COMPARISON:  06/20/2017 FINDINGS: Internal fixation within the medullary nail and hip screw across the intertrochanteric fracture. No hardware complicating feature. Changes of prior left knee replacement. IMPRESSION: Internal fixation across the left intertrochanteric fracture with near anatomic alignment and no complicating feature. Electronically Signed   By: Charlett Nose M.D.   On: 06/21/2017 08:47    2D ECHO: Study Conclusions  - Left ventricle: The cavity size was mildly reduced. There was   moderate concentric hypertrophy. The estimated ejection fraction   was in the range of 55% to 60%. The study is not technically   sufficient to allow evaluation of LV diastolic function. - Aortic valve: Mildly calcified annulus. Trileaflet; moderately   calcified leaflets. There was mild stenosis. Valve area (VTI):   1.5 cm^2. Valve area (Vmax): 1.29 cm^2. Valve area (Vmean): 1.26   cm^2. - Mitral valve: Moderately  calcified annulus. Valve area by   continuity equation (using LVOT flow): 1.7 cm^2. - Left atrium: The atrium was moderately dilated. - Right ventricle: The cavity size was mildly dilated. Wall   thickness was normal. - Right atrium: The atrium was moderately dilated. - Tricuspid valve: There was moderate regurgitation. - Pulmonary arteries: PA peak pressure: 43 mm Hg (S).  Impressions:  - The right ventricular systolic pressure was increased consistent   with moderate pulmonary hypertension.  Disposition and Follow-up: Discharge Instructions    Increase activity slowly    Complete by:  As directed        DISPOSITION:  SNF   DISCHARGE FOLLOW-UP Follow-up Information    Sheral Apley, MD Follow up in 2 week(s).   Specialty:  Orthopedic Surgery Contact information: [address] [place] [address] [phone]        [facility] Follow up on 07/12/2017.   Specialty:  Cardiology Why:  9:00AM, wound check visit Contact information: [address] [place] [address] [phone]       Tisovec, Adelfa Koh, MD. Schedule an appointment as soon as possible for a visit in 2 week(s).   Specialty:  Internal Medicine Contact information: [address] [place] [address] [phone]            Time spent on Discharge:   Signed:   Thad Ranger M.D. [facility] 06/28/2017, 11:39 AM Pager: [phone]

## 2017-06-29 DIAGNOSIS — N184 Chronic kidney disease, stage 4 (severe): Secondary | ICD-10-CM | POA: Diagnosis not present

## 2017-06-29 DIAGNOSIS — I5030 Unspecified diastolic (congestive) heart failure: Secondary | ICD-10-CM | POA: Diagnosis not present

## 2017-06-29 DIAGNOSIS — D649 Anemia, unspecified: Secondary | ICD-10-CM | POA: Diagnosis not present

## 2017-06-29 DIAGNOSIS — S72009A Fracture of unspecified part of neck of unspecified femur, initial encounter for closed fracture: Secondary | ICD-10-CM | POA: Diagnosis not present

## 2017-06-29 DIAGNOSIS — E1121 Type 2 diabetes mellitus with diabetic nephropathy: Secondary | ICD-10-CM | POA: Diagnosis not present

## 2017-07-05 DIAGNOSIS — I482 Chronic atrial fibrillation: Secondary | ICD-10-CM | POA: Diagnosis not present

## 2017-07-05 DIAGNOSIS — Z95818 Presence of other cardiac implants and grafts: Secondary | ICD-10-CM | POA: Diagnosis not present

## 2017-07-05 DIAGNOSIS — R55 Syncope and collapse: Secondary | ICD-10-CM | POA: Diagnosis not present

## 2017-07-05 DIAGNOSIS — I5032 Chronic diastolic (congestive) heart failure: Secondary | ICD-10-CM | POA: Diagnosis not present

## 2017-07-06 ENCOUNTER — Telehealth: Payer: Self-pay | Admitting: Physician Assistant

## 2017-07-06 NOTE — Telephone Encounter (Signed)
New message    [facility] calling to cancel wound check appt for pt per the family. [place] asked that the nurse for the device clinic call and speak to the RN supervisor for them. Informed her that pt needs to keep this appt since we are the implanting physician. Please call

## 2017-07-06 NOTE — Telephone Encounter (Signed)
Confirmed 07/12/17 appt with Fredrik CoveAnn Donnagee Nursing supervisor [facility]. I clarified that this appt is to check the implant incision, check the device function and provide education about the home monitor. She verbalizes understanding and will keep the appt.

## 2017-07-07 DIAGNOSIS — S72142D Displaced intertrochanteric fracture of left femur, subsequent encounter for closed fracture with routine healing: Secondary | ICD-10-CM | POA: Diagnosis not present

## 2017-07-12 ENCOUNTER — Ambulatory Visit (INDEPENDENT_AMBULATORY_CARE_PROVIDER_SITE_OTHER): Payer: Medicare Other | Admitting: *Deleted

## 2017-07-12 DIAGNOSIS — R001 Bradycardia, unspecified: Secondary | ICD-10-CM

## 2017-07-12 LAB — CUP PACEART INCLINIC DEVICE CHECK
Date Time Interrogation Session: 20180912103338
MDC IDC PG IMPLANT DT: 20180828

## 2017-07-12 NOTE — Progress Notes (Signed)
Loop wound check appointment. Steri-strips previously removed by patient. Wound without redness or edema. Incision edges approximated, wound well healed. Normal device function. Battery status: good. R-waves 0.1254mV. No symptom, tachy, pause, or brady episodes. AF detection off at implant (hx chronic AF per notes), +Eliquis. Patient and daughter educated about Carelink monitor, symptom activator, and wound care. Patient and family will follow with our office for now, may transfer LINQ monitoring to primary cardiologist, Dr. Jacinto HalimGanji, at a later date. Monthly summary reports and ROV with JA on 10/04/17.

## 2017-07-21 DIAGNOSIS — I272 Pulmonary hypertension, unspecified: Secondary | ICD-10-CM | POA: Diagnosis not present

## 2017-07-21 DIAGNOSIS — I482 Chronic atrial fibrillation: Secondary | ICD-10-CM | POA: Diagnosis not present

## 2017-07-21 DIAGNOSIS — I5032 Chronic diastolic (congestive) heart failure: Secondary | ICD-10-CM | POA: Diagnosis not present

## 2017-07-21 DIAGNOSIS — Z95818 Presence of other cardiac implants and grafts: Secondary | ICD-10-CM | POA: Diagnosis not present

## 2017-07-24 ENCOUNTER — Inpatient Hospital Stay (HOSPITAL_COMMUNITY)
Admission: EM | Admit: 2017-07-24 | Discharge: 2017-07-26 | DRG: 640 | Disposition: A | Discharge status: Skilled Nursing Facility | Payer: Medicare Other | Attending: Internal Medicine | Admitting: Internal Medicine

## 2017-07-24 ENCOUNTER — Emergency Department (HOSPITAL_COMMUNITY): Payer: Medicare Other

## 2017-07-24 ENCOUNTER — Encounter (HOSPITAL_COMMUNITY): Payer: Self-pay

## 2017-07-24 DIAGNOSIS — Z7984 Long term (current) use of oral hypoglycemic drugs: Secondary | ICD-10-CM

## 2017-07-24 DIAGNOSIS — I482 Chronic atrial fibrillation: Secondary | ICD-10-CM

## 2017-07-24 DIAGNOSIS — I130 Hypertensive heart and chronic kidney disease with heart failure and stage 1 through stage 4 chronic kidney disease, or unspecified chronic kidney disease: Secondary | ICD-10-CM | POA: Diagnosis not present

## 2017-07-24 DIAGNOSIS — I5033 Acute on chronic diastolic (congestive) heart failure: Secondary | ICD-10-CM | POA: Diagnosis not present

## 2017-07-24 DIAGNOSIS — R64 Cachexia: Secondary | ICD-10-CM | POA: Diagnosis present

## 2017-07-24 DIAGNOSIS — M109 Gout, unspecified: Secondary | ICD-10-CM | POA: Diagnosis not present

## 2017-07-24 DIAGNOSIS — E860 Dehydration: Secondary | ICD-10-CM | POA: Diagnosis not present

## 2017-07-24 DIAGNOSIS — I959 Hypotension, unspecified: Secondary | ICD-10-CM | POA: Diagnosis present

## 2017-07-24 DIAGNOSIS — N183 Chronic kidney disease, stage 3 unspecified: Secondary | ICD-10-CM | POA: Diagnosis present

## 2017-07-24 DIAGNOSIS — R42 Dizziness and giddiness: Secondary | ICD-10-CM

## 2017-07-24 DIAGNOSIS — H42 Glaucoma in diseases classified elsewhere: Secondary | ICD-10-CM | POA: Diagnosis not present

## 2017-07-24 DIAGNOSIS — Z66 Do not resuscitate: Secondary | ICD-10-CM | POA: Diagnosis present

## 2017-07-24 DIAGNOSIS — I481 Persistent atrial fibrillation: Secondary | ICD-10-CM | POA: Diagnosis present

## 2017-07-24 DIAGNOSIS — K5900 Constipation, unspecified: Secondary | ICD-10-CM | POA: Diagnosis not present

## 2017-07-24 DIAGNOSIS — M1990 Unspecified osteoarthritis, unspecified site: Secondary | ICD-10-CM | POA: Diagnosis present

## 2017-07-24 DIAGNOSIS — E119 Type 2 diabetes mellitus without complications: Secondary | ICD-10-CM | POA: Diagnosis not present

## 2017-07-24 DIAGNOSIS — Z7982 Long term (current) use of aspirin: Secondary | ICD-10-CM

## 2017-07-24 DIAGNOSIS — Z823 Family history of stroke: Secondary | ICD-10-CM

## 2017-07-24 DIAGNOSIS — R300 Dysuria: Secondary | ICD-10-CM | POA: Diagnosis not present

## 2017-07-24 DIAGNOSIS — R7989 Other specified abnormal findings of blood chemistry: Secondary | ICD-10-CM

## 2017-07-24 DIAGNOSIS — E785 Hyperlipidemia, unspecified: Secondary | ICD-10-CM | POA: Diagnosis present

## 2017-07-24 DIAGNOSIS — I495 Sick sinus syndrome: Secondary | ICD-10-CM | POA: Diagnosis present

## 2017-07-24 DIAGNOSIS — E1122 Type 2 diabetes mellitus with diabetic chronic kidney disease: Secondary | ICD-10-CM | POA: Diagnosis present

## 2017-07-24 DIAGNOSIS — E569 Vitamin deficiency, unspecified: Secondary | ICD-10-CM | POA: Diagnosis not present

## 2017-07-24 DIAGNOSIS — I350 Nonrheumatic aortic (valve) stenosis: Secondary | ICD-10-CM | POA: Diagnosis present

## 2017-07-24 DIAGNOSIS — Z6823 Body mass index (BMI) 23.0-23.9, adult: Secondary | ICD-10-CM

## 2017-07-24 DIAGNOSIS — N400 Enlarged prostate without lower urinary tract symptoms: Secondary | ICD-10-CM | POA: Diagnosis present

## 2017-07-24 DIAGNOSIS — Z87891 Personal history of nicotine dependence: Secondary | ICD-10-CM

## 2017-07-24 DIAGNOSIS — E876 Hypokalemia: Secondary | ICD-10-CM | POA: Diagnosis not present

## 2017-07-24 DIAGNOSIS — Z86010 Personal history of colonic polyps: Secondary | ICD-10-CM

## 2017-07-24 DIAGNOSIS — N401 Enlarged prostate with lower urinary tract symptoms: Secondary | ICD-10-CM | POA: Diagnosis not present

## 2017-07-24 DIAGNOSIS — I10 Essential (primary) hypertension: Secondary | ICD-10-CM | POA: Diagnosis present

## 2017-07-24 DIAGNOSIS — Z7901 Long term (current) use of anticoagulants: Secondary | ICD-10-CM

## 2017-07-24 DIAGNOSIS — Z809 Family history of malignant neoplasm, unspecified: Secondary | ICD-10-CM

## 2017-07-24 DIAGNOSIS — R001 Bradycardia, unspecified: Secondary | ICD-10-CM | POA: Diagnosis present

## 2017-07-24 DIAGNOSIS — K219 Gastro-esophageal reflux disease without esophagitis: Secondary | ICD-10-CM | POA: Diagnosis present

## 2017-07-24 DIAGNOSIS — I4891 Unspecified atrial fibrillation: Secondary | ICD-10-CM | POA: Diagnosis present

## 2017-07-24 DIAGNOSIS — D631 Anemia in chronic kidney disease: Secondary | ICD-10-CM | POA: Diagnosis present

## 2017-07-24 DIAGNOSIS — D509 Iron deficiency anemia, unspecified: Secondary | ICD-10-CM | POA: Diagnosis present

## 2017-07-24 DIAGNOSIS — Z96659 Presence of unspecified artificial knee joint: Secondary | ICD-10-CM | POA: Diagnosis present

## 2017-07-24 DIAGNOSIS — N390 Urinary tract infection, site not specified: Secondary | ICD-10-CM | POA: Diagnosis not present

## 2017-07-24 DIAGNOSIS — R778 Other specified abnormalities of plasma proteins: Secondary | ICD-10-CM | POA: Diagnosis present

## 2017-07-24 DIAGNOSIS — R748 Abnormal levels of other serum enzymes: Secondary | ICD-10-CM

## 2017-07-24 DIAGNOSIS — E870 Hyperosmolality and hypernatremia: Secondary | ICD-10-CM | POA: Diagnosis present

## 2017-07-24 DIAGNOSIS — R410 Disorientation, unspecified: Secondary | ICD-10-CM | POA: Diagnosis not present

## 2017-07-24 DIAGNOSIS — E1129 Type 2 diabetes mellitus with other diabetic kidney complication: Secondary | ICD-10-CM | POA: Diagnosis present

## 2017-07-24 LAB — I-STAT TROPONIN, ED
TROPONIN I, POC: 0.16 ng/mL — AB (ref 0.00–0.08)
TROPONIN I, POC: 0.14 ng/mL — AB (ref 0.00–0.08)

## 2017-07-24 LAB — LIPASE, BLOOD: LIPASE: 65 U/L — AB (ref 11–51)

## 2017-07-24 LAB — CBC WITH DIFFERENTIAL/PLATELET
BASOS ABS: 0 10*3/uL (ref 0.0–0.1)
BASOS PCT: 0 %
Eosinophils Absolute: 0.2 10*3/uL (ref 0.0–0.7)
Eosinophils Relative: 3 %
HCT: 45.4 % (ref 39.0–52.0)
HEMOGLOBIN: 15.5 g/dL (ref 13.0–17.0)
LYMPHS PCT: 15 %
Lymphs Abs: 1.2 10*3/uL (ref 0.7–4.0)
MCH: 29.9 pg (ref 26.0–34.0)
MCHC: 34.1 g/dL (ref 30.0–36.0)
MCV: 87.6 fL (ref 78.0–100.0)
Monocytes Absolute: 0.4 10*3/uL (ref 0.1–1.0)
Monocytes Relative: 6 %
NEUTROS ABS: 5.6 10*3/uL (ref 1.7–7.7)
NEUTROS PCT: 76 %
PLATELETS: 224 10*3/uL (ref 150–400)
RBC: 5.18 MIL/uL (ref 4.22–5.81)
RDW: 15.6 % — ABNORMAL HIGH (ref 11.5–15.5)
WBC: 7.5 10*3/uL (ref 4.0–10.5)

## 2017-07-24 LAB — COMPREHENSIVE METABOLIC PANEL
ALBUMIN: 4.3 g/dL (ref 3.5–5.0)
ALK PHOS: 189 U/L — AB (ref 38–126)
ALT: 26 U/L (ref 17–63)
AST: 40 U/L (ref 15–41)
Anion gap: 17 — ABNORMAL HIGH (ref 5–15)
BUN: 102 mg/dL — ABNORMAL HIGH (ref 6–20)
CALCIUM: 10.1 mg/dL (ref 8.9–10.3)
CHLORIDE: 87 mmol/L — AB (ref 101–111)
CO2: 38 mmol/L — AB (ref 22–32)
CREATININE: 1.82 mg/dL — AB (ref 0.61–1.24)
GFR calc Af Amer: 36 mL/min — ABNORMAL LOW (ref >60)
GFR calc non Af Amer: 31 mL/min — ABNORMAL LOW (ref >60)
GLUCOSE: 222 mg/dL — AB (ref 65–99)
Potassium: 2.1 mmol/L — CL (ref 3.5–5.1)
SODIUM: 142 mmol/L (ref 135–145)
Total Bilirubin: 2.1 mg/dL — ABNORMAL HIGH (ref 0.3–1.2)
Total Protein: 8.3 g/dL — ABNORMAL HIGH (ref 6.5–8.1)

## 2017-07-24 LAB — PROTIME-INR
INR: 1.29
Prothrombin Time: 16 seconds — ABNORMAL HIGH (ref 11.4–15.2)

## 2017-07-24 LAB — URINALYSIS, ROUTINE W REFLEX MICROSCOPIC
Bilirubin Urine: NEGATIVE
GLUCOSE, UA: NEGATIVE mg/dL
HGB URINE DIPSTICK: NEGATIVE
Ketones, ur: NEGATIVE mg/dL
LEUKOCYTES UA: NEGATIVE
Nitrite: NEGATIVE
PROTEIN: NEGATIVE mg/dL
SPECIFIC GRAVITY, URINE: 1.01 (ref 1.005–1.030)
pH: 6 (ref 5.0–8.0)

## 2017-07-24 LAB — CBG MONITORING, ED: GLUCOSE-CAPILLARY: 235 mg/dL — AB (ref 65–99)

## 2017-07-24 LAB — I-STAT CG4 LACTIC ACID, ED: Lactic Acid, Venous: 1.52 mmol/L (ref 0.5–1.9)

## 2017-07-24 LAB — GLUCOSE, CAPILLARY: GLUCOSE-CAPILLARY: 165 mg/dL — AB (ref 65–99)

## 2017-07-24 LAB — BRAIN NATRIURETIC PEPTIDE: B NATRIURETIC PEPTIDE 5: 482.5 pg/mL — AB (ref 0.0–100.0)

## 2017-07-24 LAB — MAGNESIUM: Magnesium: 2.6 mg/dL — ABNORMAL HIGH (ref 1.7–2.4)

## 2017-07-24 MED ORDER — BRINZOLAMIDE 1 % OP SUSP
1.0000 [drp] | Freq: Every day | OPHTHALMIC | Status: DC
  Administered 2017-07-24 – 2017-07-25 (×2): 1 [drp] via OPHTHALMIC
  Filled 2017-07-24: qty 10

## 2017-07-24 MED ORDER — POTASSIUM CHLORIDE 10 MEQ/100ML IV SOLN
10.0000 meq | INTRAVENOUS | Status: AC
  Administered 2017-07-24 (×5): 10 meq via INTRAVENOUS
  Filled 2017-07-24 (×6): qty 100

## 2017-07-24 MED ORDER — POLYSACCHARIDE IRON COMPLEX 150 MG PO CAPS
150.0000 mg | ORAL_CAPSULE | Freq: Every day | ORAL | Status: DC
  Administered 2017-07-25 – 2017-07-26 (×2): 150 mg via ORAL
  Filled 2017-07-24 (×2): qty 1

## 2017-07-24 MED ORDER — HYDROCODONE-ACETAMINOPHEN 5-325 MG PO TABS
1.0000 | ORAL_TABLET | Freq: Four times a day (QID) | ORAL | Status: DC | PRN
  Administered 2017-07-25: 1 via ORAL
  Filled 2017-07-24: qty 1

## 2017-07-24 MED ORDER — LORAZEPAM 2 MG/ML IJ SOLN
0.5000 mg | Freq: Once | INTRAMUSCULAR | Status: AC
  Administered 2017-07-24: 0.5 mg via INTRAVENOUS
  Filled 2017-07-24: qty 1

## 2017-07-24 MED ORDER — TAMSULOSIN HCL 0.4 MG PO CAPS
0.4000 mg | ORAL_CAPSULE | Freq: Every day | ORAL | Status: DC
  Administered 2017-07-25: 0.4 mg via ORAL
  Filled 2017-07-24: qty 1

## 2017-07-24 MED ORDER — DOCUSATE SODIUM 100 MG PO CAPS
100.0000 mg | ORAL_CAPSULE | Freq: Two times a day (BID) | ORAL | Status: DC
  Administered 2017-07-24 – 2017-07-26 (×4): 100 mg via ORAL
  Filled 2017-07-24 (×4): qty 1

## 2017-07-24 MED ORDER — ONDANSETRON HCL 4 MG/2ML IJ SOLN: 4.0000 mg | Freq: Four times a day (QID) | INTRAMUSCULAR | Status: DC | PRN

## 2017-07-24 MED ORDER — ACETAMINOPHEN 325 MG PO TABS: 650.0000 mg | ORAL_TABLET | ORAL | Status: DC | PRN

## 2017-07-24 MED ORDER — TRAVOPROST 0.004 % OP SOLN: 1.0000 [drp] | Freq: Every day | OPHTHALMIC | Status: DC

## 2017-07-24 MED ORDER — APIXABAN 2.5 MG PO TABS
2.5000 mg | ORAL_TABLET | Freq: Two times a day (BID) | ORAL | Status: DC
  Administered 2017-07-24 – 2017-07-26 (×4): 2.5 mg via ORAL
  Filled 2017-07-24 (×4): qty 1

## 2017-07-24 MED ORDER — POTASSIUM CHLORIDE IN NACL 40-0.9 MEQ/L-% IV SOLN
INTRAVENOUS | Status: AC
  Administered 2017-07-24 – 2017-07-25 (×2): 75 mL/h via INTRAVENOUS
  Filled 2017-07-24 (×3): qty 1000

## 2017-07-24 MED ORDER — ONDANSETRON HCL 4 MG PO TABS: 4.0000 mg | ORAL_TABLET | Freq: Four times a day (QID) | ORAL | Status: DC | PRN

## 2017-07-24 MED ORDER — FINASTERIDE 5 MG PO TABS
5.0000 mg | ORAL_TABLET | Freq: Every day | ORAL | Status: DC
  Administered 2017-07-25 – 2017-07-26 (×2): 5 mg via ORAL
  Filled 2017-07-24 (×2): qty 1

## 2017-07-24 MED ORDER — INSULIN ASPART 100 UNIT/ML ~~LOC~~ SOLN
0.0000 [IU] | Freq: Three times a day (TID) | SUBCUTANEOUS | Status: DC
  Administered 2017-07-25: 2 [IU] via SUBCUTANEOUS
  Administered 2017-07-25 – 2017-07-26 (×3): 1 [IU] via SUBCUTANEOUS
  Administered 2017-07-26: 2 [IU] via SUBCUTANEOUS

## 2017-07-24 MED ORDER — LATANOPROST 0.005 % OP SOLN
1.0000 [drp] | Freq: Every day | OPHTHALMIC | Status: DC
  Administered 2017-07-24 – 2017-07-25 (×2): 1 [drp] via OPHTHALMIC
  Filled 2017-07-24 (×2): qty 2.5

## 2017-07-24 MED ORDER — SODIUM CHLORIDE 0.9 % IV BOLUS (SEPSIS)
500.0000 mL | Freq: Once | INTRAVENOUS | Status: AC
  Administered 2017-07-24: 500 mL via INTRAVENOUS

## 2017-07-24 MED ORDER — MIRABEGRON ER 25 MG PO TB24
50.0000 mg | ORAL_TABLET | Freq: Every day | ORAL | Status: DC
  Administered 2017-07-25 – 2017-07-26 (×2): 50 mg via ORAL
  Filled 2017-07-24 (×2): qty 2

## 2017-07-24 MED ORDER — SIMVASTATIN 40 MG PO TABS
40.0000 mg | ORAL_TABLET | Freq: Every day | ORAL | Status: DC
  Administered 2017-07-24 – 2017-07-26 (×3): 40 mg via ORAL
  Filled 2017-07-24 (×3): qty 1

## 2017-07-24 MED ORDER — ALLOPURINOL 100 MG PO TABS
100.0000 mg | ORAL_TABLET | Freq: Every day | ORAL | Status: DC
  Administered 2017-07-25 – 2017-07-26 (×2): 100 mg via ORAL
  Filled 2017-07-24 (×2): qty 1

## 2017-07-24 NOTE — ED Notes (Signed)
Report given to [facility] [id number].

## 2017-07-24 NOTE — ED Notes (Signed)
ED Provider at bedside. 

## 2017-07-24 NOTE — ED Notes (Signed)
Call report to [place] at [phone] at [phone].

## 2017-07-24 NOTE — ED Notes (Signed)
Loop recorder interrogated °

## 2017-07-24 NOTE — ED Notes (Signed)
Bed: WA16 Expected date:  Expected time:  Means of arrival:  Comments: EMS-weak 

## 2017-07-24 NOTE — ED Notes (Signed)
I stat given to MD and RN.

## 2017-07-24 NOTE — ED Triage Notes (Addendum)
Pt. Brought in via ems from [facility] for left hip fracture. Pt. Is hypotensive, disoriented from baseline, bradycardia 45-65, afib per [facility] facility. HR 58-65 and A/Ox4 per EMS. Pt. Is on a fluid restriction diet. Pt. Has lost 30 lbs since hip fracture and uses a walker.

## 2017-07-24 NOTE — Progress Notes (Signed)
Received patient from ED, VS and weight obtained, O2 in use, telemetry applied, call light placed in reach

## 2017-07-24 NOTE — ED Provider Notes (Signed)
[facility]-EMERGENCY DEPT Provider Note   CSN: [id number] Arrival date & time: 07/24/17  1223     History   Chief Complaint Chief Complaint  Patient presents with  . Hypotension  . Weakness    HPI [name] [name] is a 83 y.o. male.  The history is provided by the patient, a relative and medical records.  Dizziness  Quality:  Lightheadedness Severity:  Moderate Onset quality:  Gradual Duration:  1 day Timing:  Intermittent Progression:  Waxing and waning Chronicity:  New Context: not with loss of consciousness and not when urinating   Relieved by:  Nothing Worsened by:  Nothing Ineffective treatments:  None tried Associated symptoms: no chest pain, no diarrhea, no headaches, no nausea, no palpitations, no shortness of breath, no vomiting and no weakness     Past Medical History:  Diagnosis Date  . BPH with elevated PSA   . Chest pain   . Chronic kidney disease, stage III (moderate)   . DM (diabetes mellitus), type 2 with renal complications (HCC)   . ED (erectile dysfunction)   . Glaucoma   . Hyperlipidemia   . Hypertension   . Microalbuminuria   . Microalbuminuria 2013  . Obesity   . Osteoarthritis   . Prostatic hypertrophy, benign, with obstruction   . Renal calculus   . Sick sinus syndrome [facility])     Patient Active Problem List   Diagnosis Date Noted  . Closed intertrochanteric fracture of left femur (HCC)   . Bradycardia   . Closed left hip fracture, initial encounter (HCC) 06/21/2017  . Hypotension 06/21/2017  . Syncope 06/21/2017  . Iron deficiency anemia 06/21/2017  . Elevated troponin 06/21/2017  . Closed fracture of left hip (HCC)   . Acute on chronic diastolic heart failure (HCC) 12/01/2016  . Atrial fibrillation (HCC) 02/24/2016  . Bilateral leg weakness 09/17/2015  . Chest pain   . DM (diabetes mellitus), type 2 with renal complications (HCC)   . Hypertension   . Hyperlipidemia   . Prostatic hypertrophy, benign, with obstruction   . Sick  sinus syndrome (HCC)   . Obesity   . Microalbuminuria   . Glaucoma   . Renal calculus   . Osteoarthritis   . Chronic kidney disease, stage III (moderate)   . BPH with elevated PSA   . ED (erectile dysfunction)   . RECTAL BLEEDING 04/22/2008  . DYSPHAGIA 04/22/2008  . HYPERLIPIDEMIA 04/18/2008  . HTN (hypertension) 04/18/2008  . ESOPHAGEAL STRICTURE 04/18/2008  . GERD 04/18/2008  . HIATAL HERNIA 04/18/2008  . DIVERTICULOSIS, COLON 04/18/2008  . COLONIC POLYPS, HYPERPLASTIC, HX OF 04/18/2008    Past Surgical History:  Procedure Laterality Date  . INTRAMEDULLARY (IM) NAIL INTERTROCHANTERIC Left 06/21/2017   Procedure: INTRAMEDULLARY (IM) NAIL INTERTROCHANTRIC Left Leg;  Surgeon: Sheral Apley, MD;  Location: [facility] OR;  Service: Orthopedics;  Laterality: Left;  . INTRAVASCULAR PRESSURE WIRE/FFR STUDY N/A 04/18/2017   Procedure: Intravascular Pressure Wire/FFR Study;  Surgeon: Yates Decamp, MD;  Location: [facility] INVASIVE CV LAB;  Service: Cardiovascular;  Laterality: N/A;  Prox LAD  . KNEE SURGERY    . LOOP RECORDER INSERTION N/A 06/27/2017   Procedure: LOOP RECORDER INSERTION;  Surgeon: Hillis Range, MD;  Location: [facility] INVASIVE CV LAB;  Service: Cardiovascular;  Laterality: N/A;  . REPLACEMENT TOTAL KNEE  02/26/01       Home Medications    Prior to Admission medications   Medication Sig Start Date End Date Taking? Authorizing Provider  acetaminophen (TYLENOL) 325 MG  tablet Take 650 mg by mouth every 4 (four) hours as needed.    [provider]  allopurinol (ZYLOPRIM) 100 MG tablet Take 100 mg by mouth daily.    [provider]  brinzolamide (AZOPT) 1 % ophthalmic suspension Place 1 drop into both eyes at bedtime.     [provider]  ciprofloxacin (CIPRO) 250 MG tablet Take 250 mg by mouth 2 (two) times daily.    [provider]  docusate sodium (COLACE) 100 MG capsule Take 1 capsule (100 mg total) by mouth 2 (two) times daily. 06/28/17   Rai, Ripudeep  K, MD  ELIQUIS 5 MG TABS tablet Take 2.5 mg by mouth 2 (two) times daily.  02/22/17   [provider]  finasteride (PROSCAR) 5 MG tablet Take 5 mg by mouth daily.    [provider]  furosemide (LASIX) 20 MG tablet Take 20 mg by mouth daily.    [provider]  glimepiride (AMARYL) 4 MG tablet Take 0.5 tablets (2 mg total) by mouth daily. 03/20/17   Yates Decamp, MD  HYDROcodone-acetaminophen (NORCO) 5-325 MG tablet Take 1 tablet by mouth every 6 (six) hours as needed for moderate pain. 06/21/17   Albina Billet III, PA-C  iron polysaccharides (NIFEREX) 150 MG capsule Take 1 capsule (150 mg total) by mouth daily. 06/28/17   Rai, Delene Ruffini, MD  metolazone (ZAROXOLYN) 2.5 MG tablet Take 2.5 mg by mouth every other day.    [provider]  MYRBETRIQ 50 MG TB24 tablet Take 50 mg by mouth daily. 11/27/15   [provider]  nitroGLYCERIN (NITROSTAT) 0.4 MG SL tablet Place 1 tablet (0.4 mg total) under the tongue every 5 (five) minutes as needed for chest pain. 09/17/15   Nahser, Deloris Ping, MD  potassium chloride (K-DUR) 10 MEQ tablet Take 10 mEq by mouth daily.    [provider]  RAPAFLO 8 MG CAPS capsule Take 8 mg by mouth daily. 09/15/15   [provider]  simvastatin (ZOCOR) 40 MG tablet Take 40 mg by mouth daily.    [provider]  torsemide (DEMADEX) 20 MG tablet Take 1 tablet (20 mg total) by mouth 2 (two) times daily. 03/20/17   Yates Decamp, MD  travoprost, benzalkonium, (TRAVATAN) 0.004 % ophthalmic solution Place 1 drop into both eyes at bedtime.    [provider]    Family History Family History  Problem Relation Age of Onset  . Cancer Paternal Grandfather        STOMACH  . Cancer Sister        MALE CANCER  . Cancer - Colon Sister   . Stroke Father   . Healthy Child   . Healthy Child   . Other Child        BRAIN DAMAGE AT BIRTH    Social History Social History  Substance Use Topics  . Smoking  status: Former Smoker    Types: Cigarettes  . Smokeless tobacco: Never Used  . Alcohol use No     Allergies   Patient has no known allergies.   Review of Systems Review of Systems  Constitutional: Negative for chills, diaphoresis, fatigue and fever.  HENT: Negative for congestion.   Eyes: Negative for visual disturbance.  Respiratory: Negative for cough, chest tightness, shortness of breath, wheezing and stridor.   Cardiovascular: Negative for chest pain and palpitations.  Gastrointestinal: Negative for abdominal pain, constipation, diarrhea, nausea and vomiting.  Genitourinary: Positive for dysuria. Negative for decreased urine  volume, flank pain, frequency, hematuria and testicular pain.  Musculoskeletal: Negative for back pain, neck pain and neck stiffness.  Skin: Negative for wound.  Neurological: Positive for light-headedness. Negative for dizziness, weakness, numbness and headaches.  Psychiatric/Behavioral: Negative for agitation and confusion.  All other systems reviewed and are negative.    Physical Exam Updated Vital Signs BP (!) 118/59 (BP Location: Left Arm)   Pulse 68   Temp (!) 97.5 F (36.4 C) (Oral)   Resp (!) 22   SpO2 100%   Physical Exam  Constitutional: He is oriented to person, place, and time. He appears well-developed and well-nourished. No distress.  HENT:  Head: Normocephalic.  Nose: Nose normal.  Mouth/Throat: Oropharynx is clear and moist. No oropharyngeal exudate.  Eyes: Pupils are equal, round, and reactive to light. Conjunctivae and EOM are normal.  Neck: Normal range of motion.  Cardiovascular: Intact distal pulses.  Bradycardia present.   No murmur heard. Pulmonary/Chest: Effort normal. No stridor. No respiratory distress. He has no wheezes.  Abdominal: Bowel sounds are normal. There is no tenderness.  Neurological: He is alert and oriented to person, place, and time. No sensory deficit. He exhibits normal muscle tone.  Skin: He is not  diaphoretic. No erythema.  Nursing note and vitals reviewed.    ED Treatments / Results  Labs (all labs ordered are listed, but only abnormal results are displayed) Labs Reviewed  CBC WITH DIFFERENTIAL/PLATELET - Abnormal; Notable for the following:       Result Value   RDW 15.6 (*)    All other components within normal limits  COMPREHENSIVE METABOLIC PANEL - Abnormal; Notable for the following:    Potassium 2.1 (*)    Chloride 87 (*)    CO2 38 (*)    Glucose, Bld 222 (*)    BUN 102 (*)    Creatinine, Ser 1.82 (*)    Total Protein 8.3 (*)    Alkaline Phosphatase 189 (*)    Total Bilirubin 2.1 (*)    GFR calc non Af Amer 31 (*)    GFR calc Af Amer 36 (*)    Anion gap 17 (*)    All other components within normal limits  LIPASE, BLOOD - Abnormal; Notable for the following:    Lipase 65 (*)    All other components within normal limits  BRAIN NATRIURETIC PEPTIDE - Abnormal; Notable for the following:    B Natriuretic Peptide 482.5 (*)    All other components within normal limits  PROTIME-INR - Abnormal; Notable for the following:    Prothrombin Time 16.0 (*)    All other components within normal limits  MAGNESIUM - Abnormal; Notable for the following:    Magnesium 2.6 (*)    All other components within normal limits  CBG MONITORING, ED - Abnormal; Notable for the following:    Glucose-Capillary 235 (*)    All other components within normal limits  I-STAT TROPONIN, ED - Abnormal; Notable for the following:    Troponin i, poc 0.16 (*)    All other components within normal limits  I-STAT TROPONIN, ED - Abnormal; Notable for the following:    Troponin i, poc 0.14 (*)    All other components within normal limits  URINE CULTURE  CULTURE, BLOOD (ROUTINE X 2)  CULTURE, BLOOD (ROUTINE X 2)  URINALYSIS, ROUTINE W REFLEX MICROSCOPIC  I-STAT CG4 LACTIC ACID, ED  I-STAT CG4 LACTIC ACID, ED    EKG  EKG Interpretation  Date/Time:  Monday July 24 2017 12:41:21  EDT Ventricular Rate:  55 PR Interval:    QRS Duration: 110 QT Interval:  533 QTC Calculation: 510 R Axis:   -67 Text Interpretation:  Atrial fibrillation LAD, consider left anterior fascicular block Borderline ST depression, anterior leads Prolonged QT interval When compared to prior, no significant changes seen.  No STEMI Confirmed by Theda Belfast ([id number]) on 07/24/2017 3:45:27 PM Also confirmed by Theda Belfast ([id number]), editor Misty Stanley [phone])  on 07/24/2017 3:46:38 PM       Radiology Dg Chest 2 View  Result Date: 07/24/2017 CLINICAL DATA:  Disoriented with AFib EXAM: CHEST  2 VIEW COMPARISON:  06/21/2017 FINDINGS: The lungs are clear without focal pneumonia, edema, pneumothorax or pleural effusion. The cardiopericardial silhouette is within normal limits for size. The visualized bony structures of the thorax are intact. Telemetry leads overlie the chest. IMPRESSION: No active cardiopulmonary disease. Electronically Signed   By: Kennith Center M.D.   On: 07/24/2017 16:29    Procedures Procedures (including critical care time)  Medications Ordered in ED Medications  potassium chloride 10 mEq in 100 mL IVPB (10 mEq Intravenous New Bag/Given 07/24/17 1632)  sodium chloride 0.9 % bolus 500 mL (0 mLs Intravenous Stopped 07/24/17 1554)     Initial Impression / Assessment and Plan / ED Course  I have reviewed the triage vital signs and the nursing notes.  Pertinent labs & imaging results that were available during my care of the patient were reviewed by me and considered in my medical decision making (see chart for details).     [name] [name] is a 83 y.o. male with a past medical history significant for CHF, diabetes, hypertension, hyperlipidemia, CK D, sick sinus syndrome with a loop recorder in place, and recent hip fracture status post repair who presents from his rehabilitation facility for lightheadedness, hypotension, dysuria, and general malaise. According to the  paperwork that accompanied patient, patient's blood pressure was 86/62 when he was feeling lightheaded at his facility. Patient reports no fevers or chills but does report some general malaise. He also has some dysuria with urination. He denies constipation, diarrhea, chest pain, congestion, cough, shortness breath, or abdominal pain. He denies any changes in his leg pain. He denies any palpitations.  On exam, patient is slightly bradycardic with a heart rate in the 50s. Patient's lungs are clear. Abdomen is nontender. Legs are nontender. Normal sensation and strength and lower extremity. Normal pulses.  Patient received small amount of fluids with improvement in blood pressure. Troponin was found to be positive but downtrending. Lactic acid nonelevated. CBC showed no leukocytosis or anemia. Patient was not febrile on rectal temperature. Patient was ever found to have hypokalemia with a potassium of 2.1. Patient's creatinine had improved from prior however, given the bradycardia, elevated troponin, and his hypotension, suspect patient may have electrolyte abnormalities and fluid imbalances requiring admission and observation. Next  Loop recorder did not reveal any significant abnormalities on the faxed findings.  Hospitalist team called for admission given a potassium of 2.1. Patient started potassium repletion through IV.  Patient will be admitted for further management.    Final Clinical Impressions(s) / ED Diagnoses   Final diagnoses:  Hypokalemia  Dehydration  Dysuria  Lightheadedness    Clinical Impression: 1. Hypokalemia   2. Dehydration   3. Dysuria   4. Lightheadedness     Disposition: Admit to hospitalist service    Tegeler, Canary Brim, MD 07/24/17 [phone]

## 2017-07-24 NOTE — H&P (Addendum)
History and Physical    [name] [name] NGE:[id number] DOB: 01-22-1925 DOA: 07/24/2017  I have briefly reviewed the patient's prior medical records in [facility]  PCP: Tisovec, Adelfa Koh, MD  Patient coming from: SNF  Chief Complaint: weakness  HPI: [name] [name] is a 83 y.o. male with medical history significant of diastolic CHF, chronic kidney disease stage III, diabetes mellitus, hypertension, hyperlipidemia, recently hospitalized with a hip fracture in August 2018, and currently residing in [place], is being brought to the hospital for weakness.  Patient tells me that over the last few days he has been getting progressively weak, lightheaded and dizzy.  He was also found to be hypotensive in the SNF with blood pressure into the 90s prior to transfer per EDP report.  Patient's daughter and granddaughter at bedside and provides some of the history, they tell me that patient has been having significant weight loss and lost about 50 pounds in the last month.  They tell me he was significantly fluid overloaded and had a lot of edema when he was hospitalized, he was placed on diuretics has been taking dose since he has been in the SNF.  He is also been getting potassium supplementations.  Patient denies any fevers or chills, he denies any cough or chest congestion.  He denies any chest pain or shortness of breath.  He was recently diagnosed with a urinary tract infection and finished a course of ciprofloxacin.  He has no abdominal pain, no nausea vomiting or diarrhea.  He is occasionally constipated.  Daughter tells me that he has a good appetite and has been eating well.  ED Course: In the emergency room patient's blood pressure is in the 1 teens, he is slightly bradycardic into the 50s, and he is on room air.  His blood work is pertinent for a creatinine of 1.8, and a critically low potassium of 2.1.  He was ordered 6 rounds of IV potassium, and TRH was asked for admission for dehydration and  hypokalemia.  Review of Systems: As per HPI otherwise 10 point review of systems negative.   Past Medical History:  Diagnosis Date  . BPH with elevated PSA   . Chest pain   . Chronic kidney disease, stage III (moderate)   . DM (diabetes mellitus), type 2 with renal complications (HCC)   . ED (erectile dysfunction)   . Glaucoma   . Hyperlipidemia   . Hypertension   . Microalbuminuria   . Microalbuminuria 2013  . Obesity   . Osteoarthritis   . Prostatic hypertrophy, benign, with obstruction   . Renal calculus   . Sick sinus syndrome [facility])     Past Surgical History:  Procedure Laterality Date  . INTRAMEDULLARY (IM) NAIL INTERTROCHANTERIC Left 06/21/2017   Procedure: INTRAMEDULLARY (IM) NAIL INTERTROCHANTRIC Left Leg;  Surgeon: Sheral Apley, MD;  Location: [facility] OR;  Service: Orthopedics;  Laterality: Left;  . INTRAVASCULAR PRESSURE WIRE/FFR STUDY N/A 04/18/2017   Procedure: Intravascular Pressure Wire/FFR Study;  Surgeon: Yates Decamp, MD;  Location: [facility] INVASIVE CV LAB;  Service: Cardiovascular;  Laterality: N/A;  Prox LAD  . KNEE SURGERY    . LOOP RECORDER INSERTION N/A 06/27/2017   Procedure: LOOP RECORDER INSERTION;  Surgeon: Hillis Range, MD;  Location: [facility] INVASIVE CV LAB;  Service: Cardiovascular;  Laterality: N/A;  . REPLACEMENT TOTAL KNEE  02/26/01     reports that he has quit smoking. His smoking use included Cigarettes. He has never used smokeless tobacco. He reports that  he does not drink alcohol or use drugs.  No Known Allergies  Family History  Problem Relation Age of Onset  . Cancer Paternal Grandfather        STOMACH  . Cancer Sister        MALE CANCER  . Cancer - Colon Sister   . Stroke Father   . Healthy Child   . Healthy Child   . Other Child        BRAIN DAMAGE AT BIRTH    Prior to Admission medications   Medication Sig Start Date End Date Taking? Authorizing Provider  acetaminophen (TYLENOL) 325 MG tablet Take 650 mg by mouth every 4 (four) hours  as needed.   Yes [provider]  allopurinol (ZYLOPRIM) 100 MG tablet Take 100 mg by mouth daily.   Yes [provider]  brinzolamide (AZOPT) 1 % ophthalmic suspension Place 1 drop into both eyes at bedtime.    Yes [provider]  docusate sodium (COLACE) 100 MG capsule Take 1 capsule (100 mg total) by mouth 2 (two) times daily. 06/28/17  Yes Rai, Ripudeep K, MD  ELIQUIS 5 MG TABS tablet Take 2.5 mg by mouth 2 (two) times daily.  02/22/17  Yes [provider]  finasteride (PROSCAR) 5 MG tablet Take 5 mg by mouth daily.   Yes [provider]  glimepiride (AMARYL) 2 MG tablet Take 2 mg by mouth daily with breakfast.   Yes [provider]  HYDROcodone-acetaminophen (NORCO) 5-325 MG tablet Take 1 tablet by mouth every 6 (six) hours as needed for moderate pain. 06/21/17  Yes Albina Billet III, PA-C  iron polysaccharides (NIFEREX) 150 MG capsule Take 1 capsule (150 mg total) by mouth daily. 06/28/17  Yes Rai, Ripudeep K, MD  MYRBETRIQ 50 MG TB24 tablet Take 50 mg by mouth daily. 11/27/15  Yes [provider]  potassium chloride (K-DUR) 10 MEQ tablet Take 10 mEq by mouth daily.   Yes [provider]  RAPAFLO 8 MG CAPS capsule Take 8 mg by mouth daily. 09/15/15  Yes [provider]  simvastatin (ZOCOR) 40 MG tablet Take 40 mg by mouth daily.   Yes [provider]  torsemide (DEMADEX) 20 MG tablet Take 1 tablet (20 mg total) by mouth 2 (two) times daily. 03/20/17  Yes Yates Decamp, MD  travoprost, benzalkonium, (TRAVATAN) 0.004 % ophthalmic solution Place 1 drop into both eyes at bedtime.   Yes [provider]  ciprofloxacin (CIPRO) 250 MG tablet Take 250 mg by mouth 2 (two) times daily.    [provider]  furosemide (LASIX) 20 MG tablet Take 20 mg by mouth daily.    [provider]  glimepiride (AMARYL) 4 MG tablet Take 0.5 tablets (2 mg total) by mouth daily. Patient not taking:  Reported on 07/24/2017 03/20/17   Yates Decamp, MD  nitroGLYCERIN (NITROSTAT) 0.4 MG SL tablet Place 1 tablet (0.4 mg total) under the tongue every 5 (five) minutes as needed for chest pain. 09/17/15   Nahser, Deloris Ping, MD    Physical Exam: Vitals:   07/24/17 1400 07/24/17 1430 07/24/17 1530 07/24/17 1600  BP: 132/70 128/79 120/67 121/81  Pulse: 60 (!) 56 (!) 56 67  Resp: Temp:      TempSrc:      SpO2: 95% 99% 100% 100%    Constitutional: NAD, calm, comfortable, cachectic appearing Caucasian male Eyes: PERRL, lids and conjunctivae normal ENMT: Mucous membranes are dry. Posterior pharynx clear  of any exudate or lesions. Neck: normal, supple Respiratory: clear to auscultation bilaterally, no wheezing, no crackles. Normal respiratory effort. No accessory muscle use.  Cardiovascular: irregular, 2/6 SEM, no rubs / gallops. No extremity edema. 2+ pedal pulses.  Abdomen: no tenderness, no masses palpated. Bowel sounds positive.  Musculoskeletal: no clubbing / cyanosis.  Decreased muscle tone.  Skin: no rashes, lesions, ulcers. No induration Neurologic: CN 2-12 grossly intact. Strength 5/5 in all 4.  Psychiatric: Normal judgment and insight. Alert and oriented x 3. Normal mood.   Labs on Admission: I have personally reviewed following labs and imaging studies  CBC:  Recent Labs Lab 07/24/17 1432  WBC 7.5  NEUTROABS 5.6  HGB 15.5  HCT 45.4  MCV 87.6  PLT 224   Basic Metabolic Panel:  Recent Labs Lab 07/24/17 1432  NA 142  K 2.1*  CL 87*  CO2 38*  GLUCOSE 222*  BUN 102*  CREATININE 1.82*  CALCIUM 10.1  MG 2.6*   GFR: CrCl cannot be calculated (Unknown ideal weight.). Liver Function Tests:  Recent Labs Lab 07/24/17 1432  AST 40  ALT 26  ALKPHOS 189*  BILITOT 2.1*  PROT 8.3*  ALBUMIN 4.3    Recent Labs Lab 07/24/17 1432  LIPASE 65*   No results for input(s): AMMONIA in the last 168 hours. Coagulation Profile:  Recent Labs Lab 07/24/17 1432   INR 1.29   Cardiac Enzymes: No results for input(s): CKTOTAL, CKMB, CKMBINDEX, TROPONINI in the last 168 hours. BNP (last 3 results) No results for input(s): PROBNP in the last 8760 hours. HbA1C: No results for input(s): HGBA1C in the last 72 hours. CBG:  Recent Labs Lab 07/24/17 1235  GLUCAP 235*   Lipid Profile: No results for input(s): CHOL, HDL, LDLCALC, TRIG, CHOLHDL, LDLDIRECT in the last 72 hours. Thyroid Function Tests: No results for input(s): TSH, T4TOTAL, FREET4, T3FREE, THYROIDAB in the last 72 hours. Anemia Panel: No results for input(s): VITAMINB12, FOLATE, FERRITIN, TIBC, IRON, RETICCTPCT in the last 72 hours. Urine analysis:    Component Value Date/Time   COLORURINE YELLOW 07/24/2017 1432   APPEARANCEUR CLEAR 07/24/2017 1432   LABSPEC 1.010 07/24/2017 1432   PHURINE 6.0 07/24/2017 1432   GLUCOSEU NEGATIVE 07/24/2017 1432   HGBUR NEGATIVE 07/24/2017 1432   BILIRUBINUR NEGATIVE 07/24/2017 1432   KETONESUR NEGATIVE 07/24/2017 1432   PROTEINUR NEGATIVE 07/24/2017 1432   NITRITE NEGATIVE 07/24/2017 1432   LEUKOCYTESUR NEGATIVE 07/24/2017 1432     Radiological Exams on Admission: Dg Chest 2 View  Result Date: 07/24/2017 CLINICAL DATA:  Disoriented with AFib EXAM: CHEST  2 VIEW COMPARISON:  06/21/2017 FINDINGS: The lungs are clear without focal pneumonia, edema, pneumothorax or pleural effusion. The cardiopericardial silhouette is within normal limits for size. The visualized bony structures of the thorax are intact. Telemetry leads overlie the chest. IMPRESSION: No active cardiopulmonary disease. Electronically Signed   By: Kennith Center M.D.   On: 07/24/2017 16:29    EKG: Independently reviewed.  Atrial fibrillation  Assessment/Plan Active Problems:   HTN (hypertension)   DM (diabetes mellitus), type 2 with renal complications (HCC)   Hypertension   Hyperlipidemia   Sick sinus syndrome (HCC)   Osteoarthritis   Chronic kidney disease, stage III  (moderate)   Atrial fibrillation (HCC)   Iron deficiency anemia   Elevated troponin   Bradycardia   Hypokalemia    Hypokalemia -In the setting of diuresis, patient is quite dehydrated on exam and has dry mucous membranes.  He was already  ordered 6 rounds of IV potassium, will place on potassium containing IV fluids, admit on telemetry  Troponin elevation -Patient has a degree of chronic elevation of his troponin, has no chest pain, EKG is nonischemic, already coming down in the ED from 0.16 >> 0.14.  Likely demand  Chronic diastolic CHF -most recent 2D echo was done in August 2018 showed an EF of 55-60%, also mild aortic stenosis with a valve area of 1.5 cm -Hold diuretics, IV fluids for now as he is quite dry  Sick sinus syndrome status post pacemaker -Pacemaker interrogated in the emergency room, no significant findings.  Will monitor on telemetry.  Type 2 diabetes mellitus with renal complications -Place on sliding scale, hold home oral agents  Chronic kidney disease stage III -Creatinine appears at baseline, however BUN increased from mid 70s at baseline to 102 suggesting prerenal cause.  Repeat numbers in the morning  Atrial fibrillation - patient's CHA2DS2-VASc Score for Stroke Risk is > 2, he is on Eliquis, continue.  He is to be on beta blockers however due to bradycardia at this were discontinued last time he was in the hospital.  Continue to hold as he is rate controlled and bradycardic at times  Hypertension -Now hypotensive, likely due to dehydration, no evidence of infectious process going on -Hold diuretics, hydrate as above  Hyperlipidemia -Continue statin  Recent UTI -No evidence of infection in the urine as urinalysis is clear in the ED  BPH -Continue home medications  Recent hip fracture -Continue pain medications -Physical therapy consult  Iron deficiency anemia in the setting of chronic kidney disease -Resume iron, hemoglobin stable   DVT  prophylaxis: Eliquis Code Status: DNR (durable form in the ED room, verified by family) Family Communication: Discussed with daughter and granddaughter at bedside Disposition Plan: Admit to telemetry, likely back to SNF when stable Consults called: None    Admission status: Observation  At the point of initial evaluation, it is my clinical opinion that admission for OBSERVATION is reasonable and necessary because the patient's presenting complaints in the context of their chronic conditions represent sufficient risk of deterioration or significant morbidity to constitute reasonable grounds for close observation in the hospital setting, but that the patient may be medically stable for discharge from the hospital within 24 to 48 hours.    Pamella Pert, MD [facility] Pager [phone]  If 7PM-7AM, please contact night-coverage [email or web address] Password [email or web address]  07/24/2017, 5:07 PM

## 2017-07-25 DIAGNOSIS — R262 Difficulty in walking, not elsewhere classified: Secondary | ICD-10-CM | POA: Diagnosis not present

## 2017-07-25 DIAGNOSIS — Z86010 Personal history of colonic polyps: Secondary | ICD-10-CM | POA: Diagnosis not present

## 2017-07-25 DIAGNOSIS — E785 Hyperlipidemia, unspecified: Secondary | ICD-10-CM | POA: Diagnosis present

## 2017-07-25 DIAGNOSIS — I5032 Chronic diastolic (congestive) heart failure: Secondary | ICD-10-CM | POA: Diagnosis not present

## 2017-07-25 DIAGNOSIS — I481 Persistent atrial fibrillation: Secondary | ICD-10-CM | POA: Diagnosis present

## 2017-07-25 DIAGNOSIS — I10 Essential (primary) hypertension: Secondary | ICD-10-CM | POA: Diagnosis not present

## 2017-07-25 DIAGNOSIS — Z23 Encounter for immunization: Secondary | ICD-10-CM | POA: Diagnosis not present

## 2017-07-25 DIAGNOSIS — N390 Urinary tract infection, site not specified: Secondary | ICD-10-CM | POA: Diagnosis not present

## 2017-07-25 DIAGNOSIS — E119 Type 2 diabetes mellitus without complications: Secondary | ICD-10-CM | POA: Diagnosis not present

## 2017-07-25 DIAGNOSIS — K5900 Constipation, unspecified: Secondary | ICD-10-CM | POA: Diagnosis not present

## 2017-07-25 DIAGNOSIS — I350 Nonrheumatic aortic (valve) stenosis: Secondary | ICD-10-CM | POA: Diagnosis present

## 2017-07-25 DIAGNOSIS — Z87891 Personal history of nicotine dependence: Secondary | ICD-10-CM | POA: Diagnosis not present

## 2017-07-25 DIAGNOSIS — R64 Cachexia: Secondary | ICD-10-CM | POA: Diagnosis present

## 2017-07-25 DIAGNOSIS — E860 Dehydration: Secondary | ICD-10-CM | POA: Diagnosis not present

## 2017-07-25 DIAGNOSIS — I130 Hypertensive heart and chronic kidney disease with heart failure and stage 1 through stage 4 chronic kidney disease, or unspecified chronic kidney disease: Secondary | ICD-10-CM | POA: Diagnosis present

## 2017-07-25 DIAGNOSIS — Z823 Family history of stroke: Secondary | ICD-10-CM | POA: Diagnosis not present

## 2017-07-25 DIAGNOSIS — K219 Gastro-esophageal reflux disease without esophagitis: Secondary | ICD-10-CM | POA: Diagnosis present

## 2017-07-25 DIAGNOSIS — R52 Pain, unspecified: Secondary | ICD-10-CM | POA: Diagnosis not present

## 2017-07-25 DIAGNOSIS — I495 Sick sinus syndrome: Secondary | ICD-10-CM | POA: Diagnosis not present

## 2017-07-25 DIAGNOSIS — R278 Other lack of coordination: Secondary | ICD-10-CM | POA: Diagnosis not present

## 2017-07-25 DIAGNOSIS — Z809 Family history of malignant neoplasm, unspecified: Secondary | ICD-10-CM | POA: Diagnosis not present

## 2017-07-25 DIAGNOSIS — R001 Bradycardia, unspecified: Secondary | ICD-10-CM | POA: Diagnosis not present

## 2017-07-25 DIAGNOSIS — D509 Iron deficiency anemia, unspecified: Secondary | ICD-10-CM | POA: Diagnosis present

## 2017-07-25 DIAGNOSIS — R748 Abnormal levels of other serum enzymes: Secondary | ICD-10-CM | POA: Diagnosis not present

## 2017-07-25 DIAGNOSIS — E876 Hypokalemia: Secondary | ICD-10-CM | POA: Diagnosis present

## 2017-07-25 DIAGNOSIS — I482 Chronic atrial fibrillation: Secondary | ICD-10-CM | POA: Diagnosis not present

## 2017-07-25 DIAGNOSIS — I959 Hypotension, unspecified: Secondary | ICD-10-CM | POA: Diagnosis present

## 2017-07-25 DIAGNOSIS — Z7982 Long term (current) use of aspirin: Secondary | ICD-10-CM | POA: Diagnosis not present

## 2017-07-25 DIAGNOSIS — M6281 Muscle weakness (generalized): Secondary | ICD-10-CM | POA: Diagnosis not present

## 2017-07-25 DIAGNOSIS — I4891 Unspecified atrial fibrillation: Secondary | ICD-10-CM | POA: Diagnosis not present

## 2017-07-25 DIAGNOSIS — R4182 Altered mental status, unspecified: Secondary | ICD-10-CM | POA: Diagnosis not present

## 2017-07-25 DIAGNOSIS — Z7901 Long term (current) use of anticoagulants: Secondary | ICD-10-CM | POA: Diagnosis not present

## 2017-07-25 DIAGNOSIS — Z111 Encounter for screening for respiratory tuberculosis: Secondary | ICD-10-CM | POA: Diagnosis not present

## 2017-07-25 DIAGNOSIS — N401 Enlarged prostate with lower urinary tract symptoms: Secondary | ICD-10-CM | POA: Diagnosis not present

## 2017-07-25 DIAGNOSIS — Z7984 Long term (current) use of oral hypoglycemic drugs: Secondary | ICD-10-CM | POA: Diagnosis not present

## 2017-07-25 DIAGNOSIS — Z6823 Body mass index (BMI) 23.0-23.9, adult: Secondary | ICD-10-CM | POA: Diagnosis not present

## 2017-07-25 DIAGNOSIS — M19049 Primary osteoarthritis, unspecified hand: Secondary | ICD-10-CM | POA: Diagnosis not present

## 2017-07-25 DIAGNOSIS — N183 Chronic kidney disease, stage 3 (moderate): Secondary | ICD-10-CM | POA: Diagnosis not present

## 2017-07-25 DIAGNOSIS — E1122 Type 2 diabetes mellitus with diabetic chronic kidney disease: Secondary | ICD-10-CM | POA: Diagnosis not present

## 2017-07-25 DIAGNOSIS — R300 Dysuria: Secondary | ICD-10-CM | POA: Diagnosis not present

## 2017-07-25 DIAGNOSIS — N400 Enlarged prostate without lower urinary tract symptoms: Secondary | ICD-10-CM | POA: Diagnosis present

## 2017-07-25 DIAGNOSIS — Z96659 Presence of unspecified artificial knee joint: Secondary | ICD-10-CM | POA: Diagnosis present

## 2017-07-25 DIAGNOSIS — I2510 Atherosclerotic heart disease of native coronary artery without angina pectoris: Secondary | ICD-10-CM | POA: Diagnosis not present

## 2017-07-25 DIAGNOSIS — I509 Heart failure, unspecified: Secondary | ICD-10-CM | POA: Diagnosis not present

## 2017-07-25 DIAGNOSIS — I5033 Acute on chronic diastolic (congestive) heart failure: Secondary | ICD-10-CM | POA: Diagnosis present

## 2017-07-25 DIAGNOSIS — M1990 Unspecified osteoarthritis, unspecified site: Secondary | ICD-10-CM | POA: Diagnosis present

## 2017-07-25 DIAGNOSIS — R7989 Other specified abnormal findings of blood chemistry: Secondary | ICD-10-CM | POA: Diagnosis not present

## 2017-07-25 LAB — COMPREHENSIVE METABOLIC PANEL
ALBUMIN: 4 g/dL (ref 3.5–5.0)
ALT: 20 U/L (ref 17–63)
ANION GAP: 12 (ref 5–15)
AST: 38 U/L (ref 15–41)
Alkaline Phosphatase: 161 U/L — ABNORMAL HIGH (ref 38–126)
BUN: 86 mg/dL — AB (ref 6–20)
CHLORIDE: 97 mmol/L — AB (ref 101–111)
CO2: 37 mmol/L — AB (ref 22–32)
Calcium: 9.8 mg/dL (ref 8.9–10.3)
Creatinine, Ser: 1.55 mg/dL — ABNORMAL HIGH (ref 0.61–1.24)
GFR calc Af Amer: 44 mL/min — ABNORMAL LOW (ref >60)
GFR calc non Af Amer: 38 mL/min — ABNORMAL LOW (ref >60)
GLUCOSE: 110 mg/dL — AB (ref 65–99)
POTASSIUM: 2.4 mmol/L — AB (ref 3.5–5.1)
SODIUM: 146 mmol/L — AB (ref 135–145)
TOTAL PROTEIN: 7.4 g/dL (ref 6.5–8.1)
Total Bilirubin: 2.2 mg/dL — ABNORMAL HIGH (ref 0.3–1.2)

## 2017-07-25 LAB — URINE CULTURE: Culture: NO GROWTH

## 2017-07-25 LAB — GLUCOSE, CAPILLARY
GLUCOSE-CAPILLARY: 131 mg/dL — AB (ref 65–99)
GLUCOSE-CAPILLARY: 157 mg/dL — AB (ref 65–99)
Glucose-Capillary: 132 mg/dL — ABNORMAL HIGH (ref 65–99)
Glucose-Capillary: 185 mg/dL — ABNORMAL HIGH (ref 65–99)

## 2017-07-25 LAB — CBC
HCT: 42.5 % (ref 39.0–52.0)
Hemoglobin: 14.4 g/dL (ref 13.0–17.0)
MCH: 29.9 pg (ref 26.0–34.0)
MCHC: 33.9 g/dL (ref 30.0–36.0)
MCV: 88.4 fL (ref 78.0–100.0)
PLATELETS: 192 10*3/uL (ref 150–400)
RBC: 4.81 MIL/uL (ref 4.22–5.81)
RDW: 15.6 % — AB (ref 11.5–15.5)
WBC: 7.3 10*3/uL (ref 4.0–10.5)

## 2017-07-25 LAB — POTASSIUM: POTASSIUM: 3.1 mmol/L — AB (ref 3.5–5.1)

## 2017-07-25 MED ORDER — POTASSIUM CHLORIDE CRYS ER 20 MEQ PO TBCR
40.0000 meq | EXTENDED_RELEASE_TABLET | ORAL | Status: AC
  Administered 2017-07-25 (×3): 40 meq via ORAL
  Filled 2017-07-25 (×3): qty 2

## 2017-07-25 NOTE — Progress Notes (Addendum)
PROGRESS NOTE  [name] [name] WUJ:[id number] DOB: [birth year] DOA: 07/24/2017 PCP: Gaspar Garbe, MD   LOS: 0 days   Brief Narrative / Interim history: 83 y.o. male with medical history significant of diastolic CHF, chronic kidney disease stage III, diabetes mellitus, hypertension, hyperlipidemia, recently hospitalized with a hip fracture in August 2018, and currently residing in [place], is being brought to the hospital for weakness.  Patient tells me that over the last few days he has been getting progressively weak, lightheaded and dizzy.  He was also found to be hypotensive in the SNF with blood pressure into the 90s prior to transfer per EDP report.  He was felt to be quite dehydrated, and with hypokalemia was admitted to the hospital.  Assessment & Plan: Active Problems:   HTN (hypertension)   DM (diabetes mellitus), type 2 with renal complications (HCC)   Hypertension   Hyperlipidemia   Sick sinus syndrome (HCC)   Osteoarthritis   Chronic kidney disease, stage III (moderate)   Atrial fibrillation (HCC)   Iron deficiency anemia   Elevated troponin   Bradycardia   Hypokalemia   Hypokalemia -In the setting of diuresis, patient is quite dehydrated on exam and has dry mucous membranes.  -Still appears volume down this morning, continue IV fluids, potassium little bit better at 2.4, continue to replete.  Recheck BMP tomorrow morning  Troponin elevation -Patient has a degree of chronic elevation of his troponin, has no chest pain, EKG is nonischemic, already coming down in the ED from 0.16 >> 0.14.  Likely demand  Chronic diastolic CHF -most recent 2D echo was done in August 2018 showed an EF of 55-60%, also mild aortic stenosis with a valve area of 1.5 cm -Hold diuretics, remain on IV fluids, closely monitor respiratory status -Cardiology consulted, discussed with Dr. Jacinto Halim  Sick sinus syndrome status post pacemaker -Pacemaker interrogated in the emergency room, no  significant findings.  Will monitor on telemetry.  No events overnight  Type 2 diabetes mellitus with renal complications -Place on sliding scale, hold home oral agents.  CBGs controlled into the low 100s, continue current regimen  Chronic kidney disease stage III -His creatinine is improving from 1.82 on admission to 1.55 this morning.  BUN improving from 102 -->> 86.  Recheck a BMP tomorrow morning  Atrial fibrillation - patient's CHA2DS2-VASc Score for Stroke Risk is > 2, he is on Eliquis, continue.  He used to be on beta blockers however due to bradycardia these were discontinued last time he was in the hospital.  Continue to hold as he is rate controlled and bradycardic at times  Hypertension -Now hypotensive, likely due to dehydration, no evidence of infectious process going on -Hold diuretics, hydrate as above  Hyperlipidemia -Continue statin  Recent UTI -No evidence of infection in the urine as urinalysis is clear in the ED  BPH -Continue home medications  Recent hip fracture -Continue pain medications -Physical therapy consult recommending SNF today  Iron deficiency anemia in the setting of chronic kidney disease -Resume iron, hemoglobin stable  Mild bilirubin increased -Has been increased since August, overall stable, unclear clinical significance as patient is asymptomatic, has no abdominal pain, nausea or vomiting and is able to tolerate a diet without issues   DVT prophylaxis: Eliquis Code Status: DNR Family Communication: no family at bedside Disposition Plan: SNF 1-2 days  Consultants:   Cardiology Dr. Jacinto Halim  Procedures:   None   Antimicrobials:  None    Subjective: - no chest pain,  shortness of breath, no abdominal pain, nausea or vomiting.   Objective: Vitals:   07/24/17 1807 07/24/17 1908 07/24/17 2122 07/25/17 0643  BP: 113/71 118/70 (!) 120/55 133/69  Pulse: (!) 56 61 (!) 52 65  Resp: Temp:  (!) 97.5 F (36.4 C) (!)  97.4 F (36.3 C) 97.8 F (36.6 C)  TempSrc:  Oral Axillary Axillary  SpO2: 100% 99%  98%  Weight:  67.2 kg (148 lb 2.4 oz)  67.8 kg (149 lb 7.6 oz)  Height:   (1.727 m)      Intake/Output Summary (Last 24 hours) at 07/25/17 1220 Last data filed at 07/25/17 0606  Gross per 24 hour  Intake          1176.25 ml  Output              350 ml  Net           826.25 ml   Filed Weights   07/24/17 1908 07/25/17 0643  Weight: 67.2 kg (148 lb 2.4 oz) 67.8 kg (149 lb 7.6 oz)    Examination:  Constitutional: NAD Eyes: lids and conjunctivae normal ENMT: Mucous membranes are dry. No oropharyngeal exudates Respiratory: clear to auscultation bilaterally, no wheezing, no crackles. Normal respiratory effort. No accessory muscle use.  Cardiovascular: irregular. No LE edema. 2+ pedal pulses. No carotid bruits.  Abdomen: no tenderness. Bowel sounds positive.  Skin: no rashes, lesions, ulcers. No induration Neurologic: CN 2-12 grossly intact. Strength 5/5 in all 4.  Psychiatric: Normal judgment and insight. Alert and oriented x 3. Normal mood.    Data Reviewed: I have independently reviewed following labs and imaging studies  CBC:  Recent Labs Lab 07/24/17 1432 07/25/17 1024  WBC 7.5 7.3  NEUTROABS 5.6  --   HGB 15.5 14.4  HCT 45.4 42.5  MCV 87.6 88.4  PLT 224 192   Basic Metabolic Panel:  Recent Labs Lab 07/24/17 1432 07/25/17 1024  NA 142 146*  K 2.1* 2.4*  CL 87* 97*  CO2 38* 37*  GLUCOSE 222* 110*  BUN 102* 86*  CREATININE 1.82* 1.55*  CALCIUM 10.1 9.8  MG 2.6*  --    GFR: Estimated Creatinine Clearance: 30.4 mL/min (A) (by C-G formula based on SCr of 1.55 mg/dL (H)). Liver Function Tests:  Recent Labs Lab 07/24/17 1432 07/25/17 1024  AST 40 38  ALT 26 20  ALKPHOS 189* 161*  BILITOT 2.1* 2.2*  PROT 8.3* 7.4  ALBUMIN 4.3 4.0    Recent Labs Lab 07/24/17 1432  LIPASE 65*   No results for input(s): AMMONIA in the last 168 hours. Coagulation  Profile:  Recent Labs Lab 07/24/17 1432  INR 1.29   Cardiac Enzymes: No results for input(s): CKTOTAL, CKMB, CKMBINDEX, TROPONINI in the last 168 hours. BNP (last 3 results) No results for input(s): PROBNP in the last 8760 hours. HbA1C: No results for input(s): HGBA1C in the last 72 hours. CBG:  Recent Labs Lab 07/24/17 1235 07/24/17 2321 07/25/17 0735  GLUCAP 235* 165* 131*   Lipid Profile: No results for input(s): CHOL, HDL, LDLCALC, TRIG, CHOLHDL, LDLDIRECT in the last 72 hours. Thyroid Function Tests: No results for input(s): TSH, T4TOTAL, FREET4, T3FREE, THYROIDAB in the last 72 hours. Anemia Panel: No results for input(s): VITAMINB12, FOLATE, FERRITIN, TIBC, IRON, RETICCTPCT in the last 72 hours. Urine analysis:    Component Value Date/Time   COLORURINE YELLOW 07/24/2017 1432   APPEARANCEUR CLEAR 07/24/2017 1432   LABSPEC 1.010  07/24/2017 1432   PHURINE 6.0 07/24/2017 1432   GLUCOSEU NEGATIVE 07/24/2017 1432   HGBUR NEGATIVE 07/24/2017 1432   BILIRUBINUR NEGATIVE 07/24/2017 1432   KETONESUR NEGATIVE 07/24/2017 1432   PROTEINUR NEGATIVE 07/24/2017 1432   NITRITE NEGATIVE 07/24/2017 1432   LEUKOCYTESUR NEGATIVE 07/24/2017 1432   Sepsis Labs: Invalid input(s): PROCALCITONIN, LACTICIDVEN  No results found for this or any previous visit (from the past 240 hour(s)).    Radiology Studies: Dg Chest 2 View  Result Date: 07/24/2017 CLINICAL DATA:  Disoriented with AFib EXAM: CHEST  2 VIEW COMPARISON:  06/21/2017 FINDINGS: The lungs are clear without focal pneumonia, edema, pneumothorax or pleural effusion. The cardiopericardial silhouette is within normal limits for size. The visualized bony structures of the thorax are intact. Telemetry leads overlie the chest. IMPRESSION: No active cardiopulmonary disease. Electronically Signed   By: Kennith Center M.D.   On: 07/24/2017 16:29     Scheduled Meds: . allopurinol  100 mg Oral Daily  . apixaban  2.5 mg Oral BID  .  brinzolamide  1 drop Both Eyes QHS  . docusate sodium  100 mg Oral BID  . finasteride  5 mg Oral Daily  . insulin aspart  0-9 Units Subcutaneous TID WC  . iron polysaccharides  150 mg Oral Daily  . latanoprost  1 drop Both Eyes QHS  . mirabegron ER  50 mg Oral Daily  . potassium chloride  40 mEq Oral Q3H  . simvastatin  40 mg Oral Daily  . tamsulosin  0.4 mg Oral QPC supper   Continuous Infusions: . 0.9 % NaCl with KCl 40 mEq / L 75 mL/hr (07/25/17 1610)    Pamella Pert, MD, PhD [facility] Pager [phone]  If 7PM-7AM, please contact night-coverage [email or web address] Password [email or web address] 07/25/2017, 12:20 PM

## 2017-07-25 NOTE — Evaluation (Signed)
Physical Therapy Evaluation Patient Details Name: [name] [name] MRN: [id number] DOB: 11/20/1924 Today's Date: 07/25/2017   History of Present Illness  83 yo male admitted with hypokalemia. Hx of CHF, CKD, DM, HTN, L hip fx-IM nail 05/2017. Pt is from a SNF.   Clinical Impression  Bed level eval only this session. Session limited by lethargy. Pt did open eyes briefly. He followed commands and participated with UE/LE assessment. Too lethargic to attempt any further mobility. No family present during session. Recommend return to SNF once medically stable. Will follow and progress activity as able.     Follow Up Recommendations SNF    Equipment Recommendations  None recommended by PT    Recommendations for Other Services       Precautions / Restrictions Precautions Precautions: Fall Restrictions Weight Bearing Restrictions: No      Mobility  Bed Mobility               General bed mobility comments: NT-pt very lethargic. He followed commands for UE /LE assessment then fell back asleep.   Transfers                    Ambulation/Gait                Stairs            Wheelchair Mobility    Modified Rankin (Stroke Patients Only)       Balance                                             Pertinent Vitals/Pain Pain Assessment: Faces Faces Pain Scale: No hurt    Home Living Family/patient expects to be discharged to:: Skilled nursing facility                      Prior Function Level of Independence: Needs assistance         Comments: discharged to SNF for rehab after L hip surgery 05/2017     Hand Dominance        Extremity/Trunk Assessment   Upper Extremity Assessment Upper Extremity Assessment: Generalized weakness    Lower Extremity Assessment Lower Extremity Assessment: Generalized weakness       Communication   Communication: HOH  Cognition Arousal/Alertness: Lethargic Behavior During  Therapy: WFL for tasks assessed/performed Overall Cognitive Status: Difficult to assess                                        General Comments      Exercises General Exercises - Lower Extremity Heel Slides: AROM;AAROM;Both;5 reps;Supine   Assessment/Plan    PT Assessment Patient needs continued PT services  PT Problem List Decreased strength;Decreased mobility;Decreased activity tolerance;Decreased balance;Decreased knowledge of use of DME       PT Treatment Interventions DME instruction;Gait training;Therapeutic activities;Therapeutic exercise;Functional mobility training;Patient/family education;Balance training    PT Goals (Current goals can be found in the Care Plan section)  Acute Rehab PT Goals Patient Stated Goal: none stated this session-too lethargic PT Goal Formulation: Patient unable to participate in goal setting Time For Goal Achievement: 08/08/17 Potential to Achieve Goals: Fair    Frequency Min 2X/week   Barriers to discharge        Co-evaluation  AM-PAC PT "6 Clicks" Daily Activity  Outcome Measure Difficulty turning over in bed (including adjusting bedclothes, sheets and blankets)?: Unable Difficulty moving from lying on back to sitting on the side of the bed? : Unable Difficulty sitting down on and standing up from a chair with arms (e.g., wheelchair, bedside commode, etc,.)?: Unable Help needed moving to and from a bed to chair (including a wheelchair)?: Total Help needed walking in hospital room?: Total Help needed climbing 3-5 steps with a railing? : Total 6 Click Score: 6    End of Session   Activity Tolerance: Patient limited by lethargy Patient left: in bed;with call bell/phone within reach;with bed alarm set   PT Visit Diagnosis: Muscle weakness (generalized) (M62.81);Difficulty in walking, not elsewhere classified (R26.2)    Time: 6962-9528 PT Time Calculation (min) (ACUTE ONLY): 8 min   Charges:    PT Evaluation $PT Eval Low Complexity: 1 Low     PT G Codes:   PT G-Codes **NOT FOR INPATIENT CLASS** Functional Assessment Tool Used: AM-PAC 6 Clicks Basic Mobility;Clinical judgement Functional Limitation: Mobility: Walking and moving around Mobility: Walking and Moving Around Current Status (U1324): 100 percent impaired, limited or restricted Mobility: Walking and Moving Around Goal Status (M0102): At least 40 percent but less than 60 percent impaired, limited or restricted      Rebeca Alert, MPT Pager: [phone]

## 2017-07-25 NOTE — Consult Note (Addendum)
Reason for Consult: Hypokalemia in a patient with Diasotlic heart failure Referring Physician: Damien Fusi, MD  [name] [name] is an 83 y.o. male.  HPI:   The patient is a 83 year old male who presents with congestive heart failure. 83 year old patient Caucasian male with chronic diastolic heart failure, pulmonary hypertension, persistent Afib with slow ventricular response, CAD, hypertension, DM, CKD 2, hyperlipidemia, syncopal episode leading to left femur intertrochanteric fracture s/p nail procedure 06/21/2017. Loop recorder was placed in the hospital. On recent follow up with [facility], no alarm events were found. He was seen by me in the clinic on 07/21/2017, with his daughter. He had lost 50 lbs with aggressive diuresis, and inconsistent eating habits in nursing home, without any loss of appetite. I had recommended furosemide 20 mg alone for his diuresis, and had recommended stopping torsemide, metolazone, and sildenafil. He was admitted to [facility] on 07/24/2017 with confusion and fatigue. His K was found to be at 2.1. K has been replaced IV, as well as PO since admission   Past Medical History:  Diagnosis Date  . BPH with elevated PSA   . Chest pain   . Chronic kidney disease, stage III (moderate)   . DM (diabetes mellitus), type 2 with renal complications ([place])   . ED (erectile dysfunction)   . Glaucoma   . Hyperlipidemia   . Hypertension   . Microalbuminuria   . Microalbuminuria 2013  . Obesity   . Osteoarthritis   . Prostatic hypertrophy, benign, with obstruction   . Renal calculus   . Sick sinus syndrome [facility])     Past Surgical History:  Procedure Laterality Date  . INTRAMEDULLARY (IM) NAIL INTERTROCHANTERIC Left 06/21/2017   Procedure: INTRAMEDULLARY (IM) NAIL INTERTROCHANTRIC Left Leg;  Surgeon: Renette Butters, MD;  Location: [place];  Service: Orthopedics;  Laterality: Left;  . INTRAVASCULAR PRESSURE WIRE/FFR STUDY N/A 04/18/2017   Procedure: Intravascular  Pressure Wire/FFR Study;  Surgeon: Adrian Prows, MD;  Location: [place] CV LAB;  Service: Cardiovascular;  Laterality: N/A;  Prox LAD  . KNEE SURGERY    . LOOP RECORDER INSERTION N/A 06/27/2017   Procedure: LOOP RECORDER INSERTION;  Surgeon: Thompson Grayer, MD;  Location: [place] CV LAB;  Service: Cardiovascular;  Laterality: N/A;  . REPLACEMENT TOTAL KNEE  02/26/01    Family History  Problem Relation Age of Onset  . Cancer Paternal Grandfather        STOMACH  . Cancer Sister        MALE CANCER  . Cancer - Colon Sister   . Stroke Father   . Healthy Child   . Healthy Child   . Other Child        BRAIN DAMAGE AT BIRTH    Social History:  reports that he has quit smoking. His smoking use included Cigarettes. He has never used smokeless tobacco. He reports that he does not drink alcohol or use drugs.  Allergies: No Known Allergies  Medications: I have reviewed the patient's current medications.  Results for orders placed or performed during the hospital encounter of 07/24/17 (from the past 48 hour(s))  CBG monitoring, ED     Status: Abnormal   Collection Time: 07/24/17 12:35 PM  Result Value Ref Range   Glucose-Capillary 235 (H) 65 - 99 mg/dL  CBC with Differential     Status: Abnormal   Collection Time: 07/24/17  2:32 PM  Result Value Ref Range   WBC 7.5 4.0 - 10.5 K/uL  RBC 5.18 4.22 - 5.81 MIL/uL   Hemoglobin 15.5 13.0 - 17.0 g/dL   HCT 45.4 39.0 - 52.0 %   MCV 87.6 78.0 - 100.0 fL   MCH 29.9 26.0 - 34.0 pg   MCHC 34.1 30.0 - 36.0 g/dL   RDW 15.6 (H) 11.5 - 15.5 %   Platelets 224 150 - 400 K/uL   Neutrophils Relative % 76 %   Neutro Abs 5.6 1.7 - 7.7 K/uL   Lymphocytes Relative 15 %   Lymphs Abs 1.2 0.7 - 4.0 K/uL   Monocytes Relative 6 %   Monocytes Absolute 0.4 0.1 - 1.0 K/uL   Eosinophils Relative 3 %   Eosinophils Absolute 0.2 0.0 - 0.7 K/uL   Basophils Relative 0 %   Basophils Absolute 0.0 0.0 - 0.1 K/uL  Comprehensive metabolic panel     Status:  Abnormal   Collection Time: 07/24/17  2:32 PM  Result Value Ref Range   Sodium 142 135 - 145 mmol/L   Potassium 2.1 (LL) 3.5 - 5.1 mmol/L    Comment: CRITICAL RESULT CALLED TO, READ BACK BY AND VERIFIED WITH: K.HANKS AT 1609 ON 07/24/17 BY N.THOMPSON    Chloride 87 (L) 101 - 111 mmol/L   CO2 38 (H) 22 - 32 mmol/L   Glucose, Bld 222 (H) 65 - 99 mg/dL   BUN 102 (H) 6 - 20 mg/dL    Comment: RESULTS CONFIRMED BY MANUAL DILUTION   Creatinine, Ser 1.82 (H) 0.61 - 1.24 mg/dL   Calcium 10.1 8.9 - 10.3 mg/dL   Total Protein 8.3 (H) 6.5 - 8.1 g/dL   Albumin 4.3 3.5 - 5.0 g/dL   AST 40 15 - 41 U/L   ALT 26 17 - 63 U/L   Alkaline Phosphatase 189 (H) 38 - 126 U/L   Total Bilirubin 2.1 (H) 0.3 - 1.2 mg/dL   GFR calc non Af Amer 31 (L) >60 mL/min   GFR calc Af Amer 36 (L) >60 mL/min    Comment: (NOTE) The eGFR has been calculated using the CKD EPI equation. This calculation has not been validated in all clinical situations. eGFR's persistently <60 mL/min signify possible Chronic Kidney Disease.    Anion gap 17 (H) 5 - 15  Lipase, blood     Status: Abnormal   Collection Time: 07/24/17  2:32 PM  Result Value Ref Range   Lipase 65 (H) 11 - 51 U/L  Urinalysis, Routine w reflex microscopic     Status: None   Collection Time: 07/24/17  2:32 PM  Result Value Ref Range   Color, Urine YELLOW YELLOW   APPearance CLEAR CLEAR   Specific Gravity, Urine 1.010 1.005 - 1.030   pH 6.0 5.0 - 8.0   Glucose, UA NEGATIVE NEGATIVE mg/dL   Hgb urine dipstick NEGATIVE NEGATIVE   Bilirubin Urine NEGATIVE NEGATIVE   Ketones, ur NEGATIVE NEGATIVE mg/dL   Protein, ur NEGATIVE NEGATIVE mg/dL   Nitrite NEGATIVE NEGATIVE   Leukocytes, UA NEGATIVE NEGATIVE    Comment: Microscopic not done on urines with negative protein, blood, leukocytes, nitrite, or glucose < 500 mg/dL.  Urine culture     Status: None   Collection Time: 07/24/17  2:32 PM  Result Value Ref Range   Specimen Description URINE, CLEAN CATCH     Special Requests NONE    Culture      NO GROWTH Performed at [facility], [place] [address] [place], [place] [address]    Report Status 07/25/2017 FINAL  Brain natriuretic peptide     Status: Abnormal   Collection Time: 07/24/17  2:32 PM  Result Value Ref Range   B Natriuretic Peptide 482.5 (H) 0.0 - 100.0 pg/mL  Protime-INR     Status: Abnormal   Collection Time: 07/24/17  2:32 PM  Result Value Ref Range   Prothrombin Time 16.0 (H) 11.4 - 15.2 seconds   INR 1.29   Magnesium     Status: Abnormal   Collection Time: 07/24/17  2:32 PM  Result Value Ref Range   Magnesium 2.6 (H) 1.7 - 2.4 mg/dL  Blood culture (routine x 2)     Status: None (Preliminary result)   Collection Time: 07/24/17  2:39 PM  Result Value Ref Range   Specimen Description BLOOD RIGHT ANTECUBITAL    Special Requests      BOTTLES DRAWN AEROBIC AND ANAEROBIC Blood Culture adequate volume   Culture      NO GROWTH < 24 HOURS Performed at [facility], [address], [place], [place] [address]    Report Status PENDING   I-Stat Troponin, ED (not at [facility])     Status: Abnormal   Collection Time: 07/24/17  2:46 PM  Result Value Ref Range   Troponin i, poc 0.16 (HH) 0.00 - 0.08 ng/mL   Comment NOTIFIED PHYSICIAN    Comment 3            Comment: Due to the release kinetics of cTnI, a negative result within the first hours of the onset of symptoms does not rule out myocardial infarction with certainty. If myocardial infarction is still suspected, repeat the test at appropriate intervals.   Blood culture (routine x 2)     Status: None (Preliminary result)   Collection Time: 07/24/17  2:59 PM  Result Value Ref Range   Specimen Description BLOOD RIGHT WRIST    Special Requests IN PEDIATRIC BOTTLE Blood Culture adequate volume    Culture      NO GROWTH < 24 HOURS Performed at [facility] [address] [place], [place] [address]    Report Status PENDING   I-stat troponin, ED     Status:  Abnormal   Collection Time: 07/24/17  4:43 PM  Result Value Ref Range   Troponin i, poc 0.14 (HH) 0.00 - 0.08 ng/mL   Comment 3            Comment: Due to the release kinetics of cTnI, a negative result within the first hours of the onset of symptoms does not rule out myocardial infarction with certainty. If myocardial infarction is still suspected, repeat the test at appropriate intervals.   I-Stat CG4 Lactic Acid, ED     Status: None   Collection Time: 07/24/17  4:44 PM  Result Value Ref Range   Lactic Acid, Venous 1.52 0.5 - 1.9 mmol/L  Glucose, capillary     Status: Abnormal   Collection Time: 07/24/17 11:21 PM  Result Value Ref Range   Glucose-Capillary 165 (H) 65 - 99 mg/dL  Glucose, capillary     Status: Abnormal   Collection Time: 07/25/17  7:35 AM  Result Value Ref Range   Glucose-Capillary 131 (H) 65 - 99 mg/dL  CBC     Status: Abnormal   Collection Time: 07/25/17 10:24 AM  Result Value Ref Range   WBC 7.3 4.0 - 10.5 K/uL   RBC 4.81 4.22 - 5.81 MIL/uL   Hemoglobin 14.4 13.0 - 17.0 g/dL   HCT 42.5 39.0 -  52.0 %   MCV 88.4 78.0 - 100.0 fL   MCH 29.9 26.0 - 34.0 pg   MCHC 33.9 30.0 - 36.0 g/dL   RDW 15.6 (H) 11.5 - 15.5 %   Platelets 192 150 - 400 K/uL  Comprehensive metabolic panel     Status: Abnormal   Collection Time: 07/25/17 10:24 AM  Result Value Ref Range   Sodium 146 (H) 135 - 145 mmol/L   Potassium 2.4 (LL) 3.5 - 5.1 mmol/L    Comment: CRITICAL RESULT CALLED TO, READ BACK BY AND VERIFIED WITH: HOLDERNESS,H @ 1113 ON [id number] BY POTEAT,S    Chloride 97 (L) 101 - 111 mmol/L   CO2 37 (H) 22 - 32 mmol/L   Glucose, Bld 110 (H) 65 - 99 mg/dL   BUN 86 (H) 6 - 20 mg/dL   Creatinine, Ser 1.55 (H) 0.61 - 1.24 mg/dL   Calcium 9.8 8.9 - 10.3 mg/dL   Total Protein 7.4 6.5 - 8.1 g/dL   Albumin 4.0 3.5 - 5.0 g/dL   AST 38 15 - 41 U/L   ALT 20 17 - 63 U/L   Alkaline Phosphatase 161 (H) 38 - 126 U/L   Total Bilirubin 2.2 (H) 0.3 - 1.2 mg/dL   GFR calc non Af Amer  38 (L) >60 mL/min   GFR calc Af Amer 44 (L) >60 mL/min    Comment: (NOTE) The eGFR has been calculated using the CKD EPI equation. This calculation has not been validated in all clinical situations. eGFR's persistently <60 mL/min signify possible Chronic Kidney Disease.    Anion gap 12 5 - 15  Glucose, capillary     Status: Abnormal   Collection Time: 07/25/17 12:31 PM  Result Value Ref Range   Glucose-Capillary 132 (H) 65 - 99 mg/dL    Dg Chest 2 View  Result Date: 07/24/2017 CLINICAL DATA:  Disoriented with AFib EXAM: CHEST  2 VIEW COMPARISON:  06/21/2017 FINDINGS: The lungs are clear without focal pneumonia, edema, pneumothorax or pleural effusion. The cardiopericardial silhouette is within normal limits for size. The visualized bony structures of the thorax are intact. Telemetry leads overlie the chest. IMPRESSION: No active cardiopulmonary disease. Electronically Signed   By: Misty Stanley M.D.   On: 07/24/2017 16:29    Review of Systems  Constitutional: Negative for malaise/fatigue.  HENT: Negative.   Respiratory: Negative for cough, sputum production and shortness of breath.   Cardiovascular: Negative for chest pain, orthopnea, leg swelling and PND.  Gastrointestinal: Negative for blood in stool, diarrhea and melena.  Genitourinary: Negative for hematuria.  Musculoskeletal:       Left thigh/leg pain. Stable  Skin: Negative.   Neurological: Negative for loss of consciousness.  Endo/Heme/Allergies: Negative.   All other systems reviewed and are negative.  Blood pressure 132/72, pulse (!) 56, temperature 97.8 F (36.6 C), temperature source Axillary, resp. rate 18, height _0  (1.727 m), weight 67.8 kg (149 lb 7.6 oz), SpO2 98 %. Physical Exam  Nursing note and vitals reviewed. Constitutional: He is oriented to person, place, and time. He appears well-developed.  Appears undernourished   HENT:  Head: Normocephalic.  Neck: No JVD present.  Cardiovascular:  Murmur  (II/VI holosystolic murmur RLSB) heard. Irregularly irregular rhythm  Respiratory: Breath sounds normal. No respiratory distress. He has no wheezes. He has no rales.  GI: Soft. Bowel sounds are normal. There is no tenderness.  Musculoskeletal: Normal range of motion. He exhibits no edema.  Neurological: He is alert and oriented  to person, place, and time.  Skin: Skin is warm and dry.  Psychiatric:  Stable    Assessment/Recommendations:  83 year old patient Caucasian male admitted with confusion, hypokalemia.  Diastolic heart failure, persistent Afib with slow ventricular response, CAD, hypertension, DM, CKD 2, hyperlipidemia, H/o syncope and fall leading to left femur intertrochanteric fracture s/p nail procedure 06/21/2017  Hypokalemia: Multifactorial in the setting of diuresis, inconsistent nutrition.  Replacement as you are doing.  Bilirubin elevation: Passive congestion and pulmonary hypertension possible etiology. However, given severe dehydration and hypokalemia, would hold off diuresis.  Weight loss: While diuresis and inconsistent nutrition are possible causes, >50 lbs unintentional weight loss is concerning. I had a long discussion with patient and daughter in clinic earlier this week and we discussed risks/benefits of investigations and they would rather pursue conservative approach.  Chronic diastolic heart failure: While he has had significant fluid overload in the past with pulmonary hypertension, he has lost >50 lbs in last 4 weeks. Agree with holding all diuresis.  Peristent Afib with slow ventricular rate No asystole, complete AV block on telemetry.  Continue apixaban 2.5 mg bid . CHADVASC score 5, high risk, 6.7%  CAD: Known chronic RCA occlusion. Mild troponin elevation, likely supply demand mismatch.Conrtinue statin. Reasonable to hold Aspirin while on eliquis.   Sumner Boesch J Dallyn Bergland 07/25/2017, 3:45 PM   [place], MD [facility] Cardi[facility].  [place] Pager: [phone] Office: [phone] If no answer Cell [phone]

## 2017-07-25 NOTE — Clinical Social Work Note (Signed)
Clinical Social Work Assessment  Patient Details  Name: [name] [name] MRN: [id number] Date of Birth: [birth year]  Date of referral:  07/25/17               Reason for consult:   (pt admitted from facility)                Permission sought to share information with:  Family Supports, Customer service manager Permission granted to share information::  Yes, Verbal Permission Granted  Name::     [name]  Agency::  [facility]  Relationship::     Contact Information:     Housing/Transportation Living arrangements for the past 2 months:  [facility] (SNF for past 3 weeks. Prior to that home with daughter) Source of Information:  Patient, Facility, Medical Team Patient Interpreter Needed:  None Criminal Activity/Legal Involvement Pertinent to Current Situation/Hospitalization:  No - Comment as needed Significant Relationships:  Adult Children, Warehouse manager Lives with:  Facility Resident Do you feel safe going back to the place where you live?  Yes Need for family participation in patient care:  No (Coment)  Care giving concerns:  Pt admitted from [facility], where he has been since 06/28/17. Went for rehab however he has declined since admission there per facility. Reports that prior to that he was residing with daughter at home and was fairly independent. Sustained hip fracture prior to SNF admission.   Social Worker assessment / plan:  CSW consulted as pt is admitted from facility- [facility] SNF. Met with pt at bedside. He was pleasant but very drowsy and confused- able to demonstrate orientation to person and place. Noted pt using mits due to manipulating IVs per staff. Left voicemail for pt's daughter. Spoke with facility and HUB access provided.  Completed FL2.  Will follow- anticipate transfer back to SNF at DC but will confirm with pt/daughter once either pt able to participate more fully or contact is made with pt's daughter.  Employment  status:  Retired Forensic scientist:  Medicare PT Recommendations:  [place] / Referral to community resources:     Patient/Family's Response to care:  Pt pleasant but does not remark on care provided- too drowsy to investigate.   Patient/Family's Understanding of and Emotional Response to Diagnosis, Current Treatment, and Prognosis:  Pt too drowsy to be able to fully participate in plan of care discussion with CSW. Also could not assess emotional response.   Emotional Assessment Appearance:  Appears stated age Attitude/Demeanor/Rapport:   (confused but pleasant) Affect (typically observed):  Calm (drowsy) Orientation:  Oriented to Self, Oriented to Place Alcohol / Substance use:  Not Applicable Psych involvement (Current and /or in the community):  No (Comment)  Discharge Needs  Concerns to be addressed:  No discharge needs identified Readmission within the last 30 days:  Yes Current discharge risk:  None Barriers to Discharge:  Continued Medical Work up   [employer], LCSW 07/25/2017, 2:31 PM [phone]

## 2017-07-25 NOTE — [place] FL2 (Signed)
[place] MEDICAID FL2 LEVEL OF CARE SCREENING TOOL     IDENTIFICATION  Patient Name: [name] [name] Birthdate: 10-19-1925 Sex: male Admission Date (Current Location): 07/24/2017  [facility] and [place] Number:  Producer, television/film/video and Address:  [facility],  [address] [place] [address]      Provider Number: [id number]  Attending Physician Name and Address:  Leatha Gilding, MD  Relative Name and Phone Number:       Current Level of Care: Hospital Recommended Level of Care: Skilled Nursing Facility Prior Approval Number:    Date Approved/Denied:   PASRR Number: [id number]  Discharge Plan: SNF    Current Diagnoses: Patient Active Problem List   Diagnosis Date Noted  . Hypokalemia 07/24/2017  . Closed intertrochanteric fracture of left femur (HCC)   . Bradycardia   . Closed left hip fracture, initial encounter (HCC) 06/21/2017  . Hypotension 06/21/2017  . Syncope 06/21/2017  . Iron deficiency anemia 06/21/2017  . Elevated troponin 06/21/2017  . Closed fracture of left hip (HCC)   . Acute on chronic diastolic heart failure (HCC) 12/01/2016  . Atrial fibrillation (HCC) 02/24/2016  . Bilateral leg weakness 09/17/2015  . Chest pain   . DM (diabetes mellitus), type 2 with renal complications (HCC)   . Hypertension   . Hyperlipidemia   . Prostatic hypertrophy, benign, with obstruction   . Sick sinus syndrome (HCC)   . Obesity   . Microalbuminuria   . Glaucoma   . Renal calculus   . Osteoarthritis   . Chronic kidney disease, stage III (moderate)   . BPH with elevated PSA   . ED (erectile dysfunction)   . RECTAL BLEEDING 04/22/2008  . DYSPHAGIA 04/22/2008  . HYPERLIPIDEMIA 04/18/2008  . HTN (hypertension) 04/18/2008  . ESOPHAGEAL STRICTURE 04/18/2008  . GERD 04/18/2008  . HIATAL HERNIA 04/18/2008  . DIVERTICULOSIS, COLON 04/18/2008  . COLONIC POLYPS, HYPERPLASTIC, HX OF 04/18/2008    Orientation RESPIRATION BLADDER Height & Weight      Self, Place  O2 (2L) Incontinent Weight: 149 lb 7.6 oz (67.8 kg) Height:   (172.7 cm)  BEHAVIORAL SYMPTOMS/MOOD NEUROLOGICAL BOWEL NUTRITION STATUS      Continent Diet (regular diet, thin fluid consistency)  AMBULATORY STATUS COMMUNICATION OF NEEDS Skin   Extensive Assist Verbally Normal                       Personal Care Assistance Level of Assistance  Bathing, Feeding, Dressing Bathing Assistance: Maximum assistance Feeding assistance: Limited assistance Dressing Assistance: Limited assistance     Functional Limitations Info  Sight, Hearing, Speech Sight Info: Adequate Hearing Info: Adequate Speech Info: Adequate    SPECIAL CARE FACTORS FREQUENCY  PT (By licensed PT), OT (By licensed OT)     PT Frequency: 5x OT Frequency: 5x            Contractures Contractures Info: Not present    Additional Factors Info  Code Status, Allergies Code Status Info: DNR Allergies Info: nka           Current Medications (07/25/2017):  This is the current hospital active medication list Current Facility-Administered Medications  Medication Dose Route Frequency Provider Last Rate Last Dose  . 0.9 % NaCl with KCl 40 mEq / L  infusion   Intravenous Continuous Leatha Gilding, MD 50 mL/hr at 07/25/17 1115 50 mL/hr at 07/25/17 1115  . acetaminophen (TYLENOL) tablet 650 mg  650 mg Oral Q4H PRN Gherghe,  Daylene Katayama, MD      . allopurinol (ZYLOPRIM) tablet 100 mg  100 mg Oral Daily Leatha Gilding, MD   100 mg at 07/25/17 0935  . apixaban (ELIQUIS) tablet 2.5 mg  2.5 mg Oral BID Leatha Gilding, MD   2.5 mg at 07/25/17 0934  . brinzolamide (AZOPT) 1 % ophthalmic suspension 1 drop  1 drop Both Eyes QHS Leatha Gilding, MD   1 drop at 07/24/17 2114  . docusate sodium (COLACE) capsule 100 mg  100 mg Oral BID Leatha Gilding, MD   100 mg at 07/25/17 0934  . finasteride (PROSCAR) tablet 5 mg  5 mg Oral Daily Leatha Gilding, MD   5 mg at 07/25/17 0934  .  HYDROcodone-acetaminophen (NORCO/VICODIN) 5-325 MG per tablet 1 tablet  1 tablet Oral Q6H PRN Leatha Gilding, MD   1 tablet at 07/25/17 0349  . insulin aspart (novoLOG) injection 0-9 Units  0-9 Units Subcutaneous TID WC Leatha Gilding, MD   1 Units at 07/25/17 1238  . iron polysaccharides (NIFEREX) capsule 150 mg  150 mg Oral Daily Leatha Gilding, MD   150 mg at 07/25/17 0934  . latanoprost (XALATAN) 0.005 % ophthalmic solution 1 drop  1 drop Both Eyes QHS Leatha Gilding, MD   1 drop at 07/24/17 2157  . mirabegron ER (MYRBETRIQ) tablet 50 mg  50 mg Oral Daily Leatha Gilding, MD   50 mg at 07/25/17 0934  . ondansetron (ZOFRAN) tablet 4 mg  4 mg Oral Q6H PRN Leatha Gilding, MD       Or  . ondansetron (ZOFRAN) injection 4 mg  4 mg Intravenous Q6H PRN Leatha Gilding, MD      . potassium chloride SA (K-DUR,KLOR-CON) CR tablet 40 mEq  40 mEq Oral Q3H Leatha Gilding, MD   40 mEq at 07/25/17 1238  . simvastatin (ZOCOR) tablet 40 mg  40 mg Oral Daily Leatha Gilding, MD   40 mg at 07/25/17 0934  . tamsulosin (FLOMAX) capsule 0.4 mg  0.4 mg Oral QPC supper Leatha Gilding, MD         Discharge Medications: Please see discharge summary for a list of discharge medications.  Relevant Imaging Results:  Relevant Lab Results:   Additional Information SSN- [id number]  Nelwyn Salisbury, LCSW

## 2017-07-25 NOTE — Progress Notes (Signed)
CRITICAL VALUE ALERT  Critical Value:  K 2.4  Date & Time Notied:  07/25/2017 1113   Provider Notified: Dr. Elvera Lennox in person  Orders Received/Actions taken: MD placed orders

## 2017-07-26 DIAGNOSIS — R7989 Other specified abnormal findings of blood chemistry: Secondary | ICD-10-CM | POA: Diagnosis not present

## 2017-07-26 DIAGNOSIS — E876 Hypokalemia: Secondary | ICD-10-CM | POA: Diagnosis not present

## 2017-07-26 DIAGNOSIS — Z111 Encounter for screening for respiratory tuberculosis: Secondary | ICD-10-CM | POA: Diagnosis not present

## 2017-07-26 DIAGNOSIS — Z95818 Presence of other cardiac implants and grafts: Secondary | ICD-10-CM | POA: Diagnosis not present

## 2017-07-26 DIAGNOSIS — I5032 Chronic diastolic (congestive) heart failure: Secondary | ICD-10-CM | POA: Diagnosis not present

## 2017-07-26 DIAGNOSIS — M6281 Muscle weakness (generalized): Secondary | ICD-10-CM | POA: Diagnosis not present

## 2017-07-26 DIAGNOSIS — R262 Difficulty in walking, not elsewhere classified: Secondary | ICD-10-CM | POA: Diagnosis not present

## 2017-07-26 DIAGNOSIS — E871 Hypo-osmolality and hyponatremia: Secondary | ICD-10-CM | POA: Diagnosis not present

## 2017-07-26 DIAGNOSIS — K5900 Constipation, unspecified: Secondary | ICD-10-CM | POA: Diagnosis not present

## 2017-07-26 DIAGNOSIS — I495 Sick sinus syndrome: Secondary | ICD-10-CM | POA: Diagnosis not present

## 2017-07-26 DIAGNOSIS — I10 Essential (primary) hypertension: Secondary | ICD-10-CM | POA: Diagnosis not present

## 2017-07-26 DIAGNOSIS — E1122 Type 2 diabetes mellitus with diabetic chronic kidney disease: Secondary | ICD-10-CM | POA: Diagnosis not present

## 2017-07-26 DIAGNOSIS — I4891 Unspecified atrial fibrillation: Secondary | ICD-10-CM | POA: Diagnosis not present

## 2017-07-26 DIAGNOSIS — R001 Bradycardia, unspecified: Secondary | ICD-10-CM | POA: Diagnosis not present

## 2017-07-26 DIAGNOSIS — I959 Hypotension, unspecified: Secondary | ICD-10-CM | POA: Diagnosis not present

## 2017-07-26 DIAGNOSIS — I482 Chronic atrial fibrillation: Secondary | ICD-10-CM | POA: Diagnosis not present

## 2017-07-26 DIAGNOSIS — R4182 Altered mental status, unspecified: Secondary | ICD-10-CM | POA: Diagnosis not present

## 2017-07-26 DIAGNOSIS — E119 Type 2 diabetes mellitus without complications: Secondary | ICD-10-CM | POA: Diagnosis not present

## 2017-07-26 DIAGNOSIS — I209 Angina pectoris, unspecified: Secondary | ICD-10-CM | POA: Diagnosis not present

## 2017-07-26 DIAGNOSIS — N400 Enlarged prostate without lower urinary tract symptoms: Secondary | ICD-10-CM | POA: Diagnosis not present

## 2017-07-26 DIAGNOSIS — D590 Drug-induced autoimmune hemolytic anemia: Secondary | ICD-10-CM | POA: Diagnosis not present

## 2017-07-26 DIAGNOSIS — N183 Chronic kidney disease, stage 3 (moderate): Secondary | ICD-10-CM | POA: Diagnosis not present

## 2017-07-26 DIAGNOSIS — M19049 Primary osteoarthritis, unspecified hand: Secondary | ICD-10-CM | POA: Diagnosis not present

## 2017-07-26 DIAGNOSIS — R278 Other lack of coordination: Secondary | ICD-10-CM | POA: Diagnosis not present

## 2017-07-26 DIAGNOSIS — D509 Iron deficiency anemia, unspecified: Secondary | ICD-10-CM | POA: Diagnosis not present

## 2017-07-26 DIAGNOSIS — S72143A Displaced intertrochanteric fracture of unspecified femur, initial encounter for closed fracture: Secondary | ICD-10-CM | POA: Diagnosis not present

## 2017-07-26 DIAGNOSIS — R55 Syncope and collapse: Secondary | ICD-10-CM | POA: Diagnosis not present

## 2017-07-26 DIAGNOSIS — E860 Dehydration: Secondary | ICD-10-CM | POA: Diagnosis not present

## 2017-07-26 DIAGNOSIS — E785 Hyperlipidemia, unspecified: Secondary | ICD-10-CM | POA: Diagnosis not present

## 2017-07-26 DIAGNOSIS — N390 Urinary tract infection, site not specified: Secondary | ICD-10-CM | POA: Diagnosis not present

## 2017-07-26 DIAGNOSIS — I272 Pulmonary hypertension, unspecified: Secondary | ICD-10-CM | POA: Diagnosis not present

## 2017-07-26 DIAGNOSIS — Z23 Encounter for immunization: Secondary | ICD-10-CM | POA: Diagnosis not present

## 2017-07-26 DIAGNOSIS — R52 Pain, unspecified: Secondary | ICD-10-CM | POA: Diagnosis not present

## 2017-07-26 DIAGNOSIS — I509 Heart failure, unspecified: Secondary | ICD-10-CM | POA: Diagnosis not present

## 2017-07-26 DIAGNOSIS — S72142D Displaced intertrochanteric fracture of left femur, subsequent encounter for closed fracture with routine healing: Secondary | ICD-10-CM | POA: Diagnosis not present

## 2017-07-26 DIAGNOSIS — N401 Enlarged prostate with lower urinary tract symptoms: Secondary | ICD-10-CM | POA: Diagnosis not present

## 2017-07-26 LAB — BASIC METABOLIC PANEL
ANION GAP: 13 (ref 5–15)
BUN: 69 mg/dL — ABNORMAL HIGH (ref 6–20)
CHLORIDE: 104 mmol/L (ref 101–111)
CO2: 30 mmol/L (ref 22–32)
Calcium: 9.5 mg/dL (ref 8.9–10.3)
Creatinine, Ser: 1.33 mg/dL — ABNORMAL HIGH (ref 0.61–1.24)
GFR calc non Af Amer: 45 mL/min — ABNORMAL LOW (ref >60)
GFR, EST AFRICAN AMERICAN: 53 mL/min — AB (ref >60)
Glucose, Bld: 164 mg/dL — ABNORMAL HIGH (ref 65–99)
POTASSIUM: 3.9 mmol/L (ref 3.5–5.1)
SODIUM: 147 mmol/L — AB (ref 135–145)

## 2017-07-26 LAB — GLUCOSE, CAPILLARY
GLUCOSE-CAPILLARY: 161 mg/dL — AB (ref 65–99)
GLUCOSE-CAPILLARY: 148 mg/dL — AB (ref 65–99)

## 2017-07-26 MED ORDER — DEXTROSE 5 % IV SOLN: INTRAVENOUS | Status: DC

## 2017-07-26 MED ORDER — HYDROCODONE-ACETAMINOPHEN 5-325 MG PO TABS: 1.0000 | ORAL_TABLET | Freq: Four times a day (QID) | ORAL | 0 refills | Status: DC | PRN

## 2017-07-26 NOTE — Plan of Care (Signed)
Problem: Safety: Goal: Ability to remain free from injury will improve Outcome: Not Progressing Pt still a high fall risk   

## 2017-07-26 NOTE — Progress Notes (Signed)
Pt returning to [facility] upon discharge today- room 608- report [phone]. Pt's daughter aware and agreeable to plan. All information provided to facility via the [facility]. Pt will transport via [facility]- completed medical necessity form and will arrange transportation.   Ilean Skill, MSW, LCSW Clinical Social Work 07/26/2017 [phone]

## 2017-07-26 NOTE — Discharge Summary (Signed)
Physician Discharge Summary  CHRISTAIN NIZNIK ZOX:[id number] DOB: 1925/07/26 DOA: 07/24/2017  PCP: Gaspar Garbe, MD  Admit date: 07/24/2017 Discharge date: 07/26/2017  Admitted From:  SNF Disposition:  SNF  Recommendations for Outpatient Follow-up:  1. Follow up with PCP in 1-2 weeks Please obtain BMP/CBC in 48 hours.  Needs to follow up with cardiology to address needs for diuretics.  Follow LFT.      Discharge Condition: Stable.  CODE STATUS: DNR Diet recommendation: Heart Healthy  Brief/Interim Summary: Brief Narrative / Interim history: 83 y.o.malewith medical history significant of diastolic CHF, chronic kidney disease stage III, diabetes mellitus, hypertension, hyperlipidemia, recently hospitalized with a hip fracture in August 2018, and currently residing in [place], is being brought to the hospital for weakness. Patient tells me that over the last few days he has been getting progressively weak, lightheaded and dizzy. He was also found to be hypotensive in the SNF with blood pressure into the 90s prior to transfer per EDP report.  He was felt to be quite dehydrated, and with hypokalemia was admitted to the hospital.  Assessment & Plan: Active Problems:   HTN (hypertension)   DM (diabetes mellitus), type 2 with renal complications (HCC)   Hypertension   Hyperlipidemia   Sick sinus syndrome (HCC)   Osteoarthritis   Chronic kidney disease, stage III (moderate)   Atrial fibrillation (HCC)   Iron deficiency anemia   Elevated troponin   Bradycardia   Hypokalemia   Hypokalemia -In the setting of diuresis, patient is quite dehydrated on exam and has dry mucous membranes.  -Still appears volume down this morning, continue IV fluids, potassium little bit better at 2.4, continue to replete. Resolved.   Mild hypernatremia; patient eating, drinking fluids.  Holding lasix a t discharge.   Troponin elevation -Patient has a degree of chronic elevation of his  troponin, has no chest pain, EKG is nonischemic, already coming down in the ED from 0.16 >>0.14. Likely demand  Chronic diastolic CHF -most recent 2D echo was done in August 2018 showed an EF of 55-60%, also mild aortic stenosis with a valve area of 1.5 cm -Hold diuretics at discharge discussed with cardiology/ patient has appointment Oct 4.  -Cardiology consulted, discussed with Dr. Jacinto Halim -lungs clear to auscultation, no lower extremity edema.   Sick sinus syndrome status post pacemaker -Pacemaker interrogated in the emergency room, no significant findings.  No events overnight  Type 2 diabetes mellitus with renal complications -Place on sliding scale, hold home oral agents.  CBGs controlled into the low 100s, continue current regimen -resume oral meds at discharge.   Chronic kidney disease stage III -His creatinine is improving from 1.82 on admission to 1.55 this morning.  BUN improving from 102 -->> 86.   -Renal function improved. Cr decreases to 1.3. Hold diuretics at discharge.   Atrial fibrillation - patient's CHA2DS2-VASc Score for Stroke Risk is >2, he is on Eliquis, continue. He used to be on beta blockers however due to bradycardia these were discontinued last time he was in the hospital. Continue to hold as he is rate controlled and bradycardic at times  Hypertension -Now hypotensive, likely due to dehydration, no evidence of infectious process going on -Hold diuretics, hydrate as above  Hyperlipidemia -Continue statin  Recent UTI -No evidence of infection in the urine as urinalysis is clear in the ED  BPH -Continue home medications  Recent hip fracture -Continue pain medications -Physical therapy consult recommending SNF  Iron deficiency anemia in the setting  of chronic kidney disease -Resume iron, hemoglobin stable  Mild bilirubin increased -Has been increased since August, overall stable, unclear clinical significance as patient is  asymptomatic, has no abdominal pain, nausea or vomiting and is able to tolerate a diet without issues   Discharge Diagnoses:  Active Problems:   HTN (hypertension)   DM (diabetes mellitus), type 2 with renal complications (HCC)   Hypertension   Hyperlipidemia   Sick sinus syndrome (HCC)   Osteoarthritis   Chronic kidney disease, stage III (moderate)   Atrial fibrillation (HCC)   Iron deficiency anemia   Elevated troponin   Bradycardia   Hypokalemia    Discharge Instructions  Discharge Instructions    Diet - low sodium heart healthy    Complete by:  As directed    Increase activity slowly    Complete by:  As directed      Allergies as of 07/26/2017   No Known Allergies     Medication List    STOP taking these medications   ciprofloxacin 250 MG tablet Commonly known as:  CIPRO   furosemide 20 MG tablet Commonly known as:  LASIX   potassium chloride 10 MEQ tablet Commonly known as:  K-DUR   torsemide 20 MG tablet Commonly known as:  DEMADEX     TAKE these medications   acetaminophen 325 MG tablet Commonly known as:  TYLENOL Take 650 mg by mouth every 4 (four) hours as needed.   allopurinol 100 MG tablet Commonly known as:  ZYLOPRIM Take 100 mg by mouth daily.   brinzolamide 1 % ophthalmic suspension Commonly known as:  AZOPT Place 1 drop into both eyes at bedtime.   docusate sodium 100 MG capsule Commonly known as:  COLACE Take 1 capsule (100 mg total) by mouth 2 (two) times daily.   ELIQUIS 5 MG Tabs tablet Generic drug:  apixaban Take 2.5 mg by mouth 2 (two) times daily.   finasteride 5 MG tablet Commonly known as:  PROSCAR Take 5 mg by mouth daily.   glimepiride 2 MG tablet Commonly known as:  AMARYL Take 2 mg by mouth daily with breakfast. What changed:  Another medication with the same name was removed. Continue taking this medication, and follow the directions you see here.   HYDROcodone-acetaminophen 5-325 MG tablet Commonly known  as:  NORCO Take 1 tablet by mouth every 6 (six) hours as needed for moderate pain.   iron polysaccharides 150 MG capsule Commonly known as:  NIFEREX Take 1 capsule (150 mg total) by mouth daily.   MYRBETRIQ 50 MG Tb24 tablet Generic drug:  mirabegron ER Take 50 mg by mouth daily.   nitroGLYCERIN 0.4 MG SL tablet Commonly known as:  NITROSTAT Place 1 tablet (0.4 mg total) under the tongue every 5 (five) minutes as needed for chest pain.   RAPAFLO 8 MG Caps capsule Generic drug:  silodosin Take 8 mg by mouth daily.   simvastatin 40 MG tablet Commonly known as:  ZOCOR Take 40 mg by mouth daily.   travoprost (benzalkonium) 0.004 % ophthalmic solution Commonly known as:  TRAVATAN Place 1 drop into both eyes at bedtime.            Discharge Care Instructions        Start     Ordered   07/26/17 0000  Increase activity slowly     07/26/17 0916   07/26/17 0000  Diet - low sodium heart healthy     07/26/17 0916  Contact information for follow-up providers    Elder Negus, MD Follow up on 08/03/2017.   Specialty:  Cardiology Why:  12:00 noon Contact information: [address] [place] [address] [phone]            Contact information for after-discharge care    Destination    [facility] Follow up.   Specialty:  Skilled Nursing Facility Contact information: [address] [place] [address] [phone]                 No Known Allergies  Consultations:  Cardiology    Procedures/Studies: Dg Chest 2 View  Result Date: 07/24/2017 CLINICAL DATA:  Disoriented with AFib EXAM: CHEST  2 VIEW COMPARISON:  06/21/2017 FINDINGS: The lungs are clear without focal pneumonia, edema, pneumothorax or pleural effusion. The cardiopericardial silhouette is within normal limits for size. The visualized bony structures of the thorax are intact. Telemetry leads overlie the chest. IMPRESSION: No active  cardiopulmonary disease. Electronically Signed   By: Kennith Center M.D.   On: 07/24/2017 16:29     Subjective: He is eating break=fast. Feeling well. Drinking water.   Discharge Exam: Vitals:   07/25/17 2125 07/26/17 0435  BP: (!) 125/59 121/61  Pulse: 68 61  Resp: 18 18  Temp: 98.2 F (36.8 C) 98.5 F (36.9 C)  SpO2: 97% 96%   Vitals:   07/25/17 0643 07/25/17 1316 07/25/17 2125 07/26/17 0435  BP: 133/69 132/72 (!) 125/59 121/61  Pulse: 65 (!) 56 68 61  Resp: 18  18 18   Temp: 97.8 F (36.6 C)  98.2 F (36.8 C) 98.5 F (36.9 C)  TempSrc: Axillary  Oral Oral  SpO2: 98% 98% 97% 96%  Weight: 67.8 kg (149 lb 7.6 oz)   69 kg (152 lb 1.9 oz)  Height:        General: Pt is alert, awake, not in acute distress Cardiovascular: RRR, S1/S2 +, no rubs, no gallops Respiratory: CTA bilaterally, no wheezing, no rhonchi Abdominal: Soft, NT, ND, bowel sounds + Extremities: no edema, no cyanosis    The results of significant diagnostics from this hospitalization (including imaging, microbiology, ancillary and laboratory) are listed below for reference.     Microbiology: Recent Results (from the past 240 hour(s))  Urine culture     Status: None   Collection Time: 07/24/17  2:32 PM  Result Value Ref Range Status   Specimen Description URINE, CLEAN CATCH  Final   Special Requests NONE  Final   Culture   Final    NO GROWTH Performed at [facility], [address] [place], [place] [address]    Report Status 07/25/2017 FINAL  Final  Blood culture (routine x 2)     Status: None (Preliminary result)   Collection Time: 07/24/17  2:39 PM  Result Value Ref Range Status   Specimen Description BLOOD RIGHT ANTECUBITAL  Final   Special Requests   Final    BOTTLES DRAWN AEROBIC AND ANAEROBIC Blood Culture adequate volume   Culture   Final    NO GROWTH < 24 HOURS Performed at [facility], [address] [place], [place] [address]    Report Status PENDING  Incomplete   Blood culture (routine x 2)     Status: None (Preliminary result)   Collection Time: 07/24/17  2:59 PM  Result Value Ref Range Status   Specimen Description BLOOD RIGHT WRIST  Final   Special Requests IN PEDIATRIC  BOTTLE Blood Culture adequate volume  Final   Culture   Final    NO GROWTH < 24 HOURS Performed at [facility], [address] [place], [place] [address]    Report Status PENDING  Incomplete     Labs: BNP (last 3 results)  Recent Labs  03/17/17 0544 03/18/17 0416 07/24/17 1432  BNP 656.1* 734.5* 482.5*   Basic Metabolic Panel:  Recent Labs Lab 07/24/17 1432 07/25/17 1024 07/25/17 1815 07/26/17 0458  NA 142 146*  --  147*  K 2.1* 2.4* 3.1* 3.9  CL 87* 97*  --  104  CO2 38* 37*  --  30  GLUCOSE 222* 110*  --  164*  BUN 102* 86*  --  69*  CREATININE 1.82* 1.55*  --  1.33*  CALCIUM 10.1 9.8  --  9.5  MG 2.6*  --   --   --    Liver Function Tests:  Recent Labs Lab 07/24/17 1432 07/25/17 1024  AST 40 38  ALT 26 20  ALKPHOS 189* 161*  BILITOT 2.1* 2.2*  PROT 8.3* 7.4  ALBUMIN 4.3 4.0    Recent Labs Lab 07/24/17 1432  LIPASE 65*   No results for input(s): AMMONIA in the last 168 hours. CBC:  Recent Labs Lab 07/24/17 1432 07/25/17 1024  WBC 7.5 7.3  NEUTROABS 5.6  --   HGB 15.5 14.4  HCT 45.4 42.5  MCV 87.6 88.4  PLT 224 192   Cardiac Enzymes: No results for input(s): CKTOTAL, CKMB, CKMBINDEX, TROPONINI in the last 168 hours. BNP: Invalid input(s): POCBNP CBG:  Recent Labs Lab 07/25/17 0735 07/25/17 1231 07/25/17 1651 07/25/17 2121 07/26/17 0834  GLUCAP 131* 132* 185* 157* 161*   D-Dimer No results for input(s): DDIMER in the last 72 hours. Hgb A1c No results for input(s): HGBA1C in the last 72 hours. Lipid Profile No results for input(s): CHOL, HDL, LDLCALC, TRIG, CHOLHDL, LDLDIRECT in the last 72 hours. Thyroid function studies No results for input(s): TSH, T4TOTAL, T3FREE, THYROIDAB in the last 72  hours.  Invalid input(s): FREET3 Anemia work up No results for input(s): VITAMINB12, FOLATE, FERRITIN, TIBC, IRON, RETICCTPCT in the last 72 hours. Urinalysis    Component Value Date/Time   COLORURINE YELLOW 07/24/2017 1432   APPEARANCEUR CLEAR 07/24/2017 1432   LABSPEC 1.010 07/24/2017 1432   PHURINE 6.0 07/24/2017 1432   GLUCOSEU NEGATIVE 07/24/2017 1432   HGBUR NEGATIVE 07/24/2017 1432   BILIRUBINUR NEGATIVE 07/24/2017 1432   KETONESUR NEGATIVE 07/24/2017 1432   PROTEINUR NEGATIVE 07/24/2017 1432   NITRITE NEGATIVE 07/24/2017 1432   LEUKOCYTESUR NEGATIVE 07/24/2017 1432   Sepsis Labs Invalid input(s): PROCALCITONIN,  WBC,  LACTICIDVEN Microbiology Recent Results (from the past 240 hour(s))  Urine culture     Status: None   Collection Time: 07/24/17  2:32 PM  Result Value Ref Range Status   Specimen Description URINE, CLEAN CATCH  Final   Special Requests NONE  Final   Culture   Final    NO GROWTH Performed at [facility], [address] [place], [place] [address]    Report Status 07/25/2017 FINAL  Final  Blood culture (routine x 2)     Status: None (Preliminary result)   Collection Time: 07/24/17  2:39 PM  Result Value Ref Range Status   Specimen Description BLOOD RIGHT ANTECUBITAL  Final   Special Requests   Final    BOTTLES DRAWN AEROBIC AND ANAEROBIC Blood Culture adequate volume   Culture  Final    NO GROWTH < 24 HOURS Performed at [facility], [address], [place], [place] [address]    Report Status PENDING  Incomplete  Blood culture (routine x 2)     Status: None (Preliminary result)   Collection Time: 07/24/17  2:59 PM  Result Value Ref Range Status   Specimen Description BLOOD RIGHT WRIST  Final   Special Requests IN PEDIATRIC BOTTLE Blood Culture adequate volume  Final   Culture   Final    NO GROWTH < 24 HOURS Performed at [facility], [address], [place], [place] [address]    Report Status PENDING  Incomplete      Time coordinating discharge: Over 30 minutes  SIGNED:   Alba Cory, MD  [facility] 07/26/2017, 9:22 AM Pager   If 7PM-7AM, please contact night-coverage [email or web address] Password [email or web address]

## 2017-07-27 ENCOUNTER — Ambulatory Visit (INDEPENDENT_AMBULATORY_CARE_PROVIDER_SITE_OTHER): Payer: Medicare Other | Admitting: *Deleted

## 2017-07-27 DIAGNOSIS — R55 Syncope and collapse: Secondary | ICD-10-CM

## 2017-07-28 DIAGNOSIS — E871 Hypo-osmolality and hyponatremia: Secondary | ICD-10-CM | POA: Diagnosis not present

## 2017-07-28 DIAGNOSIS — I4891 Unspecified atrial fibrillation: Secondary | ICD-10-CM | POA: Diagnosis not present

## 2017-07-28 DIAGNOSIS — E785 Hyperlipidemia, unspecified: Secondary | ICD-10-CM | POA: Diagnosis not present

## 2017-07-28 DIAGNOSIS — I5032 Chronic diastolic (congestive) heart failure: Secondary | ICD-10-CM | POA: Diagnosis not present

## 2017-07-28 DIAGNOSIS — E119 Type 2 diabetes mellitus without complications: Secondary | ICD-10-CM | POA: Diagnosis not present

## 2017-07-28 DIAGNOSIS — N400 Enlarged prostate without lower urinary tract symptoms: Secondary | ICD-10-CM | POA: Diagnosis not present

## 2017-07-28 DIAGNOSIS — I10 Essential (primary) hypertension: Secondary | ICD-10-CM | POA: Diagnosis not present

## 2017-07-28 DIAGNOSIS — R7989 Other specified abnormal findings of blood chemistry: Secondary | ICD-10-CM | POA: Diagnosis not present

## 2017-07-28 DIAGNOSIS — N183 Chronic kidney disease, stage 3 (moderate): Secondary | ICD-10-CM | POA: Diagnosis not present

## 2017-07-28 DIAGNOSIS — N390 Urinary tract infection, site not specified: Secondary | ICD-10-CM | POA: Diagnosis not present

## 2017-07-28 DIAGNOSIS — E876 Hypokalemia: Secondary | ICD-10-CM | POA: Diagnosis not present

## 2017-07-28 DIAGNOSIS — I209 Angina pectoris, unspecified: Secondary | ICD-10-CM | POA: Diagnosis not present

## 2017-07-29 LAB — CULTURE, BLOOD (ROUTINE X 2)
CULTURE: NO GROWTH
Culture: NO GROWTH
Special Requests: ADEQUATE
Special Requests: ADEQUATE

## 2017-07-31 LAB — CUP PACEART REMOTE DEVICE CHECK
Date Time Interrogation Session: 20180927194032
MDC IDC PG IMPLANT DT: 20180828

## 2017-07-31 NOTE — Progress Notes (Signed)
Loop recorder summary report 

## 2017-08-03 DIAGNOSIS — S72143A Displaced intertrochanteric fracture of unspecified femur, initial encounter for closed fracture: Secondary | ICD-10-CM | POA: Diagnosis not present

## 2017-08-03 DIAGNOSIS — I482 Chronic atrial fibrillation: Secondary | ICD-10-CM | POA: Diagnosis not present

## 2017-08-03 DIAGNOSIS — E876 Hypokalemia: Secondary | ICD-10-CM | POA: Diagnosis not present

## 2017-08-03 DIAGNOSIS — I4891 Unspecified atrial fibrillation: Secondary | ICD-10-CM | POA: Diagnosis not present

## 2017-08-03 DIAGNOSIS — I272 Pulmonary hypertension, unspecified: Secondary | ICD-10-CM | POA: Diagnosis not present

## 2017-08-03 DIAGNOSIS — I209 Angina pectoris, unspecified: Secondary | ICD-10-CM | POA: Diagnosis not present

## 2017-08-03 DIAGNOSIS — M6281 Muscle weakness (generalized): Secondary | ICD-10-CM | POA: Diagnosis not present

## 2017-08-03 DIAGNOSIS — I10 Essential (primary) hypertension: Secondary | ICD-10-CM | POA: Diagnosis not present

## 2017-08-03 DIAGNOSIS — I5032 Chronic diastolic (congestive) heart failure: Secondary | ICD-10-CM | POA: Diagnosis not present

## 2017-08-03 DIAGNOSIS — E119 Type 2 diabetes mellitus without complications: Secondary | ICD-10-CM | POA: Diagnosis not present

## 2017-08-03 DIAGNOSIS — E871 Hypo-osmolality and hyponatremia: Secondary | ICD-10-CM | POA: Diagnosis not present

## 2017-08-03 DIAGNOSIS — D590 Drug-induced autoimmune hemolytic anemia: Secondary | ICD-10-CM | POA: Diagnosis not present

## 2017-08-03 DIAGNOSIS — E785 Hyperlipidemia, unspecified: Secondary | ICD-10-CM | POA: Diagnosis not present

## 2017-08-03 DIAGNOSIS — N400 Enlarged prostate without lower urinary tract symptoms: Secondary | ICD-10-CM | POA: Diagnosis not present

## 2017-08-03 DIAGNOSIS — Z95818 Presence of other cardiac implants and grafts: Secondary | ICD-10-CM | POA: Diagnosis not present

## 2017-08-04 DIAGNOSIS — S72142D Displaced intertrochanteric fracture of left femur, subsequent encounter for closed fracture with routine healing: Secondary | ICD-10-CM | POA: Diagnosis not present

## 2017-08-09 DIAGNOSIS — I130 Hypertensive heart and chronic kidney disease with heart failure and stage 1 through stage 4 chronic kidney disease, or unspecified chronic kidney disease: Secondary | ICD-10-CM | POA: Diagnosis not present

## 2017-08-09 DIAGNOSIS — R82998 Other abnormal findings in urine: Secondary | ICD-10-CM | POA: Diagnosis not present

## 2017-08-09 DIAGNOSIS — Z0000 Encounter for general adult medical examination without abnormal findings: Secondary | ICD-10-CM | POA: Diagnosis not present

## 2017-08-09 DIAGNOSIS — I482 Chronic atrial fibrillation: Secondary | ICD-10-CM | POA: Diagnosis not present

## 2017-08-09 DIAGNOSIS — N183 Chronic kidney disease, stage 3 (moderate): Secondary | ICD-10-CM | POA: Diagnosis not present

## 2017-08-09 DIAGNOSIS — E1122 Type 2 diabetes mellitus with diabetic chronic kidney disease: Secondary | ICD-10-CM | POA: Diagnosis not present

## 2017-08-09 DIAGNOSIS — E1129 Type 2 diabetes mellitus with other diabetic kidney complication: Secondary | ICD-10-CM | POA: Diagnosis not present

## 2017-08-09 DIAGNOSIS — Z125 Encounter for screening for malignant neoplasm of prostate: Secondary | ICD-10-CM | POA: Diagnosis not present

## 2017-08-09 DIAGNOSIS — I5033 Acute on chronic diastolic (congestive) heart failure: Secondary | ICD-10-CM | POA: Diagnosis not present

## 2017-08-09 DIAGNOSIS — E7800 Pure hypercholesterolemia, unspecified: Secondary | ICD-10-CM | POA: Diagnosis not present

## 2017-08-09 DIAGNOSIS — I2510 Atherosclerotic heart disease of native coronary artery without angina pectoris: Secondary | ICD-10-CM | POA: Diagnosis not present

## 2017-08-10 DIAGNOSIS — I482 Chronic atrial fibrillation: Secondary | ICD-10-CM | POA: Diagnosis not present

## 2017-08-10 DIAGNOSIS — E1122 Type 2 diabetes mellitus with diabetic chronic kidney disease: Secondary | ICD-10-CM | POA: Diagnosis not present

## 2017-08-10 DIAGNOSIS — N183 Chronic kidney disease, stage 3 (moderate): Secondary | ICD-10-CM | POA: Diagnosis not present

## 2017-08-10 DIAGNOSIS — I5033 Acute on chronic diastolic (congestive) heart failure: Secondary | ICD-10-CM | POA: Diagnosis not present

## 2017-08-10 DIAGNOSIS — I130 Hypertensive heart and chronic kidney disease with heart failure and stage 1 through stage 4 chronic kidney disease, or unspecified chronic kidney disease: Secondary | ICD-10-CM | POA: Diagnosis not present

## 2017-08-10 DIAGNOSIS — I2510 Atherosclerotic heart disease of native coronary artery without angina pectoris: Secondary | ICD-10-CM | POA: Diagnosis not present

## 2017-08-11 DIAGNOSIS — I482 Chronic atrial fibrillation: Secondary | ICD-10-CM | POA: Diagnosis not present

## 2017-08-11 DIAGNOSIS — I2510 Atherosclerotic heart disease of native coronary artery without angina pectoris: Secondary | ICD-10-CM | POA: Diagnosis not present

## 2017-08-11 DIAGNOSIS — I130 Hypertensive heart and chronic kidney disease with heart failure and stage 1 through stage 4 chronic kidney disease, or unspecified chronic kidney disease: Secondary | ICD-10-CM | POA: Diagnosis not present

## 2017-08-11 DIAGNOSIS — E1122 Type 2 diabetes mellitus with diabetic chronic kidney disease: Secondary | ICD-10-CM | POA: Diagnosis not present

## 2017-08-11 DIAGNOSIS — I5033 Acute on chronic diastolic (congestive) heart failure: Secondary | ICD-10-CM | POA: Diagnosis not present

## 2017-08-11 DIAGNOSIS — N183 Chronic kidney disease, stage 3 (moderate): Secondary | ICD-10-CM | POA: Diagnosis not present

## 2017-08-15 DIAGNOSIS — S7292XD Unspecified fracture of left femur, subsequent encounter for closed fracture with routine healing: Secondary | ICD-10-CM | POA: Diagnosis not present

## 2017-08-15 DIAGNOSIS — E1122 Type 2 diabetes mellitus with diabetic chronic kidney disease: Secondary | ICD-10-CM | POA: Diagnosis not present

## 2017-08-15 DIAGNOSIS — I5033 Acute on chronic diastolic (congestive) heart failure: Secondary | ICD-10-CM | POA: Diagnosis not present

## 2017-08-15 DIAGNOSIS — N183 Chronic kidney disease, stage 3 (moderate): Secondary | ICD-10-CM | POA: Diagnosis not present

## 2017-08-15 DIAGNOSIS — I2510 Atherosclerotic heart disease of native coronary artery without angina pectoris: Secondary | ICD-10-CM | POA: Diagnosis not present

## 2017-08-15 DIAGNOSIS — I130 Hypertensive heart and chronic kidney disease with heart failure and stage 1 through stage 4 chronic kidney disease, or unspecified chronic kidney disease: Secondary | ICD-10-CM | POA: Diagnosis not present

## 2017-08-16 DIAGNOSIS — Z6828 Body mass index (BMI) 28.0-28.9, adult: Secondary | ICD-10-CM | POA: Diagnosis not present

## 2017-08-16 DIAGNOSIS — I252 Old myocardial infarction: Secondary | ICD-10-CM | POA: Diagnosis not present

## 2017-08-16 DIAGNOSIS — N3946 Mixed incontinence: Secondary | ICD-10-CM | POA: Diagnosis not present

## 2017-08-16 DIAGNOSIS — Z1389 Encounter for screening for other disorder: Secondary | ICD-10-CM | POA: Diagnosis not present

## 2017-08-16 DIAGNOSIS — I208 Other forms of angina pectoris: Secondary | ICD-10-CM | POA: Diagnosis not present

## 2017-08-16 DIAGNOSIS — I480 Paroxysmal atrial fibrillation: Secondary | ICD-10-CM | POA: Diagnosis not present

## 2017-08-16 DIAGNOSIS — Z0000 Encounter for general adult medical examination without abnormal findings: Secondary | ICD-10-CM | POA: Diagnosis not present

## 2017-08-16 DIAGNOSIS — N183 Chronic kidney disease, stage 3 (moderate): Secondary | ICD-10-CM | POA: Diagnosis not present

## 2017-08-16 DIAGNOSIS — E1129 Type 2 diabetes mellitus with other diabetic kidney complication: Secondary | ICD-10-CM | POA: Diagnosis not present

## 2017-08-16 DIAGNOSIS — I2510 Atherosclerotic heart disease of native coronary artery without angina pectoris: Secondary | ICD-10-CM | POA: Diagnosis not present

## 2017-08-16 DIAGNOSIS — E7800 Pure hypercholesterolemia, unspecified: Secondary | ICD-10-CM | POA: Diagnosis not present

## 2017-08-16 DIAGNOSIS — I509 Heart failure, unspecified: Secondary | ICD-10-CM | POA: Diagnosis not present

## 2017-08-17 DIAGNOSIS — N183 Chronic kidney disease, stage 3 (moderate): Secondary | ICD-10-CM | POA: Diagnosis not present

## 2017-08-17 DIAGNOSIS — I5033 Acute on chronic diastolic (congestive) heart failure: Secondary | ICD-10-CM | POA: Diagnosis not present

## 2017-08-17 DIAGNOSIS — I130 Hypertensive heart and chronic kidney disease with heart failure and stage 1 through stage 4 chronic kidney disease, or unspecified chronic kidney disease: Secondary | ICD-10-CM | POA: Diagnosis not present

## 2017-08-17 DIAGNOSIS — I2510 Atherosclerotic heart disease of native coronary artery without angina pectoris: Secondary | ICD-10-CM | POA: Diagnosis not present

## 2017-08-17 DIAGNOSIS — S7292XD Unspecified fracture of left femur, subsequent encounter for closed fracture with routine healing: Secondary | ICD-10-CM | POA: Diagnosis not present

## 2017-08-17 DIAGNOSIS — E1122 Type 2 diabetes mellitus with diabetic chronic kidney disease: Secondary | ICD-10-CM | POA: Diagnosis not present

## 2017-08-18 DIAGNOSIS — S7292XD Unspecified fracture of left femur, subsequent encounter for closed fracture with routine healing: Secondary | ICD-10-CM | POA: Diagnosis not present

## 2017-08-18 DIAGNOSIS — N183 Chronic kidney disease, stage 3 (moderate): Secondary | ICD-10-CM | POA: Diagnosis not present

## 2017-08-18 DIAGNOSIS — I2510 Atherosclerotic heart disease of native coronary artery without angina pectoris: Secondary | ICD-10-CM | POA: Diagnosis not present

## 2017-08-18 DIAGNOSIS — I5033 Acute on chronic diastolic (congestive) heart failure: Secondary | ICD-10-CM | POA: Diagnosis not present

## 2017-08-18 DIAGNOSIS — E1122 Type 2 diabetes mellitus with diabetic chronic kidney disease: Secondary | ICD-10-CM | POA: Diagnosis not present

## 2017-08-18 DIAGNOSIS — I130 Hypertensive heart and chronic kidney disease with heart failure and stage 1 through stage 4 chronic kidney disease, or unspecified chronic kidney disease: Secondary | ICD-10-CM | POA: Diagnosis not present

## 2017-08-19 DIAGNOSIS — I2510 Atherosclerotic heart disease of native coronary artery without angina pectoris: Secondary | ICD-10-CM | POA: Diagnosis not present

## 2017-08-19 DIAGNOSIS — S7292XD Unspecified fracture of left femur, subsequent encounter for closed fracture with routine healing: Secondary | ICD-10-CM | POA: Diagnosis not present

## 2017-08-19 DIAGNOSIS — E1122 Type 2 diabetes mellitus with diabetic chronic kidney disease: Secondary | ICD-10-CM | POA: Diagnosis not present

## 2017-08-19 DIAGNOSIS — N183 Chronic kidney disease, stage 3 (moderate): Secondary | ICD-10-CM | POA: Diagnosis not present

## 2017-08-19 DIAGNOSIS — I5033 Acute on chronic diastolic (congestive) heart failure: Secondary | ICD-10-CM | POA: Diagnosis not present

## 2017-08-19 DIAGNOSIS — I130 Hypertensive heart and chronic kidney disease with heart failure and stage 1 through stage 4 chronic kidney disease, or unspecified chronic kidney disease: Secondary | ICD-10-CM | POA: Diagnosis not present

## 2017-08-22 DIAGNOSIS — I130 Hypertensive heart and chronic kidney disease with heart failure and stage 1 through stage 4 chronic kidney disease, or unspecified chronic kidney disease: Secondary | ICD-10-CM | POA: Diagnosis not present

## 2017-08-22 DIAGNOSIS — I5033 Acute on chronic diastolic (congestive) heart failure: Secondary | ICD-10-CM | POA: Diagnosis not present

## 2017-08-22 DIAGNOSIS — S7292XD Unspecified fracture of left femur, subsequent encounter for closed fracture with routine healing: Secondary | ICD-10-CM | POA: Diagnosis not present

## 2017-08-22 DIAGNOSIS — I2510 Atherosclerotic heart disease of native coronary artery without angina pectoris: Secondary | ICD-10-CM | POA: Diagnosis not present

## 2017-08-22 DIAGNOSIS — E1122 Type 2 diabetes mellitus with diabetic chronic kidney disease: Secondary | ICD-10-CM | POA: Diagnosis not present

## 2017-08-22 DIAGNOSIS — N183 Chronic kidney disease, stage 3 (moderate): Secondary | ICD-10-CM | POA: Diagnosis not present

## 2017-08-23 DIAGNOSIS — I5033 Acute on chronic diastolic (congestive) heart failure: Secondary | ICD-10-CM | POA: Diagnosis not present

## 2017-08-23 DIAGNOSIS — I2510 Atherosclerotic heart disease of native coronary artery without angina pectoris: Secondary | ICD-10-CM | POA: Diagnosis not present

## 2017-08-23 DIAGNOSIS — E1122 Type 2 diabetes mellitus with diabetic chronic kidney disease: Secondary | ICD-10-CM | POA: Diagnosis not present

## 2017-08-23 DIAGNOSIS — I130 Hypertensive heart and chronic kidney disease with heart failure and stage 1 through stage 4 chronic kidney disease, or unspecified chronic kidney disease: Secondary | ICD-10-CM | POA: Diagnosis not present

## 2017-08-23 DIAGNOSIS — S7292XD Unspecified fracture of left femur, subsequent encounter for closed fracture with routine healing: Secondary | ICD-10-CM | POA: Diagnosis not present

## 2017-08-23 DIAGNOSIS — N183 Chronic kidney disease, stage 3 (moderate): Secondary | ICD-10-CM | POA: Diagnosis not present

## 2017-08-25 DIAGNOSIS — S7292XD Unspecified fracture of left femur, subsequent encounter for closed fracture with routine healing: Secondary | ICD-10-CM | POA: Diagnosis not present

## 2017-08-25 DIAGNOSIS — R600 Localized edema: Secondary | ICD-10-CM | POA: Diagnosis not present

## 2017-08-25 DIAGNOSIS — I2510 Atherosclerotic heart disease of native coronary artery without angina pectoris: Secondary | ICD-10-CM | POA: Diagnosis not present

## 2017-08-25 DIAGNOSIS — E1122 Type 2 diabetes mellitus with diabetic chronic kidney disease: Secondary | ICD-10-CM | POA: Diagnosis not present

## 2017-08-25 DIAGNOSIS — I272 Pulmonary hypertension, unspecified: Secondary | ICD-10-CM | POA: Diagnosis not present

## 2017-08-25 DIAGNOSIS — I130 Hypertensive heart and chronic kidney disease with heart failure and stage 1 through stage 4 chronic kidney disease, or unspecified chronic kidney disease: Secondary | ICD-10-CM | POA: Diagnosis not present

## 2017-08-25 DIAGNOSIS — I482 Chronic atrial fibrillation: Secondary | ICD-10-CM | POA: Diagnosis not present

## 2017-08-25 DIAGNOSIS — I5032 Chronic diastolic (congestive) heart failure: Secondary | ICD-10-CM | POA: Diagnosis not present

## 2017-08-25 DIAGNOSIS — I5033 Acute on chronic diastolic (congestive) heart failure: Secondary | ICD-10-CM | POA: Diagnosis not present

## 2017-08-25 DIAGNOSIS — N183 Chronic kidney disease, stage 3 (moderate): Secondary | ICD-10-CM | POA: Diagnosis not present

## 2017-08-26 DIAGNOSIS — Z4509 Encounter for adjustment and management of other cardiac device: Secondary | ICD-10-CM | POA: Diagnosis not present

## 2017-08-26 DIAGNOSIS — Z95818 Presence of other cardiac implants and grafts: Secondary | ICD-10-CM | POA: Diagnosis not present

## 2017-08-26 DIAGNOSIS — R55 Syncope and collapse: Secondary | ICD-10-CM | POA: Diagnosis not present

## 2017-08-28 DIAGNOSIS — R300 Dysuria: Secondary | ICD-10-CM | POA: Diagnosis not present

## 2017-08-29 DIAGNOSIS — I5033 Acute on chronic diastolic (congestive) heart failure: Secondary | ICD-10-CM | POA: Diagnosis not present

## 2017-08-29 DIAGNOSIS — E1122 Type 2 diabetes mellitus with diabetic chronic kidney disease: Secondary | ICD-10-CM | POA: Diagnosis not present

## 2017-08-29 DIAGNOSIS — I130 Hypertensive heart and chronic kidney disease with heart failure and stage 1 through stage 4 chronic kidney disease, or unspecified chronic kidney disease: Secondary | ICD-10-CM | POA: Diagnosis not present

## 2017-08-29 DIAGNOSIS — N183 Chronic kidney disease, stage 3 (moderate): Secondary | ICD-10-CM | POA: Diagnosis not present

## 2017-08-29 DIAGNOSIS — I2510 Atherosclerotic heart disease of native coronary artery without angina pectoris: Secondary | ICD-10-CM | POA: Diagnosis not present

## 2017-08-29 DIAGNOSIS — S7292XD Unspecified fracture of left femur, subsequent encounter for closed fracture with routine healing: Secondary | ICD-10-CM | POA: Diagnosis not present

## 2017-08-31 DIAGNOSIS — R351 Nocturia: Secondary | ICD-10-CM | POA: Diagnosis not present

## 2017-08-31 DIAGNOSIS — N3941 Urge incontinence: Secondary | ICD-10-CM | POA: Diagnosis not present

## 2017-08-31 DIAGNOSIS — I5033 Acute on chronic diastolic (congestive) heart failure: Secondary | ICD-10-CM | POA: Diagnosis not present

## 2017-08-31 DIAGNOSIS — E1122 Type 2 diabetes mellitus with diabetic chronic kidney disease: Secondary | ICD-10-CM | POA: Diagnosis not present

## 2017-08-31 DIAGNOSIS — R3915 Urgency of urination: Secondary | ICD-10-CM | POA: Diagnosis not present

## 2017-08-31 DIAGNOSIS — I2510 Atherosclerotic heart disease of native coronary artery without angina pectoris: Secondary | ICD-10-CM | POA: Diagnosis not present

## 2017-08-31 DIAGNOSIS — N183 Chronic kidney disease, stage 3 (moderate): Secondary | ICD-10-CM | POA: Diagnosis not present

## 2017-08-31 DIAGNOSIS — I130 Hypertensive heart and chronic kidney disease with heart failure and stage 1 through stage 4 chronic kidney disease, or unspecified chronic kidney disease: Secondary | ICD-10-CM | POA: Diagnosis not present

## 2017-08-31 DIAGNOSIS — N39490 Overflow incontinence: Secondary | ICD-10-CM | POA: Diagnosis not present

## 2017-08-31 DIAGNOSIS — S7292XD Unspecified fracture of left femur, subsequent encounter for closed fracture with routine healing: Secondary | ICD-10-CM | POA: Diagnosis not present

## 2017-09-04 DIAGNOSIS — H401130 Primary open-angle glaucoma, bilateral, stage unspecified: Secondary | ICD-10-CM | POA: Diagnosis not present

## 2017-09-04 DIAGNOSIS — R600 Localized edema: Secondary | ICD-10-CM | POA: Diagnosis not present

## 2017-09-25 DIAGNOSIS — R55 Syncope and collapse: Secondary | ICD-10-CM | POA: Diagnosis not present

## 2017-09-25 DIAGNOSIS — R600 Localized edema: Secondary | ICD-10-CM | POA: Diagnosis not present

## 2017-09-25 DIAGNOSIS — I5032 Chronic diastolic (congestive) heart failure: Secondary | ICD-10-CM | POA: Diagnosis not present

## 2017-09-25 DIAGNOSIS — I272 Pulmonary hypertension, unspecified: Secondary | ICD-10-CM | POA: Diagnosis not present

## 2017-09-25 DIAGNOSIS — Z95818 Presence of other cardiac implants and grafts: Secondary | ICD-10-CM | POA: Diagnosis not present

## 2017-09-25 DIAGNOSIS — Z4509 Encounter for adjustment and management of other cardiac device: Secondary | ICD-10-CM | POA: Diagnosis not present

## 2017-09-27 ENCOUNTER — Encounter: Payer: Self-pay | Admitting: Internal Medicine

## 2017-10-02 DIAGNOSIS — I5033 Acute on chronic diastolic (congestive) heart failure: Secondary | ICD-10-CM | POA: Diagnosis not present

## 2017-10-03 DIAGNOSIS — I10 Essential (primary) hypertension: Secondary | ICD-10-CM | POA: Diagnosis not present

## 2017-10-03 DIAGNOSIS — H26492 Other secondary cataract, left eye: Secondary | ICD-10-CM | POA: Diagnosis not present

## 2017-10-03 DIAGNOSIS — H401132 Primary open-angle glaucoma, bilateral, moderate stage: Secondary | ICD-10-CM | POA: Diagnosis not present

## 2017-10-03 DIAGNOSIS — Z961 Presence of intraocular lens: Secondary | ICD-10-CM | POA: Diagnosis not present

## 2017-10-03 DIAGNOSIS — E119 Type 2 diabetes mellitus without complications: Secondary | ICD-10-CM | POA: Diagnosis not present

## 2017-10-04 ENCOUNTER — Encounter: Payer: Medicare Other | Admitting: Internal Medicine

## 2017-10-13 ENCOUNTER — Encounter (HOSPITAL_COMMUNITY): Payer: Self-pay | Admitting: Emergency Medicine

## 2017-10-13 ENCOUNTER — Emergency Department (HOSPITAL_COMMUNITY): Payer: Medicare Other

## 2017-10-13 ENCOUNTER — Other Ambulatory Visit: Payer: Self-pay

## 2017-10-13 ENCOUNTER — Inpatient Hospital Stay (HOSPITAL_COMMUNITY)
Admission: EM | Admit: 2017-10-13 | Discharge: 2017-10-16 | DRG: 698 | Disposition: A | Discharge status: Skilled Nursing Facility | Payer: Medicare Other | Attending: Family Medicine | Admitting: Family Medicine

## 2017-10-13 DIAGNOSIS — L02415 Cutaneous abscess of right lower limb: Secondary | ICD-10-CM | POA: Diagnosis present

## 2017-10-13 DIAGNOSIS — J9811 Atelectasis: Secondary | ICD-10-CM | POA: Diagnosis present

## 2017-10-13 DIAGNOSIS — M1990 Unspecified osteoarthritis, unspecified site: Secondary | ICD-10-CM | POA: Diagnosis present

## 2017-10-13 DIAGNOSIS — R531 Weakness: Secondary | ICD-10-CM | POA: Diagnosis not present

## 2017-10-13 DIAGNOSIS — E876 Hypokalemia: Secondary | ICD-10-CM | POA: Diagnosis not present

## 2017-10-13 DIAGNOSIS — I5033 Acute on chronic diastolic (congestive) heart failure: Secondary | ICD-10-CM | POA: Diagnosis not present

## 2017-10-13 DIAGNOSIS — L02416 Cutaneous abscess of left lower limb: Secondary | ICD-10-CM | POA: Diagnosis present

## 2017-10-13 DIAGNOSIS — I2510 Atherosclerotic heart disease of native coronary artery without angina pectoris: Secondary | ICD-10-CM | POA: Diagnosis present

## 2017-10-13 DIAGNOSIS — J90 Pleural effusion, not elsewhere classified: Secondary | ICD-10-CM | POA: Diagnosis not present

## 2017-10-13 DIAGNOSIS — Z87442 Personal history of urinary calculi: Secondary | ICD-10-CM

## 2017-10-13 DIAGNOSIS — N183 Chronic kidney disease, stage 3 (moderate): Secondary | ICD-10-CM | POA: Diagnosis present

## 2017-10-13 DIAGNOSIS — L03115 Cellulitis of right lower limb: Secondary | ICD-10-CM | POA: Diagnosis present

## 2017-10-13 DIAGNOSIS — Z823 Family history of stroke: Secondary | ICD-10-CM

## 2017-10-13 DIAGNOSIS — J811 Chronic pulmonary edema: Secondary | ICD-10-CM | POA: Diagnosis not present

## 2017-10-13 DIAGNOSIS — Z87891 Personal history of nicotine dependence: Secondary | ICD-10-CM

## 2017-10-13 DIAGNOSIS — I130 Hypertensive heart and chronic kidney disease with heart failure and stage 1 through stage 4 chronic kidney disease, or unspecified chronic kidney disease: Secondary | ICD-10-CM | POA: Diagnosis present

## 2017-10-13 DIAGNOSIS — N179 Acute kidney failure, unspecified: Secondary | ICD-10-CM | POA: Diagnosis present

## 2017-10-13 DIAGNOSIS — R278 Other lack of coordination: Secondary | ICD-10-CM | POA: Diagnosis not present

## 2017-10-13 DIAGNOSIS — Z79899 Other long term (current) drug therapy: Secondary | ICD-10-CM

## 2017-10-13 DIAGNOSIS — L03116 Cellulitis of left lower limb: Secondary | ICD-10-CM | POA: Diagnosis present

## 2017-10-13 DIAGNOSIS — Y846 Urinary catheterization as the cause of abnormal reaction of the patient, or of later complication, without mention of misadventure at the time of the procedure: Secondary | ICD-10-CM | POA: Diagnosis present

## 2017-10-13 DIAGNOSIS — W19XXXA Unspecified fall, initial encounter: Secondary | ICD-10-CM

## 2017-10-13 DIAGNOSIS — M6281 Muscle weakness (generalized): Secondary | ICD-10-CM | POA: Diagnosis not present

## 2017-10-13 DIAGNOSIS — T83518A Infection and inflammatory reaction due to other urinary catheter, initial encounter: Principal | ICD-10-CM | POA: Diagnosis present

## 2017-10-13 DIAGNOSIS — G9341 Metabolic encephalopathy: Secondary | ICD-10-CM | POA: Diagnosis not present

## 2017-10-13 DIAGNOSIS — I4891 Unspecified atrial fibrillation: Secondary | ICD-10-CM | POA: Diagnosis not present

## 2017-10-13 DIAGNOSIS — L0390 Cellulitis, unspecified: Secondary | ICD-10-CM | POA: Diagnosis not present

## 2017-10-13 DIAGNOSIS — I10 Essential (primary) hypertension: Secondary | ICD-10-CM | POA: Diagnosis present

## 2017-10-13 DIAGNOSIS — K219 Gastro-esophageal reflux disease without esophagitis: Secondary | ICD-10-CM | POA: Diagnosis present

## 2017-10-13 DIAGNOSIS — L03119 Cellulitis of unspecified part of limb: Secondary | ICD-10-CM | POA: Diagnosis not present

## 2017-10-13 DIAGNOSIS — Y92230 Patient room in hospital as the place of occurrence of the external cause: Secondary | ICD-10-CM | POA: Diagnosis not present

## 2017-10-13 DIAGNOSIS — N390 Urinary tract infection, site not specified: Secondary | ICD-10-CM | POA: Diagnosis present

## 2017-10-13 DIAGNOSIS — Z7982 Long term (current) use of aspirin: Secondary | ICD-10-CM

## 2017-10-13 DIAGNOSIS — G9340 Encephalopathy, unspecified: Secondary | ICD-10-CM | POA: Diagnosis not present

## 2017-10-13 DIAGNOSIS — E669 Obesity, unspecified: Secondary | ICD-10-CM | POA: Diagnosis present

## 2017-10-13 DIAGNOSIS — N401 Enlarged prostate with lower urinary tract symptoms: Secondary | ICD-10-CM | POA: Diagnosis present

## 2017-10-13 DIAGNOSIS — L02419 Cutaneous abscess of limb, unspecified: Secondary | ICD-10-CM | POA: Diagnosis present

## 2017-10-13 DIAGNOSIS — Z6831 Body mass index (BMI) 31.0-31.9, adult: Secondary | ICD-10-CM

## 2017-10-13 DIAGNOSIS — N529 Male erectile dysfunction, unspecified: Secondary | ICD-10-CM | POA: Diagnosis present

## 2017-10-13 DIAGNOSIS — I110 Hypertensive heart disease with heart failure: Secondary | ICD-10-CM | POA: Diagnosis not present

## 2017-10-13 DIAGNOSIS — D509 Iron deficiency anemia, unspecified: Secondary | ICD-10-CM | POA: Diagnosis present

## 2017-10-13 DIAGNOSIS — I2721 Secondary pulmonary arterial hypertension: Secondary | ICD-10-CM | POA: Diagnosis present

## 2017-10-13 DIAGNOSIS — I480 Paroxysmal atrial fibrillation: Secondary | ICD-10-CM | POA: Diagnosis present

## 2017-10-13 DIAGNOSIS — R404 Transient alteration of awareness: Secondary | ICD-10-CM | POA: Diagnosis not present

## 2017-10-13 DIAGNOSIS — Z800 Family history of malignant neoplasm of digestive organs: Secondary | ICD-10-CM

## 2017-10-13 DIAGNOSIS — K5900 Constipation, unspecified: Secondary | ICD-10-CM | POA: Diagnosis present

## 2017-10-13 DIAGNOSIS — R296 Repeated falls: Secondary | ICD-10-CM | POA: Diagnosis present

## 2017-10-13 DIAGNOSIS — R4182 Altered mental status, unspecified: Secondary | ICD-10-CM | POA: Diagnosis not present

## 2017-10-13 DIAGNOSIS — H409 Unspecified glaucoma: Secondary | ICD-10-CM | POA: Diagnosis present

## 2017-10-13 DIAGNOSIS — R0902 Hypoxemia: Secondary | ICD-10-CM

## 2017-10-13 DIAGNOSIS — R2689 Other abnormalities of gait and mobility: Secondary | ICD-10-CM | POA: Diagnosis not present

## 2017-10-13 DIAGNOSIS — E785 Hyperlipidemia, unspecified: Secondary | ICD-10-CM | POA: Diagnosis present

## 2017-10-13 DIAGNOSIS — H1132 Conjunctival hemorrhage, left eye: Secondary | ICD-10-CM | POA: Diagnosis present

## 2017-10-13 DIAGNOSIS — T502X5A Adverse effect of carbonic-anhydrase inhibitors, benzothiadiazides and other diuretics, initial encounter: Secondary | ICD-10-CM | POA: Diagnosis not present

## 2017-10-13 DIAGNOSIS — Z66 Do not resuscitate: Secondary | ICD-10-CM | POA: Diagnosis present

## 2017-10-13 DIAGNOSIS — I482 Chronic atrial fibrillation: Secondary | ICD-10-CM | POA: Diagnosis not present

## 2017-10-13 DIAGNOSIS — R972 Elevated prostate specific antigen [PSA]: Secondary | ICD-10-CM | POA: Diagnosis present

## 2017-10-13 DIAGNOSIS — I5032 Chronic diastolic (congestive) heart failure: Secondary | ICD-10-CM | POA: Diagnosis not present

## 2017-10-13 DIAGNOSIS — E1122 Type 2 diabetes mellitus with diabetic chronic kidney disease: Secondary | ICD-10-CM | POA: Diagnosis present

## 2017-10-13 DIAGNOSIS — I481 Persistent atrial fibrillation: Secondary | ICD-10-CM | POA: Diagnosis present

## 2017-10-13 DIAGNOSIS — Z9181 History of falling: Secondary | ICD-10-CM | POA: Diagnosis not present

## 2017-10-13 DIAGNOSIS — R1310 Dysphagia, unspecified: Secondary | ICD-10-CM | POA: Diagnosis present

## 2017-10-13 DIAGNOSIS — R4020 Unspecified coma: Secondary | ICD-10-CM | POA: Diagnosis not present

## 2017-10-13 DIAGNOSIS — N189 Chronic kidney disease, unspecified: Secondary | ICD-10-CM

## 2017-10-13 DIAGNOSIS — E119 Type 2 diabetes mellitus without complications: Secondary | ICD-10-CM | POA: Diagnosis not present

## 2017-10-13 LAB — COMPREHENSIVE METABOLIC PANEL
ALT: 13 U/L — ABNORMAL LOW (ref 17–63)
AST: 32 U/L (ref 15–41)
Albumin: 3.4 g/dL — ABNORMAL LOW (ref 3.5–5.0)
Alkaline Phosphatase: 106 U/L (ref 38–126)
Anion gap: 14 (ref 5–15)
BILIRUBIN TOTAL: 2 mg/dL — AB (ref 0.3–1.2)
BUN: 54 mg/dL — AB (ref 6–20)
CALCIUM: 9.1 mg/dL (ref 8.9–10.3)
CO2: 30 mmol/L (ref 22–32)
Chloride: 97 mmol/L — ABNORMAL LOW (ref 101–111)
Creatinine, Ser: 1.99 mg/dL — ABNORMAL HIGH (ref 0.61–1.24)
GFR calc Af Amer: 32 mL/min — ABNORMAL LOW (ref >60)
GFR, EST NON AFRICAN AMERICAN: 28 mL/min — AB (ref >60)
Glucose, Bld: 211 mg/dL — ABNORMAL HIGH (ref 65–99)
POTASSIUM: 3 mmol/L — AB (ref 3.5–5.1)
Sodium: 141 mmol/L (ref 135–145)
TOTAL PROTEIN: 6.9 g/dL (ref 6.5–8.1)

## 2017-10-13 LAB — CBC WITH DIFFERENTIAL/PLATELET
Basophils Absolute: 0 10*3/uL (ref 0.0–0.1)
Basophils Relative: 0 %
Eosinophils Absolute: 0 10*3/uL (ref 0.0–0.7)
Eosinophils Relative: 0 %
HCT: 38.8 % — ABNORMAL LOW (ref 39.0–52.0)
Hemoglobin: 12.7 g/dL — ABNORMAL LOW (ref 13.0–17.0)
Lymphocytes Relative: 10 %
Lymphs Abs: 0.9 10*3/uL (ref 0.7–4.0)
MCH: 29.5 pg (ref 26.0–34.0)
MCHC: 32.7 g/dL (ref 30.0–36.0)
MCV: 90 fL (ref 78.0–100.0)
Monocytes Absolute: 0.2 10*3/uL (ref 0.1–1.0)
Monocytes Relative: 3 %
Neutro Abs: 8.3 10*3/uL — ABNORMAL HIGH (ref 1.7–7.7)
Neutrophils Relative %: 87 %
Platelets: 220 10*3/uL (ref 150–400)
RBC: 4.31 MIL/uL (ref 4.22–5.81)
RDW: 16.3 % — ABNORMAL HIGH (ref 11.5–15.5)
WBC: 9.5 10*3/uL (ref 4.0–10.5)

## 2017-10-13 LAB — URINALYSIS, ROUTINE W REFLEX MICROSCOPIC
Bilirubin Urine: NEGATIVE
Glucose, UA: NEGATIVE mg/dL
Ketones, ur: NEGATIVE mg/dL
Nitrite: NEGATIVE
Protein, ur: 30 mg/dL — AB
Specific Gravity, Urine: 1.013 (ref 1.005–1.030)
Squamous Epithelial / HPF: NONE SEEN
pH: 5 (ref 5.0–8.0)

## 2017-10-13 LAB — I-STAT CG4 LACTIC ACID, ED
Lactic Acid, Venous: 3.22 mmol/L (ref 0.5–1.9)
Lactic Acid, Venous: 3.38 mmol/L (ref 0.5–1.9)

## 2017-10-13 LAB — I-STAT TROPONIN, ED: Troponin i, poc: 0.05 ng/mL (ref 0.00–0.08)

## 2017-10-13 LAB — GLUCOSE, CAPILLARY
GLUCOSE-CAPILLARY: 222 mg/dL — AB (ref 65–99)
Glucose-Capillary: 201 mg/dL — ABNORMAL HIGH (ref 65–99)

## 2017-10-13 LAB — CK: Total CK: 203 U/L (ref 49–397)

## 2017-10-13 LAB — BRAIN NATRIURETIC PEPTIDE: B Natriuretic Peptide: 417.9 pg/mL — ABNORMAL HIGH (ref 0.0–100.0)

## 2017-10-13 LAB — PROTIME-INR
INR: 1.22
Prothrombin Time: 15.3 seconds — ABNORMAL HIGH (ref 11.4–15.2)

## 2017-10-13 MED ORDER — GLIMEPIRIDE 4 MG PO TABS: 2.0000 mg | ORAL_TABLET | Freq: Every day | ORAL | Status: DC

## 2017-10-13 MED ORDER — ALBUTEROL SULFATE (2.5 MG/3ML) 0.083% IN NEBU: 2.5000 mg | INHALATION_SOLUTION | RESPIRATORY_TRACT | Status: DC | PRN

## 2017-10-13 MED ORDER — VANCOMYCIN HCL IN DEXTROSE 1-5 GM/200ML-% IV SOLN
1000.0000 mg | Freq: Once | INTRAVENOUS | Status: AC
  Administered 2017-10-13: 1000 mg via INTRAVENOUS
  Filled 2017-10-13: qty 200

## 2017-10-13 MED ORDER — TRAZODONE HCL 50 MG PO TABS: 50.0000 mg | ORAL_TABLET | Freq: Every evening | ORAL | Status: DC | PRN

## 2017-10-13 MED ORDER — METOLAZONE 2.5 MG PO TABS
2.5000 mg | ORAL_TABLET | ORAL | Status: DC
  Administered 2017-10-16: 2.5 mg via ORAL
  Filled 2017-10-13: qty 1

## 2017-10-13 MED ORDER — MIRABEGRON ER 25 MG PO TB24
50.0000 mg | ORAL_TABLET | Freq: Every day | ORAL | Status: DC
  Administered 2017-10-14 – 2017-10-16 (×3): 50 mg via ORAL
  Filled 2017-10-13 (×3): qty 2

## 2017-10-13 MED ORDER — TRAVOPROST (BAK FREE) 0.004 % OP SOLN
1.0000 [drp] | Freq: Every day | OPHTHALMIC | Status: DC
  Administered 2017-10-13 – 2017-10-15 (×3): 1 [drp] via OPHTHALMIC
  Filled 2017-10-13: qty 2.5

## 2017-10-13 MED ORDER — FUROSEMIDE 10 MG/ML IJ SOLN
40.0000 mg | Freq: Two times a day (BID) | INTRAMUSCULAR | Status: DC
  Administered 2017-10-13 – 2017-10-16 (×5): 40 mg via INTRAVENOUS
  Filled 2017-10-13 (×5): qty 4

## 2017-10-13 MED ORDER — PIPERACILLIN-TAZOBACTAM 3.375 G IVPB 30 MIN
3.3750 g | Freq: Once | INTRAVENOUS | Status: AC
  Administered 2017-10-13: 3.375 g via INTRAVENOUS
  Filled 2017-10-13: qty 50

## 2017-10-13 MED ORDER — POLYSACCHARIDE IRON COMPLEX 150 MG PO CAPS
150.0000 mg | ORAL_CAPSULE | Freq: Every day | ORAL | Status: DC
  Administered 2017-10-14 – 2017-10-16 (×3): 150 mg via ORAL
  Filled 2017-10-13 (×4): qty 1

## 2017-10-13 MED ORDER — POLYETHYLENE GLYCOL 3350 17 G PO PACK: 17.0000 g | PACK | Freq: Every day | ORAL | Status: DC | PRN

## 2017-10-13 MED ORDER — POTASSIUM CHLORIDE CRYS ER 20 MEQ PO TBCR
40.0000 meq | EXTENDED_RELEASE_TABLET | Freq: Every day | ORAL | Status: DC
  Administered 2017-10-13 – 2017-10-16 (×4): 40 meq via ORAL
  Filled 2017-10-13 (×4): qty 2

## 2017-10-13 MED ORDER — BRINZOLAMIDE 1 % OP SUSP
1.0000 [drp] | Freq: Every day | OPHTHALMIC | Status: DC
  Administered 2017-10-13 – 2017-10-15 (×3): 1 [drp] via OPHTHALMIC
  Filled 2017-10-13: qty 10

## 2017-10-13 MED ORDER — SODIUM CHLORIDE 0.9% FLUSH: 3.0000 mL | INTRAVENOUS | Status: DC | PRN

## 2017-10-13 MED ORDER — SENNA 8.6 MG PO TABS
1.0000 | ORAL_TABLET | Freq: Two times a day (BID) | ORAL | Status: DC
  Administered 2017-10-13 – 2017-10-16 (×5): 8.6 mg via ORAL
  Filled 2017-10-13 (×5): qty 1

## 2017-10-13 MED ORDER — SODIUM CHLORIDE 0.9 % IV SOLN: 250.0000 mL | INTRAVENOUS | Status: DC | PRN

## 2017-10-13 MED ORDER — ACETAMINOPHEN 325 MG PO TABS: 650.0000 mg | ORAL_TABLET | ORAL | Status: DC | PRN

## 2017-10-13 MED ORDER — FUROSEMIDE 10 MG/ML IJ SOLN
40.0000 mg | Freq: Once | INTRAMUSCULAR | Status: AC
  Administered 2017-10-13: 40 mg via INTRAVENOUS
  Filled 2017-10-13: qty 4

## 2017-10-13 MED ORDER — INSULIN ASPART 100 UNIT/ML ~~LOC~~ SOLN
0.0000 [IU] | Freq: Three times a day (TID) | SUBCUTANEOUS | Status: DC
  Administered 2017-10-14 (×2): 2 [IU] via SUBCUTANEOUS
  Administered 2017-10-15: 1 [IU] via SUBCUTANEOUS
  Administered 2017-10-15 (×3): 2 [IU] via SUBCUTANEOUS
  Administered 2017-10-16: 1 [IU] via SUBCUTANEOUS
  Administered 2017-10-16: 2 [IU] via SUBCUTANEOUS
  Administered 2017-10-16: 3 [IU] via SUBCUTANEOUS

## 2017-10-13 MED ORDER — ONDANSETRON HCL 4 MG PO TABS: 4.0000 mg | ORAL_TABLET | Freq: Four times a day (QID) | ORAL | Status: DC | PRN

## 2017-10-13 MED ORDER — FINASTERIDE 5 MG PO TABS
5.0000 mg | ORAL_TABLET | Freq: Every day | ORAL | Status: DC
  Administered 2017-10-14 – 2017-10-16 (×3): 5 mg via ORAL
  Filled 2017-10-13 (×3): qty 1

## 2017-10-13 MED ORDER — HYDROCODONE-ACETAMINOPHEN 5-325 MG PO TABS: 1.0000 | ORAL_TABLET | Freq: Four times a day (QID) | ORAL | Status: DC | PRN

## 2017-10-13 MED ORDER — TAMSULOSIN HCL 0.4 MG PO CAPS
0.4000 mg | ORAL_CAPSULE | Freq: Every day | ORAL | Status: DC
Start: 2017-10-13 — End: 2017-10-16
  Administered 2017-10-13 – 2017-10-16 (×4): 0.4 mg via ORAL
  Filled 2017-10-13 (×4): qty 1

## 2017-10-13 MED ORDER — APIXABAN 2.5 MG PO TABS: 2.5000 mg | ORAL_TABLET | Freq: Two times a day (BID) | ORAL | Status: DC

## 2017-10-13 MED ORDER — SODIUM CHLORIDE 0.9% FLUSH
3.0000 mL | Freq: Two times a day (BID) | INTRAVENOUS | Status: DC
  Administered 2017-10-13 – 2017-10-16 (×6): 3 mL via INTRAVENOUS

## 2017-10-13 MED ORDER — ONDANSETRON HCL 4 MG/2ML IJ SOLN: 4.0000 mg | Freq: Four times a day (QID) | INTRAMUSCULAR | Status: DC | PRN

## 2017-10-13 MED ORDER — ALLOPURINOL 100 MG PO TABS
100.0000 mg | ORAL_TABLET | Freq: Every day | ORAL | Status: DC
  Administered 2017-10-14 – 2017-10-16 (×3): 100 mg via ORAL
  Filled 2017-10-13 (×3): qty 1

## 2017-10-13 MED ORDER — DEXTROSE 5 % IV SOLN
1.0000 g | INTRAVENOUS | Status: DC
  Administered 2017-10-13 – 2017-10-15 (×3): 1 g via INTRAVENOUS
  Filled 2017-10-13 (×4): qty 10

## 2017-10-13 MED ORDER — DOCUSATE SODIUM 100 MG PO CAPS
100.0000 mg | ORAL_CAPSULE | Freq: Two times a day (BID) | ORAL | Status: DC
  Administered 2017-10-13 – 2017-10-16 (×5): 100 mg via ORAL
  Filled 2017-10-13 (×5): qty 1

## 2017-10-13 MED ORDER — SIMVASTATIN 40 MG PO TABS: 40.0000 mg | ORAL_TABLET | Freq: Every day | ORAL | Status: DC

## 2017-10-13 MED ORDER — NITROGLYCERIN 0.4 MG SL SUBL: 0.4000 mg | SUBLINGUAL_TABLET | SUBLINGUAL | Status: DC | PRN

## 2017-10-13 NOTE — Consult Note (Addendum)
Reason for Consult: Found unresponsive Referring Physician: [facility]  FRISCO CORDTS is an 83 y.o. male.  HPI:   83 year old patient Caucasian male with chronic diastolic heart failure, pulmonary hypertensio, persistent Afib with slow ventricular response, CAD, hypertension, DM, CKD 2, hyperlipidemia, syncopal episode leading to left femur intertrochanteric fracture s/p nail procedure 06/21/2017.   Patient was found unresponsive by his caretaker at home today and brought to [facility] ED. No collateral history available. HWorkup so far shows elevated lactic acid, elevated but lowe than prior BNP, mild atelectesis, possible consolidation and UTI. There was no alert on his loop recorder today.  Past few weeks, patient has had increasing weight in her leg swelling, and we have been trying to titrate his diuretic regimen as well as potassium supplement.  On 10/04/2017, I had a phone conversation with patient's nurse and had asked them to increase his potassium supplement to 40 mq.E  Twice daily.His daughter tells me that he had a fall a week ago as we,, but refused to go to the hospital.    Past Medical History:  Diagnosis Date  . BPH with elevated PSA   . Chest pain   . Chronic kidney disease, stage III (moderate) (HCC)   . DM (diabetes mellitus), type 2 with renal complications ([place])   . ED (erectile dysfunction)   . Glaucoma   . Hyperlipidemia   . Hypertension   . Microalbuminuria   . Microalbuminuria 2013  . Obesity   . Osteoarthritis   . Prostatic hypertrophy, benign, with obstruction   . Renal calculus   . Sick sinus syndrome [facility])     Past Surgical History:  Procedure Laterality Date  . INTRAMEDULLARY (IM) NAIL INTERTROCHANTERIC Left 06/21/2017   Procedure: INTRAMEDULLARY (IM) NAIL INTERTROCHANTRIC Left Leg;  Surgeon: Renette Butters, MD;  Location: [facility];  Service: Orthopedics;  Laterality: Left;  . INTRAVASCULAR PRESSURE WIRE/FFR STUDY N/A 04/18/2017   Procedure:  Intravascular Pressure Wire/FFR Study;  Surgeon: Adrian Prows, MD;  Location: [place] [facility];  Service: Cardiovascular;  Laterality: N/A;  Prox LAD  . KNEE SURGERY    . LOOP RECORDER INSERTION N/A 06/27/2017   Procedure: LOOP RECORDER INSERTION;  Surgeon: Thompson Grayer, MD;  Location: [place] [facility];  Service: Cardiovascular;  Laterality: N/A;  . REPLACEMENT TOTAL KNEE  02/26/01    Family History  Problem Relation Age of Onset  . Cancer Paternal Grandfather        STOMACH  . Cancer Sister        MALE CANCER  . Cancer - Colon Sister   . Stroke Father   . Healthy Child   . Healthy Child   . Other Child        BRAIN DAMAGE AT BIRTH    Social History:  reports that he has quit smoking. His smoking use included cigarettes. he has never used smokeless tobacco. He reports that he does not drink alcohol or use drugs.  Allergies: No Known Allergies  Medications: I have reviewed the patient's current medications.  Results for orders placed or performed during the hospital encounter of 10/13/17 (from the past 48 hour(s))  CBC with Differential     Status: Abnormal   Collection Time: 10/13/17  9:25 AM  Result Value Ref Range   WBC 9.5 4.0 - 10.5 K/uL   RBC 4.31 4.22 - 5.81 MIL/uL   Hemoglobin 12.7 (L) 13.0 - 17.0 g/dL   HCT 38.8 (L) 39.0 - 52.0 %  MCV 90.0 78.0 - 100.0 fL   MCH 29.5 26.0 - 34.0 pg   MCHC 32.7 30.0 - 36.0 g/dL   RDW 16.3 (H) 11.5 - 15.5 %   Platelets 220 150 - 400 K/uL   Neutrophils Relative % 87 %   Neutro Abs 8.3 (H) 1.7 - 7.7 K/uL   Lymphocytes Relative 10 %   Lymphs Abs 0.9 0.7 - 4.0 K/uL   Monocytes Relative 3 %   Monocytes Absolute 0.2 0.1 - 1.0 K/uL   Eosinophils Relative 0 %   Eosinophils Absolute 0.0 0.0 - 0.7 K/uL   Basophils Relative 0 %   Basophils Absolute 0.0 0.0 - 0.1 K/uL  Protime-INR     Status: Abnormal   Collection Time: 10/13/17  9:25 AM  Result Value Ref Range   Prothrombin Time 15.3 (H) 11.4 - 15.2 seconds   INR 1.22   I-Stat CG4  Lactic Acid, ED     Status: Abnormal   Collection Time: 10/13/17  9:40 AM  Result Value Ref Range   Lactic Acid, Venous 3.22 (HH) 0.5 - 1.9 mmol/L   Comment NOTIFIED PHYSICIAN   I-Stat Troponin, ED (not at [facility])     Status: None   Collection Time: 10/13/17 10:38 AM  Result Value Ref Range   Troponin i, poc 0.05 0.00 - 0.08 ng/mL   Comment 3            Comment: Due to the release kinetics of cTnI, a negative result within the first hours of the onset of symptoms does not rule out myocardial infarction with certainty. If myocardial infarction is still suspected, repeat the test at appropriate intervals.   Urinalysis, Routine w reflex microscopic     Status: Abnormal   Collection Time: 10/13/17 10:52 AM  Result Value Ref Range   Color, Urine YELLOW YELLOW   APPearance HAZY (A) CLEAR   Specific Gravity, Urine 1.013 1.005 - 1.030   pH 5.0 5.0 - 8.0   Glucose, UA NEGATIVE NEGATIVE mg/dL   Hgb urine dipstick LARGE (A) NEGATIVE   Bilirubin Urine NEGATIVE NEGATIVE   Ketones, ur NEGATIVE NEGATIVE mg/dL   Protein, ur 30 (A) NEGATIVE mg/dL   Nitrite NEGATIVE NEGATIVE   Leukocytes, UA LARGE (A) NEGATIVE   RBC / HPF TOO NUMEROUS TO COUNT 0 - 5 RBC/hpf   WBC, UA TOO NUMEROUS TO COUNT 0 - 5 WBC/hpf   Bacteria, UA MANY (A) NONE SEEN   Squamous Epithelial / LPF NONE SEEN NONE SEEN   Mucus PRESENT   CK     Status: None   Collection Time: 10/13/17 10:52 AM  Result Value Ref Range   Total CK 203 49 - 397 U/L  Comprehensive metabolic panel     Status: Abnormal   Collection Time: 10/13/17 10:52 AM  Result Value Ref Range   Sodium 141 135 - 145 mmol/L   Potassium 3.0 (L) 3.5 - 5.1 mmol/L   Chloride 97 (L) 101 - 111 mmol/L   CO2 30 22 - 32 mmol/L   Glucose, Bld 211 (H) 65 - 99 mg/dL   BUN 54 (H) 6 - 20 mg/dL   Creatinine, Ser 1.99 (H) 0.61 - 1.24 mg/dL   Calcium 9.1 8.9 - 10.3 mg/dL   Total Protein 6.9 6.5 - 8.1 g/dL   Albumin 3.4 (L) 3.5 - 5.0 g/dL   AST 32 15 - 41 U/L   ALT 13 (L) 17 -  63 U/L   Alkaline Phosphatase 106 38 - 126  U/L   Total Bilirubin 2.0 (H) 0.3 - 1.2 mg/dL   GFR calc non Af Amer 28 (L) >60 mL/min   GFR calc Af Amer 32 (L) >60 mL/min    Comment: (NOTE) The eGFR has been calculated using the CKD EPI equation. This calculation has not been validated in all clinical situations. eGFR's persistently <60 mL/min signify possible Chronic Kidney Disease.    Anion gap 14 5 - 15    Dg Chest Portable 1 View  Result Date: 10/13/2017 CLINICAL DATA:  Patient found on the floor with decreased level of consciousness. Wheezing. EXAM: PORTABLE CHEST 1 VIEW COMPARISON:  07/24/2017 FINDINGS: Cardiomediastinal silhouette is normal. Mediastinal contours appear intact. Calcific atherosclerotic disease of the aorta. There is no evidence of pneumothorax. Low lung volumes. Prominence of the interstitial markings. Left pleural effusion. Left lower lobe airspace consolidation versus atelectasis. Osseous structures are without acute abnormality. Soft tissues are grossly normal. IMPRESSION: Low lung volumes with mild interstitial pulmonary edema. Left pleural effusion with left lower lobe atelectasis versus airspace consolidation. Electronically Signed   By: Fidela Salisbury M.D.   On: 10/13/2017 09:48    Echocardiogram 06/22/2017 Study Conclusions  - Left ventricle: The cavity size was mildly reduced. There was   moderate concentric hypertrophy. The estimated ejection fraction   was in the range of 55% to 60%. The study is not technically   sufficient to allow evaluation of LV diastolic function. - Aortic valve: Mildly calcified annulus. Trileaflet; moderately   calcified leaflets. There was mild stenosis. Valve area (VTI):   1.5 cm^2. Valve area (Vmax): 1.29 cm^2. Valve area (Vmean): 1.26   cm^2. - Mitral valve: Moderately calcified annulus. Valve area by   continuity equation (using LVOT flow): 1.7 cm^2. - Left atrium: The atrium was moderately dilated. - Right ventricle:  The cavity size was mildly dilated. Wall   thickness was normal. - Right atrium: The atrium was moderately dilated. - Tricuspid valve: There was moderate regurgitation. - Pulmonary arteries: PA peak pressure: 43 mm Hg (S).  Impressions:  - The right ventricular systolic pressure was increased consistent   with moderate pulmonary hypertension.   Review of Systems  Unable to perform ROS: Other  Constitutional: Negative for malaise/fatigue.  HENT: Positive for hearing loss (Mild. Baseline).   Eyes: Negative for pain and discharge.  Respiratory: Positive for shortness of breath (Improved).   Cardiovascular: Positive for leg swelling (Improved). Negative for chest pain.  Gastrointestinal: Negative for blood in stool.  Genitourinary:       Urinary hesitancy/ Foley catheter in place  Musculoskeletal: Positive for falls and joint pain.  Skin:       B/l leg erythema   Neurological: Positive for loss of consciousness (On admission, now back to baseline). Negative for dizziness.       Delirium limiting detailed ROS. Patient oriented to self and place only. Able to respond with words  Endo/Heme/Allergies: Does not bruise/bleed easily.  Psychiatric/Behavioral: Positive for memory loss (Forgetful of medications).  All other systems reviewed and are negative.  Blood pressure 117/68, pulse 71, temperature (!) 97.5 F (36.4 C), resp. rate 17, weight 93.4 kg (206 lb), SpO2 96 %. Physical Exam  Nursing note and vitals reviewed. Constitutional: He is oriented to person, place, and time. He appears well-developed.  HENT:  Head: Normocephalic.  Eyes: Pupils are equal, round, and reactive to light.  Left subconjunctival hemorrhage  Neck: Neck supple. JVD present.  Cardiovascular: Normal rate.  Murmur (3/6 holosystolic murmur RLSB) heard. Irregularly  irregular   Respiratory: Effort normal. He has no wheezes. He has no rales.  B/l coarse breath sounds   GI: Soft. Bowel sounds are normal.  He exhibits no distension. There is no tenderness.  Genitourinary:  Genitourinary Comments: Foley catheter in place. Scrotal edema    Musculoskeletal: Normal range of motion. He exhibits edema (1+).  Neurological: He is alert and oriented to person, place, and time. No cranial nerve deficit.  Skin: Skin is warm and dry.     Assessment:  83 year old patient Caucasian male with chronic diastolic heart failure, pulmonary hypertensio, persistent Afib with slow ventricular response, CAD, hypertension, DM, CKD 2, hyperlipidemia, syncopal episode leading to left femur intertrochanteric fracture s/p nail procedure 06/21/2017.  Admitted with confusion, found to have UTI and metabolic encephalopathy  Metabolic encephalopathy, hypokalemia: Likely precipitated by UTI. Management per the primary team. Unfortunately, Foley catheter is constant source of infection. He has baseline prostate enlargement. Consider urology input.   Chronic diastolic heart failure, pulmonary hypertension, Grp I/II: No significant decompensation, compared to baseline. Continue IV lasix 40 mg bid, metolazone 2.5 mg every other day On Adcirca 40 mg once daily at home. Will resume.  Peristent Afib with slow ventricular rate Stable. CHADVASC score 5, high risk, 6.7% Not on anticoagulation due to frequent falls No events on loop recorder  CAD: Known chronic RCA occlusion. Mild troponin elevation, likely supply demand mismatch.Conrtinue statin. Reasonable to hold Aspirin while on eliquis.    Discussed goals of care with patient and daughter Fannie Knee. He is DNR. Goal will be to minimize recurrent hospitalizations. I have encouraged them to consider 24X7 nursing care. Case managment will be involved.   Gennell How J Jaleen Grupp 10/13/2017, 11:32 AM   [place], MD [facility] [place] Pager: [phone] Office: [phone] If no answer Cell [phone]

## 2017-10-13 NOTE — ED Notes (Signed)
ED Provider at bedside. 

## 2017-10-13 NOTE — ED Notes (Signed)
Dr. Myrtie SomanWarden (Cardiology) is interrogating the patients implanted loop recorder.   [phone]

## 2017-10-13 NOTE — ED Notes (Signed)
Date and time results received: 10/13/17 12:08 PM  (use smartphrase ".now" to insert current time)  Test:  Lactic Acid, Venous    Component Value Date/Time   LATICACIDVEN 3.38 [facility](HH) 10/13/2017 1202    Name of Provider Notified: Nilda RiggsDana Lui  No new orders received

## 2017-10-13 NOTE — ED Notes (Signed)
Patient transported to CT 

## 2017-10-13 NOTE — Progress Notes (Signed)
No alerts on patient's loop recorder. Discussed with ED physician. Will see the patient later today.  Elder NegusManish J Bertrice Leder, MD [facility] [place] Pager: [phone] Office: [phone] If no answer Cell [phone]

## 2017-10-13 NOTE — ED Notes (Signed)
Attempting report

## 2017-10-13 NOTE — H&P (Addendum)
Patient Demographics:    [name] [name], is a 83 y.o. male  MRN: [id number]   DOB - 04/05/1925  Admit Date - 10/13/2017  Outpatient Primary MD for the patient is Tisovec, Adelfa Koh, MD   Assessment & Plan:    Principal Problem:   Acute metabolic encephalopathy/UTI/AKI/Cellulitis Active Problems:   Hypertension   Acute on chronic diastolic heart failure (HCC)   Cellulitis and abscess of Bil leg/Rt > Lt   UTI (urinary tract infection)   Acute-on-chronic kidney injury (HCC)   Encephalopathy    1)Acute metabolic encephalopathy suspect secondary to cellulitis and UTI-IV Rocephin as ordered pending cultures.  No significant fevers, no leukocytosis, no tachycardia.  Patient does not meet sepsis criteria, evident lactic acid most likely secondary to patient being down for more than 12 hours, rather than due to frank sepsis  2)HFpEF-patient appears to have acute on chronic diastolic CHF exacerbation, patient has increasing shortness of breath and increasing work of breathing, , chest x-ray suggested pulmonary edema and pleural effusions, Patient wt is up to 206 pounds from 163 pounds, was given Lasix for diuresis, last known EF based on echo from August 2018 was 55-60%, IV diuresis as ordered daily with fluid input and output monitoring,  ED provider discussed case with patient's cardiologist, official cardiology consult pending  3)AKI on CKD-creatinine is up to 1.9, avoid nephrotoxic agents, monitor renal function closely with diuresis consider nephrology consult if renal function worsens further  4)Social/Ethics-plan of care and advanced directive discussed with patient and his daughter and son-in-law at bedside (Mr and Mrs Jacqulyn Bath), patient is a DNR  5)Disposition-she lives alone, caregiver/CNA checks on him during  the day but not at night, patient has recurrent falls, get physical therapy evaluation, get social worker consult patient may need to be placed at the skilled nursing facility for rehab.  Patient previously declined placement to SNF in the past  6)DM-continue Amaryl with breakfast, Use Novolog/Humalog Sliding scale insulin with Accu-Cheks/Fingersticks as ordered   7)BPH with LUTS-continue chronic indwelling failed Foley, please change Foley prior to discharge after treatment for UTI , continue finasteride and Flomax  With History of - Reviewed by me  Past Medical History:  Diagnosis Date  . BPH with elevated PSA   . Chest pain   . Chronic kidney disease, stage III (moderate) (HCC)   . DM (diabetes mellitus), type 2 with renal complications (HCC)   . ED (erectile dysfunction)   . Glaucoma   . Hyperlipidemia   . Hypertension   . Microalbuminuria   . Microalbuminuria 2013  . Obesity   . Osteoarthritis   . Prostatic hypertrophy, benign, with obstruction   . Renal calculus   . Sick sinus syndrome [facility])       Past Surgical History:  Procedure Laterality Date  . INTRAMEDULLARY (IM) NAIL INTERTROCHANTERIC Left 06/21/2017   Procedure: INTRAMEDULLARY (IM) NAIL INTERTROCHANTRIC Left Leg;  Surgeon: Sheral Apley, MD;  Location: [facility] OR;  Service: Orthopedics;  Laterality: Left;  . INTRAVASCULAR PRESSURE WIRE/FFR STUDY N/A 04/18/2017   Procedure: Intravascular Pressure Wire/FFR Study;  Surgeon: Yates DecampGanji, Jay, MD;  [facility];  Service: Cardiovascular;  Laterality: N/A;  Prox LAD  . KNEE SURGERY    . LOOP RECORDER INSERTION N/A 06/27/2017   Procedure: LOOP RECORDER INSERTION;  Surgeon: Hillis RangeAllred, James, MD;  Location: [facility] INVASIVE CV LAB;  Service: Cardiovascular;  Laterality: N/A;  . REPLACEMENT TOTAL KNEE  02/26/01     Chief Complaint  Patient presents with  . Fall  . Shortness of Breath      HPI:    [name] ManchesterVernon Broeker  is a 83 y.o. male with past medical history relevant  for, status post [employer] loop recorder placement, stage III CKD, urinary retention with chronic indwelling Foley diabetes mellitus, chronic diastolic CHF hypertension who presents to the ED after being found on the ground status post fall. 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  patient was last seen normal yesterday morning , yesterday evening around 4:00 pm there was some mild confusion when the CNA came back into the house this morning found patient on the floor with his pants off and  with double underwear on ED provider discussed case with cardiologist,  Coral ElserManish J Patwardhan (cardiology) reviewed her loop recorder info without significant abnormalities noted  In ED patient has increasing shortness of breath and increasing work of breathing, , chest x-ray suggested pulmonary edema and pleural effusions, Patient wt is up to 206 pounds from 163 pounds, was given Lasix for diuresis, last known EF based on echo from August 2018 was 55-60%  Patient had no productive cough, no vomiting, he was apparently incontinent of formed stool.    Review of systems:    In addition to the HPI above,   A full 12 point Review of 10 Systems was done, except as stated above, all other Review of 10 Systems were negative.    Social History:  Reviewed by me    Social History   Tobacco Use  . Smoking status: Former Smoker    Types: Cigarettes  . Smokeless tobacco: Never Used  Substance Use Topics  . Alcohol use: No     Family History :  Reviewed by me    Family History  Problem Relation Age of Onset  . Cancer Paternal Grandfather        STOMACH  . Cancer Sister        MALE CANCER  . Cancer - Colon Sister   . Stroke Father   . Healthy Child   . Healthy Child   . Other Child        BRAIN DAMAGE AT BIRTH     Home Medications:   Prior to Admission medications   Medication  Sig Start Date End Date Taking? Authorizing Provider  allopurinol (ZYLOPRIM) 100 MG tablet Take 100 mg by mouth daily.   Yes [provider]  aspirin EC 325 MG tablet Take 325 mg by mouth daily.   Yes [provider]  brinzolamide (AZOPT) 1 % ophthalmic suspension Place 1 drop into both eyes at bedtime.    Yes [provider]  finasteride (PROSCAR) 5 MG tablet Take 5 mg by mouth daily.   Yes [provider]  furosemide (LASIX) 40 MG tablet Take 80-120 mg by mouth See admin instructions. 120mg  in the am and  80mg  in the afternoon   Yes [provider]  metolazone (ZAROXOLYN) 2.5 MG tablet Take 2.5 mg by mouth every other day. Filled 09-20-17   Yes [provider]  MYRBETRIQ 50 MG TB24 tablet Take 50 mg by mouth daily. 11/27/15  Yes [provider]  potassium chloride SA (K-DUR,KLOR-CON) 20 MEQ tablet Take 40 mEq by mouth 2 (two) times daily.    Yes [provider]  RAPAFLO 8 MG CAPS capsule Take 8 mg by mouth daily. 09/15/15  Yes [provider]  tadalafil (CIALIS) 20 MG tablet Take 40 mg by mouth daily.    Yes [provider]  travoprost, benzalkonium, (TRAVATAN) 0.004 % ophthalmic solution Place 1 drop into both eyes at bedtime.   Yes [provider]     Allergies:    No Known Allergies   Physical Exam:   Vitals  Blood pressure (!) 101/47, pulse 64, temperature 98.7 F (37.1 C), temperature source Oral, resp. rate 18, weight 93.4 kg (205 lb 16 oz), SpO2 98 %.  Physical Examination: General appearance -confused, disoriented, lethargic Mental status -intermittent confusion persist Eyes - sclera anicteric, redness of the left conjunctiva noted Neck - supple, no JVD elevation , Chest -diminished breath sounds bilaterally, scattered rales in bases Heart - S1 and S2 normal,  Abdomen - soft, nontender, nondistended, no masses or organomegaly Neurological -confused, generalized weakness without  new focal deficits  extremities -  intact peripheral pulses , restricted range of motion left hip due to prior fracture, limited range of motion right shoulder due to prior injury Skin -significant erythema warmth, swelling  of both lower extremities without any open wounds or drainage, consistent with cellulitis, right lower extremity worse than left GU-Foley without hematuria   Data Review:    CBC Recent Labs  Lab 10/13/17 0925  WBC 9.5  HGB 12.7*  HCT 38.8*  PLT 220  MCV 90.0  MCH 29.5  MCHC 32.7  RDW 16.3*  LYMPHSABS 0.9  MONOABS 0.2  EOSABS 0.0  BASOSABS 0.0   ------------------------------------------------------------------------------------------------------------------  Chemistries  Recent Labs  Lab 10/13/17 1052  NA 141  K 3.0*  CL 97*  CO2 30  GLUCOSE 211*  BUN 54*  CREATININE 1.99*  CALCIUM 9.1  AST 32  ALT 13*  ALKPHOS 106  BILITOT 2.0*   ------------------------------------------------------------------------------------------------------------------ estimated creatinine clearance is 27.4 mL/min (A) (by C-G formula based on SCr of 1.99 mg/dL (H)). ------------------------------------------------------------------------------------------------------------------ No results for input(s): TSH, T4TOTAL, T3FREE, THYROIDAB in the last 72 hours.  Invalid input(s): FREET3   Coagulation profile Recent Labs  Lab 10/13/17 0925  INR 1.22   ------------------------------------------------------------------------------------------------------------------- No results for input(s): DDIMER in the last 72 hours. -------------------------------------------------------------------------------------------------------------------  Cardiac Enzymes No results for input(s): CKMB, TROPONINI, MYOGLOBIN in the last 168 hours.  Invalid input(s): CK ------------------------------------------------------------------------------------------------------------------      Component Value Date/Time   BNP 417.9 (H) 10/13/2017 0925     ---------------------------------------------------------------------------------------------------------------  Urinalysis    Component Value Date/Time   COLORURINE YELLOW 10/13/2017 1052   APPEARANCEUR HAZY (A) 10/13/2017 1052   LABSPEC 1.013 10/13/2017 1052   PHURINE 5.0 10/13/2017 1052   GLUCOSEU NEGATIVE 10/13/2017 1052   HGBUR LARGE (A) 10/13/2017 1052   BILIRUBINUR NEGATIVE 10/13/2017 1052   KETONESUR NEGATIVE 10/13/2017 1052   PROTEINUR 30 (A) 10/13/2017 1052   NITRITE NEGATIVE 10/13/2017 1052   LEUKOCYTESUR LARGE (A) 10/13/2017 1052    ----------------------------------------------------------------------------------------------------------------   Imaging Results:    Ct Head Wo Contrast  Result Date: 10/13/2017 CLINICAL  DATA:  Altered mental status. EXAM: CT HEAD WITHOUT CONTRAST TECHNIQUE: Contiguous axial images were obtained from the base of the skull through the vertex without intravenous contrast. COMPARISON:  06/20/2017 FINDINGS: Brain: Stable age related cerebral atrophy, ventriculomegaly and periventricular white matter disease. No extra-axial fluid collections are identified. No CT findings for acute hemispheric infarction or intracranial hemorrhage. No mass lesions. The brainstem and cerebellum are normal. Vascular: Stable dense vascular calcifications. No hyperdense vessel or unexpected calcification. Skull: No fracture or bone lesion. Sinuses/Orbits: The paranasal sinuses and mastoid air cells are clear except for minimal areas of mucoperiosteal thickening. The globes are intact. Other: No scalp lesion or hematoma. IMPRESSION: Stable age related cerebral atrophy, ventriculomegaly and periventricular white matter disease. No acute intracranial findings. Electronically Signed   By: Rudie Meyer M.D.   On: 10/13/2017 13:10   Dg Chest Portable 1 View  Result Date: 10/13/2017 CLINICAL DATA:  Patient  found on the floor with decreased level of consciousness. Wheezing. EXAM: PORTABLE CHEST 1 VIEW COMPARISON:  07/24/2017 FINDINGS: Cardiomediastinal silhouette is normal. Mediastinal contours appear intact. Calcific atherosclerotic disease of the aorta. There is no evidence of pneumothorax. Low lung volumes. Prominence of the interstitial markings. Left pleural effusion. Left lower lobe airspace consolidation versus atelectasis. Osseous structures are without acute abnormality. Soft tissues are grossly normal. IMPRESSION: Low lung volumes with mild interstitial pulmonary edema. Left pleural effusion with left lower lobe atelectasis versus airspace consolidation. Electronically Signed   By: Ted Mcalpine M.D.   On: 10/13/2017 09:48    Radiological Exams on Admission: Ct Head Wo Contrast  Result Date: 10/13/2017 CLINICAL DATA:  Altered mental status. EXAM: CT HEAD WITHOUT CONTRAST TECHNIQUE: Contiguous axial images were obtained from the base of the skull through the vertex without intravenous contrast. COMPARISON:  06/20/2017 FINDINGS: Brain: Stable age related cerebral atrophy, ventriculomegaly and periventricular white matter disease. No extra-axial fluid collections are identified. No CT findings for acute hemispheric infarction or intracranial hemorrhage. No mass lesions. The brainstem and cerebellum are normal. Vascular: Stable dense vascular calcifications. No hyperdense vessel or unexpected calcification. Skull: No fracture or bone lesion. Sinuses/Orbits: The paranasal sinuses and mastoid air cells are clear except for minimal areas of mucoperiosteal thickening. The globes are intact. Other: No scalp lesion or hematoma. IMPRESSION: Stable age related cerebral atrophy, ventriculomegaly and periventricular white matter disease. No acute intracranial findings. Electronically Signed   By: Rudie Meyer M.D.   On: 10/13/2017 13:10   Dg Chest Portable 1 View  Result Date: 10/13/2017 CLINICAL DATA:   Patient found on the floor with decreased level of consciousness. Wheezing. EXAM: PORTABLE CHEST 1 VIEW COMPARISON:  07/24/2017 FINDINGS: Cardiomediastinal silhouette is normal. Mediastinal contours appear intact. Calcific atherosclerotic disease of the aorta. There is no evidence of pneumothorax. Low lung volumes. Prominence of the interstitial markings. Left pleural effusion. Left lower lobe airspace consolidation versus atelectasis. Osseous structures are without acute abnormality. Soft tissues are grossly normal. IMPRESSION: Low lung volumes with mild interstitial pulmonary edema. Left pleural effusion with left lower lobe atelectasis versus airspace consolidation. Electronically Signed   By: Ted Mcalpine M.D.   On: 10/13/2017 09:48   DVT Prophylaxis -SCD  AM Labs Ordered, also please review Full Orders  Family Communication: Admission, patients condition and plan of care including tests being ordered have been discussed with the patient and daughter/son in law who indicate understanding and agree with the plan   Code Status - Full Code  Likely DC to  SNF  Condition  stable  Shon Haleourage Harpreet Pompey M.D on 10/13/2017 at 8:24 PM   Between 7am to 7pm - Pager - [phone] After 7pm go to [email or web address] - password [email or web address]  [facility] - Office  [phone]  Voice Recognition Reubin Milan/Dragon dictation system was used to create this note, attempts have been made to correct errors. Please contact the author with questions and/or clarifications.

## 2017-10-13 NOTE — ED Provider Notes (Signed)
[facility] Provider Note   CSN: [id number] Arrival date & time: 10/13/17  0908     History   Chief Complaint Chief Complaint  Patient presents with  . Fall  . Shortness of Breath    HPI [name] DockVernon E [name] is a 83 y.o. male.  HPI 83 year old male who presents with fall.  He is poor historian.  History is primarily obtained from patient's home health aide.  He has a history of sick sinus syndrome, with [employer] loop recorder, diabetes, stage III chronic kidney disease, chronic diastolic heart failure, hypertension and hyperlipidemia.  Patient is brought in from home.  His home health aide reports that he was last seen at 4 to 4:30 PM yesterday evening.  This morning they found him on the ground.  They state that he was a little confused yesterday morning, but throughout the day was getting better.  This morning, he was found without his pants on, his underwear on, and another pair of underwear half on.  He does not recall what happened.  He does not recall the last thing he remembers.  Denies any complaints.  His health aide reports that this morning they noted slight red tinge to his urine, and he has Foley catheter that was placed 1 month ago to help with diuresis.  Since October he has gained a significant amount of fluid weight from 163 pounds to 206 pounds.  EMS transferred patient to ED, and patient is on oxygen for increased work of breathing and audible wheezing. Denies fever, chills, n/v/d, abdominal pain, cough, dyspnea, or chest pain    Past Medical History:  Diagnosis Date  . BPH with elevated PSA   . Chest pain   . Chronic kidney disease, stage III (moderate) (HCC)   . DM (diabetes mellitus), type 2 with renal complications (HCC)   . ED (erectile dysfunction)   . Glaucoma   . Hyperlipidemia   . Hypertension   . Microalbuminuria   . Microalbuminuria 2013  . Obesity   . Osteoarthritis   . Prostatic hypertrophy, benign, with  obstruction   . Renal calculus   . Sick sinus syndrome [facility](HCC)     Patient Active Problem List   Diagnosis Date Noted  . Hypokalemia 07/24/2017  . Closed intertrochanteric fracture of left femur (HCC)   . Bradycardia   . Closed left hip fracture, initial encounter (HCC) 06/21/2017  . Hypotension 06/21/2017  . Syncope 06/21/2017  . Iron deficiency anemia 06/21/2017  . Elevated troponin 06/21/2017  . Closed fracture of left hip (HCC)   . Acute on chronic diastolic heart failure (HCC) 12/01/2016  . Atrial fibrillation (HCC) 02/24/2016  . Bilateral leg weakness 09/17/2015  . Chest pain   . DM (diabetes mellitus), type 2 with renal complications (HCC)   . Hypertension   . Hyperlipidemia   . Prostatic hypertrophy, benign, with obstruction   . Sick sinus syndrome (HCC)   . Obesity   . Microalbuminuria   . Glaucoma   . Renal calculus   . Osteoarthritis   . Chronic kidney disease, stage III (moderate) (HCC)   . BPH with elevated PSA   . ED (erectile dysfunction)   . RECTAL BLEEDING 04/22/2008  . DYSPHAGIA 04/22/2008  . HYPERLIPIDEMIA 04/18/2008  . HTN (hypertension) 04/18/2008  . ESOPHAGEAL STRICTURE 04/18/2008  . GERD 04/18/2008  . HIATAL HERNIA 04/18/2008  . DIVERTICULOSIS, COLON 04/18/2008  . COLONIC POLYPS, HYPERPLASTIC, HX OF 04/18/2008    Past Surgical History:  Procedure  Laterality Date  . INTRAMEDULLARY (IM) NAIL INTERTROCHANTERIC Left 06/21/2017   Procedure: INTRAMEDULLARY (IM) NAIL INTERTROCHANTRIC Left Leg;  Surgeon: Sheral Apley, MD;  Location: [facility] OR;  Service: Orthopedics;  Laterality: Left;  . INTRAVASCULAR PRESSURE WIRE/FFR STUDY N/A 04/18/2017   Procedure: Intravascular Pressure Wire/FFR Study;  Surgeon: Yates Decamp, MD;  Location: [facility] INVASIVE CV LAB;  Service: Cardiovascular;  Laterality: N/A;  Prox LAD  . KNEE SURGERY    . LOOP RECORDER INSERTION N/A 06/27/2017   Procedure: LOOP RECORDER INSERTION;  Surgeon: Hillis Range, MD;  Location: [facility] INVASIVE CV LAB;   Service: Cardiovascular;  Laterality: N/A;  . REPLACEMENT TOTAL KNEE  02/26/01       Home Medications    Prior to Admission medications   Medication Sig Start Date End Date Taking? Authorizing Provider  brinzolamide (AZOPT) 1 % ophthalmic suspension Place 1 drop into both eyes at bedtime.    Yes [provider]  ELIQUIS 5 MG TABS tablet Take 2.5 mg by mouth 2 (two) times daily.  02/22/17  Yes [provider]  furosemide (LASIX) 40 MG tablet Take 40 mg by mouth 2 (two) times daily. 3 in the morning - 2 in afternoon   Yes [provider]  metolazone (ZAROXOLYN) 2.5 MG tablet Take 2.5 mg by mouth every other day. Filled 09-20-17   Yes [provider]  MYRBETRIQ 50 MG TB24 tablet Take 50 mg by mouth daily. 11/27/15  Yes [provider]  potassium chloride SA (K-DUR,KLOR-CON) 20 MEQ tablet Take 40 mEq by mouth daily.    Yes [provider]  RAPAFLO 8 MG CAPS capsule Take 8 mg by mouth daily. 09/15/15  Yes [provider]  tadalafil (CIALIS) 20 MG tablet Take 20 mg by mouth daily.   Yes [provider]  acetaminophen (TYLENOL) 325 MG tablet Take 650 mg by mouth every 4 (four) hours as needed.    [provider]  allopurinol (ZYLOPRIM) 100 MG tablet Take 100 mg by mouth daily.    [provider]  docusate sodium (COLACE) 100 MG capsule Take 1 capsule (100 mg total) by mouth 2 (two) times daily. 06/28/17   Rai, Ripudeep Kirtland Bouchard, MD  finasteride (PROSCAR) 5 MG tablet Take 5 mg by mouth daily.    [provider]  glimepiride (AMARYL) 2 MG tablet Take 2 mg by mouth daily with breakfast.    [provider]  HYDROcodone-acetaminophen (NORCO) 5-325 MG tablet Take 1 tablet by mouth every 6 (six) hours as needed for moderate pain. 07/26/17   Regalado, Belkys A, MD  iron polysaccharides (NIFEREX) 150 MG capsule Take 1 capsule (150 mg total) by mouth daily. 06/28/17   Rai, Delene Ruffini, MD  nitroGLYCERIN  (NITROSTAT) 0.4 MG SL tablet Place 1 tablet (0.4 mg total) under the tongue every 5 (five) minutes as needed for chest pain. 09/17/15   Nahser, Deloris Ping, MD  simvastatin (ZOCOR) 40 MG tablet Take 40 mg by mouth daily.    [provider]  travoprost, benzalkonium, (TRAVATAN) 0.004 % ophthalmic solution Place 1 drop into both eyes at bedtime.    [provider]    Family History Family History  Problem Relation Age of Onset  . Cancer Paternal Grandfather        STOMACH  . Cancer Sister        MALE CANCER  . Cancer - Colon Sister   . Stroke Father   . Healthy Child   . Healthy Child   .  Other Child        BRAIN DAMAGE AT BIRTH    Social History Social History   Tobacco Use  . Smoking status: Former Smoker    Types: Cigarettes  . Smokeless tobacco: Never Used  Substance Use Topics  . Alcohol use: No  . Drug use: No     Allergies   Patient has no known allergies.   Review of Systems Review of Systems  Constitutional: Negative for fever.  Respiratory: Positive for shortness of breath. Negative for cough.   Cardiovascular: Positive for leg swelling. Negative for chest pain.  Gastrointestinal: Negative for abdominal pain, nausea and vomiting.  All other systems reviewed and are negative.    Physical Exam Updated Vital Signs BP (!) 103/54   Pulse 72   Temp (!) 97.5 F (36.4 C)   Resp 17   Wt 93.4 kg (206 lb)   SpO2 96%   BMI 31.32 kg/m   Physical Exam Physical Exam  Nursing note and vitals reviewed. Constitutional: chronically and acutely ill appearing, non-toxic, and in no acute distress Head: Normocephalic and atraumatic.  Mouth/Throat: Oropharynx is clear and moist.  Neck: Normal range of motion. Neck supple.  Cardiovascular: Normal rate and regular rhythm.   Pulmonary/Chest: Effort normal. No accessory muscle usage. Audible wheezing. Diffuse wheezing. Abdominal: Soft. There is no tenderness. There is no rebound and no guarding.  GU:  urinary catheter in place, draining yellow urine Musculoskeletal: Normal range of motion. +1 bilateral lower extremity edema Neurological: Alert, no facial droop, fluent speech, moves all extremities symmetrically Skin: Skin is warm and dry.  Psychiatric: Cooperative   ED Treatments / Results  Labs (all labs ordered are listed, but only abnormal results are displayed) Labs Reviewed  CBC WITH DIFFERENTIAL/PLATELET - Abnormal; Notable for the following components:      Result Value   Hemoglobin 12.7 (*)    HCT 38.8 (*)    RDW 16.3 (*)    Neutro Abs 8.3 (*)    All other components within normal limits  PROTIME-INR - Abnormal; Notable for the following components:   Prothrombin Time 15.3 (*)    All other components within normal limits  URINALYSIS, ROUTINE W REFLEX MICROSCOPIC - Abnormal; Notable for the following components:   APPearance HAZY (*)    Hgb urine dipstick LARGE (*)    Protein, ur 30 (*)    Leukocytes, UA LARGE (*)    Bacteria, UA MANY (*)    All other components within normal limits  COMPREHENSIVE METABOLIC PANEL - Abnormal; Notable for the following components:   Potassium 3.0 (*)    Chloride 97 (*)    Glucose, Bld 211 (*)    BUN 54 (*)    Creatinine, Ser 1.99 (*)    Albumin 3.4 (*)    ALT 13 (*)    Total Bilirubin 2.0 (*)    GFR calc non Af Amer 28 (*)    GFR calc Af Amer 32 (*)    All other components within normal limits  I-STAT CG4 LACTIC ACID, ED - Abnormal; Notable for the following components:   Lactic Acid, Venous 3.22 (*)    All other components within normal limits  I-STAT CG4 LACTIC ACID, ED - Abnormal; Notable for the following components:   Lactic Acid, Venous 3.38 (*)    All other components within normal limits  CULTURE, BLOOD (ROUTINE X 2)  CULTURE, BLOOD (ROUTINE X 2)  URINE CULTURE  CK  BRAIN NATRIURETIC PEPTIDE  I-STAT TROPONIN,  ED    EKG  EKG Interpretation None       Radiology Dg Chest Portable 1 View  Result Date:  10/13/2017 CLINICAL DATA:  Patient found on the floor with decreased level of consciousness. Wheezing. EXAM: PORTABLE CHEST 1 VIEW COMPARISON:  07/24/2017 FINDINGS: Cardiomediastinal silhouette is normal. Mediastinal contours appear intact. Calcific atherosclerotic disease of the aorta. There is no evidence of pneumothorax. Low lung volumes. Prominence of the interstitial markings. Left pleural effusion. Left lower lobe airspace consolidation versus atelectasis. Osseous structures are without acute abnormality. Soft tissues are grossly normal. IMPRESSION: Low lung volumes with mild interstitial pulmonary edema. Left pleural effusion with left lower lobe atelectasis versus airspace consolidation. Electronically Signed   By: Ted Mcalpine M.D.   On: 10/13/2017 09:48    Procedures Procedures (including critical care time)  Medications Ordered in ED Medications  vancomycin (VANCOCIN) IVPB 1000 mg/200 mL premix (0 mg Intravenous Stopped 10/13/17 1153)  piperacillin-tazobactam (ZOSYN) IVPB 3.375 g (0 g Intravenous Stopped 10/13/17 1123)  furosemide (LASIX) injection 40 mg (40 mg Intravenous Given 10/13/17 1232)     Initial Impression / Assessment and Plan / ED Course  I have reviewed the triage vital signs and the nursing notes.  Pertinent labs & imaging results that were available during my care of the patient were reviewed by me and considered in my medical decision making (see chart for details).     Presents with fall versus syncope.  Patient fluid overloaded on presentation.  With edema, interstitial edema on chest x-ray, and 2 L oxygen requirement. 40 mg IV lasix given for diuresis.   Spoke with [facility] cardiology.  Loop recorder was interrogated and there were no acute events.  His cardiologist will round on him in the hospital later on today.  Patient does not remember fall. No focal neurological deficits. CT head pending.  Blood work notable for elevated lactate, suspect from  being down on ground for long period. No elevated CK.  CXR with possible infiltrate in LL lobe. Covered w/ vanc/zosyn as also w/ cough.   Discussed with Dr. Mariea Clonts who will admit for ongoing management.  Final Clinical Impressions(s) / ED Diagnoses   Final diagnoses:  Acute on chronic diastolic heart failure (HCC)  Hypoxia  Fall, initial encounter    ED Discharge Orders    None       Lavera Guise, MD 10/13/17 1241

## 2017-10-13 NOTE — ED Triage Notes (Addendum)
Pt to ED from home, lives alone, was found on floor this am per home health aid, pt was last seen normal at 1640 last evening.  On arrival -- pt is alert, unsure how long he was on floor-- oriented x 4, unaware of event. Pt has periorbital edema, wheezing audible, receiving resp tx, pt has indwelling catheter with leg bag. Bilateral leg redness/warmth/swelling. Pt has multiple open areas on chest/arms.  Pt has been falling recently with refusal to go to hospital.

## 2017-10-13 NOTE — ED Notes (Signed)
Per Home Health Aid. Pt has gained weight rapidly over the last month. From 136-206lbs currently.   Pt has PPM

## 2017-10-13 NOTE — ED Notes (Signed)
Pt's foley leg bag replaced with standard drainage bag

## 2017-10-13 NOTE — ED Notes (Addendum)
Family is leaving. Pts belongings at the bedside. Glasses bagged and placed in large bag. Stickers on all bags.

## 2017-10-14 DIAGNOSIS — R0902 Hypoxemia: Secondary | ICD-10-CM

## 2017-10-14 DIAGNOSIS — G9340 Encephalopathy, unspecified: Secondary | ICD-10-CM

## 2017-10-14 DIAGNOSIS — I10 Essential (primary) hypertension: Secondary | ICD-10-CM

## 2017-10-14 DIAGNOSIS — L03119 Cellulitis of unspecified part of limb: Secondary | ICD-10-CM

## 2017-10-14 DIAGNOSIS — L02419 Cutaneous abscess of limb, unspecified: Secondary | ICD-10-CM

## 2017-10-14 DIAGNOSIS — G9341 Metabolic encephalopathy: Secondary | ICD-10-CM

## 2017-10-14 LAB — CBC
HEMATOCRIT: 35.3 % — AB (ref 39.0–52.0)
HEMOGLOBIN: 11.2 g/dL — AB (ref 13.0–17.0)
MCH: 28.5 pg (ref 26.0–34.0)
MCHC: 31.7 g/dL (ref 30.0–36.0)
MCV: 89.8 fL (ref 78.0–100.0)
Platelets: 176 10*3/uL (ref 150–400)
RBC: 3.93 MIL/uL — AB (ref 4.22–5.81)
RDW: 16.6 % — ABNORMAL HIGH (ref 11.5–15.5)
WBC: 8.3 10*3/uL (ref 4.0–10.5)

## 2017-10-14 LAB — GLUCOSE, CAPILLARY
GLUCOSE-CAPILLARY: 186 mg/dL — AB (ref 65–99)
GLUCOSE-CAPILLARY: 237 mg/dL — AB (ref 65–99)
Glucose-Capillary: 163 mg/dL — ABNORMAL HIGH (ref 65–99)
Glucose-Capillary: 178 mg/dL — ABNORMAL HIGH (ref 65–99)

## 2017-10-14 LAB — BASIC METABOLIC PANEL
Anion gap: 11 (ref 5–15)
BUN: 51 mg/dL — ABNORMAL HIGH (ref 6–20)
CHLORIDE: 100 mmol/L — AB (ref 101–111)
CO2: 31 mmol/L (ref 22–32)
Calcium: 9 mg/dL (ref 8.9–10.3)
Creatinine, Ser: 1.83 mg/dL — ABNORMAL HIGH (ref 0.61–1.24)
GFR calc non Af Amer: 31 mL/min — ABNORMAL LOW (ref >60)
GFR, EST AFRICAN AMERICAN: 36 mL/min — AB (ref >60)
Glucose, Bld: 184 mg/dL — ABNORMAL HIGH (ref 65–99)
POTASSIUM: 3 mmol/L — AB (ref 3.5–5.1)
SODIUM: 142 mmol/L (ref 135–145)

## 2017-10-14 LAB — URINE CULTURE

## 2017-10-14 MED ORDER — TADALAFIL (PAH) 20 MG PO TABS
20.0000 mg | ORAL_TABLET | Freq: Every day | ORAL | Status: DC
  Administered 2017-10-15 – 2017-10-16 (×2): 20 mg via ORAL
  Filled 2017-10-14 (×3): qty 1

## 2017-10-14 NOTE — Evaluation (Signed)
Physical Therapy Evaluation Patient Details Name: [name] [name] MRN: [id number] DOB: 12/24/1924 Today's Date: 10/14/2017   History of Present Illness  Pt is a 83 y/o male admitted secondary to acute metabolic encephalopathy secondary to cellulitis and UTI. PMH including but not limited to DM, HTN, HLD, CKD and L IM nail on 06/21/17.   Clinical Impression  Pt presented supine in bed with HOB elevated, awake and willing to participate in therapy session. Prior to admission, pt reported that he was independent with all functional mobility and ADLs but then later reported that he uses a RW to ambulate. Pt also has HHRN that comes weekly per pt. No family/caregivers present throughout session to confirm information provided by pt. Pt currently requires min guard for bed mobility, mod A for transfers with RW and close min guard with occasional min A to ambulate a very short distance with use of RW. Pt would continue to benefit from skilled physical therapy services at this time while admitted and after d/c to address the below listed limitations in order to improve overall safety and independence with functional mobility.     Follow Up Recommendations SNF;Supervision/Assistance - 24 hour    Equipment Recommendations  None recommended by PT    Recommendations for Other Services       Precautions / Restrictions Precautions Precautions: Fall Restrictions Weight Bearing Restrictions: No      Mobility  Bed Mobility Overal bed mobility: Needs Assistance Bed Mobility: Supine to Sit;Sit to Supine     Supine to sit: Min guard Sit to supine: Min guard   General bed mobility comments: increased time and effort, min guard for safety  Transfers Overall transfer level: Needs assistance Equipment used: Rolling walker (2 wheeled) Transfers: Sit to/from Stand Sit to Stand: Mod assist         General transfer comment: increased time and effort, mod A to power into standing and with  stability  Ambulation/Gait Ambulation/Gait assistance: Min guard;Min assist Ambulation Distance (Feet): 20 Feet Assistive device: Rolling walker (2 wheeled) Gait Pattern/deviations: Step-to pattern;Step-through pattern;Decreased step length - right;Decreased step length - left;Decreased stride length;Trunk flexed Gait velocity: decreased Gait velocity interpretation: <1.8 ft/sec, indicative of risk for recurrent falls General Gait Details: modest instability with a very slow gait, requiring close min guard for safety and occasional min A with RW to maintain a safe distance from AD  Stairs            Wheelchair Mobility    Modified Rankin (Stroke Patients Only)       Balance Overall balance assessment: Needs assistance Sitting-balance support: Feet supported Sitting balance-Leahy Scale: Fair     Standing balance support: During functional activity;Bilateral upper extremity supported Standing balance-Leahy Scale: Poor Standing balance comment: reliant on bilateral UEs on RW                             Pertinent Vitals/Pain Pain Assessment: No/denies pain    Home Living Family/patient expects to be discharged to:: Private residence Living Arrangements: Alone Available Help at Discharge: Family;Friend(s);Available PRN/intermittently Type of Home: House Home Access: Stairs to enter Entrance Stairs-Rails: Doctor, general practiceight;Left Entrance Stairs-Number of Steps: 2 Home Layout: One level Home Equipment: None      Prior Function Level of Independence: Independent with assistive device(s)         Comments: pt ambulates with use of RW     Hand Dominance   Dominant Hand: Right  Extremity/Trunk Assessment   Upper Extremity Assessment Upper Extremity Assessment: Generalized weakness    Lower Extremity Assessment Lower Extremity Assessment: Generalized weakness       Communication   Communication: HOH  Cognition Arousal/Alertness: Awake/alert Behavior  During Therapy: Flat affect Overall Cognitive Status: Impaired/Different from baseline Area of Impairment: Orientation;Memory;Safety/judgement;Following commands;Problem solving                 Orientation Level: Disoriented to;Situation   Memory: Decreased short-term memory Following Commands: Follows one step commands consistently Safety/Judgement: Decreased awareness of safety;Decreased awareness of deficits   Problem Solving: Difficulty sequencing;Requires verbal cues;Requires tactile cues        General Comments      Exercises     Assessment/Plan    PT Assessment Patient needs continued PT services  PT Problem List Decreased strength;Decreased activity tolerance;Decreased balance;Decreased mobility;Decreased coordination;Decreased cognition;Decreased knowledge of use of DME;Decreased safety awareness       PT Treatment Interventions DME instruction;Gait training;Stair training;Functional mobility training;Therapeutic activities;Therapeutic exercise;Balance training;Neuromuscular re-education;Cognitive remediation;Patient/family education    PT Goals (Current goals can be found in the Care Plan section)  Acute Rehab PT Goals Patient Stated Goal: return home PT Goal Formulation: With patient Time For Goal Achievement: 10/28/17 Potential to Achieve Goals: Fair    Frequency Min 2X/week   Barriers to discharge Decreased caregiver support      Co-evaluation               AM-PAC PT "6 Clicks" Daily Activity  Outcome Measure Difficulty turning over in bed (including adjusting bedclothes, sheets and blankets)?: A Lot Difficulty moving from lying on back to sitting on the side of the bed? : A Lot Difficulty sitting down on and standing up from a chair with arms (e.g., wheelchair, bedside commode, etc,.)?: Unable Help needed moving to and from a bed to chair (including a wheelchair)?: A Little Help needed walking in hospital room?: A Little Help needed  climbing 3-5 steps with a railing? : Total 6 Click Score: 12    End of Session Equipment Utilized During Treatment: Gait belt Activity Tolerance: Patient limited by fatigue Patient left: in bed;with call bell/phone within reach;with bed alarm set;Other (comment)(L mitten reapplied) Nurse Communication: Mobility status PT Visit Diagnosis: Other abnormalities of gait and mobility (R26.89);Difficulty in walking, not elsewhere classified (R26.2)    Time: [id number] PT Time Calculation (min) (ACUTE ONLY): 32 min   Charges:   PT Evaluation $PT Eval Moderate Complexity: 1 Mod PT Treatments $Therapeutic Activity: 8-22 mins   PT G Codes:        N[place] CreekJennifer Jaxx Huish, PT, DPT [phone]   Alessandra BevelsJennifer M Mica Ramdass 10/14/2017, 11:08 AM

## 2017-10-14 NOTE — Progress Notes (Signed)
PROGRESS NOTE    [name] [name]  UJW:[id number] DOB: 21-Jun-1925 DOA: 10/13/2017 PCP: Gaspar Garbeisovec, Richard W, MD   Brief Narrative: [name] [name] is a 83 y.o. male with past medical history relevant for, status post Medtronic loop recorder placement, stage III CKD, urinary retention with chronic indwelling Foley diabetes mellitus, chronic diastolic CHF hypertension. He presented with acute metabolic encephalopathy secondary to UTI. He is improving. PT recommending SNF.   Assessment & Plan:   Principal Problem:   Acute metabolic encephalopathy/UTI/AKI/Cellulitis Active Problems:   Hypertension   Acute on chronic diastolic heart failure (HCC)   Cellulitis and abscess of Bil leg/Rt > Lt   UTI (urinary tract infection)   Acute-on-chronic kidney injury (HCC)   Encephalopathy   Acute metabolic encephalopathy Likely secondary to UTI. Appears resolved today. -treat infections as mentioned below  Catheter associated UTI Clinical diagnosis. Urine culture significant for multiple species. Patient improving on current antibiotic regimen. -Continue Ceftriaxone -Replace foley prior to discharge since it has been one month since insertion  Acute kidney injury on CKD stage 3 Baseline of around 1.3. 1.99 on admision. Trended down slightly  Diabetes mellitus Patient on Amaryl as an outpatient. Last A1C of 6.2%. Recommend goal of less than 7.5%-8% secondary to patient's age and to lessen risk of life threatening hypoglycemia and sequela from hypoglycemia. -SSI while inpatient -will recommend discontinuation of Amaryl on discharge secondary to increased risk of hypoglycemia especially in elderly -Hemoglobin A1C, base outpatient regimen off of result  Chronic diastolic heart failure Cardiology evaluated and patient is at baseline -Continue Lasix/metolazone  Hypokalemia Secondary to diuresis -kdur  BPH Patient with a chronic foley -Continue Foley; replace at discharge -Continue  Rapaflo, finasteride  Pulmonary artery hypertension -Continue tadalafil  Paroxysmal atrial fibrillation Stable. No events on loop recorder.   DVT prophylaxis: SCDs Code Status: DNR Family Communication: None at bedside Disposition Plan: Discharge to SNF   Consultants:   Cardiology  Procedures:   None  Antimicrobials:  Ceftriaxone    Subjective: No concerns today.  Objective: Vitals:   10/14/17 0100 10/14/17 0300 10/14/17 0500 10/14/17 1001  BP: 99/61 110/70 (!) 112/53 (!) 99/47  Pulse: 84 75 60 60  Resp: 20 (!) 21 20 18   Temp: 99.6 F (37.6 C) 99.8 F (37.7 C) 98.9 F (37.2 C) 98 F (36.7 C)  TempSrc: Axillary Axillary Axillary Oral  SpO2: 90% (!) 2% 91% 97%  Weight:        Intake/Output Summary (Last 24 hours) at 10/14/2017 1424 Last data filed at 10/14/2017 0900 Gross per 24 hour  Intake 365 ml  Output 900 ml  Net -535 ml   Filed Weights   10/13/17 0933 10/13/17 1813  Weight: 93.4 kg (206 lb) 93.4 kg (205 lb 16 oz)    Examination:  General exam: Appears calm and comfortable Respiratory system: Clear to auscultation. Respiratory effort normal. Cardiovascular system: S1 & S2 heard, RRR. Gastrointestinal system: Abdomen is nondistended, soft and nontender. No organomegaly or masses felt. Normal bowel sounds heard. Central nervous system: Alert and oriented. No focal neurological deficits. Extremities: No edema. No calf tenderness Skin: No cyanosis. No rashes Psychiatry: Judgement and insight appear normal. Mood & affect appropriate.     Data Reviewed: I have personally reviewed following labs and imaging studies  CBC: Recent Labs  Lab 10/13/17 0925 10/14/17 0411  WBC 9.5 8.3  NEUTROABS 8.3*  --   HGB 12.7* 11.2*  HCT 38.8* 35.3*  MCV 90.0 89.8  PLT 220 176  Basic Metabolic Panel: Recent Labs  Lab 10/13/17 1052 10/14/17 0411  NA 141 142  K 3.0* 3.0*  CL 97* 100*  CO2 30 31  GLUCOSE 211* 184*  BUN 54* 51*  CREATININE 1.99*  1.83*  CALCIUM 9.1 9.0   GFR: Estimated Creatinine Clearance: 29.8 mL/min (A) (by C-G formula based on SCr of 1.83 mg/dL (H)). Liver Function Tests: Recent Labs  Lab 10/13/17 1052  AST 32  ALT 13*  ALKPHOS 106  BILITOT 2.0*  PROT 6.9  ALBUMIN 3.4*   No results for input(s): LIPASE, AMYLASE in the last 168 hours. No results for input(s): AMMONIA in the last 168 hours. Coagulation Profile: Recent Labs  Lab 10/13/17 0925  INR 1.22   Cardiac Enzymes: Recent Labs  Lab 10/13/17 1052  CKTOTAL 203   BNP (last 3 results) No results for input(s): PROBNP in the last 8760 hours. HbA1C: No results for input(s): HGBA1C in the last 72 hours. CBG: Recent Labs  Lab 10/13/17 2157 10/14/17 0000 10/14/17 0829 10/14/17 1146  GLUCAP 222* 201* 163* 178*   Lipid Profile: No results for input(s): CHOL, HDL, LDLCALC, TRIG, CHOLHDL, LDLDIRECT in the last 72 hours. Thyroid Function Tests: No results for input(s): TSH, T4TOTAL, FREET4, T3FREE, THYROIDAB in the last 72 hours. Anemia Panel: No results for input(s): VITAMINB12, FOLATE, FERRITIN, TIBC, IRON, RETICCTPCT in the last 72 hours. Sepsis Labs: Recent Labs  Lab 10/13/17 0940 10/13/17 1202  LATICACIDVEN 3.22* 3.38*    Recent Results (from the past 240 hour(s))  Urine culture     Status: Abnormal   Collection Time: 10/13/17 10:52 AM  Result Value Ref Range Status   Specimen Description URINE, CATHETERIZED  Final   Special Requests NONE  Final   Culture MULTIPLE SPECIES PRESENT, SUGGEST RECOLLECTION (A)  Final   Report Status 10/14/2017 FINAL  Final         Radiology Studies: Ct Head Wo Contrast  Result Date: 10/13/2017 CLINICAL DATA:  Altered mental status. EXAM: CT HEAD WITHOUT CONTRAST TECHNIQUE: Contiguous axial images were obtained from the base of the skull through the vertex without intravenous contrast. COMPARISON:  06/20/2017 FINDINGS: Brain: Stable age related cerebral atrophy, ventriculomegaly and  periventricular white matter disease. No extra-axial fluid collections are identified. No CT findings for acute hemispheric infarction or intracranial hemorrhage. No mass lesions. The brainstem and cerebellum are normal. Vascular: Stable dense vascular calcifications. No hyperdense vessel or unexpected calcification. Skull: No fracture or bone lesion. Sinuses/Orbits: The paranasal sinuses and mastoid air cells are clear except for minimal areas of mucoperiosteal thickening. The globes are intact. Other: No scalp lesion or hematoma. IMPRESSION: Stable age related cerebral atrophy, ventriculomegaly and periventricular white matter disease. No acute intracranial findings. Electronically Signed   By: Rudie Meyer M.D.   On: 10/13/2017 13:10   Dg Chest Portable 1 View  Result Date: 10/13/2017 CLINICAL DATA:  Patient found on the floor with decreased level of consciousness. Wheezing. EXAM: PORTABLE CHEST 1 VIEW COMPARISON:  07/24/2017 FINDINGS: Cardiomediastinal silhouette is normal. Mediastinal contours appear intact. Calcific atherosclerotic disease of the aorta. There is no evidence of pneumothorax. Low lung volumes. Prominence of the interstitial markings. Left pleural effusion. Left lower lobe airspace consolidation versus atelectasis. Osseous structures are without acute abnormality. Soft tissues are grossly normal. IMPRESSION: Low lung volumes with mild interstitial pulmonary edema. Left pleural effusion with left lower lobe atelectasis versus airspace consolidation. Electronically Signed   By: Ted Mcalpine M.D.   On: 10/13/2017 09:48  Scheduled Meds: . allopurinol  100 mg Oral Daily  . brinzolamide  1 drop Both Eyes QHS  . docusate sodium  100 mg Oral BID  . finasteride  5 mg Oral Daily  . furosemide  40 mg Intravenous Q12H  . insulin aspart  0-9 Units Subcutaneous TID WC  . iron polysaccharides  150 mg Oral Q breakfast  . [START ON 10/16/2017] metolazone  2.5 mg Oral Q M,W,F  .  mirabegron ER  50 mg Oral Daily  . potassium chloride SA  40 mEq Oral Daily  . senna  1 tablet Oral BID  . sodium chloride flush  3 mL Intravenous Q12H  . tadalafil (PAH)  20 mg Oral Daily  . tamsulosin  0.4 mg Oral QPC supper  . Travoprost (BAK Free)  1 drop Both Eyes QHS   Continuous Infusions: . sodium chloride    . cefTRIAXone (ROCEPHIN)  IV Stopped (10/13/17 1843)     LOS: 1 day     Jacquelin Hawkingalph Nettey, MD [facility] 10/14/2017, 2:24 PM Pager: [phone]  If 7PM-7AM, please contact night-coverage [email or web address] Password [email or web address] 10/14/2017, 2:24 PM

## 2017-10-14 NOTE — Progress Notes (Signed)
After reading Progress Note History, HHA reported Foley being placed 1 month before 10/13/17 admit date.  ED CNA documented in Progress Note changing drainage bag 10/13/17.  MD wrote after ABT course for UTI to replace Foley before DC.

## 2017-10-15 LAB — GLUCOSE, CAPILLARY
GLUCOSE-CAPILLARY: 155 mg/dL — AB (ref 65–99)
GLUCOSE-CAPILLARY: 197 mg/dL — AB (ref 65–99)
GLUCOSE-CAPILLARY: 188 mg/dL — AB (ref 65–99)
GLUCOSE-CAPILLARY: 175 mg/dL — AB (ref 65–99)
Glucose-Capillary: 135 mg/dL — ABNORMAL HIGH (ref 65–99)
Glucose-Capillary: 154 mg/dL — ABNORMAL HIGH (ref 65–99)
Glucose-Capillary: 172 mg/dL — ABNORMAL HIGH (ref 65–99)

## 2017-10-15 LAB — BASIC METABOLIC PANEL
Anion gap: 8 (ref 5–15)
BUN: 48 mg/dL — ABNORMAL HIGH (ref 6–20)
CALCIUM: 9 mg/dL (ref 8.9–10.3)
CHLORIDE: 100 mmol/L — AB (ref 101–111)
CO2: 36 mmol/L — ABNORMAL HIGH (ref 22–32)
CREATININE: 1.78 mg/dL — AB (ref 0.61–1.24)
GFR calc non Af Amer: 32 mL/min — ABNORMAL LOW (ref >60)
GFR, EST AFRICAN AMERICAN: 37 mL/min — AB (ref >60)
Glucose, Bld: 183 mg/dL — ABNORMAL HIGH (ref 65–99)
Potassium: 3 mmol/L — ABNORMAL LOW (ref 3.5–5.1)
SODIUM: 144 mmol/L (ref 135–145)

## 2017-10-15 LAB — LACTIC ACID, PLASMA: LACTIC ACID, VENOUS: 1.6 mmol/L (ref 0.5–1.9)

## 2017-10-15 LAB — HEMOGLOBIN A1C
Hgb A1c MFr Bld: 8.4 % — ABNORMAL HIGH (ref 4.8–5.6)
Mean Plasma Glucose: 194 mg/dL

## 2017-10-15 MED ORDER — POTASSIUM CHLORIDE CRYS ER 20 MEQ PO TBCR
40.0000 meq | EXTENDED_RELEASE_TABLET | Freq: Once | ORAL | Status: AC
  Administered 2017-10-15: 40 meq via ORAL
  Filled 2017-10-15: qty 2

## 2017-10-15 MED ORDER — ENOXAPARIN SODIUM 40 MG/0.4ML ~~LOC~~ SOLN
40.0000 mg | SUBCUTANEOUS | Status: DC
  Administered 2017-10-15 – 2017-10-16 (×2): 40 mg via SUBCUTANEOUS
  Filled 2017-10-15 (×2): qty 0.4

## 2017-10-15 MED ORDER — FUROSEMIDE 40 MG PO TABS
40.0000 mg | ORAL_TABLET | Freq: Two times a day (BID) | ORAL | Status: DC
  Administered 2017-10-15 – 2017-10-16 (×4): 40 mg via ORAL
  Filled 2017-10-15 (×4): qty 1

## 2017-10-15 NOTE — Progress Notes (Signed)
Subjective:  Says "he feels bad" but not able to pinpoint why. States "I guess it's old age" Breathing, edema stable  Objective:  Vital Signs in the last 24 hours: Temp:  [97.6 F (36.4 C)-98.5 F (36.9 C)] 97.7 F (36.5 C) (12/16 0946) Pulse Rate:  [60-66] 61 (12/16 0946) Resp:  [16-18] 16 (12/16 0946) BP: (99-112)/(47-87) 112/52 (12/16 0946) SpO2:  [95 %-100 %] 95 % (12/16 0946) FiO2 (%):  [2 %] 2 % (12/15 1001)  Intake/Output from previous day: 12/15 0701 - 12/16 0700 In: 120 [P.O.:120] Out: 2010 [Urine:2010] Intake/Output from this shift: No intake/output data recorded.   Net -2.7 L  Physical Exam: Nursing note and vitals reviewed. Constitutional: He is oriented to person, place, and time. He appears well-developed.  HENT:  Head: Normocephalic.  Eyes: Pupils are equal, round, and reactive to light.  Left subconjunctival hemorrhage  Neck: Neck supple. JVD present.  Cardiovascular: Normal rate.  Murmur (3/6 holosystolic murmur RLSB) heard. Irregularly irregular   Respiratory: Effort normal. He has no wheezes. He has no rales.  B/l coarse breath sounds   GI: Soft. Bowel sounds are normal. He exhibits no distension. There is no tenderness.  Genitourinary:  Genitourinary Comments: Foley catheter in place. Scrotal edema    Musculoskeletal: Normal range of motion. He exhibits edema (1+).  Neurological: He is alert and oriented to person, place, and time. No cranial nerve deficit.  Skin: Skin is warm and dry.      Lab Results: Recent Labs    10/13/17 0925 10/14/17 0411  WBC 9.5 8.3  HGB 12.7* 11.2*  PLT 220 176   Recent Labs    10/14/17 0411 10/15/17 0832  NA 142 144  K 3.0* 3.0*  CL 100* 100*  CO2 31 36*  GLUCOSE 184* 183*  BUN 51* 48*  CREATININE 1.83* 1.78*   No results for input(s): TROPONINI in the last 72 hours.  Invalid input(s): CK, MB Hepatic Function Panel Recent Labs    10/13/17 1052  PROT 6.9  ALBUMIN 3.4*  AST 32  ALT 13*   ALKPHOS 106  BILITOT 2.0*    Imaging: EXAM: CT HEAD WITHOUT CONTRAST  TECHNIQUE: Contiguous axial images were obtained from the base of the skull through the vertex without intravenous contrast.  COMPARISON:  06/20/2017  FINDINGS: Brain: Stable age related cerebral atrophy, ventriculomegaly and periventricular white matter disease. No extra-axial fluid collections are identified. No CT findings for acute hemispheric infarction or intracranial hemorrhage. No mass lesions. The brainstem and cerebellum are normal.  Vascular: Stable dense vascular calcifications. No hyperdense vessel or unexpected calcification.  Skull: No fracture or bone lesion.  Sinuses/Orbits: The paranasal sinuses and mastoid air cells are clear except for minimal areas of mucoperiosteal thickening. The globes are intact.  Other: No scalp lesion or hematoma.  IMPRESSION: Stable age related cerebral atrophy, ventriculomegaly and periventricular white matter disease.  No acute intracranial findings.  Cardiac Studies:  Assessment & Recommendations:  83 year old patient Caucasian male with chronic diastolic heart failure, pulmonary hypertensio, persistent Afib with slow ventricular response, CAD, hypertension, DM, CKD 2, hyperlipidemia, syncopal episode leading to left femur intertrochanteric fracture s/p nail procedure 06/21/2017.  Admitted with confusion, found to have UTI and metabolic encephalopathy  Metabolic encephalopathy, hypokalemia: Improving Likely precipitated by UTI. Management per the primary team. Unfortunately, Foley catheter is constant source of infection. He has baseline prostate enlargement. Consider urology input.   Chronic diastolic heart failure, pulmonary hypertension, Grp I/II: No significant decompensation, compared to baseline. Continue  IV lasix 40 mg bid, metolazone 2.5 mg every other day.  Add PO lasix 40 mg bid today, with possible discontinuation of IV lasix  tomorrow 12/17. Continue Adcirca 20 mg once daily.  Peristent Afib with slow ventricular rate Stable. CHADVASC score 5, high risk, 6.7% Not on anticoagulation due to frequent falls No events on loop recorder  CAD: Known chronic RCA occlusion.Mild troponin elevation, likely supply demand mismatch.Conrtinue statin. Reasonable to hold Aspirin while on eliquis.  AKI/CKD: Improving  Discussed goals of care with patient and daughter Gaston IslamCynthia Huffines on 10/14/17. He is DNR. Goal will be to minimize recurrent hospitalizations. I have encouraged them to consider 24X7 nursing care. Case managment will be involved.     LOS: 2 days    Giovanna Kemmerer J Natosha Bou 10/15/2017, 9:49 AM  Shewanda Sharpe Emiliano DyerJ Lakitha Gordy, MD [facility] Pager: [phone] Office: [phone] If no answer Cell [phone]

## 2017-10-15 NOTE — Progress Notes (Addendum)
PROGRESS NOTE    [name] [name]  ZOX:[id number] DOB: Mar 29, 1925 DOA: 10/13/2017 PCP: Gaspar Garbeisovec, Richard W, MD   Brief Narrative: [name] [name] is a 83 y.o. male with past medical history relevant for, status post Medtronic loop recorder placement, stage III CKD, urinary retention with chronic indwelling Foley diabetes mellitus, chronic diastolic CHF hypertension. He presented with acute metabolic encephalopathy secondary to UTI. He is improving. PT recommending SNF.   Assessment & Plan:   Principal Problem:   Acute metabolic encephalopathy/UTI/AKI/Cellulitis Active Problems:   Hypertension   Acute on chronic diastolic heart failure (HCC)   Cellulitis and abscess of Bil leg/Rt > Lt   UTI (urinary tract infection)   Acute-on-chronic kidney injury (HCC)   Encephalopathy   Acute metabolic encephalopathy Likely secondary to UTI. Resolved. -treat infections as mentioned below  Catheter associated UTI Clinical diagnosis. Urine culture significant for multiple species. Patient improving on current antibiotic regimen. -Continue Ceftriaxone -Replace foley prior to discharge since it has been one month since insertion  Acute kidney injury on CKD stage 3 Baseline of around 1.3. 1.99 on admision. Trended down with diuresis  Diabetes mellitus Patient on Amaryl as an outpatient. Last A1C of 6.2%. Recommend goal of less than 7.5%-8% secondary to patient's age and to lessen risk of life threatening hypoglycemia and sequela from hypoglycemia. -SSI while inpatient -will recommend discontinuation of Amaryl on discharge secondary to increased risk of hypoglycemia especially in elderly -Hemoglobin A1C, base outpatient regimen off of result  Chronic diastolic heart failure Cardiology evaluated and patient is at baseline -Continue Lasix/metolazone, transitioned to PO and IV with plan to discontinue IV in AM  Hypokalemia Secondary to diuresis -kdur daily; give another dose this  afternoon  BPH Patient with a chronic foley -Continue Foley; replace at discharge -Continue Rapaflo, finasteride -Outpatient Urology follow-up  Pulmonary artery hypertension -Continue tadalafil  Paroxysmal atrial fibrillation Stable. No events on loop recorder.  Cellulitis Infection vs chronic dermatitis. -Continue ceftriaxone   DVT prophylaxis: SCDs, Lovenox Code Status: DNR Family Communication: None at bedside Disposition Plan: Discharge to SNF   Consultants:   Cardiology  Procedures:   None  Antimicrobials:  Ceftriaxone    Subjective: Feels bad secondary to old age. No specific concerns/issues  Objective: Vitals:   10/14/17 2206 10/15/17 0113 10/15/17 0600 10/15/17 0946  BP: 105/71 (!) 111/59 108/87 (!) 112/52  Pulse: 62 60 60 61  Resp: 16 16 18 16   Temp: 97.6 F (36.4 C) 97.8 F (36.6 C) 98.1 F (36.7 C) 97.7 F (36.5 C)  TempSrc: Oral Oral Oral Oral  SpO2: 97% 96% 98% 95%  Weight:        Intake/Output Summary (Last 24 hours) at 10/15/2017 1058 Last data filed at 10/15/2017 0700 Gross per 24 hour  Intake -  Output 1510 ml  Net -1510 ml   Filed Weights   10/13/17 0933 10/13/17 1813  Weight: 93.4 kg (206 lb) 93.4 kg (205 lb 16 oz)    Examination:  General exam: Appears calm and comfortable Respiratory system: Clear to auscultation. Respiratory effort normal. Cardiovascular system: S1 & S2 heard, RRR. Gastrointestinal system: Abdomen is nondistended, soft and nontender. No organomegaly or masses felt. Normal bowel sounds heard. Central nervous system: Alert and oriented. No focal neurological deficits. Extremities: No edema. No calf tenderness Skin: No cyanosis. No rashes Psychiatry: Judgement and insight appear normal. Mood & affect appropriate.     Data Reviewed: I have personally reviewed following labs and imaging studies  CBC: Recent Labs  Lab 10/13/17 0925 10/14/17 0411  WBC 9.5 8.3  NEUTROABS 8.3*  --   HGB 12.7*  11.2*  HCT 38.8* 35.3*  MCV 90.0 89.8  PLT 220 176   Basic Metabolic Panel: Recent Labs  Lab 10/13/17 1052 10/14/17 0411 10/15/17 0832  NA 141 142 144  K 3.0* 3.0* 3.0*  CL 97* 100* 100*  CO2 30 31 36*  GLUCOSE 211* 184* 183*  BUN 54* 51* 48*  CREATININE 1.99* 1.83* 1.78*  CALCIUM 9.1 9.0 9.0   GFR: Estimated Creatinine Clearance: 30.6 mL/min (A) (by C-G formula based on SCr of 1.78 mg/dL (H)). Liver Function Tests: Recent Labs  Lab 10/13/17 1052  AST 32  ALT 13*  ALKPHOS 106  BILITOT 2.0*  PROT 6.9  ALBUMIN 3.4*   No results for input(s): LIPASE, AMYLASE in the last 168 hours. No results for input(s): AMMONIA in the last 168 hours. Coagulation Profile: Recent Labs  Lab 10/13/17 0925  INR 1.22   Cardiac Enzymes: Recent Labs  Lab 10/13/17 1052  CKTOTAL 203   BNP (last 3 results) No results for input(s): PROBNP in the last 8760 hours. HbA1C: No results for input(s): HGBA1C in the last 72 hours. CBG: Recent Labs  Lab 10/14/17 2006 10/15/17 0013 10/15/17 0424 10/15/17 0734 10/15/17 0815  GLUCAP 237* 172* 155* 154* 197*   Lipid Profile: No results for input(s): CHOL, HDL, LDLCALC, TRIG, CHOLHDL, LDLDIRECT in the last 72 hours. Thyroid Function Tests: No results for input(s): TSH, T4TOTAL, FREET4, T3FREE, THYROIDAB in the last 72 hours. Anemia Panel: No results for input(s): VITAMINB12, FOLATE, FERRITIN, TIBC, IRON, RETICCTPCT in the last 72 hours. Sepsis Labs: Recent Labs  Lab 10/13/17 0940 10/13/17 1202 10/15/17 0832  LATICACIDVEN 3.22* 3.38* 1.6    Recent Results (from the past 240 hour(s))  Culture, blood (Routine x 2)     Status: None (Preliminary result)   Collection Time: 10/13/17  9:25 AM  Result Value Ref Range Status   Specimen Description BLOOD LEFT ANTECUBITAL  Final   Special Requests   Final    BOTTLES DRAWN AEROBIC AND ANAEROBIC Blood Culture adequate volume   Culture NO GROWTH 1 DAY  Final   Report Status PENDING   Incomplete  Culture, blood (Routine x 2)     Status: None (Preliminary result)   Collection Time: 10/13/17  9:35 AM  Result Value Ref Range Status   Specimen Description BLOOD RIGHT FOREARM  Final   Special Requests   Final    BOTTLES DRAWN AEROBIC ONLY Blood Culture adequate volume   Culture NO GROWTH 1 DAY  Final   Report Status PENDING  Incomplete  Urine culture     Status: Abnormal   Collection Time: 10/13/17 10:52 AM  Result Value Ref Range Status   Specimen Description URINE, CATHETERIZED  Final   Special Requests NONE  Final   Culture MULTIPLE SPECIES PRESENT, SUGGEST RECOLLECTION (A)  Final   Report Status 10/14/2017 FINAL  Final         Radiology Studies: Ct Head Wo Contrast  Result Date: 10/13/2017 CLINICAL DATA:  Altered mental status. EXAM: CT HEAD WITHOUT CONTRAST TECHNIQUE: Contiguous axial images were obtained from the base of the skull through the vertex without intravenous contrast. COMPARISON:  06/20/2017 FINDINGS: Brain: Stable age related cerebral atrophy, ventriculomegaly and periventricular white matter disease. No extra-axial fluid collections are identified. No CT findings for acute hemispheric infarction or intracranial hemorrhage. No mass lesions. The brainstem and cerebellum are normal. Vascular: Stable dense  vascular calcifications. No hyperdense vessel or unexpected calcification. Skull: No fracture or bone lesion. Sinuses/Orbits: The paranasal sinuses and mastoid air cells are clear except for minimal areas of mucoperiosteal thickening. The globes are intact. Other: No scalp lesion or hematoma. IMPRESSION: Stable age related cerebral atrophy, ventriculomegaly and periventricular white matter disease. No acute intracranial findings. Electronically Signed   By: Rudie MeyerP.  Gallerani M.D.   On: 10/13/2017 13:10        Scheduled Meds: . allopurinol  100 mg Oral Daily  . brinzolamide  1 drop Both Eyes QHS  . docusate sodium  100 mg Oral BID  . finasteride  5 mg  Oral Daily  . furosemide  40 mg Intravenous Q12H  . furosemide  40 mg Oral BID  . insulin aspart  0-9 Units Subcutaneous TID WC  . iron polysaccharides  150 mg Oral Q breakfast  . [START ON 10/16/2017] metolazone  2.5 mg Oral Q M,W,F  . mirabegron ER  50 mg Oral Daily  . potassium chloride SA  40 mEq Oral Daily  . senna  1 tablet Oral BID  . sodium chloride flush  3 mL Intravenous Q12H  . tadalafil (PAH)  20 mg Oral Daily  . tamsulosin  0.4 mg Oral QPC supper  . Travoprost (BAK Free)  1 drop Both Eyes QHS   Continuous Infusions: . sodium chloride    . cefTRIAXone (ROCEPHIN)  IV 1 g (10/14/17 1739)     LOS: 2 days     Jacquelin Hawkingalph Leanndra Pember, MD [facility] 10/15/2017, 10:58 AM Pager: [phone]  If 7PM-7AM, please contact night-coverage [email or web address] Password [email or web address] 10/15/2017, 10:58 AM

## 2017-10-16 DIAGNOSIS — J811 Chronic pulmonary edema: Secondary | ICD-10-CM | POA: Diagnosis not present

## 2017-10-16 DIAGNOSIS — M6281 Muscle weakness (generalized): Secondary | ICD-10-CM | POA: Diagnosis not present

## 2017-10-16 DIAGNOSIS — G9340 Encephalopathy, unspecified: Secondary | ICD-10-CM | POA: Diagnosis not present

## 2017-10-16 DIAGNOSIS — N4889 Other specified disorders of penis: Secondary | ICD-10-CM | POA: Diagnosis not present

## 2017-10-16 DIAGNOSIS — N400 Enlarged prostate without lower urinary tract symptoms: Secondary | ICD-10-CM | POA: Diagnosis not present

## 2017-10-16 DIAGNOSIS — L0390 Cellulitis, unspecified: Secondary | ICD-10-CM | POA: Diagnosis not present

## 2017-10-16 DIAGNOSIS — Z7409 Other reduced mobility: Secondary | ICD-10-CM | POA: Diagnosis not present

## 2017-10-16 DIAGNOSIS — I509 Heart failure, unspecified: Secondary | ICD-10-CM | POA: Diagnosis not present

## 2017-10-16 DIAGNOSIS — I482 Chronic atrial fibrillation: Secondary | ICD-10-CM | POA: Diagnosis not present

## 2017-10-16 DIAGNOSIS — L02419 Cutaneous abscess of limb, unspecified: Secondary | ICD-10-CM | POA: Diagnosis not present

## 2017-10-16 DIAGNOSIS — E876 Hypokalemia: Secondary | ICD-10-CM | POA: Diagnosis not present

## 2017-10-16 DIAGNOSIS — K4090 Unilateral inguinal hernia, without obstruction or gangrene, not specified as recurrent: Secondary | ICD-10-CM | POA: Diagnosis not present

## 2017-10-16 DIAGNOSIS — Z4509 Encounter for adjustment and management of other cardiac device: Secondary | ICD-10-CM | POA: Diagnosis not present

## 2017-10-16 DIAGNOSIS — E119 Type 2 diabetes mellitus without complications: Secondary | ICD-10-CM | POA: Diagnosis not present

## 2017-10-16 DIAGNOSIS — N390 Urinary tract infection, site not specified: Secondary | ICD-10-CM | POA: Diagnosis not present

## 2017-10-16 DIAGNOSIS — Z9181 History of falling: Secondary | ICD-10-CM | POA: Diagnosis not present

## 2017-10-16 DIAGNOSIS — R2689 Other abnormalities of gait and mobility: Secondary | ICD-10-CM | POA: Diagnosis not present

## 2017-10-16 DIAGNOSIS — G9341 Metabolic encephalopathy: Secondary | ICD-10-CM | POA: Diagnosis not present

## 2017-10-16 DIAGNOSIS — R55 Syncope and collapse: Secondary | ICD-10-CM | POA: Diagnosis not present

## 2017-10-16 DIAGNOSIS — R4182 Altered mental status, unspecified: Secondary | ICD-10-CM | POA: Diagnosis not present

## 2017-10-16 DIAGNOSIS — I10 Essential (primary) hypertension: Secondary | ICD-10-CM | POA: Diagnosis not present

## 2017-10-16 DIAGNOSIS — L03119 Cellulitis of unspecified part of limb: Secondary | ICD-10-CM | POA: Diagnosis not present

## 2017-10-16 DIAGNOSIS — I5033 Acute on chronic diastolic (congestive) heart failure: Secondary | ICD-10-CM | POA: Diagnosis not present

## 2017-10-16 DIAGNOSIS — N183 Chronic kidney disease, stage 3 (moderate): Secondary | ICD-10-CM | POA: Diagnosis not present

## 2017-10-16 DIAGNOSIS — Z95818 Presence of other cardiac implants and grafts: Secondary | ICD-10-CM | POA: Diagnosis not present

## 2017-10-16 DIAGNOSIS — R278 Other lack of coordination: Secondary | ICD-10-CM | POA: Diagnosis not present

## 2017-10-16 DIAGNOSIS — I5032 Chronic diastolic (congestive) heart failure: Secondary | ICD-10-CM | POA: Diagnosis not present

## 2017-10-16 LAB — GLUCOSE, CAPILLARY
GLUCOSE-CAPILLARY: 138 mg/dL — AB (ref 65–99)
GLUCOSE-CAPILLARY: 222 mg/dL — AB (ref 65–99)
Glucose-Capillary: 191 mg/dL — ABNORMAL HIGH (ref 65–99)

## 2017-10-16 MED ORDER — FUROSEMIDE 40 MG PO TABS: 40.0000 mg | ORAL_TABLET | Freq: Two times a day (BID) | ORAL | Status: DC

## 2017-10-16 MED ORDER — TADALAFIL (PAH) 20 MG PO TABS: 20.0000 mg | ORAL_TABLET | Freq: Every day | ORAL | Status: AC

## 2017-10-16 MED ORDER — POTASSIUM CHLORIDE CRYS ER 20 MEQ PO TBCR
40.0000 meq | EXTENDED_RELEASE_TABLET | Freq: Once | ORAL | Status: AC
  Administered 2017-10-16: 40 meq via ORAL
  Filled 2017-10-16: qty 2

## 2017-10-16 MED ORDER — SITAGLIPTIN PHOSPHATE 25 MG PO TABS: 25.0000 mg | ORAL_TABLET | Freq: Every day | ORAL | Status: AC

## 2017-10-16 MED ORDER — POLYETHYLENE GLYCOL 3350 17 G PO PACK: 17.0000 g | PACK | Freq: Every day | ORAL | Status: AC | PRN

## 2017-10-16 MED ORDER — CEFPODOXIME PROXETIL 200 MG PO TABS: 200.0000 mg | ORAL_TABLET | Freq: Every day | ORAL | Status: AC

## 2017-10-16 NOTE — Progress Notes (Signed)
Inpatient Diabetes Program Recommendations  AACE/ADA: New Consensus Statement on Inpatient Glycemic Control (2015)  Target Ranges:  Prepandial:   less than 140 mg/dL      Peak postprandial:   less than 180 mg/dL (1-2 hours)      Critically ill patients:  140 - 180 mg/dL   Results for [name] [name], Herron E (MRN [id number]) as of 10/16/2017 08:40  Ref. Range 10/15/2017 08:15 10/15/2017 11:02 10/15/2017 16:53 10/15/2017 21:29  Glucose-Capillary Latest Ref Range: 65 - 99 mg/dL 865197 (H) 784188 (H) 696135 (H) 175 (H)   Results for [name] [name], Jaheem E (MRN [id number]) as of 10/16/2017 08:40  Ref. Range 06/25/2017 06:25 10/14/2017 16:35  Hemoglobin A1C Latest Ref Range: 4.8 - 5.6 % 6.2 (H) 8.4 (H)    Admit with: AMS/ UTI  History: DM, CKD  Home DM Meds: Amaryl 2 mg daily  Current Insulin Orders: Novolog Sensitive Correction Scale/ SSI (0-9 units) TID AC       MD- Note CBGs well controlled here in hospital.  Current A1c= 8.4%.  If patient needs medication for home, recommend discontinuation of Amaryl (as Amaryl would place patient at higher risk for Hypoglycemia).  Could try having patient use DPP-4 inhibitor medication like Tradjenta 5 mg daily.  Tradjenta does not need to be renally dosed and has lower risk for hypoglycemia since it works in a glucose dependent fashion.      --Will follow patient during hospitalization--  Ambrose FinlandJeannine Johnston Toshio Slusher RN, MSN, CDE Diabetes Coordinator Inpatient Glycemic Control Team Team Pager: [phone] (8a-5p)

## 2017-10-16 NOTE — Progress Notes (Signed)
PROGRESS NOTE    [name] [name]  WGN:[id number] DOB: 19-Apr-1925 DOA: 10/13/2017 PCP: Gaspar Garbeisovec, Richard W, MD   Brief Narrative: [name] [name] is a 83 y.o. male with past medical history relevant for, status post [employer] loop recorder placement, stage III CKD, urinary retention with chronic indwelling Foley diabetes mellitus, chronic diastolic CHF hypertension. He presented with acute metabolic encephalopathy secondary to UTI. He is improving. PT recommending SNF.   Assessment & Plan:   Principal Problem:   Acute metabolic encephalopathy/UTI/AKI/Cellulitis Active Problems:   Hypertension   Acute on chronic diastolic heart failure (HCC)   Cellulitis and abscess of Bil leg/Rt > Lt   UTI (urinary tract infection)   Acute-on-chronic kidney injury (HCC)   Encephalopathy   Acute metabolic encephalopathy Likely secondary to UTI. Resolved. -treat infections as mentioned below  Catheter associated UTI Clinical diagnosis. Urine culture significant for multiple species. Patient improving on current antibiotic regimen. Still with some dysuria. -Continue Ceftriaxone -Replace foley  Acute kidney injury on CKD stage 3 Baseline of around 1.3. 1.99 on admision. Trended down with diuresis  Diabetes mellitus Patient on Amaryl as an outpatient. Last A1C of 6.2%. Recommend goal of less than 7.5%-8% secondary to patient's age and to lessen risk of life threatening hypoglycemia and sequela from hypoglycemia. Hemoglobin A1C is now 8.4% -SSI while inpatient -will recommend discontinuation of Amaryl on discharge secondary to increased risk of hypoglycemia especially in elderly -Recommend metformin on discharge  Chronic diastolic heart failure Cardiology evaluated and patient is at baseline -Continue Lasix/metolazone, transitioned to PO and IV with plan to discontinue IV in AM  Hypokalemia Secondary to diuresis -kdur daily; give another dose of 40 meq this afternoon. Will need to  increase to BID if continues IV diuresis  BPH Patient with a chronic foley -Continue Foley; replace -Continue Rapaflo, finasteride -Outpatient Urology follow-up  Pulmonary artery hypertension -Continue tadalafil  Paroxysmal atrial fibrillation Stable. No events on loop recorder.  Cellulitis Infection vs chronic dermatitis. Appears improved from admission. -Continue ceftriaxone   DVT prophylaxis: SCDs, Lovenox Code Status: DNR Family Communication: None at bedside Disposition Plan: Discharge to SNF   Consultants:   Cardiology  Procedures:   None  Antimicrobials:  Ceftriaxone    Subjective: Dysuria. Otherwise, no complaints.  Objective: Vitals:   10/15/17 2143 10/16/17 0139 10/16/17 0429 10/16/17 0500  BP: (!) 115/52 (!) 98/55 103/85   Pulse: 60 72 64   Resp: 18 18 18    Temp: 97.7 F (36.5 C) 98.1 F (36.7 C) 98.7 F (37.1 C)   TempSrc: Oral Oral Oral   SpO2: 96% 94% 94%   Weight:    91.7 kg (202 lb 2.6 oz)    Intake/Output Summary (Last 24 hours) at 10/16/2017 0956 Last data filed at 10/16/2017 0429 Gross per 24 hour  Intake 240 ml  Output 1600 ml  Net -1360 ml   Filed Weights   10/13/17 1813 10/15/17 1114 10/16/17 0500  Weight: 93.4 kg (205 lb 16 oz) 91.9 kg (202 lb 9.6 oz) 91.7 kg (202 lb 2.6 oz)    Examination:  General exam: Appears calm and comfortable Respiratory system: Clear to auscultation. Respiratory effort normal. Cardiovascular system: S1 & S2 heard, RRR. Systolic murmur Gastrointestinal system: Abdomen is nondistended, soft and nontender. No organomegaly or masses felt. Normal bowel sounds heard. Central nervous system: Alert and oriented. No focal neurological deficits. Extremities: No edema. No calf tenderness Skin: No cyanosis. No rashes Psychiatry: Judgement and insight appear normal. Mood & affect appropriate.  Data Reviewed: I have personally reviewed following labs and imaging studies  CBC: Recent Labs  Lab  10/13/17 0925 10/14/17 0411  WBC 9.5 8.3  NEUTROABS 8.3*  --   HGB 12.7* 11.2*  HCT 38.8* 35.3*  MCV 90.0 89.8  PLT 220 176   Basic Metabolic Panel: Recent Labs  Lab 10/13/17 1052 10/14/17 0411 10/15/17 0832  NA 141 142 144  K 3.0* 3.0* 3.0*  CL 97* 100* 100*  CO2 30 31 36*  GLUCOSE 211* 184* 183*  BUN 54* 51* 48*  CREATININE 1.99* 1.83* 1.78*  CALCIUM 9.1 9.0 9.0   GFR: Estimated Creatinine Clearance: 30.3 mL/min (A) (by C-G formula based on SCr of 1.78 mg/dL (H)). Liver Function Tests: Recent Labs  Lab 10/13/17 1052  AST 32  ALT 13*  ALKPHOS 106  BILITOT 2.0*  PROT 6.9  ALBUMIN 3.4*   No results for input(s): LIPASE, AMYLASE in the last 168 hours. No results for input(s): AMMONIA in the last 168 hours. Coagulation Profile: Recent Labs  Lab 10/13/17 0925  INR 1.22   Cardiac Enzymes: Recent Labs  Lab 10/13/17 1052  CKTOTAL 203   BNP (last 3 results) No results for input(s): PROBNP in the last 8760 hours. HbA1C: Recent Labs    10/14/17 1635  HGBA1C 8.4*   CBG: Recent Labs  Lab 10/15/17 0815 10/15/17 1102 10/15/17 1653 10/15/17 2129 10/16/17 0603  GLUCAP 197* 188* 135* 175* 138*   Lipid Profile: No results for input(s): CHOL, HDL, LDLCALC, TRIG, CHOLHDL, LDLDIRECT in the last 72 hours. Thyroid Function Tests: No results for input(s): TSH, T4TOTAL, FREET4, T3FREE, THYROIDAB in the last 72 hours. Anemia Panel: No results for input(s): VITAMINB12, FOLATE, FERRITIN, TIBC, IRON, RETICCTPCT in the last 72 hours. Sepsis Labs: Recent Labs  Lab 10/13/17 0940 10/13/17 1202 10/15/17 0832  LATICACIDVEN 3.22* 3.38* 1.6    Recent Results (from the past 240 hour(s))  Culture, blood (Routine x 2)     Status: None (Preliminary result)   Collection Time: 10/13/17  9:25 AM  Result Value Ref Range Status   Specimen Description BLOOD LEFT ANTECUBITAL  Final   Special Requests   Final    BOTTLES DRAWN AEROBIC AND ANAEROBIC Blood Culture adequate  volume   Culture NO GROWTH 2 DAYS  Final   Report Status PENDING  Incomplete  Culture, blood (Routine x 2)     Status: None (Preliminary result)   Collection Time: 10/13/17  9:35 AM  Result Value Ref Range Status   Specimen Description BLOOD RIGHT FOREARM  Final   Special Requests   Final    BOTTLES DRAWN AEROBIC ONLY Blood Culture adequate volume   Culture NO GROWTH 2 DAYS  Final   Report Status PENDING  Incomplete  Urine culture     Status: Abnormal   Collection Time: 10/13/17 10:52 AM  Result Value Ref Range Status   Specimen Description URINE, CATHETERIZED  Final   Special Requests NONE  Final   Culture MULTIPLE SPECIES PRESENT, SUGGEST RECOLLECTION (A)  Final   Report Status 10/14/2017 FINAL  Final         Radiology Studies: No results found.      Scheduled Meds: . allopurinol  100 mg Oral Daily  . brinzolamide  1 drop Both Eyes QHS  . docusate sodium  100 mg Oral BID  . enoxaparin (LOVENOX) injection  40 mg Subcutaneous Q24H  . finasteride  5 mg Oral Daily  . furosemide  40 mg Intravenous Q12H  .  furosemide  40 mg Oral BID  . insulin aspart  0-9 Units Subcutaneous TID WC  . iron polysaccharides  150 mg Oral Q breakfast  . metolazone  2.5 mg Oral Q M,W,F  . mirabegron ER  50 mg Oral Daily  . potassium chloride SA  40 mEq Oral Daily  . senna  1 tablet Oral BID  . sodium chloride flush  3 mL Intravenous Q12H  . tadalafil (PAH)  20 mg Oral Daily  . tamsulosin  0.4 mg Oral QPC supper  . Travoprost (BAK Free)  1 drop Both Eyes QHS   Continuous Infusions: . sodium chloride    . cefTRIAXone (ROCEPHIN)  IV Stopped (10/15/17 1952)     LOS: 3 days     Jacquelin Hawking, MD [facility] 10/16/2017, 9:56 AM Pager: [phone]  If 7PM-7AM, please contact night-coverage [email or web address] Password [email or web address] 10/16/2017, 9:56 AM

## 2017-10-16 NOTE — Clinical Social Work Placement (Signed)
Nurse to call report to [phone], Room 601P    CLINICAL SOCIAL WORK PLACEMENT  NOTE  Date:  10/16/2017  Patient Details  Name: [name] [name] MRN: [id number] Date of Birth: 12-25-1924  Clinical Social Work is seeking post-discharge placement for this patient at the Skilled  Nursing Facility level of care (*CSW will initial, date and re-position this form in  chart as items are completed):  Yes   Patient/family provided with [facility] Clinical Social Work Department's list of facilities offering this level of care within the geographic area requested by the patient (or if unable, by the patient's family).  Yes   Patient/family informed of their freedom to choose among providers that offer the needed level of care, that participate in Medicare, Medicaid or managed care program needed by the patient, have an available bed and are willing to accept the patient.  Yes   Patient/family informed of [facility]'s ownership interest in [facility] and [facility], as well as of the fact that they are under no obligation to receive care at these facilities.  PASRR submitted to EDS on 10/15/17     PASRR number received on 10/15/17     Existing PASRR number confirmed on       FL2 transmitted to all facilities in geographic area requested by pt/family on 10/15/17     FL2 transmitted to all facilities within larger geographic area on       Patient informed that his/her managed care company has contracts with or will negotiate with certain facilities, including the following:        Yes   Patient/family informed of bed offers received.  [facility] bed [facility]     Physician recommends and patient chooses bed at      Patient to be [facility] on 10/16/17.  Patient to be transferred to facility by [facility]     Patient family notified on 10/16/17 of transfer.  Name of family member notified:  Aram Beechamynthia     PHYSICIAN       Additional Comment:     _______________________________________________ Baldemar LenisElizabeth M Cadel Stairs, LCSW 10/16/2017, 3:15 PM

## 2017-10-16 NOTE — Discharge Instructions (Signed)
Catheter-Associated Urinary Tract Infection FAQs  What is "catheter-associated urinary tract infection"?  A urinary tract infection (also called "UTI") is an infection in the urinary system, which includes the bladder (which stores the urine) and the kidneys (which filter the blood to make urine). Germs (for example, bacteria or yeasts) do not normally live in these areas; but if germs are introduced, an infection can occur.  If you have a urinary catheter, germs can travel along the catheter and cause an infection in your bladder or your kidney; in that case it is called a catheter-associated urinary tract infection (or "CA-UTI").  What is a urinary catheter?  A urinary catheter is a thin tube placed in the bladder to drain urine. Urine drains through the tube into a bag that collects the urine. A urinary catheter may be used:   If you are not able to urinate on your own   To measure the amount of urine that you make, for example, during intensive care   During and after some types of surgery   During some tests of the kidneys and bladder    People with urinary catheters have a much higher chance of getting a urinary tract infection than people who don't have a catheter.  How do I get a catheter-associated urinary tract infection (CA-UTI)?  If germs enter the urinary tract, they may cause an infection. Many of the germs that cause a catheter-associated urinary tract infection are common germs found in your intestines that do not usually cause an infection there. Germs can enter the urinary tract when the catheter is being put in or while the catheter remains in the bladder.  What are the symptoms of a urinary tract infection?  Some of the common symptoms of a urinary tract infection are:   Burning or pain in the lower abdomen (that is, below the stomach)   Fever   Bloody urine may be a sign of infection, but is also caused by other problems   Burning during urination or an increase in the frequency of  urination after the catheter is removed.    Sometimes people with catheter-associated urinary tract infections do not have these symptoms of infection.  Can catheter-associated urinary tract infections be treated?  Yes, most catheter-associated urinary tract infections can be treated with antibiotics and removal or change of the catheter. Your doctor will determine which antibiotic is best for you.  What are some of the things that hospitals are doing to prevent catheter-associated urinary tract infections?  To prevent urinary tract infections, doctors and nurses take the following actions.  Catheter insertion   Catheters are put in only when necessary and they are removed as soon as possible.   Only properly trained persons insert catheters using sterile ("clean") technique.   The skin in the area where the catheter will be inserted is cleaned before inserting the catheter.   Other methods to drain the urine are sometimes used, such as:  ? External catheters in men (these look like condoms and are placed over the penis rather than into the penis)  ? Putting a temporary catheter in to drain the urine and removing it right away. This is called intermittent urethral catheterization.    Catheter care   Healthcare providers clean their hands by washing them with soap and water or using an alcohol-based hand rub before and after touching your catheter.   If you do not see your providers clean their hands, please ask them to do so.     Avoid disconnecting the catheter and drain tube. This helps to prevent germs from getting into the catheter tube.   The catheter is secured to the leg to prevent pulling on the catheter.   Avoid twisting or kinking the catheter.   Keep the bag lower than the bladder to prevent urine from backflowing to the bladder.   Empty the bag regularly. The drainage spout should not touch anything while emptying the bag.    What can I do to help prevent catheter-associated urinary tract  infections if I have a catheter?   Always clean your hands before and after doing catheter care.   Always keep your urine bag below the level of your bladder.   Do not tug or pull on the tubing.   Do not twist or kink the catheter tubing.   Ask your healthcare provider each day if you still need the catheter.    What do I need to do when I go home from the hospital?   If you will be going home with a catheter, your doctor or nurse should explain everything you need to know about taking care of the catheter. Make sure you understand how to care for it before you leave the hospital.   If you develop any of the symptoms of a urinary tract infection, such as burning or pain in the lower abdomen, fever, or an increase in the frequency of urination, contact your doctor or nurse immediately.   Before you go home, make sure you know who to contact if you have questions or problems after you get home.  If you have questions, please ask your doctor or nurse.  Developed and co-sponsored by The Society for Healthcare Epidemiology of America (SHEA); Infectious Diseases Society of America (IDSA); American Hospital Association; Association for Professionals in Infection Control and Epidemiology (APIC); Centers for Disease Control and Prevention (CDC); and The Joint Commission.  This information is not intended to replace advice given to you by your health care provider. Make sure you discuss any questions you have with your health care provider.  Document Released: 07/11/2012 Document Revised: 03/30/2016 Document Reviewed: 12/31/2014  [employer]vier Interactive Patient Education  2018 [employer]

## 2017-10-16 NOTE — Discharge Summary (Signed)
Physician Discharge Summary  [name] [name] ZOX:[id number] DOB: 1925-04-08 DOA: 10/13/2017  PCP: Gaspar Garbe, MD  Admit date: 10/13/2017 Discharge date: 10/16/2017  Admitted From: SNF Disposition: SNF  Recommendations for Outpatient Follow-up:  1. Follow up with PCP in 1 week 2. Please obtain BMP in 2-3 days to recheck potassium 3. Vantin end date of 12/24 4. Please follow up on the following pending results: None  Home Health: SNF Equipment/Devices: Foley  Discharge Condition: Stable CODE STATUS: DNR Diet recommendation: Heart healthy/carb modified   Brief/Interim Summary:  Admission HPI written by Shon Hale, MD   HPI:   [name] [name]  is a 83 y.o. male with past medical history relevant for, status post [employer] loop recorder placement, stage III CKD, urinary retention with chronic indwelling Foley diabetes mellitus, chronic diastolic CHF hypertension who presents to the ED after being found on the ground status post fall. 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  patient was last seen normal yesterday morning , yesterday evening around 4:00 pm there was some mild confusion when the CNA came back into the house this morning found patient on the floor with his pants off and  with double underwear on ED provider discussed case with cardiologist,  Coral Else (cardiology) reviewed her loop recorder info without significant abnormalities noted  In ED patient has increasing shortness of breath and increasing work of breathing, , chest x-ray suggested pulmonary edema and pleural effusions, Patient wt is up to 206 pounds from 163 pounds, was given Lasix for diuresis, last known EF based on echo from August 2018 was 55-60%  Patient had no productive cough, no vomiting, he was apparently incontinent of formed stool.     Hospital  course:  Acute metabolic encephalopathy Likely secondary to UTI. Resolved. Treated infections as mentioned below.  Catheter associated UTI Clinical diagnosis. Urine culture significant for multiple species. Patient improving on current antibiotic regimen. Still with some dysuria. Foley changed prior to discharge. Continue for total 10 day course as an outpatient.  Acute kidney injury on CKD stage 3 Baseline of around 1.3. 1.99 on admision. Trended down with diuresis. Repeat BMP in 2-3 days to check if back to baseline now that patient taking nutrition orally.  Diabetes mellitus Patient on Amaryl as an outpatient. Last A1C of 6.2%. Recommend goal of less than 7.5%-8% secondary to patient's age and to lessen risk of life threatening hypoglycemia and sequela from hypoglycemia. Hemoglobin A1C is now 8.4%. Started Januvia on discharge (dose adjusted for renal function).  Chronic diastolic heart failure Cardiology evaluated and patient is at baseline. Continued Lasix and metolazone.  Hypokalemia Secondary to diuresis given supplementation. Potassium on discharge. Recheck BMP in 2-3 days.  BPH Patient with a chronic foley. Replaced prior to discharge. Outpatient urology follow-up recommended.  Pulmonary artery hypertension Continued tadalafil  Paroxysmal atrial fibrillation Stable. No events on loop recorder.  Cellulitis Infection vs chronic dermatitis. Appears improved from admission. Ceftriaxone while inpatient, transitioned to [facility] on discharge. End date 12/24.    Discharge Diagnoses:  Principal Problem:   Acute metabolic encephalopathy/UTI/AKI/Cellulitis Active Problems:   Hypertension   Acute on chronic diastolic heart failure (HCC)   Cellulitis and abscess of Bil leg/Rt > Lt   UTI (urinary tract infection)   Acute-on-chronic kidney injury [facility])   Encephalopathy    Discharge Instructions  Discharge Instructions    Call MD for:  extreme fatigue   Complete by:   As directed    Call MD for:  persistant dizziness or light-headedness   Complete by:  As directed    Call MD for:  persistant nausea and vomiting   Complete by:  As directed    Call MD for:  severe uncontrolled pain   Complete by:  As directed    Call MD for:  temperature >100.4   Complete by:  As directed    Diet - low sodium heart healthy   Complete by:  As directed    Increase activity slowly   Complete by:  As directed      Allergies as of 10/16/2017   No Known Allergies     Medication List    STOP taking these medications   tadalafil 20 MG tablet Commonly known as:  CIALIS     TAKE these medications   allopurinol 100 MG tablet Commonly known as:  ZYLOPRIM Take 100 mg by mouth daily.   aspirin EC 325 MG tablet Take 325 mg by mouth daily.   brinzolamide 1 % ophthalmic suspension Commonly known as:  AZOPT Place 1 drop into both eyes at bedtime.   cefpodoxime 200 MG tablet Commonly known as:  VANTIN Take 1 tablet (200 mg total) by mouth daily.   finasteride 5 MG tablet Commonly known as:  PROSCAR Take 5 mg by mouth daily.   furosemide 40 MG tablet Commonly known as:  LASIX Take 1 tablet (40 mg total) by mouth 2 (two) times daily. What changed:    how much to take  when to take this  additional instructions   metolazone 2.5 MG tablet Commonly known as:  ZAROXOLYN Take 2.5 mg by mouth every other day. Filled 09-20-17   MYRBETRIQ 50 MG Tb24 tablet Generic drug:  mirabegron ER Take 50 mg by mouth daily.   polyethylene glycol packet Commonly known as:  MIRALAX / GLYCOLAX Take 17 g by mouth daily as needed for mild constipation.   potassium chloride SA 20 MEQ tablet Commonly known as:  K-DUR,KLOR-CON Take 40 mEq by mouth 2 (two) times daily.   RAPAFLO 8 MG Caps capsule Generic drug:  silodosin Take 8 mg by mouth daily.   sitaGLIPtin 25 MG tablet Commonly known as:  JANUVIA Take 1 tablet (25 mg total) by mouth daily.   tadalafil (PAH) 20 MG  tablet Commonly known as:  ADCIRCA Take 1 tablet (20 mg total) by mouth daily. Start taking on:  10/17/2017   travoprost (benzalkonium) 0.004 % ophthalmic solution Commonly known as:  TRAVATAN Place 1 drop into both eyes at bedtime.      Follow-up Information    Tisovec, Adelfa Kohichard W, MD. Schedule an appointment as soon as possible for a visit in 2 week(s).   Specialty:  Internal Medicine Contact information: [address] [place] [address] [phone]          No Known Allergies  Consultations:  Cardiology   Procedures/Studies: Ct Head Wo Contrast  Result Date: 10/13/2017 CLINICAL DATA:  Altered mental status. EXAM: CT HEAD WITHOUT CONTRAST TECHNIQUE: Contiguous axial images were obtained from the base of the skull through the vertex without intravenous contrast. COMPARISON:  06/20/2017 FINDINGS: Brain: Stable age related cerebral atrophy, ventriculomegaly and periventricular white matter disease. No extra-axial fluid collections are identified. No CT findings for acute hemispheric infarction or intracranial hemorrhage. No mass lesions. The brainstem and cerebellum are normal. Vascular: Stable dense vascular calcifications. No hyperdense vessel or unexpected calcification. Skull:  No fracture or bone lesion. Sinuses/Orbits: The paranasal sinuses and mastoid air cells are clear except for minimal areas of mucoperiosteal thickening. The globes are intact. Other: No scalp lesion or hematoma. IMPRESSION: Stable age related cerebral atrophy, ventriculomegaly and periventricular white matter disease. No acute intracranial findings. Electronically Signed   By: Rudie Meyer M.D.   On: 10/13/2017 13:10   Dg Chest Portable 1 View  Result Date: 10/13/2017 CLINICAL DATA:  Patient found on the floor with decreased level of consciousness. Wheezing. EXAM: PORTABLE CHEST 1 VIEW COMPARISON:  07/24/2017 FINDINGS: Cardiomediastinal silhouette is normal. Mediastinal contours appear intact.  Calcific atherosclerotic disease of the aorta. There is no evidence of pneumothorax. Low lung volumes. Prominence of the interstitial markings. Left pleural effusion. Left lower lobe airspace consolidation versus atelectasis. Osseous structures are without acute abnormality. Soft tissues are grossly normal. IMPRESSION: Low lung volumes with mild interstitial pulmonary edema. Left pleural effusion with left lower lobe atelectasis versus airspace consolidation. Electronically Signed   By: Ted Mcalpine M.D.   On: 10/13/2017 09:48     Subjective: Some dysuria today.  Discharge Exam: Vitals:   10/16/17 1408 10/16/17 1449  BP:  127/64  Pulse:  65  Resp:  18  Temp:  98.1 F (36.7 C)  SpO2: 95% 96%   Vitals:   10/16/17 0500 10/16/17 0957 10/16/17 1408 10/16/17 1449  BP:  109/63  127/64  Pulse:  61  65  Resp:  18  18  Temp:  97.9 F (36.6 C)  98.1 F (36.7 C)  TempSrc:  Oral  Oral  SpO2:  95% 95% 96%  Weight: 91.7 kg (202 lb 2.6 oz)       General exam: Appears calm and comfortable Respiratory system: Clear to auscultation. Respiratory effort normal. Cardiovascular system: S1 & S2 heard, RRR. Systolic murmur Gastrointestinal system: Abdomen is nondistended, soft and nontender. No organomegaly or masses felt. Normal bowel sounds heard. Central nervous system: Alert and oriented. No focal neurological deficits. Extremities: No edema. No calf tenderness Skin: No cyanosis. No rashes Psychiatry: Judgement and insight appear normal. Mood & affect appropriate.    The results of significant diagnostics from this hospitalization (including imaging, microbiology, ancillary and laboratory) are listed below for reference.     Microbiology: Recent Results (from the past 240 hour(s))  Culture, blood (Routine x 2)     Status: None (Preliminary result)   Collection Time: 10/13/17  9:25 AM  Result Value Ref Range Status   Specimen Description BLOOD LEFT ANTECUBITAL  Final   Special  Requests   Final    BOTTLES DRAWN AEROBIC AND ANAEROBIC Blood Culture adequate volume   Culture NO GROWTH 3 DAYS  Final   Report Status PENDING  Incomplete  Culture, blood (Routine x 2)     Status: None (Preliminary result)   Collection Time: 10/13/17  9:35 AM  Result Value Ref Range Status   Specimen Description BLOOD RIGHT FOREARM  Final   Special Requests   Final    BOTTLES DRAWN AEROBIC ONLY Blood Culture adequate volume   Culture NO GROWTH 3 DAYS  Final   Report Status PENDING  Incomplete  Urine culture     Status: Abnormal   Collection Time: 10/13/17 10:52 AM  Result Value Ref Range Status   Specimen Description URINE, CATHETERIZED  Final   Special Requests NONE  Final   Culture MULTIPLE SPECIES PRESENT, SUGGEST RECOLLECTION (A)  Final   Report Status 10/14/2017 FINAL  Final     Labs:  BNP (last 3 results) Recent Labs    03/18/17 0416 07/24/17 1432 10/13/17 0925  BNP 734.5* 482.5* 417.9*   Basic Metabolic Panel: Recent Labs  Lab 10/13/17 1052 10/14/17 0411 10/15/17 0832  NA 141 142 144  K 3.0* 3.0* 3.0*  CL 97* 100* 100*  CO2 30 31 36*  GLUCOSE 211* 184* 183*  BUN 54* 51* 48*  CREATININE 1.99* 1.83* 1.78*  CALCIUM 9.1 9.0 9.0   Liver Function Tests: Recent Labs  Lab 10/13/17 1052  AST 32  ALT 13*  ALKPHOS 106  BILITOT 2.0*  PROT 6.9  ALBUMIN 3.4*   No results for input(s): LIPASE, AMYLASE in the last 168 hours. No results for input(s): AMMONIA in the last 168 hours. CBC: Recent Labs  Lab 10/13/17 0925 10/14/17 0411  WBC 9.5 8.3  NEUTROABS 8.3*  --   HGB 12.7* 11.2*  HCT 38.8* 35.3*  MCV 90.0 89.8  PLT 220 176   Cardiac Enzymes: Recent Labs  Lab 10/13/17 1052  CKTOTAL 203   BNP: Invalid input(s): POCBNP CBG: Recent Labs  Lab 10/15/17 1102 10/15/17 1653 10/15/17 2129 10/16/17 0603 10/16/17 1132  GLUCAP 188* 135* 175* 138* 222*   D-Dimer No results for input(s): DDIMER in the last 72 hours. Hgb A1c Recent Labs     10/14/17 1635  HGBA1C 8.4*   Lipid Profile No results for input(s): CHOL, HDL, LDLCALC, TRIG, CHOLHDL, LDLDIRECT in the last 72 hours. Thyroid function studies No results for input(s): TSH, T4TOTAL, T3FREE, THYROIDAB in the last 72 hours.  Invalid input(s): FREET3 Anemia work up No results for input(s): VITAMINB12, FOLATE, FERRITIN, TIBC, IRON, RETICCTPCT in the last 72 hours. Urinalysis    Component Value Date/Time   COLORURINE YELLOW 10/13/2017 1052   APPEARANCEUR HAZY (A) 10/13/2017 1052   LABSPEC 1.013 10/13/2017 1052   PHURINE 5.0 10/13/2017 1052   GLUCOSEU NEGATIVE 10/13/2017 1052   HGBUR LARGE (A) 10/13/2017 1052   BILIRUBINUR NEGATIVE 10/13/2017 1052   KETONESUR NEGATIVE 10/13/2017 1052   PROTEINUR 30 (A) 10/13/2017 1052   NITRITE NEGATIVE 10/13/2017 1052   LEUKOCYTESUR LARGE (A) 10/13/2017 1052   Sepsis Labs Invalid input(s): PROCALCITONIN,  WBC,  LACTICIDVEN Microbiology Recent Results (from the past 240 hour(s))  Culture, blood (Routine x 2)     Status: None (Preliminary result)   Collection Time: 10/13/17  9:25 AM  Result Value Ref Range Status   Specimen Description BLOOD LEFT ANTECUBITAL  Final   Special Requests   Final    BOTTLES DRAWN AEROBIC AND ANAEROBIC Blood Culture adequate volume   Culture NO GROWTH 3 DAYS  Final   Report Status PENDING  Incomplete  Culture, blood (Routine x 2)     Status: None (Preliminary result)   Collection Time: 10/13/17  9:35 AM  Result Value Ref Range Status   Specimen Description BLOOD RIGHT FOREARM  Final   Special Requests   Final    BOTTLES DRAWN AEROBIC ONLY Blood Culture adequate volume   Culture NO GROWTH 3 DAYS  Final   Report Status PENDING  Incomplete  Urine culture     Status: Abnormal   Collection Time: 10/13/17 10:52 AM  Result Value Ref Range Status   Specimen Description URINE, CATHETERIZED  Final   Special Requests NONE  Final   Culture MULTIPLE SPECIES PRESENT, SUGGEST RECOLLECTION (A)  Final   Report  Status 10/14/2017 FINAL  Final     Time coordinating discharge: Over 30 minutes  SIGNED:   Jacquelin Hawkingalph Trannie Bardales, MD [facility] 10/16/2017, 2:53 PM Pager [phone]  If 7PM-7AM, please contact night-coverage [email or web address] Password [email or web address]

## 2017-10-16 NOTE — Clinical Social Work Note (Signed)
Clinical Social Work Assessment  Patient Details  Name: [name] [name] MRN: [id number] Date of Birth: 12/05/1924  Date of referral:  10/16/17               Reason for consult:  Facility Placement                Permission sought to share information with:  Facility Medical sales representativeContact Representative, Family Supports Permission granted to share information::  Yes, Verbal Permission Granted  Name::     HaematologistCynthia  Agency::  SNF  Relationship::  Daughter  Contact Information:     Housing/Transportation Living arrangements for the past 2 months:  Single Family Home Source of Information:  Adult Children Patient Interpreter Needed:  None Criminal Activity/Legal Involvement Pertinent to Current Situation/Hospitalization:  No - Comment as needed Significant Relationships:  Adult Children, Neighbor Lives with:  Self Do you feel safe going back to the place where you live?  Yes Need for family participation in patient care:  Yes (Comment)(patient only oriented to self)  Care giving concerns:  Patient currently lives at home alone and has been having frequent falls; would benefit from short term rehab at discharge prior to returning home with caregiving support.   Social Worker assessment / plan:  CSW spoke with patient's daughter, [name], over the phone. CSW explained recommendation for SNF, and the reasons why it's being recommended. CSW discussed facility options and referral process. CSW discussed insurance coverage. CSW completed referral and faxed out to facilities. CSW will continue to follow.  Employment status:  Retired Health and safety inspectornsurance information:  Medicare PT Recommendations:  Skilled Nursing Facility Information / Referral to community resources:  Skilled Nursing Facility  Patient/Family's Response to care:  Patient's daughter agreeable to SNF placement. Patient's daughter is understanding that she will also need to set up 24 hour caregiving for the patient when he returns home, because he isn't  safe to be alone anymore.  Patient/Family's Understanding of and Emotional Response to Diagnosis, Current Treatment, and Prognosis:  Patient's daughter was wondering why the patient was being recommended for SNF placement, but confirmed that the patient had been falling a lot recently and she was agreeable. Patient's daughter discussed how if he could be placed at [facility], that would be close to home and his friends and family could go visit him to make it easier. Patient's daughter acknowledged that the cardiologist had discussed with her how he would likely need 24 hour care upon return home, that he wasn't safe to be alone anymore, and she said that going for SNF would give her some additional time to set that up for him.  Emotional Assessment Appearance:  Appears stated age Attitude/Demeanor/Rapport:  Unable to Assess Affect (typically observed):  Unable to Assess Orientation:  Oriented to Self Alcohol / Substance use:  Not Applicable Psych involvement (Current and /or in the community):  No (Comment)  Discharge Needs  Concerns to be addressed:  Care Coordination Readmission within the last 30 days:  No Current discharge risk:  Lives alone, Physical Impairment, Cognitively Impaired, Dependent with Mobility Barriers to Discharge:  Continued Medical Work up   Dollar GeneralElizabeth M Hudsyn Champine, LCSW 10/16/2017, 9:51 AM

## 2017-10-16 NOTE — [place] FL2 (Signed)
[place] MEDICAID FL2 LEVEL OF CARE SCREENING TOOL     IDENTIFICATION  Patient Name: [name] [name] Birthdate: January 25, 1925 Sex: male Admission Date (Current Location): 10/13/2017  [facility] and [place] Number:  Producer, television/film/video and Address:  [facility], [address] [place], [place] [address]      Provider Number: [id number]  Attending Physician Name and Address:  Narda Bonds, MD  Relative Name and Phone Number:       Current Level of Care: Hospital Recommended Level of Care: Skilled Nursing Facility Prior Approval Number:    Date Approved/Denied:   PASRR Number: [id number]  Discharge Plan: SNF    Current Diagnoses: Patient Active Problem List   Diagnosis Date Noted  . Cellulitis and abscess of Bil leg/Rt > Lt 10/13/2017  . UTI (urinary tract infection) 10/13/2017  . Acute metabolic encephalopathy/UTI/AKI/Cellulitis 10/13/2017  . Acute-on-chronic kidney injury (HCC) 10/13/2017  . Encephalopathy 10/13/2017  . Hypokalemia 07/24/2017  . Closed intertrochanteric fracture of left femur (HCC)   . Bradycardia   . Closed left hip fracture, initial encounter (HCC) 06/21/2017  . Hypotension 06/21/2017  . Syncope 06/21/2017  . Iron deficiency anemia 06/21/2017  . Elevated troponin 06/21/2017  . Closed fracture of left hip (HCC)   . Acute on chronic diastolic heart failure (HCC) 12/01/2016  . Atrial fibrillation (HCC) 02/24/2016  . Bilateral leg weakness 09/17/2015  . Chest pain   . DM (diabetes mellitus), type 2 with renal complications (HCC)   . Hypertension   . Hyperlipidemia   . Prostatic hypertrophy, benign, with obstruction   . Sick sinus syndrome (HCC)   . Obesity   . Microalbuminuria   . Glaucoma   . Renal calculus   . Osteoarthritis   . Chronic kidney disease, stage III (moderate) (HCC)   . BPH with elevated PSA   . ED (erectile dysfunction)   . RECTAL BLEEDING 04/22/2008  . DYSPHAGIA 04/22/2008  . HYPERLIPIDEMIA  04/18/2008  . HTN (hypertension) 04/18/2008  . ESOPHAGEAL STRICTURE 04/18/2008  . GERD 04/18/2008  . HIATAL HERNIA 04/18/2008  . DIVERTICULOSIS, COLON 04/18/2008  . COLONIC POLYPS, HYPERPLASTIC, HX OF 04/18/2008    Orientation RESPIRATION BLADDER Height & Weight     Self  O2([place] 1.5L) Incontinent, External catheter(catheter placed 09/13/17) Weight: 202 lb 2.6 oz (91.7 kg) Height:     BEHAVIORAL SYMPTOMS/MOOD NEUROLOGICAL BOWEL NUTRITION STATUS      Continent Diet(heart healthy, carb modified)  AMBULATORY STATUS COMMUNICATION OF NEEDS Skin   Limited Assist   Skin abrasions                       Personal Care Assistance Level of Assistance  Bathing, Feeding, Dressing Bathing Assistance: Limited assistance Feeding assistance: Independent Dressing Assistance: Limited assistance     Functional Limitations Info  Sight, Hearing, Speech Sight Info: Adequate Hearing Info: Adequate Speech Info: Adequate    SPECIAL CARE FACTORS FREQUENCY  PT (By licensed PT), OT (By licensed OT)     PT Frequency: 5x/wk OT Frequency: 5x/wk            Contractures Contractures Info: Not present    Additional Factors Info  Code Status, Allergies, Insulin Sliding Scale Code Status Info: DNR Allergies Info: NKA   Insulin Sliding Scale Info: 0-9 Units 3x/day with meals       Current Medications (10/16/2017):  This is the current hospital active medication list Current Facility-Administered Medications  Medication Dose Route Frequency Provider  Last Rate Last Dose  . 0.9 %  sodium chloride infusion  250 mL Intravenous PRN Emokpae, Courage, MD      . acetaminophen (TYLENOL) tablet 650 mg  650 mg Oral Q4H PRN Emokpae, Courage, MD      . albuterol (PROVENTIL) (2.5 MG/3ML) 0.083% nebulizer solution 2.5 mg  2.5 mg Nebulization Q2H PRN Emokpae, Courage, MD      . allopurinol (ZYLOPRIM) tablet 100 mg  100 mg Oral Daily Emokpae, Courage, MD   100 mg at 10/16/17 0933  . brinzolamide (AZOPT) 1 %  ophthalmic suspension 1 drop  1 drop Both Eyes QHS Emokpae, Courage, MD   1 drop at 10/15/17 2139  . cefTRIAXone (ROCEPHIN) 1 g in dextrose 5 % 50 mL IVPB  1 g Intravenous Q24H Shon HaleEmokpae, Courage, MD   Stopped at 10/15/17 1952  . docusate sodium (COLACE) capsule 100 mg  100 mg Oral BID Shon HaleEmokpae, Courage, MD   100 mg at 10/16/17 0933  . enoxaparin (LOVENOX) injection 40 mg  40 mg Subcutaneous Q24H Narda BondsNettey, Ralph A, MD   40 mg at 10/15/17 1207  . finasteride (PROSCAR) tablet 5 mg  5 mg Oral Daily Emokpae, Courage, MD   5 mg at 10/16/17 0934  . furosemide (LASIX) injection 40 mg  40 mg Intravenous Q12H Emokpae, Courage, MD   40 mg at 10/15/17 2241  . furosemide (LASIX) tablet 40 mg  40 mg Oral BID Patwardhan, Manish J, MD   40 mg at 10/16/17 0934  . HYDROcodone-acetaminophen (NORCO/VICODIN) 5-325 MG per tablet 1 tablet  1 tablet Oral Q6H PRN Emokpae, Courage, MD      . insulin aspart (novoLOG) injection 0-9 Units  0-9 Units Subcutaneous TID WC Shon HaleEmokpae, Courage, MD   1 Units at 10/16/17 0702  . iron polysaccharides (NIFEREX) capsule 150 mg  150 mg Oral Q breakfast Mariea ClontsEmokpae, Courage, MD   150 mg at 10/16/17 0934  . metolazone (ZAROXOLYN) tablet 2.5 mg  2.5 mg Oral Q M,W,F Emokpae, Courage, MD   2.5 mg at 10/16/17 0935  . mirabegron ER (MYRBETRIQ) tablet 50 mg  50 mg Oral Daily Emokpae, Courage, MD   50 mg at 10/16/17 0934  . nitroGLYCERIN (NITROSTAT) SL tablet 0.4 mg  0.4 mg Sublingual Q5 min PRN Emokpae, Courage, MD      . ondansetron (ZOFRAN) tablet 4 mg  4 mg Oral Q6H PRN Emokpae, Courage, MD       Or  . ondansetron (ZOFRAN) injection 4 mg  4 mg Intravenous Q6H PRN Emokpae, Courage, MD      . polyethylene glycol (MIRALAX / GLYCOLAX) packet 17 g  17 g Oral Daily PRN Emokpae, Courage, MD      . potassium chloride SA (K-DUR,KLOR-CON) CR tablet 40 mEq  40 mEq Oral Daily Emokpae, Courage, MD   40 mEq at 10/16/17 0934  . senna (SENOKOT) tablet 8.6 mg  1 tablet Oral BID Mariea ClontsEmokpae, Courage, MD   8.6 mg at 10/16/17  0934  . sodium chloride flush (NS) 0.9 % injection 3 mL  3 mL Intravenous Q12H Shon HaleEmokpae, Courage, MD   3 mL at 10/16/17 0937  . sodium chloride flush (NS) 0.9 % injection 3 mL  3 mL Intravenous PRN Emokpae, Courage, MD      . tadalafil (PAH) (ADCIRCA) tablet 20 mg  20 mg Oral Daily Patwardhan, Manish J, MD   20 mg at 10/16/17 0934  . tamsulosin (FLOMAX) capsule 0.4 mg  0.4 mg Oral QPC supper Shon HaleEmokpae, Courage, MD  0.4 mg at 10/15/17 1850  . Travoprost (BAK Free) (TRAVATAN) 0.004 % ophthalmic solution SOLN 1 drop  1 drop Both Eyes QHS Emokpae, Courage, MD   1 drop at 10/15/17 2139  . traZODone (DESYREL) tablet 50 mg  50 mg Oral QHS PRN Shon HaleEmokpae, Courage, MD         Discharge Medications: Please see discharge summary for a list of discharge medications.  Relevant Imaging Results:  Relevant Lab Results:   Additional Information SS#: [id number]  Baldemar LenisElizabeth M Tlaloc Taddei, LCSW

## 2017-10-18 ENCOUNTER — Encounter: Payer: Medicare Other | Admitting: Internal Medicine

## 2017-10-18 LAB — CULTURE, BLOOD (ROUTINE X 2)
CULTURE: NO GROWTH
Culture: NO GROWTH
SPECIAL REQUESTS: ADEQUATE
Special Requests: ADEQUATE

## 2017-10-25 DIAGNOSIS — K4090 Unilateral inguinal hernia, without obstruction or gangrene, not specified as recurrent: Secondary | ICD-10-CM | POA: Diagnosis not present

## 2017-10-25 DIAGNOSIS — Z95818 Presence of other cardiac implants and grafts: Secondary | ICD-10-CM | POA: Diagnosis not present

## 2017-10-25 DIAGNOSIS — Z4509 Encounter for adjustment and management of other cardiac device: Secondary | ICD-10-CM | POA: Diagnosis not present

## 2017-10-25 DIAGNOSIS — I509 Heart failure, unspecified: Secondary | ICD-10-CM | POA: Diagnosis not present

## 2017-10-25 DIAGNOSIS — N390 Urinary tract infection, site not specified: Secondary | ICD-10-CM | POA: Diagnosis not present

## 2017-10-25 DIAGNOSIS — R55 Syncope and collapse: Secondary | ICD-10-CM | POA: Diagnosis not present

## 2017-10-27 DIAGNOSIS — E119 Type 2 diabetes mellitus without complications: Secondary | ICD-10-CM | POA: Diagnosis not present

## 2017-10-27 DIAGNOSIS — N183 Chronic kidney disease, stage 3 (moderate): Secondary | ICD-10-CM | POA: Diagnosis not present

## 2017-10-27 DIAGNOSIS — I509 Heart failure, unspecified: Secondary | ICD-10-CM | POA: Diagnosis not present

## 2017-10-27 DIAGNOSIS — Z7409 Other reduced mobility: Secondary | ICD-10-CM | POA: Diagnosis not present

## 2017-11-03 DIAGNOSIS — N4889 Other specified disorders of penis: Secondary | ICD-10-CM | POA: Diagnosis not present

## 2017-11-09 DIAGNOSIS — N400 Enlarged prostate without lower urinary tract symptoms: Secondary | ICD-10-CM | POA: Diagnosis not present

## 2017-11-09 DIAGNOSIS — Z7409 Other reduced mobility: Secondary | ICD-10-CM | POA: Diagnosis not present

## 2017-11-10 ENCOUNTER — Encounter (HOSPITAL_COMMUNITY): Payer: Self-pay | Admitting: Emergency Medicine

## 2017-11-10 ENCOUNTER — Inpatient Hospital Stay (HOSPITAL_COMMUNITY)
Admission: EM | Admit: 2017-11-10 | Discharge: 2017-11-20 | DRG: 291 | Disposition: A | Discharge status: Home Health | Payer: Medicare Other | Attending: Internal Medicine | Admitting: Internal Medicine

## 2017-11-10 ENCOUNTER — Emergency Department (HOSPITAL_COMMUNITY): Payer: Medicare Other

## 2017-11-10 DIAGNOSIS — L03115 Cellulitis of right lower limb: Secondary | ICD-10-CM

## 2017-11-10 DIAGNOSIS — N179 Acute kidney failure, unspecified: Secondary | ICD-10-CM | POA: Diagnosis present

## 2017-11-10 DIAGNOSIS — Z66 Do not resuscitate: Secondary | ICD-10-CM | POA: Diagnosis present

## 2017-11-10 DIAGNOSIS — E1122 Type 2 diabetes mellitus with diabetic chronic kidney disease: Secondary | ICD-10-CM | POA: Diagnosis present

## 2017-11-10 DIAGNOSIS — I442 Atrioventricular block, complete: Secondary | ICD-10-CM | POA: Diagnosis present

## 2017-11-10 DIAGNOSIS — J441 Chronic obstructive pulmonary disease with (acute) exacerbation: Secondary | ICD-10-CM | POA: Diagnosis not present

## 2017-11-10 DIAGNOSIS — I130 Hypertensive heart and chronic kidney disease with heart failure and stage 1 through stage 4 chronic kidney disease, or unspecified chronic kidney disease: Secondary | ICD-10-CM | POA: Diagnosis present

## 2017-11-10 DIAGNOSIS — Z466 Encounter for fitting and adjustment of urinary device: Secondary | ICD-10-CM | POA: Diagnosis not present

## 2017-11-10 DIAGNOSIS — E785 Hyperlipidemia, unspecified: Secondary | ICD-10-CM | POA: Diagnosis present

## 2017-11-10 DIAGNOSIS — N492 Inflammatory disorders of scrotum: Secondary | ICD-10-CM

## 2017-11-10 DIAGNOSIS — Z9981 Dependence on supplemental oxygen: Secondary | ICD-10-CM | POA: Diagnosis not present

## 2017-11-10 DIAGNOSIS — I5033 Acute on chronic diastolic (congestive) heart failure: Secondary | ICD-10-CM | POA: Diagnosis not present

## 2017-11-10 DIAGNOSIS — F0390 Unspecified dementia without behavioral disturbance: Secondary | ICD-10-CM | POA: Diagnosis present

## 2017-11-10 DIAGNOSIS — I5032 Chronic diastolic (congestive) heart failure: Secondary | ICD-10-CM | POA: Diagnosis not present

## 2017-11-10 DIAGNOSIS — J189 Pneumonia, unspecified organism: Secondary | ICD-10-CM | POA: Diagnosis not present

## 2017-11-10 DIAGNOSIS — I2721 Secondary pulmonary arterial hypertension: Secondary | ICD-10-CM | POA: Diagnosis present

## 2017-11-10 DIAGNOSIS — Y95 Nosocomial condition: Secondary | ICD-10-CM | POA: Diagnosis present

## 2017-11-10 DIAGNOSIS — R0602 Shortness of breath: Secondary | ICD-10-CM | POA: Diagnosis not present

## 2017-11-10 DIAGNOSIS — R32 Unspecified urinary incontinence: Secondary | ICD-10-CM | POA: Diagnosis not present

## 2017-11-10 DIAGNOSIS — Z7189 Other specified counseling: Secondary | ICD-10-CM

## 2017-11-10 DIAGNOSIS — I480 Paroxysmal atrial fibrillation: Secondary | ICD-10-CM | POA: Diagnosis present

## 2017-11-10 DIAGNOSIS — Z79899 Other long term (current) drug therapy: Secondary | ICD-10-CM | POA: Diagnosis not present

## 2017-11-10 DIAGNOSIS — Z87891 Personal history of nicotine dependence: Secondary | ICD-10-CM

## 2017-11-10 DIAGNOSIS — N183 Chronic kidney disease, stage 3 unspecified: Secondary | ICD-10-CM | POA: Diagnosis present

## 2017-11-10 DIAGNOSIS — I482 Chronic atrial fibrillation: Secondary | ICD-10-CM

## 2017-11-10 DIAGNOSIS — J90 Pleural effusion, not elsewhere classified: Secondary | ICD-10-CM | POA: Diagnosis not present

## 2017-11-10 DIAGNOSIS — I10 Essential (primary) hypertension: Secondary | ICD-10-CM | POA: Diagnosis not present

## 2017-11-10 DIAGNOSIS — S50811D Abrasion of right forearm, subsequent encounter: Secondary | ICD-10-CM | POA: Diagnosis not present

## 2017-11-10 DIAGNOSIS — L03116 Cellulitis of left lower limb: Secondary | ICD-10-CM | POA: Diagnosis not present

## 2017-11-10 DIAGNOSIS — I5040 Unspecified combined systolic (congestive) and diastolic (congestive) heart failure: Secondary | ICD-10-CM | POA: Diagnosis not present

## 2017-11-10 DIAGNOSIS — I350 Nonrheumatic aortic (valve) stenosis: Secondary | ICD-10-CM | POA: Diagnosis not present

## 2017-11-10 DIAGNOSIS — R509 Fever, unspecified: Secondary | ICD-10-CM | POA: Diagnosis not present

## 2017-11-10 DIAGNOSIS — M6281 Muscle weakness (generalized): Secondary | ICD-10-CM | POA: Diagnosis not present

## 2017-11-10 DIAGNOSIS — Z515 Encounter for palliative care: Secondary | ICD-10-CM

## 2017-11-10 DIAGNOSIS — R0600 Dyspnea, unspecified: Secondary | ICD-10-CM

## 2017-11-10 DIAGNOSIS — S40812D Abrasion of left upper arm, subsequent encounter: Secondary | ICD-10-CM | POA: Diagnosis not present

## 2017-11-10 DIAGNOSIS — E1129 Type 2 diabetes mellitus with other diabetic kidney complication: Secondary | ICD-10-CM | POA: Diagnosis present

## 2017-11-10 DIAGNOSIS — R404 Transient alteration of awareness: Secondary | ICD-10-CM | POA: Diagnosis not present

## 2017-11-10 DIAGNOSIS — I4891 Unspecified atrial fibrillation: Secondary | ICD-10-CM | POA: Diagnosis present

## 2017-11-10 LAB — I-STAT TROPONIN, ED: Troponin i, poc: 0.08 ng/mL (ref 0.00–0.08)

## 2017-11-10 LAB — BASIC METABOLIC PANEL
ANION GAP: 12 (ref 5–15)
BUN: 60 mg/dL — ABNORMAL HIGH (ref 6–20)
CHLORIDE: 102 mmol/L (ref 101–111)
CO2: 26 mmol/L (ref 22–32)
Calcium: 9.3 mg/dL (ref 8.9–10.3)
Creatinine, Ser: 1.95 mg/dL — ABNORMAL HIGH (ref 0.61–1.24)
GFR calc non Af Amer: 29 mL/min — ABNORMAL LOW (ref >60)
GFR, EST AFRICAN AMERICAN: 33 mL/min — AB (ref >60)
Glucose, Bld: 170 mg/dL — ABNORMAL HIGH (ref 65–99)
POTASSIUM: 3.7 mmol/L (ref 3.5–5.1)
SODIUM: 140 mmol/L (ref 135–145)

## 2017-11-10 LAB — CBC
HEMATOCRIT: 35.8 % — AB (ref 39.0–52.0)
HEMOGLOBIN: 11.5 g/dL — AB (ref 13.0–17.0)
MCH: 28.6 pg (ref 26.0–34.0)
MCHC: 32.1 g/dL (ref 30.0–36.0)
MCV: 89.1 fL (ref 78.0–100.0)
Platelets: 154 10*3/uL (ref 150–400)
RBC: 4.02 MIL/uL — AB (ref 4.22–5.81)
RDW: 16.9 % — ABNORMAL HIGH (ref 11.5–15.5)
WBC: 5.8 10*3/uL (ref 4.0–10.5)

## 2017-11-10 LAB — CBG MONITORING, ED: Glucose-Capillary: 154 mg/dL — ABNORMAL HIGH (ref 65–99)

## 2017-11-10 LAB — GLUCOSE, CAPILLARY: Glucose-Capillary: 126 mg/dL — ABNORMAL HIGH (ref 65–99)

## 2017-11-10 MED ORDER — POTASSIUM CHLORIDE CRYS ER 20 MEQ PO TBCR
40.0000 meq | EXTENDED_RELEASE_TABLET | Freq: Two times a day (BID) | ORAL | Status: DC
  Administered 2017-11-11 – 2017-11-13 (×7): 40 meq via ORAL
  Filled 2017-11-10 (×7): qty 2

## 2017-11-10 MED ORDER — VANCOMYCIN HCL IN DEXTROSE 1-5 GM/200ML-% IV SOLN
1000.0000 mg | INTRAVENOUS | Status: DC
  Filled 2017-11-10: qty 200

## 2017-11-10 MED ORDER — DEXTROSE 5 % IV SOLN
1.0000 g | INTRAVENOUS | Status: DC
  Administered 2017-11-10: 1 g via INTRAVENOUS
  Filled 2017-11-10 (×3): qty 1

## 2017-11-10 MED ORDER — ONDANSETRON HCL 4 MG/2ML IJ SOLN: 4.0000 mg | Freq: Four times a day (QID) | INTRAMUSCULAR | Status: DC | PRN

## 2017-11-10 MED ORDER — DEXTROSE 5 % IV SOLN: 1.0000 g | INTRAVENOUS | Status: DC

## 2017-11-10 MED ORDER — ALPRAZOLAM 0.25 MG PO TABS
0.2500 mg | ORAL_TABLET | Freq: Two times a day (BID) | ORAL | Status: DC | PRN
  Administered 2017-11-11 – 2017-11-16 (×3): 0.25 mg via ORAL
  Filled 2017-11-10 (×3): qty 1

## 2017-11-10 MED ORDER — HEPARIN SODIUM (PORCINE) 5000 UNIT/ML IJ SOLN
5000.0000 [IU] | Freq: Three times a day (TID) | INTRAMUSCULAR | Status: DC
  Administered 2017-11-11 – 2017-11-20 (×28): 5000 [IU] via SUBCUTANEOUS
  Filled 2017-11-10 (×29): qty 1

## 2017-11-10 MED ORDER — TADALAFIL (PAH) 20 MG PO TABS
20.0000 mg | ORAL_TABLET | Freq: Every day | ORAL | Status: DC
  Administered 2017-11-11 – 2017-11-20 (×10): 20 mg via ORAL
  Filled 2017-11-10 (×10): qty 1

## 2017-11-10 MED ORDER — ASPIRIN EC 325 MG PO TBEC
325.0000 mg | DELAYED_RELEASE_TABLET | Freq: Every day | ORAL | Status: DC
  Administered 2017-11-11 – 2017-11-20 (×10): 325 mg via ORAL
  Filled 2017-11-10 (×10): qty 1

## 2017-11-10 MED ORDER — SODIUM CHLORIDE 0.9% FLUSH: 3.0000 mL | INTRAVENOUS | Status: DC | PRN

## 2017-11-10 MED ORDER — SODIUM CHLORIDE 0.9 % IV BOLUS (SEPSIS)
500.0000 mL | Freq: Once | INTRAVENOUS | Status: AC
  Administered 2017-11-10: 500 mL via INTRAVENOUS

## 2017-11-10 MED ORDER — FUROSEMIDE 10 MG/ML IJ SOLN: 20.0000 mg | Freq: Once | INTRAMUSCULAR | Status: DC

## 2017-11-10 MED ORDER — SODIUM CHLORIDE 0.9% FLUSH
3.0000 mL | Freq: Two times a day (BID) | INTRAVENOUS | Status: DC
  Administered 2017-11-11 – 2017-11-20 (×20): 3 mL via INTRAVENOUS

## 2017-11-10 MED ORDER — GUAIFENESIN 100 MG/5ML PO SOLN
5.0000 mL | ORAL | Status: DC | PRN
  Administered 2017-11-15 (×3): 100 mg via ORAL
  Filled 2017-11-10 (×4): qty 5

## 2017-11-10 MED ORDER — INSULIN ASPART 100 UNIT/ML ~~LOC~~ SOLN
0.0000 [IU] | Freq: Three times a day (TID) | SUBCUTANEOUS | Status: DC
  Administered 2017-11-11: 1 [IU] via SUBCUTANEOUS
  Administered 2017-11-12 (×2): 2 [IU] via SUBCUTANEOUS
  Administered 2017-11-13: 5 [IU] via SUBCUTANEOUS
  Administered 2017-11-13: 3 [IU] via SUBCUTANEOUS
  Administered 2017-11-13 – 2017-11-14 (×2): 2 [IU] via SUBCUTANEOUS
  Administered 2017-11-14 (×2): 3 [IU] via SUBCUTANEOUS
  Administered 2017-11-15: 1 [IU] via SUBCUTANEOUS
  Administered 2017-11-15: 2 [IU] via SUBCUTANEOUS
  Administered 2017-11-15 – 2017-11-16 (×3): 1 [IU] via SUBCUTANEOUS
  Administered 2017-11-16: 2 [IU] via SUBCUTANEOUS
  Administered 2017-11-17: 1 [IU] via SUBCUTANEOUS
  Administered 2017-11-17: 3 [IU] via SUBCUTANEOUS
  Administered 2017-11-18: 2 [IU] via SUBCUTANEOUS
  Administered 2017-11-18 – 2017-11-19 (×4): 1 [IU] via SUBCUTANEOUS
  Administered 2017-11-20: 2 [IU] via SUBCUTANEOUS

## 2017-11-10 MED ORDER — BRINZOLAMIDE 1 % OP SUSP
1.0000 [drp] | Freq: Every day | OPHTHALMIC | Status: DC
  Administered 2017-11-11 – 2017-11-19 (×9): 1 [drp] via OPHTHALMIC
  Filled 2017-11-10: qty 10

## 2017-11-10 MED ORDER — SODIUM CHLORIDE 0.9 % IV SOLN: 250.0000 mL | INTRAVENOUS | Status: DC | PRN

## 2017-11-10 MED ORDER — MIRABEGRON ER 50 MG PO TB24
50.0000 mg | ORAL_TABLET | Freq: Every day | ORAL | Status: DC
  Administered 2017-11-11 – 2017-11-20 (×10): 50 mg via ORAL
  Filled 2017-11-10 (×10): qty 1

## 2017-11-10 MED ORDER — POLYETHYLENE GLYCOL 3350 17 G PO PACK
17.0000 g | PACK | Freq: Every day | ORAL | Status: DC | PRN
  Filled 2017-11-10 (×2): qty 1

## 2017-11-10 MED ORDER — TRAVOPROST (BAK FREE) 0.004 % OP SOLN
1.0000 [drp] | Freq: Every day | OPHTHALMIC | Status: DC
Start: 2017-11-10 — End: 2017-11-20
  Administered 2017-11-11 – 2017-11-19 (×9): 1 [drp] via OPHTHALMIC
  Filled 2017-11-10: qty 2.5

## 2017-11-10 MED ORDER — INSULIN ASPART 100 UNIT/ML ~~LOC~~ SOLN
0.0000 [IU] | Freq: Every day | SUBCUTANEOUS | Status: DC
  Administered 2017-11-12: 3 [IU] via SUBCUTANEOUS
  Administered 2017-11-14: 2 [IU] via SUBCUTANEOUS

## 2017-11-10 MED ORDER — ALLOPURINOL 100 MG PO TABS
100.0000 mg | ORAL_TABLET | Freq: Every day | ORAL | Status: DC
  Administered 2017-11-11 – 2017-11-20 (×10): 100 mg via ORAL
  Filled 2017-11-10 (×9): qty 1

## 2017-11-10 MED ORDER — FINASTERIDE 5 MG PO TABS
5.0000 mg | ORAL_TABLET | Freq: Every day | ORAL | Status: DC
  Administered 2017-11-11 – 2017-11-20 (×10): 5 mg via ORAL
  Filled 2017-11-10 (×10): qty 1

## 2017-11-10 MED ORDER — FUROSEMIDE 10 MG/ML IJ SOLN
40.0000 mg | Freq: Two times a day (BID) | INTRAMUSCULAR | Status: DC
  Administered 2017-11-10 – 2017-11-13 (×7): 40 mg via INTRAVENOUS
  Filled 2017-11-10 (×7): qty 4

## 2017-11-10 MED ORDER — ACETAMINOPHEN 325 MG PO TABS: 650.0000 mg | ORAL_TABLET | ORAL | Status: DC | PRN

## 2017-11-10 MED ORDER — VANCOMYCIN HCL 10 G IV SOLR
1750.0000 mg | Freq: Once | INTRAVENOUS | Status: AC
  Administered 2017-11-10: 1750 mg via INTRAVENOUS
  Filled 2017-11-10: qty 1750

## 2017-11-10 NOTE — Progress Notes (Signed)
Pharmacy Antibiotic Note  [name] [name] is a 84 y.o. male admitted on 11/10/2017 with cellulitis.  Pharmacy has been consulted for vancomcyin dosing. Pt is afebrile. Weight in ED was 205 lbs per pt report. Scr 1.95, CrCl ~30 ml/min, WBC WNL.  Plan: Vancomycin 1750mg  IV x1 Vancomycin 1000 mg IV every 24 hours.  Goal trough 10-15 mcg/mL.  F/u renal fxn, trough @ SS, clinical resolution  Temp (24hrs), Avg:97.4 F (36.3 C), Min:97.4 F (36.3 C), Max:97.4 F (36.3 C)  No results for input(s): WBC, CREATININE, LATICACIDVEN, VANCOTROUGH, VANCOPEAK, VANCORANDOM, GENTTROUGH, GENTPEAK, GENTRANDOM, TOBRATROUGH, TOBRAPEAK, TOBRARND, AMIKACINPEAK, AMIKACINTROU, AMIKACIN in the last 168 hours.  CrCl cannot be calculated (Patient's most recent lab result is older than the maximum 21 days allowed.).    No Known Allergies  Antimicrobials this admission: Vanc 1/11 >>   Microbiology results: Pending  Thank you for allowing pharmacy to be a part of this patient's care.  [name] [name] 11/10/2017 5:29 PM

## 2017-11-10 NOTE — H&P (Signed)
History and Physical    [name] [name]:[id number] DOB: 24-Sep-1925 DOA: 11/10/2017  PCP: Gaspar Garbe, MD   Patient coming from: Home  Chief Complaint: Cough, swelling, increased confusion   HPI: [name] [name] is a 84 y.o. male with medical history significant for paroxysmal atrial fibrillation no longer on anticoagulation, chronic kidney disease stage III, type 2 diabetes mellitus, dementia, and hypertension, now presenting to the emergency department for evaluation of increased confusion, swelling, and cough.  Patient is accompanied by his caretaker who provides much of the history.  He had been admitted to the hospital last month, was discharged to acute rehab, and then returned home a couple days ago.  He was noted to have a new cough when he returned home and his bilateral lower extremity swelling did seem to increase.  He is also been slightly more confused over the past couple days.  He has not been expressing any specific complaints.  ED Course: Upon arrival to the ED, patient is found to be afebrile, saturating mid 90s on room air, and with vitals otherwise normal. EKG features atrial fibrillation and chest x-ray is notable for mild interstitial pulmonary edema and consolidation at the left base.  Chemistry panel is notable for a creatinine of 1.95, slightly up from his apparent baseline of roughly 1.8.  CBC demonstrates a stable chronic normocytic anemia with hemoglobin of 11.5.  Patient was given 500 cc normal saline in the ED and treated with empiric vancomycin and cefepime.  He will be admitted to the telemetry unit for ongoing evaluation and cough and increased swelling secondary to HCAP and acute on chronic diastolic CHF.  Review of Systems:  Unable to complete ROS secondary to patient's clinical condition with dementia.  Past Medical History:  Diagnosis Date  . BPH with elevated PSA   . Chest pain   . Chronic kidney disease, stage III (moderate) (HCC)   . DM  (diabetes mellitus), type 2 with renal complications (HCC)   . ED (erectile dysfunction)   . Glaucoma   . Hyperlipidemia   . Hypertension   . Microalbuminuria   . Microalbuminuria 2013  . Obesity   . Osteoarthritis   . Prostatic hypertrophy, benign, with obstruction   . Renal calculus   . Sick sinus syndrome [facility])     Past Surgical History:  Procedure Laterality Date  . INTRAMEDULLARY (IM) NAIL INTERTROCHANTERIC Left 06/21/2017   Procedure: INTRAMEDULLARY (IM) NAIL INTERTROCHANTRIC Left Leg;  Surgeon: Sheral Apley, MD;  Location: [facility] OR;  Service: Orthopedics;  Laterality: Left;  . INTRAVASCULAR PRESSURE WIRE/FFR STUDY N/A 04/18/2017   Procedure: Intravascular Pressure Wire/FFR Study;  Surgeon: Yates Decamp, MD;  Location: [facility] INVASIVE CV LAB;  Service: Cardiovascular;  Laterality: N/A;  Prox LAD  . KNEE SURGERY    . LOOP RECORDER INSERTION N/A 06/27/2017   Procedure: LOOP RECORDER INSERTION;  Surgeon: Hillis Range, MD;  Location: [facility] INVASIVE CV LAB;  Service: Cardiovascular;  Laterality: N/A;  . REPLACEMENT TOTAL KNEE  02/26/01     reports that he has quit smoking. His smoking use included cigarettes. he has never used smokeless tobacco. He reports that he does not drink alcohol or use drugs.  No Known Allergies  Family History  Problem Relation Age of Onset  . Cancer Paternal Grandfather        STOMACH  . Cancer Sister        MALE CANCER  . Cancer - Colon Sister   . Stroke Father   .  Healthy Child   . Healthy Child   . Other Child        BRAIN DAMAGE AT BIRTH     Prior to Admission medications   Medication Sig Start Date End Date Taking? Authorizing Provider  allopurinol (ZYLOPRIM) 100 MG tablet Take 100 mg by mouth daily.   Yes [provider]  aspirin EC 325 MG tablet Take 325 mg by mouth daily.   Yes [provider]  brinzolamide (AZOPT) 1 % ophthalmic suspension Place 1 drop into both eyes at bedtime.    Yes [provider]    cefpodoxime (VANTIN) 200 MG tablet Take 1 tablet (200 mg total) by mouth daily. 10/16/17  Yes Narda BondsNettey, Ralph A, MD  finasteride (PROSCAR) 5 MG tablet Take 5 mg by mouth daily.   Yes [provider]  furosemide (LASIX) 40 MG tablet Take 1 tablet (40 mg total) by mouth 2 (two) times daily. 10/16/17  Yes Narda BondsNettey, Ralph A, MD  metolazone (ZAROXOLYN) 2.5 MG tablet Take 2.5 mg by mouth every other day. Filled 09-20-17   Yes [provider]  MYRBETRIQ 50 MG TB24 tablet Take 50 mg by mouth daily. 11/27/15  Yes [provider]  polyethylene glycol (MIRALAX / GLYCOLAX) packet Take 17 g by mouth daily as needed for mild constipation. 10/16/17  Yes Narda BondsNettey, Ralph A, MD  potassium chloride SA (K-DUR,KLOR-CON) 20 MEQ tablet Take 40 mEq by mouth 2 (two) times daily.    Yes [provider]  sitaGLIPtin (JANUVIA) 25 MG tablet Take 1 tablet (25 mg total) by mouth daily. 10/16/17  Yes Narda BondsNettey, Ralph A, MD  tadalafil, PAH, (ADCIRCA) 20 MG tablet Take 1 tablet (20 mg total) by mouth daily. 10/17/17  Yes Narda BondsNettey, Ralph A, MD  travoprost, benzalkonium, (TRAVATAN) 0.004 % ophthalmic solution Place 1 drop into both eyes at bedtime.   Yes [provider]    Physical Exam: Vitals:   11/10/17 1730 11/10/17 1830 11/10/17 1900 11/10/17 2000  BP: 107/61 104/64 129/69 118/79  Pulse: 61  72 (!) 59  Resp: 16  16   Temp:      TempSrc:      SpO2: 99% 97% 96% 96%      Constitutional: NAD, calm, chronically-ill and frail appearing Eyes: PERTLA, lids and conjunctivae normal ENMT: Mucous membranes are moist. Posterior pharynx clear of any exudate or lesions.   Neck: normal, supple, no masses, no thyromegaly Respiratory: Bilateral rales, left basilar rhonchi. No accessory muscle use.  Cardiovascular: Rate ~60 and irregular. Bilateral LE edema to the hips. Abdomen: No distension, no tenderness, no masses palpated. Bowel sounds normal.  Musculoskeletal: no clubbing / cyanosis. No joint  deformity upper and lower extremities.   Skin: no significant rashes, lesions, ulcers. Warm, dry, well-perfused. Neurologic: CN 2-12 grossly intact. Sensation intact. Strength 5/5 in all 4 limbs.  Psychiatric: Alert and oriented to person and place only. Calm, cooperative.     Labs on Admission: I have personally reviewed following labs and imaging studies  CBC: Recent Labs  Lab 11/10/17 1715  WBC 5.8  HGB 11.5*  HCT 35.8*  MCV 89.1  PLT 154   Basic Metabolic Panel: Recent Labs  Lab 11/10/17 1715  NA 140  K 3.7  CL 102  CO2 26  GLUCOSE 170*  BUN 60*  CREATININE 1.95*  CALCIUM 9.3   GFR: CrCl cannot be calculated (Unknown ideal weight.). Liver Function Tests: No results for input(s): AST, ALT, ALKPHOS, BILITOT, PROT, ALBUMIN in the last 168  hours. No results for input(s): LIPASE, AMYLASE in the last 168 hours. No results for input(s): AMMONIA in the last 168 hours. Coagulation Profile: No results for input(s): INR, PROTIME in the last 168 hours. Cardiac Enzymes: No results for input(s): CKTOTAL, CKMB, CKMBINDEX, TROPONINI in the last 168 hours. BNP (last 3 results) No results for input(s): PROBNP in the last 8760 hours. HbA1C: No results for input(s): HGBA1C in the last 72 hours. CBG: Recent Labs  Lab 11/10/17 1702  GLUCAP 154*   Lipid Profile: No results for input(s): CHOL, HDL, LDLCALC, TRIG, CHOLHDL, LDLDIRECT in the last 72 hours. Thyroid Function Tests: No results for input(s): TSH, T4TOTAL, FREET4, T3FREE, THYROIDAB in the last 72 hours. Anemia Panel: No results for input(s): VITAMINB12, FOLATE, FERRITIN, TIBC, IRON, RETICCTPCT in the last 72 hours. Urine analysis:    Component Value Date/Time   COLORURINE YELLOW 10/13/2017 1052   APPEARANCEUR HAZY (A) 10/13/2017 1052   LABSPEC 1.013 10/13/2017 1052   PHURINE 5.0 10/13/2017 1052   GLUCOSEU NEGATIVE 10/13/2017 1052   HGBUR LARGE (A) 10/13/2017 1052   BILIRUBINUR NEGATIVE 10/13/2017 1052    KETONESUR NEGATIVE 10/13/2017 1052   PROTEINUR 30 (A) 10/13/2017 1052   NITRITE NEGATIVE 10/13/2017 1052   LEUKOCYTESUR LARGE (A) 10/13/2017 1052   Sepsis Labs: @LABRCNTIP (procalcitonin:4,lacticidven:4) )No results found for this or any previous visit (from the past 240 hour(s)).   Radiological Exams on Admission: Dg Chest 2 View  Result Date: 11/10/2017 CLINICAL DATA:  Increased confusion EXAM: CHEST  2 VIEW COMPARISON:  October 13, 2017 FINDINGS: The heart size and mediastinal contours are stable. The heart size is enlarged. The lateral diffuse increased pulmonary interstitium is identified. Consolidation of left lung base with associated left pleural effusion is noted. The visualized skeletal structures are unremarkable. IMPRESSION: Mild interstitial pulmonary edema. Consolidation left lung base, pneumonia is not excluded. Left pleural effusion. Electronically Signed   By: Sherian Rein M.D.   On: 11/10/2017 17:52    EKG: Independently reviewed. Atrial fibrillation, rate 63.   Assessment/Plan  1. HCAP  - Presents with increased confusion and cough - Found to have left basilar consolidation on CXR  - Treated in ED with vancomycin and cefepime  - Check sputum culture and strep pneumo antigen - Continue current abx, continue supportive care with prn supplemental O2, antitussives    2. Acute on chronic diastolic CHF; pulmonary arterial hypertension   - Presents with marked peripheral edema, noted to have interstitial pulm edema on CXR  - Echo from August 2018 with preserved EF - Diurese with Lasix 40 mg IV q12h, SLIV, fluid-restrict diet, follow daily wts and I/O's, update echocardiogram, follow daily chem panels during diuresis, no ACE/ARB d/t renal insufficiency, no beta-blocker due to bradycardia  - Continue tadalafil   3. Paroxysmal atrial fibrillation  - In rate-controlled a fib on admission - CHADS-VASc is 69 (age x2, CHF, DM, HTN)  - Recently taken off of Eliquis due to  recurrent falls  - Continue ASA 325 mg   4. CKD stage II  - SCr is 1.95 on admission, up slightly from apparent baseline  - Renally-dose medications  - Follow daily chem panels during diuresis    5. Type II DM  - A1c was 8.4% last month  - Managed at home with Januvia, held on admission  - Check CBG's and start SSI with Novolog    DVT prophylaxis: sq heparin  Code Status: DNR Family Communication: Caretaker updated at bedside Disposition Plan: Admit to telemetry Consults  called: None Admission status: Inpatient    Briscoe Deutscher, MD [facility] Pager [phone]  If 7PM-7AM, please contact night-coverage [email or web address] Password [email or web address]  11/10/2017, 9:02 PM

## 2017-11-10 NOTE — ED Notes (Signed)
Patient is stable and ready to be transport to the floor at this time.  Report was called to 5W RN.  Belongings taken with the patient to the floor.   

## 2017-11-10 NOTE — ED Provider Notes (Signed)
[facility] EMERGENCY DEPARTMENT Provider Note   CSN: [id number] Arrival date & time: 11/10/17  1650     History   Chief Complaint Chief Complaint  Patient presents with  . Altered Mental Status    HPI [name] [name] is a 84 y.o. male.  Level 5 caveat for acuity of condition and confusion.  Most of history obtained from family members.  Patient was recently admitted to the hospital for a urinary tract infection.  He was then discharged to [facility] which is a rehab center.  He went home yesterday.  He is supposed to be on home oxygen but he pulls it off frequently.  The family states that both legs and scrotum are reddened.  Review of systems positive for tachypnea and confusion.      Past Medical History:  Diagnosis Date  . BPH with elevated PSA   . Chest pain   . Chronic kidney disease, stage III (moderate) (HCC)   . DM (diabetes mellitus), type 2 with renal complications (HCC)   . ED (erectile dysfunction)   . Glaucoma   . Hyperlipidemia   . Hypertension   . Microalbuminuria   . Microalbuminuria 2013  . Obesity   . Osteoarthritis   . Prostatic hypertrophy, benign, with obstruction   . Renal calculus   . Sick sinus syndrome [facility])     Patient Active Problem List   Diagnosis Date Noted  . HCAP (healthcare-associated pneumonia) 11/10/2017  . Cellulitis and abscess of Bil leg/Rt > Lt 10/13/2017  . UTI (urinary tract infection) 10/13/2017  . Acute metabolic encephalopathy/UTI/AKI/Cellulitis 10/13/2017  . Acute-on-chronic kidney injury (HCC) 10/13/2017  . Encephalopathy 10/13/2017  . Hypokalemia 07/24/2017  . Closed intertrochanteric fracture of left femur (HCC)   . Bradycardia   . Closed left hip fracture, initial encounter (HCC) 06/21/2017  . Hypotension 06/21/2017  . Syncope 06/21/2017  . Iron deficiency anemia 06/21/2017  . Elevated troponin 06/21/2017  . Closed fracture of left hip (HCC)   . Chronic diastolic CHF (congestive heart  failure) (HCC) 12/01/2016  . Atrial fibrillation (HCC) 02/24/2016  . Bilateral leg weakness 09/17/2015  . Chest pain   . DM (diabetes mellitus), type 2 with renal complications (HCC)   . Hypertension   . Hyperlipidemia   . Prostatic hypertrophy, benign, with obstruction   . Sick sinus syndrome (HCC)   . Obesity   . Microalbuminuria   . Glaucoma   . Renal calculus   . Osteoarthritis   . Chronic kidney disease, stage III (moderate) (HCC)   . BPH with elevated PSA   . ED (erectile dysfunction)   . RECTAL BLEEDING 04/22/2008  . DYSPHAGIA 04/22/2008  . HYPERLIPIDEMIA 04/18/2008  . HTN (hypertension) 04/18/2008  . ESOPHAGEAL STRICTURE 04/18/2008  . GERD 04/18/2008  . HIATAL HERNIA 04/18/2008  . DIVERTICULOSIS, COLON 04/18/2008  . COLONIC POLYPS, HYPERPLASTIC, HX OF 04/18/2008    Past Surgical History:  Procedure Laterality Date  . INTRAMEDULLARY (IM) NAIL INTERTROCHANTERIC Left 06/21/2017   Procedure: INTRAMEDULLARY (IM) NAIL INTERTROCHANTRIC Left Leg;  Surgeon: Sheral Apley, MD;  Location: [facility] OR;  Service: Orthopedics;  Laterality: Left;  . INTRAVASCULAR PRESSURE WIRE/FFR STUDY N/A 04/18/2017   Procedure: Intravascular Pressure Wire/FFR Study;  Surgeon: Yates Decamp, MD;  Location: [facility] INVASIVE CV LAB;  Service: Cardiovascular;  Laterality: N/A;  Prox LAD  . KNEE SURGERY    . LOOP RECORDER INSERTION N/A 06/27/2017   Procedure: LOOP RECORDER INSERTION;  Surgeon: Hillis Range, MD;  Location: [facility] INVASIVE  CV LAB;  Service: Cardiovascular;  Laterality: N/A;  . REPLACEMENT TOTAL KNEE  02/26/01       Home Medications    Prior to Admission medications   Medication Sig Start Date End Date Taking? Authorizing Provider  allopurinol (ZYLOPRIM) 100 MG tablet Take 100 mg by mouth daily.   Yes [provider]  aspirin EC 325 MG tablet Take 325 mg by mouth daily.   Yes [provider]  brinzolamide (AZOPT) 1 % ophthalmic suspension Place 1 drop into both eyes at bedtime.     Yes [provider]  cefpodoxime (VANTIN) 200 MG tablet Take 1 tablet (200 mg total) by mouth daily. 10/16/17  Yes Narda Bonds, MD  finasteride (PROSCAR) 5 MG tablet Take 5 mg by mouth daily.   Yes [provider]  furosemide (LASIX) 40 MG tablet Take 1 tablet (40 mg total) by mouth 2 (two) times daily. 10/16/17  Yes Narda Bonds, MD  metolazone (ZAROXOLYN) 2.5 MG tablet Take 2.5 mg by mouth every other day. Filled 09-20-17   Yes [provider]  MYRBETRIQ 50 MG TB24 tablet Take 50 mg by mouth daily. 11/27/15  Yes [provider]  polyethylene glycol (MIRALAX / GLYCOLAX) packet Take 17 g by mouth daily as needed for mild constipation. 10/16/17  Yes Narda Bonds, MD  potassium chloride SA (K-DUR,KLOR-CON) 20 MEQ tablet Take 40 mEq by mouth 2 (two) times daily.    Yes [provider]  sitaGLIPtin (JANUVIA) 25 MG tablet Take 1 tablet (25 mg total) by mouth daily. 10/16/17  Yes Narda Bonds, MD  tadalafil, PAH, (ADCIRCA) 20 MG tablet Take 1 tablet (20 mg total) by mouth daily. 10/17/17  Yes Narda Bonds, MD  travoprost, benzalkonium, (TRAVATAN) 0.004 % ophthalmic solution Place 1 drop into both eyes at bedtime.   Yes [provider]    Family History Family History  Problem Relation Age of Onset  . Cancer Paternal Grandfather        STOMACH  . Cancer Sister        MALE CANCER  . Cancer - Colon Sister   . Stroke Father   . Healthy Child   . Healthy Child   . Other Child        BRAIN DAMAGE AT BIRTH    Social History Social History   Tobacco Use  . Smoking status: Former Smoker    Types: Cigarettes  . Smokeless tobacco: Never Used  Substance Use Topics  . Alcohol use: No  . Drug use: No     Allergies   Patient has no known allergies.   Review of Systems Review of Systems  Unable to perform ROS: Acuity of condition     Physical Exam Updated Vital Signs BP 118/79   Pulse (!) 59   Temp (!) 97.4 F  (36.3 C) (Oral)   Resp 16   SpO2 96%   Physical Exam  Constitutional: He is oriented to person, place, and time.  Appears ill, tachypnea  HENT:  Head: Normocephalic and atraumatic.  Eyes: Conjunctivae are normal.  Neck: Neck supple.  Cardiovascular: Normal rate and regular rhythm.  Pulmonary/Chest:  Slight dyspnea and tachypnea.  Abdominal: Soft. Bowel sounds are normal.  Musculoskeletal: Normal range of motion.  Neurological: He is alert and oriented to person, place, and time.  Skin:  Bilateral legs are erythematous.  His scrotal sac is erythematous and edematous.  Psychiatric: He has a normal mood and affect. His behavior  is normal.  Nursing note and vitals reviewed.    ED Treatments / Results  Labs (all labs ordered are listed, but only abnormal results are displayed) Labs Reviewed  BASIC METABOLIC PANEL - Abnormal; Notable for the following components:      Result Value   Glucose, Bld 170 (*)    BUN 60 (*)    Creatinine, Ser 1.95 (*)    GFR calc non Af Amer 29 (*)    GFR calc Af Amer 33 (*)    All other components within normal limits  CBC - Abnormal; Notable for the following components:   RBC 4.02 (*)    Hemoglobin 11.5 (*)    HCT 35.8 (*)    RDW 16.9 (*)    All other components within normal limits  CBG MONITORING, ED - Abnormal; Notable for the following components:   Glucose-Capillary 154 (*)    All other components within normal limits  I-STAT TROPONIN, ED    EKG  EKG Interpretation  Date/Time:  Friday November 10 2017 17:00:07 EST Ventricular Rate:  63 PR Interval:    QRS Duration: 105 QT Interval:  487 QTC Calculation: 499 R Axis:   -80 Text Interpretation:  Atrial fibrillation Left axis deviation Low voltage, extremity and precordial leads Borderline repolarization abnormality Borderline prolonged QT interval Baseline wander in lead(s) I III aVR aVL aVF V1 V3 V4 V5 V6 Confirmed by Donnetta Hutchingook, Timmy Bubeck ([id number]) on 11/10/2017 6:46:28 PM        Radiology Dg Chest 2 View  Result Date: 11/10/2017 CLINICAL DATA:  Increased confusion EXAM: CHEST  2 VIEW COMPARISON:  October 13, 2017 FINDINGS: The heart size and mediastinal contours are stable. The heart size is enlarged. The lateral diffuse increased pulmonary interstitium is identified. Consolidation of left lung base with associated left pleural effusion is noted. The visualized skeletal structures are unremarkable. IMPRESSION: Mild interstitial pulmonary edema. Consolidation left lung base, pneumonia is not excluded. Left pleural effusion. Electronically Signed   By: Sherian ReinWei-Chen  Lin M.D.   On: 11/10/2017 17:52    Procedures Procedures (including critical care time)  Medications Ordered in ED Medications  vancomycin (VANCOCIN) IVPB 1000 mg/200 mL premix (not administered)  ceFEPIme (MAXIPIME) 1 g in dextrose 5 % 50 mL IVPB (not administered)  sodium chloride 0.9 % bolus 500 mL (0 mLs Intravenous Stopped 11/10/17 2034)  vancomycin (VANCOCIN) 1,750 mg in sodium chloride 0.9 % 500 mL IVPB (1,750 mg Intravenous New Bag/Given 11/10/17 1846)     Initial Impression / Assessment and Plan / ED Course  I have reviewed the triage vital signs and the nursing notes.  Pertinent labs & imaging results that were available during my care of the patient were reviewed by me and considered in my medical decision making (see chart for details).     Complex patient presents with altered mental status, cellulitic legs and scrotum, left pleural effusion versus pneumonia with the possibility of interstitial pulmonary edema.  Will initiate vancomycin and Maxipime, IV Lasix, admit to general medicine.  I discussed with the family that he is not in good condition.  They understand that he is a DNR.    CRITICAL CARE Performed by: Donnetta HutchingOOK,Laketta Soderberg Total critical care time: 35 minutes Critical care time was exclusive of separately billable procedures and treating other patients. Critical care was necessary to  treat or prevent imminent or life-threatening deterioration. Critical care was time spent personally by me on the following activities: development of treatment plan with patient and/or surrogate as well  as nursing, discussions with consultants, evaluation of patient's response to treatment, examination of patient, obtaining history from patient or surrogate, ordering and performing treatments and interventions, ordering and review of laboratory studies, ordering and review of radiographic studies, pulse oximetry and re-evaluation of patient's condition.  Final Clinical Impressions(s) / ED Diagnoses   Final diagnoses:  Cellulitis of right leg  Cellulitis of left leg  Cellulitis, scrotum  HCAP (healthcare-associated pneumonia)    ED Discharge Orders    None       Donnetta Hutching, MD 11/10/17 2057

## 2017-11-10 NOTE — ED Triage Notes (Signed)
[facility]. Patient has dementia and lives at home alone with 24 hour home health aid. Patient is alert and oriented to self and location. Aid at bedside states the patient has been removing his oxygen tubing and picking at scabs on his arms. Pitting edema in bilateral lower extremities. Patient repeatedly appears to be trying to clear his throat, aid states this started yesterday, patient states he does not know why he is doing it.

## 2017-11-11 ENCOUNTER — Inpatient Hospital Stay (HOSPITAL_COMMUNITY): Payer: Medicare Other

## 2017-11-11 DIAGNOSIS — L03116 Cellulitis of left lower limb: Secondary | ICD-10-CM

## 2017-11-11 DIAGNOSIS — I350 Nonrheumatic aortic (valve) stenosis: Secondary | ICD-10-CM

## 2017-11-11 DIAGNOSIS — L03115 Cellulitis of right lower limb: Secondary | ICD-10-CM

## 2017-11-11 LAB — BASIC METABOLIC PANEL
ANION GAP: 11 (ref 5–15)
BUN: 59 mg/dL — ABNORMAL HIGH (ref 6–20)
CHLORIDE: 102 mmol/L (ref 101–111)
CO2: 26 mmol/L (ref 22–32)
CREATININE: 1.85 mg/dL — AB (ref 0.61–1.24)
Calcium: 8.9 mg/dL (ref 8.9–10.3)
GFR calc non Af Amer: 30 mL/min — ABNORMAL LOW (ref >60)
GFR, EST AFRICAN AMERICAN: 35 mL/min — AB (ref >60)
Glucose, Bld: 128 mg/dL — ABNORMAL HIGH (ref 65–99)
Potassium: 3.5 mmol/L (ref 3.5–5.1)
Sodium: 139 mmol/L (ref 135–145)

## 2017-11-11 LAB — ECHOCARDIOGRAM COMPLETE
HEIGHTINCHES: 68 in
WEIGHTICAEL: 3350.99 [oz_av]

## 2017-11-11 LAB — GLUCOSE, CAPILLARY
GLUCOSE-CAPILLARY: 106 mg/dL — AB (ref 65–99)
Glucose-Capillary: 140 mg/dL — ABNORMAL HIGH (ref 65–99)
Glucose-Capillary: 107 mg/dL — ABNORMAL HIGH (ref 65–99)
Glucose-Capillary: 164 mg/dL — ABNORMAL HIGH (ref 65–99)

## 2017-11-11 LAB — STREP PNEUMONIAE URINARY ANTIGEN: Strep Pneumo Urinary Antigen: NEGATIVE

## 2017-11-11 LAB — PROCALCITONIN

## 2017-11-11 NOTE — Evaluation (Signed)
Physical Therapy Evaluation Patient Details Name: [name] E Okerlund MRN: [id number] DOB: January 13, 1925 Today's Date: 11/11/2017   History of Present Illness  Pt is a 84 y/o male admitted secondary to cough. Pt found to hav HCAP. PMH including but not limited to CKD, DM and HTN.    Clinical Impression  Pt presented supine in bed with HOB elevated, awake and willing to participate in therapy session. Prior to admission, pt reported that he ambulates with use of RW and requires assistance with ADLs from his "home nurse". Pt currently requires min guard for safety with bed mobility, min A with use of RW for transfers and close min guard to min A to ambulate a short distance within his room with use of RW. Pt with modest instability with mobility. Pt would greatly benefit from further intensive therapy services in the SNF setting for ST rehab prior to returning home. Pt would continue to benefit from skilled physical therapy services at this time while admitted and after d/c to address the below listed limitations in order to improve overall safety and independence with functional mobility.     Follow Up Recommendations SNF;Supervision/Assistance - 24 hour;Other (comment)(for ST rehab)    Equipment Recommendations  None recommended by PT    Recommendations for Other Services       Precautions / Restrictions Precautions Precautions: Fall Restrictions Weight Bearing Restrictions: No      Mobility  Bed Mobility Overal bed mobility: Needs Assistance Bed Mobility: Supine to Sit;Sit to Supine     Supine to sit: Min guard Sit to supine: Min guard   General bed mobility comments: increased time and effort, min guard for safety  Transfers Overall transfer level: Needs assistance Equipment used: Rolling walker (2 wheeled) Transfers: Sit to/from Stand Sit to Stand: Min assist         General transfer comment: assist for stability with transition into standing from  EOB  Ambulation/Gait Ambulation/Gait assistance: Min guard;Min assist Ambulation Distance (Feet): 15 Feet Assistive device: Rolling walker (2 wheeled) Gait Pattern/deviations: Step-through pattern;Decreased step length - right;Decreased step length - left;Decreased stride length;Trunk flexed Gait velocity: decreased Gait velocity interpretation: Below normal speed for age/gender General Gait Details: close min guard to occasional min A for safety and stability with use of RW  Stairs            Wheelchair Mobility    Modified Rankin (Stroke Patients Only)       Balance Overall balance assessment: Needs assistance Sitting-balance support: Feet supported Sitting balance-Leahy Scale: Good     Standing balance support: During functional activity;Bilateral upper extremity supported Standing balance-Leahy Scale: Poor                               Pertinent Vitals/Pain Pain Assessment: No/denies pain    Home Living Family/patient expects to be discharged to:: Private residence Living Arrangements: Alone Available Help at Discharge: Other (Comment)(HHRN that comes 5 days a week for several hours) Type of Home: House Home Access: Stairs to enter   Enter[employer] CorporationEntrance Stairs-Number of Steps: 2 Home Layout: One level Home Equipment: Walker - 2 wheels Additional Comments: unsure how accurate information provided by patient; pt lethargic and occasionally confused throughout; no family or caregivers present throughout    Prior Function Level of Independence: Needs assistance   Gait / Transfers Assistance Needed: pt reports he ambulates with use of RW  ADL's / Homemaking Assistance Needed: [facility]  [facility] with [facility]/dressing        Hand Dominance        Extremity/Trunk Assessment   Upper Extremity Assessment Upper Extremity Assessment: Generalized weakness    Lower Extremity Assessment Lower Extremity Assessment: Generalized weakness        Communication   Communication: HOH  Cognition Arousal/Alertness: Awake/alert;Lethargic Behavior During Therapy: WFL for tasks assessed/performed Overall Cognitive Status: No family/caregiver present to determine baseline cognitive functioning Area of Impairment: Memory;Safety/judgement;Problem solving                     Memory: Decreased short-term memory   Safety/Judgement: Decreased awareness of safety;Decreased awareness of deficits   Problem Solving: Difficulty sequencing;Requires verbal cues;Requires tactile cues        General Comments      Exercises     Assessment/Plan    PT Assessment Patient needs continued PT services  PT Problem List Decreased strength;Decreased activity tolerance;Decreased balance;Decreased mobility;Decreased coordination;Decreased knowledge of use of DME;Decreased safety awareness;Decreased knowledge of precautions       PT Treatment Interventions DME instruction;Gait training;Stair training;Functional mobility training;Therapeutic activities;Therapeutic exercise;Neuromuscular re-education;Balance training;Cognitive remediation;Patient/family education    PT Goals (Current goals can be found in the Care Plan section)  Acute Rehab PT Goals Patient Stated Goal: return home PT Goal Formulation: With patient Time For Goal Achievement: 11/25/17 Potential to Achieve Goals: Good    Frequency Min 3X/week   Barriers to discharge Decreased caregiver support      Co-evaluation               AM-PAC PT "6 Clicks" Daily Activity  Outcome Measure Difficulty turning over in bed (including adjusting bedclothes, sheets and blankets)?: A Little Difficulty moving from lying on back to sitting on the side of the bed? : A Little Difficulty sitting down on and standing up from a chair with arms (e.g., wheelchair, bedside commode, etc,.)?: Unable Help needed moving to and from a bed to chair (including a wheelchair)?: A Little Help needed  walking in hospital room?: A Little Help needed climbing 3-5 steps with a railing? : A Lot 6 Click Score: 15    End of Session Equipment Utilized During Treatment: Gait belt Activity Tolerance: Patient limited by fatigue Patient left: in bed;with call bell/phone within reach;with bed alarm set Nurse Communication: Mobility status PT Visit Diagnosis: Other abnormalities of gait and mobility (R26.89)    Time: 1610-9604 PT Time Calculation (min) (ACUTE ONLY): 17 min   Charges:   PT Evaluation $PT Eval Moderate Complexity: 1 Mod     PT G Codes:        N[place]ika [place], PT, DPT [phone]   Alessandra Bevels Joanne Salah 11/11/2017, 4:14 PM

## 2017-11-11 NOTE — Progress Notes (Signed)
PROGRESS NOTE    [name] [name]  FHL:[id number] DOB: [birth year] DOA: 11/10/2017 PCP: Haywood Pao, MD  Brief Narrative: [name] [name] is a 84 y.o. male with medical history significant for paroxysmal atrial fibrillation no longer on anticoagulation, Dementia, chronic diastolic CHF, chronic kidney disease stage III, type 2 diabetes mellitus, dementia, and hypertension, now presenting to the emergency department for evaluation of increased confusion, swelling, and cough. He lives at home with caregivers. In emergency room noted to have pulmonary edema, pleural effusion and? Pneumonia  Assessment & Plan:   1. Acute on chronic diastolic CHF -Clinically appears volume overloaded -Chest x-ray with interstitial edema and pleural effusions exam with increased peripheral edema abdominal wall edema etc. -Last echo from 05/2017 with preserved ejection fraction -Continue IV Lasix -Monitor urine output and B met  2. Doubt healthcare associated pneumonia -Suspect this presentation is due to CHF, clinically volume overloaded and has no fevers or leukocytosis -Check procalcitonin and if low, will stop antibiotics and monitor clinically  3. Paroxysmal atrial fibrillation -Chad/score is 5 -Recently taken off Eliquis due to recurrent falls, continue aspirin -Heart rate is controlled   4. CKD stage III -Creatinine is close to baseline, monitor with diuresis  5. Type II DM  - A1c was 8.4% last month  - Managed at home with Januvia, held on admission  - Check CBG's and start SSI with Novolog   DVT prophylaxis: sq heparin  Code Status: DNR Family Communication no family at bedside disposition Plan:  to be determined, home with caregivers versus rehabilitation again    Procedures:   Antimicrobials:  Antibiotics Given (last 72 hours)    Date/Time Action Medication Dose Rate   11/10/17 1846 New Bag/Given   vancomycin (VANCOCIN) 1,750 mg in sodium chloride 0.9 % 500 mL IVPB 1,750  mg 250 mL/hr   11/10/17 2132 New Bag/Given   ceFEPIme (MAXIPIME) 1 g in dextrose 5 % 50 mL IVPB 1 g 100 mL/hr      Subjective: -still coughing and slightly short of breath  Objective: Vitals:   11/10/17 2115 11/10/17 2230 11/11/17 0513 11/11/17 0756  BP: 111/71 102/65 105/70   Pulse:  66 60   Resp:   20 20  Temp:  98.2 F (36.8 C) 98 F (36.7 C)   TempSrc:  Oral Oral   SpO2:  95% 97% 98%  Weight:  95 kg (209 lb 7 oz) 95 kg (209 lb 7 oz)   Height:  _0  (1.727 m)      Intake/Output Summary (Last 24 hours) at 11/11/2017 1125 Last data filed at 11/11/2017 0944 Gross per 24 hour  Intake 1410 ml  Output 1200 ml  Net 210 ml   Filed Weights   11/10/17 2230 11/11/17 0513  Weight: 95 kg (209 lb 7 oz) 95 kg (209 lb 7 oz)    Examination:  General exam:  elderly frail, chronically ill male, pleasant alert awake oriented 2  Respiratory system: findings basilar crackles  Cardiovascular system: S1 & S2 heard, RRR  Gastrointestinal system: Abdomen is nondistended, soft and nontender.Normal bowel sounds heard. Central nervous system: Alert and oriented. No focal neurological deficits. Extremities: 2+ edema Skin: No rashes, lesions or ulcers Psychiatry: Pleasant and appropriate    Data Reviewed:   CBC: Recent Labs  Lab 11/10/17 1715  WBC 5.8  HGB 11.5*  HCT 35.8*  MCV 89.1  PLT 373   Basic Metabolic Panel: Recent Labs  Lab 11/10/17 1715 11/11/17 0548  NA 140 139  K 3.7 3.5  CL 102 102  CO2 26 26  GLUCOSE 170* 128*  BUN 60* 59*  CREATININE 1.95* 1.85*  CALCIUM 9.3 8.9   GFR: Estimated Creatinine Clearance: 29.7 mL/min (A) (by C-G formula based on SCr of 1.85 mg/dL (H)). Liver Function Tests: No results for input(s): AST, ALT, ALKPHOS, BILITOT, PROT, ALBUMIN in the last 168 hours. No results for input(s): LIPASE, AMYLASE in the last 168 hours. No results for input(s): AMMONIA in the last 168 hours. Coagulation Profile: No results for input(s): INR,  PROTIME in the last 168 hours. Cardiac Enzymes: No results for input(s): CKTOTAL, CKMB, CKMBINDEX, TROPONINI in the last 168 hours. BNP (last 3 results) No results for input(s): PROBNP in the last 8760 hours. HbA1C: No results for input(s): HGBA1C in the last 72 hours. CBG: Recent Labs  Lab 11/10/17 1702 11/10/17 2351 11/11/17 0808  GLUCAP 154* 126* 106*   Lipid Profile: No results for input(s): CHOL, HDL, LDLCALC, TRIG, CHOLHDL, LDLDIRECT in the last 72 hours. Thyroid Function Tests: No results for input(s): TSH, T4TOTAL, FREET4, T3FREE, THYROIDAB in the last 72 hours. Anemia Panel: No results for input(s): VITAMINB12, FOLATE, FERRITIN, TIBC, IRON, RETICCTPCT in the last 72 hours. Urine analysis:    Component Value Date/Time   COLORURINE YELLOW 10/13/2017 1052   APPEARANCEUR HAZY (A) 10/13/2017 1052   LABSPEC 1.013 10/13/2017 1052   PHURINE 5.0 10/13/2017 1052   GLUCOSEU NEGATIVE 10/13/2017 1052   HGBUR LARGE (A) 10/13/2017 1052   BILIRUBINUR NEGATIVE 10/13/2017 1052   KETONESUR NEGATIVE 10/13/2017 1052   PROTEINUR 30 (A) 10/13/2017 1052   NITRITE NEGATIVE 10/13/2017 1052   LEUKOCYTESUR LARGE (A) 10/13/2017 1052   Sepsis Labs: _0 (procalcitonin:4,lacticidven:4)  )No results found for this or any previous visit (from the past 240 hour(s)).       Radiology Studies: Dg Chest 2 View  Result Date: 11/10/2017 CLINICAL DATA:  Increased confusion EXAM: CHEST  2 VIEW COMPARISON:  October 13, 2017 FINDINGS: The heart size and mediastinal contours are stable. The heart size is enlarged. The lateral diffuse increased pulmonary interstitium is identified. Consolidation of left lung base with associated left pleural effusion is noted. The visualized skeletal structures are unremarkable. IMPRESSION: Mild interstitial pulmonary edema. Consolidation left lung base, pneumonia is not excluded. Left pleural effusion. Electronically Signed   By: Abelardo Diesel M.D.   On: 11/10/2017  17:52        Scheduled Meds: . allopurinol  100 mg Oral Daily  . aspirin EC  325 mg Oral Daily  . brinzolamide  1 drop Both Eyes QHS  . finasteride  5 mg Oral Daily  . furosemide  40 mg Intravenous Q12H  . heparin  5,000 Units Subcutaneous Q8H  . insulin aspart  0-5 Units Subcutaneous QHS  . insulin aspart  0-9 Units Subcutaneous TID WC  . mirabegron ER  50 mg Oral Daily  . potassium chloride SA  40 mEq Oral BID  . sodium chloride flush  3 mL Intravenous Q12H  . tadalafil (PAH)  20 mg Oral Daily  . Travoprost (BAK Free)  1 drop Both Eyes QHS   Continuous Infusions: . sodium chloride    . ceFEPime (MAXIPIME) IV Stopped (11/10/17 2343)  . vancomycin       LOS: 1 day    Time spent: 66mn    PDomenic Polite MD [facility] Page via [email or web address], password [email or web address] After 7PM please contact night-coverage  11/11/2017, 11:25 AM

## 2017-11-11 NOTE — Progress Notes (Signed)
Patient is removing nasal oxygen probe and telemetry probe  frequently.Nurse explained to the patient to stay on complains.

## 2017-11-11 NOTE — Progress Notes (Signed)
  Echocardiogram 2D Echocardiogram has been performed.  [name] SavoyCasey N Lennon [name] 11/11/2017, 11:46 AM

## 2017-11-12 LAB — GLUCOSE, CAPILLARY
GLUCOSE-CAPILLARY: 109 mg/dL — AB (ref 65–99)
Glucose-Capillary: 157 mg/dL — ABNORMAL HIGH (ref 65–99)
Glucose-Capillary: 261 mg/dL — ABNORMAL HIGH (ref 65–99)

## 2017-11-12 LAB — BASIC METABOLIC PANEL
ANION GAP: 12 (ref 5–15)
BUN: 57 mg/dL — ABNORMAL HIGH (ref 6–20)
CALCIUM: 8.9 mg/dL (ref 8.9–10.3)
CO2: 27 mmol/L (ref 22–32)
Chloride: 103 mmol/L (ref 101–111)
Creatinine, Ser: 1.8 mg/dL — ABNORMAL HIGH (ref 0.61–1.24)
GFR, EST AFRICAN AMERICAN: 36 mL/min — AB (ref >60)
GFR, EST NON AFRICAN AMERICAN: 31 mL/min — AB (ref >60)
Glucose, Bld: 127 mg/dL — ABNORMAL HIGH (ref 65–99)
Potassium: 3.4 mmol/L — ABNORMAL LOW (ref 3.5–5.1)
Sodium: 142 mmol/L (ref 135–145)

## 2017-11-12 MED ORDER — ORAL CARE MOUTH RINSE
15.0000 mL | Freq: Two times a day (BID) | OROMUCOSAL | Status: DC
  Administered 2017-11-12 – 2017-11-20 (×12): 15 mL via OROMUCOSAL

## 2017-11-12 MED ORDER — IPRATROPIUM-ALBUTEROL 0.5-2.5 (3) MG/3ML IN SOLN
3.0000 mL | Freq: Three times a day (TID) | RESPIRATORY_TRACT | Status: DC
  Administered 2017-11-12 – 2017-11-13 (×2): 3 mL via RESPIRATORY_TRACT
  Filled 2017-11-12 (×3): qty 3

## 2017-11-12 MED ORDER — IPRATROPIUM-ALBUTEROL 0.5-2.5 (3) MG/3ML IN SOLN
3.0000 mL | Freq: Four times a day (QID) | RESPIRATORY_TRACT | Status: DC
  Administered 2017-11-12: 3 mL via RESPIRATORY_TRACT
  Filled 2017-11-12: qty 3

## 2017-11-12 MED ORDER — METHYLPREDNISOLONE SODIUM SUCC 125 MG IJ SOLR
60.0000 mg | Freq: Four times a day (QID) | INTRAMUSCULAR | Status: DC
  Administered 2017-11-12 – 2017-11-13 (×5): 60 mg via INTRAVENOUS
  Filled 2017-11-12 (×5): qty 2

## 2017-11-12 NOTE — Progress Notes (Signed)
The patient shows bilateral  respiratory wheezing on ascultation of lungs.

## 2017-11-12 NOTE — Progress Notes (Addendum)
PROGRESS NOTE    [name] [name]  NWG:[id number] DOB: 13-Nov-1924 DOA: 11/10/2017 PCP: Gaspar Garbe, MD  Brief Narrative: [name] [name] is a 84 y.o. male with medical history significant for paroxysmal atrial fibrillation no longer on anticoagulation, Dementia, chronic diastolic CHF, chronic kidney disease stage III, type 2 diabetes mellitus, dementia, and hypertension, now presenting to the emergency department for evaluation of increased confusion, swelling, and cough. He lives at home with caregivers. In emergency room noted to have pulmonary edema, pleural effusion  Assessment & Plan:   1. Acute on chronic diastolic CHF -Clinically appears volume overloaded -Chest x-ray with interstitial edema and pleural effusions exam with increased peripheral edema abdominal wall edema etc. -Last echo from 05/2017 with preserved ejection fraction -Continue IV Lasix today  2. Mild COPD exacerbation -He is wheezing today, will add Solu-Medrol and nebulizations -Antibiotics stopped, no evidence of healthcare associated pneumonia and pro-calcitonin is low  3. Paroxysmal atrial fibrillation -recurrent complete heart block -Chad/score is 5 -Recently taken off Eliquis due to recurrent falls, continue aspirin -Heart rate is controlled   4. CKD stage III -Creatinine is close to baseline, monitor with diuresis  5. Type II DM  - A1c was 8.4% last month  - Managed at home with Januvia, held on admission  -  stable, continue sliding scale  6. Dementia -stable  DVT prophylaxis: sq heparin  Code Status: DNR Family Communication no family at bedside disposition Plan:  to be determined, home with caregivers versus rehabilitation again    Procedures:   Antimicrobials:  Antibiotics Given (last 72 hours)    Date/Time Action Medication Dose Rate   11/10/17 1846 New Bag/Given   vancomycin (VANCOCIN) 1,750 mg in sodium chloride 0.9 % 500 mL IVPB 1,750 mg 250 mL/hr   11/10/17 2132 New  Bag/Given   ceFEPIme (MAXIPIME) 1 g in dextrose 5 % 50 mL IVPB 1 g 100 mL/hr      Subjective: -still coughing and slightly short of breath  Objective: Vitals:   11/11/17 2152 11/12/17 0533 11/12/17 0849 11/12/17 1000  BP: 120/64 132/79    Pulse: 67 (!) 57    Resp: 19 15    Temp: (!) 97.4 F (36.3 C) 97.6 F (36.4 C)    TempSrc: Oral Oral    SpO2: 100% 99% 99% 98%  Weight:  94.3 kg (207 lb 14.3 oz)    Height:        Intake/Output Summary (Last 24 hours) at 11/12/2017 1215 Last data filed at 11/12/2017 0900 Gross per 24 hour  Intake 380 ml  Output 1400 ml  Net -1020 ml   Filed Weights   11/10/17 2230 11/11/17 0513 11/12/17 0533  Weight: 95 kg (209 lb 7 oz) 95 kg (209 lb 7 oz) 94.3 kg (207 lb 14.3 oz)    Examination:  General exam:  elderly frail, chronically ill male, pleasant alert awake oriented 2  Respiratory system: findings basilar crackles  Cardiovascular system: S1 & S2 heard, RRR  Gastrointestinal system: Abdomen is nondistended, soft and nontender.Normal bowel sounds heard. Central nervous system: Alert and oriented. No focal neurological deficits. Extremities: 2+ edema Skin: No rashes, lesions or ulcers Psychiatry: Pleasant and appropriate    Data Reviewed:   CBC: Recent Labs  Lab 11/10/17 1715  WBC 5.8  HGB 11.5*  HCT 35.8*  MCV 89.1  PLT 154   Basic Metabolic Panel: Recent Labs  Lab 11/10/17 1715 11/11/17 0548 11/12/17 0508  NA 140 139 142  K 3.7 3.5  3.4*  CL 102 102 103  CO2 26 26 27   GLUCOSE 170* 128* 127*  BUN 60* 59* 57*  CREATININE 1.95* 1.85* 1.80*  CALCIUM 9.3 8.9 8.9   GFR: Estimated Creatinine Clearance: 30.4 mL/min (A) (by C-G formula based on SCr of 1.8 mg/dL (H)). Liver Function Tests: No results for input(s): AST, ALT, ALKPHOS, BILITOT, PROT, ALBUMIN in the last 168 hours. No results for input(s): LIPASE, AMYLASE in the last 168 hours. No results for input(s): AMMONIA in the last 168 hours. Coagulation Profile: No  results for input(s): INR, PROTIME in the last 168 hours. Cardiac Enzymes: No results for input(s): CKTOTAL, CKMB, CKMBINDEX, TROPONINI in the last 168 hours. BNP (last 3 results) No results for input(s): PROBNP in the last 8760 hours. HbA1C: No results for input(s): HGBA1C in the last 72 hours. CBG: Recent Labs  Lab 11/11/17 1235 11/11/17 1635 11/11/17 2125 11/12/17 0807 11/12/17 1205  GLUCAP 140* 107* 164* 109* 157*   Lipid Profile: No results for input(s): CHOL, HDL, LDLCALC, TRIG, CHOLHDL, LDLDIRECT in the last 72 hours. Thyroid Function Tests: No results for input(s): TSH, T4TOTAL, FREET4, T3FREE, THYROIDAB in the last 72 hours. Anemia Panel: No results for input(s): VITAMINB12, FOLATE, FERRITIN, TIBC, IRON, RETICCTPCT in the last 72 hours. Urine analysis:    Component Value Date/Time   COLORURINE YELLOW 10/13/2017 1052   APPEARANCEUR HAZY (A) 10/13/2017 1052   LABSPEC 1.013 10/13/2017 1052   PHURINE 5.0 10/13/2017 1052   GLUCOSEU NEGATIVE 10/13/2017 1052   HGBUR LARGE (A) 10/13/2017 1052   BILIRUBINUR NEGATIVE 10/13/2017 1052   KETONESUR NEGATIVE 10/13/2017 1052   PROTEINUR 30 (A) 10/13/2017 1052   NITRITE NEGATIVE 10/13/2017 1052   LEUKOCYTESUR LARGE (A) 10/13/2017 1052   Sepsis Labs: @LABRCNTIP (procalcitonin:4,lacticidven:4)  )No results found for this or any previous visit (from the past 240 hour(s)).       Radiology Studies: Dg Chest 2 View  Result Date: 11/11/2017 CLINICAL DATA:  Shortness of breath. EXAM: CHEST  2 VIEW COMPARISON:  11/10/2017 FINDINGS: Stable heart size. Stable moderate left pleural effusion with potential underlying left lower lobe pneumonia. Lung volumes are low. Cannot exclude mild pulmonary interstitial edema. IMPRESSION: Stable moderate left pleural effusion and potential underlying left lower lobe pneumonia. Cannot exclude mild interstitial edema. Electronically Signed   By: Irish Lack M.D.   On: 11/11/2017 12:19   Dg Chest 2  View  Result Date: 11/10/2017 CLINICAL DATA:  Increased confusion EXAM: CHEST  2 VIEW COMPARISON:  October 13, 2017 FINDINGS: The heart size and mediastinal contours are stable. The heart size is enlarged. The lateral diffuse increased pulmonary interstitium is identified. Consolidation of left lung base with associated left pleural effusion is noted. The visualized skeletal structures are unremarkable. IMPRESSION: Mild interstitial pulmonary edema. Consolidation left lung base, pneumonia is not excluded. Left pleural effusion. Electronically Signed   By: Sherian Rein M.D.   On: 11/10/2017 17:52        Scheduled Meds: . allopurinol  100 mg Oral Daily  . aspirin EC  325 mg Oral Daily  . brinzolamide  1 drop Both Eyes QHS  . finasteride  5 mg Oral Daily  . furosemide  40 mg Intravenous Q12H  . heparin  5,000 Units Subcutaneous Q8H  . insulin aspart  0-5 Units Subcutaneous QHS  . insulin aspart  0-9 Units Subcutaneous TID WC  . ipratropium-albuterol  3 mL Nebulization TID  . mouth rinse  15 mL Mouth Rinse BID  . methylPREDNISolone (SOLU-MEDROL) injection  60 mg Intravenous Q6H  . mirabegron ER  50 mg Oral Daily  . potassium chloride SA  40 mEq Oral BID  . sodium chloride flush  3 mL Intravenous Q12H  . tadalafil (PAH)  20 mg Oral Daily  . Travoprost (BAK Free)  1 drop Both Eyes QHS   Continuous Infusions: . sodium chloride       LOS: 2 days    Time spent: 35min    Zannie CovePreetha Nile Prisk, MD [facility] Page via [email or web address], password [email or web address] After 7PM please contact night-coverage  11/12/2017, 12:15 PM

## 2017-11-13 LAB — BASIC METABOLIC PANEL
Anion gap: 13 (ref 5–15)
BUN: 60 mg/dL — AB (ref 6–20)
CALCIUM: 8.9 mg/dL (ref 8.9–10.3)
CHLORIDE: 102 mmol/L (ref 101–111)
CO2: 24 mmol/L (ref 22–32)
CREATININE: 1.85 mg/dL — AB (ref 0.61–1.24)
GFR, EST AFRICAN AMERICAN: 35 mL/min — AB (ref >60)
GFR, EST NON AFRICAN AMERICAN: 30 mL/min — AB (ref >60)
Glucose, Bld: 244 mg/dL — ABNORMAL HIGH (ref 65–99)
Potassium: 3.6 mmol/L (ref 3.5–5.1)
SODIUM: 139 mmol/L (ref 135–145)

## 2017-11-13 LAB — GLUCOSE, CAPILLARY
GLUCOSE-CAPILLARY: 297 mg/dL — AB (ref 65–99)
GLUCOSE-CAPILLARY: 230 mg/dL — AB (ref 65–99)
Glucose-Capillary: 291 mg/dL — ABNORMAL HIGH (ref 65–99)
Glucose-Capillary: 189 mg/dL — ABNORMAL HIGH (ref 65–99)
Glucose-Capillary: 181 mg/dL — ABNORMAL HIGH (ref 65–99)

## 2017-11-13 MED ORDER — IPRATROPIUM-ALBUTEROL 0.5-2.5 (3) MG/3ML IN SOLN
3.0000 mL | RESPIRATORY_TRACT | Status: DC | PRN
  Administered 2017-11-14 – 2017-11-15 (×5): 3 mL via RESPIRATORY_TRACT
  Filled 2017-11-13 (×5): qty 3

## 2017-11-13 MED ORDER — METHYLPREDNISOLONE SODIUM SUCC 40 MG IJ SOLR
40.0000 mg | Freq: Two times a day (BID) | INTRAMUSCULAR | Status: AC
  Administered 2017-11-13: 40 mg via INTRAVENOUS
  Filled 2017-11-13: qty 1

## 2017-11-13 NOTE — Consult Note (Signed)
   [facility] CM Inpatient Consult   11/13/2017  [name] [name] 07/16/1925 [id number]  Patient screened for potential [facility] [facility] services for multiple admissions and re-admission. Patient is in the [place] of the [facility] services under patient's Medicare plan. Met with the patient and his son Jenny Reichmann at the bedside.  Patient is up in chair, pleasant, but a little impulsive about getting up and states, "If my son had one of those belts the therapist used can he walk me down the hall?"  Explained the safety precautions and to check with his nurse or therapist.  Chart review reveals patient is likely to discharge to skilled nursing facility.  Gave son a brochure and explained [facility] for post hospital follow up needed.  Son accepted brochure, 24 hour magnet nurse advise line and contact information.  For questions contact:   Natividad Brood, RN BSN [facility] Liaison  [phone] business mobile phone Toll free office [phone]

## 2017-11-13 NOTE — Progress Notes (Signed)
Physical Therapy Treatment Patient Details Name: [name] [name] MRN: [id number] DOB: Dec 19, 1924 Today's Date: 11/13/2017    History of Present Illness Pt is a 84 y/o male admitted secondary to cough, increased confusion, and swelling of bilateral hips and scrotum. Pt found to have HCAP and acute on chronic heart failure. PMH including but not limited to CKD Stage 3, DM, HTN, PAF, and dementia.    PT Comments    Pt progresses towards PT goals today, demonstrating sit-to-stand transfer and 125-ft ambulation with RW and min guard assist. Pt on 2L O2 throughout session with SpO2 ranging from 90-96% and HR ranging from 51-69 bpm. Pt demonstrated heavy wheezing during session (both at EOB and during ambulation), however reports no SOB. Current discharge plan remains appropriate. PT will follow acutely in order to maximize functional mobility while in hospital setting.    Follow Up Recommendations  SNF;Supervision/Assistance - 24 hour;Other (comment)     Equipment Recommendations  None recommended by PT    Recommendations for Other Services       Precautions / Restrictions Precautions Precautions: Fall Restrictions Weight Bearing Restrictions: No    Mobility  Bed Mobility Overal bed mobility: Needs Assistance Bed Mobility: Supine to Sit     Supine to sit: Min guard;HOB elevated     General bed mobility comments: Pt with increased time and effort and use of bed railing for bed mobility.   Transfers Overall transfer level: Needs assistance Equipment used: Rolling walker (2 wheeled) Transfers: Sit to/from Stand Sit to Stand: Min guard         General transfer comment: Pt a little unsteady in rising and requiring extra time for STS x1 from EOB.   Ambulation/Gait Ambulation/Gait assistance: Min guard Ambulation Distance (Feet): 125 Feet Assistive device: Rolling walker (2 wheeled) Gait Pattern/deviations: Antalgic;Trunk flexed;Decreased stride length;Step-through  pattern Gait velocity: decreased   General Gait Details: Pt on 2LO2 with SpO2 ranging from 90-96% and HR ranging between 51 and 69 bpm. Pt with antalgic gait and bending of right knee. When asked if pt was in pain, he reported no.    Stairs            Wheelchair Mobility    Modified Rankin (Stroke Patients Only)       Balance Overall balance assessment: Needs assistance Sitting-balance support: Feet supported;No upper extremity supported Sitting balance-Leahy Scale: Good Sitting balance - Comments: Pt demonstrates ability to shift weight in all directions in chair and lean heavily to left and right while PT places chair alarm without LOB.    Standing balance support: During functional activity;Bilateral upper extremity supported Standing balance-Leahy Scale: Poor Standing balance comment: Pt utilizes RW to maintain static and dynamic standing balance.                             Cognition Arousal/Alertness: Awake/alert Behavior During Therapy: WFL for tasks assessed/performed;Impulsive Overall Cognitive Status: No family/caregiver present to determine baseline cognitive functioning Area of Impairment: Orientation;Problem solving                 Orientation Level: Disoriented to;Situation           Problem Solving: Requires verbal cues;Difficulty sequencing General Comments: When asked why he was in the hospital, patient replied that he fell and broke his hip. Pt requiring verbal cues for safety with RW. Pt a bit impulsive this session, attempting to walk after therapist command to stop (could also be HOH).  Exercises      General Comments General comments (skin integrity, edema, etc.): Pt on 2LO2 with SpO2 ranging from 90-96% and HR ranging between 51 and 69 bpm.       Pertinent Vitals/Pain Pain Assessment: No/denies pain    Home Living                      Prior Function            PT Goals (current goals can now be  found in the care plan section) Progress towards PT goals: Progressing toward goals    Frequency    Min 3X/week      PT Plan Current plan remains appropriate    Co-evaluation              AM-PAC PT "6 Clicks" Daily Activity  Outcome Measure  Difficulty turning over in bed (including adjusting bedclothes, sheets and blankets)?: A Little Difficulty moving from lying on back to sitting on the side of the bed? : A Little Difficulty sitting down on and standing up from a chair with arms (e.g., wheelchair, bedside commode, etc,.)?: A Little Help needed moving to and from a bed to chair (including a wheelchair)?: A Little Help needed walking in hospital room?: A Little Help needed climbing 3-5 steps with a railing? : A Lot 6 Click Score: 17    End of Session Equipment Utilized During Treatment: Gait belt Activity Tolerance: Patient tolerated treatment well Patient left: with call bell/phone within reach;in chair;with chair alarm set   PT Visit Diagnosis: Other abnormalities of gait and mobility (R26.89)     Time: [id number] PT Time Calculation (min) (ACUTE ONLY): 29 min  Charges:  $Gait Training: 8-22 mins $Therapeutic Activity: 8-22 mins                    G Codes:       Reina Fuserin Bertrum Helmstetter, SPT   Reina Fuserin Adaya Garmany 11/13/2017, 3:32 PM

## 2017-11-13 NOTE — Progress Notes (Signed)
PROGRESS NOTE    [name] DockVernon E Toves  ZOX:[id number] DOB: 05-21-1925 DOA: 11/10/2017 PCP: Gaspar Garbeisovec, Richard W, MD  Brief Narrative: [name] [name] is a 84 y.o. male with medical history significant for paroxysmal atrial fibrillation no longer on anticoagulation, Dementia, chronic diastolic CHF, chronic kidney disease stage III, type 2 diabetes mellitus, dementia, and hypertension, now presenting to the emergency department for evaluation of increased confusion, swelling, and cough. He lives at home with caregivers. In emergency room noted to have pulmonary edema, pleural effusion  Assessment & Plan:   1. Acute on chronic diastolic CHF -Chest x-ray with interstitial edema and pleural effusions exam with increased peripheral edema abdominal wall edema etc. -Last echo from 05/2017 with preserved ejection fraction -Improving with diuresis, continue IV Lasix today -Urine output not accurate for first 24 hours hours however subsequently negative 2L -d/w pts cardiologist Dr.Patwardhan, he has d/w daughter regarding hospice, I called and d/w daughter [name] regarding consideration of Hospice at home, she is not completely on board with this, although realistic about his multitude of medical illnesses -will request Palliative consult for Goals of care  2. Mild COPD exacerbation vs Cardiac wheeze -trial of small dose of steroids given yesterday, -will taper off steroids, no wheeze today -continue nebs -Antibiotics stopped, no evidence of healthcare associated pneumonia and pro-calcitonin is low  3. Paroxysmal atrial fibrillation -recurrent complete heart block -Chad/score is 5 -Recently taken off Eliquis due to recurrent falls, continue aspirin -Heart rate is controlled   4. CKD stage III -Creatinine is close to baseline, monitor with diuresis  5. Type II DM  - A1c was 8.4% last month  - Managed at home with Januvia, held on admission  - stable, continue sliding scale  6.  Dementia -stable  DVT prophylaxis: sq heparin  Code Status: DNR Family Communication no family at bedside disposition Plan:  Likely home with Caregivers in 48-72hours   Procedures:   Antimicrobials:  Antibiotics Given (last 72 hours)    Date/Time Action Medication Dose Rate   11/10/17 1846 New Bag/Given   vancomycin (VANCOCIN) 1,750 mg in sodium chloride 0.9 % 500 mL IVPB 1,750 mg 250 mL/hr   11/10/17 2132 New Bag/Given   ceFEPIme (MAXIPIME) 1 g in dextrose 5 % 50 mL IVPB 1 g 100 mL/hr      Subjective: -feels better, breathing improving  Objective: Vitals:   11/13/17 0500 11/13/17 0537 11/13/17 0633 11/13/17 0822  BP:   109/61   Pulse:  69 (!) 59   Resp:  15    Temp:  97.7 F (36.5 C)    TempSrc:  Oral    SpO2:  97%  95%  Weight: 95 kg (209 lb 7 oz)     Height:        Intake/Output Summary (Last 24 hours) at 11/13/2017 1342 Last data filed at 11/13/2017 0900 Gross per 24 hour  Intake 610 ml  Output 1825 ml  Net -1215 ml   Filed Weights   11/11/17 0513 11/12/17 0533 11/13/17 0500  Weight: 95 kg (209 lb 7 oz) 94.3 kg (207 lb 14.3 oz) 95 kg (209 lb 7 oz)    Examination:  Gen: Awake, Alert,  elderly, frail chronically ill appearing male, oriented to self and place only HEENT: PERRLA, Neck supple, positive JVD Lungs: Bilateral fine crackles in lower lobes CVS: S1-S2/irregular Abd: soft, Non tender, non distended, BS present Extremities: 1+ edema Skin: Chronic skin changes  Psychiatry: Pleasant and appropriate    Data Reviewed:  CBC: Recent Labs  Lab 11/10/17 1715  WBC 5.8  HGB 11.5*  HCT 35.8*  MCV 89.1  PLT 154   Basic Metabolic Panel: Recent Labs  Lab 11/10/17 1715 11/11/17 0548 11/12/17 0508 11/13/17 0545  NA 140 139 142 139  K 3.7 3.5 3.4* 3.6  CL 102 102 103 102  CO2 26 26 27 24   GLUCOSE 170* 128* 127* 244*  BUN 60* 59* 57* 60*  CREATININE 1.95* 1.85* 1.80* 1.85*  CALCIUM 9.3 8.9 8.9 8.9   GFR: Estimated Creatinine  Clearance: 29.7 mL/min (A) (by C-G formula based on SCr of 1.85 mg/dL (H)). Liver Function Tests: No results for input(s): AST, ALT, ALKPHOS, BILITOT, PROT, ALBUMIN in the last 168 hours. No results for input(s): LIPASE, AMYLASE in the last 168 hours. No results for input(s): AMMONIA in the last 168 hours. Coagulation Profile: No results for input(s): INR, PROTIME in the last 168 hours. Cardiac Enzymes: No results for input(s): CKTOTAL, CKMB, CKMBINDEX, TROPONINI in the last 168 hours. BNP (last 3 results) No results for input(s): PROBNP in the last 8760 hours. HbA1C: No results for input(s): HGBA1C in the last 72 hours. CBG: Recent Labs  Lab 11/12/17 1205 11/12/17 1732 11/12/17 2307 11/13/17 0753 11/13/17 1152  GLUCAP 157* 261* 297* 230* 291*   Lipid Profile: No results for input(s): CHOL, HDL, LDLCALC, TRIG, CHOLHDL, LDLDIRECT in the last 72 hours. Thyroid Function Tests: No results for input(s): TSH, T4TOTAL, FREET4, T3FREE, THYROIDAB in the last 72 hours. Anemia Panel: No results for input(s): VITAMINB12, FOLATE, FERRITIN, TIBC, IRON, RETICCTPCT in the last 72 hours. Urine analysis:    Component Value Date/Time   COLORURINE YELLOW 10/13/2017 1052   APPEARANCEUR HAZY (A) 10/13/2017 1052   LABSPEC 1.013 10/13/2017 1052   PHURINE 5.0 10/13/2017 1052   GLUCOSEU NEGATIVE 10/13/2017 1052   HGBUR LARGE (A) 10/13/2017 1052   BILIRUBINUR NEGATIVE 10/13/2017 1052   KETONESUR NEGATIVE 10/13/2017 1052   PROTEINUR 30 (A) 10/13/2017 1052   NITRITE NEGATIVE 10/13/2017 1052   LEUKOCYTESUR LARGE (A) 10/13/2017 1052   Sepsis Labs: @LABRCNTIP (procalcitonin:4,lacticidven:4)  )No results found for this or any previous visit (from the past 240 hour(s)).       Radiology Studies: No results found.      Scheduled Meds: . allopurinol  100 mg Oral Daily  . aspirin EC  325 mg Oral Daily  . brinzolamide  1 drop Both Eyes QHS  . finasteride  5 mg Oral Daily  . furosemide  40 mg  Intravenous Q12H  . heparin  5,000 Units Subcutaneous Q8H  . insulin aspart  0-5 Units Subcutaneous QHS  . insulin aspart  0-9 Units Subcutaneous TID WC  . mouth rinse  15 mL Mouth Rinse BID  . methylPREDNISolone (SOLU-MEDROL) injection  40 mg Intravenous Q12H  . mirabegron ER  50 mg Oral Daily  . potassium chloride SA  40 mEq Oral BID  . sodium chloride flush  3 mL Intravenous Q12H  . tadalafil (PAH)  20 mg Oral Daily  . Travoprost (BAK Free)  1 drop Both Eyes QHS   Continuous Infusions: . sodium chloride       LOS: 3 days    Time spent:    Zannie Cove, MD [facility] Page via [email or web address], password [email or web address] After 7PM please contact night-coverage  11/13/2017, 1:42 PM

## 2017-11-14 LAB — BASIC METABOLIC PANEL
ANION GAP: 16 — AB (ref 5–15)
BUN: 75 mg/dL — AB (ref 6–20)
CALCIUM: 9 mg/dL (ref 8.9–10.3)
CO2: 21 mmol/L — AB (ref 22–32)
Chloride: 102 mmol/L (ref 101–111)
Creatinine, Ser: 2.12 mg/dL — ABNORMAL HIGH (ref 0.61–1.24)
GFR calc Af Amer: 30 mL/min — ABNORMAL LOW (ref >60)
GFR, EST NON AFRICAN AMERICAN: 26 mL/min — AB (ref >60)
GLUCOSE: 174 mg/dL — AB (ref 65–99)
Potassium: 4.3 mmol/L (ref 3.5–5.1)
Sodium: 139 mmol/L (ref 135–145)

## 2017-11-14 LAB — GLUCOSE, CAPILLARY
GLUCOSE-CAPILLARY: 240 mg/dL — AB (ref 65–99)
Glucose-Capillary: 190 mg/dL — ABNORMAL HIGH (ref 65–99)
Glucose-Capillary: 230 mg/dL — ABNORMAL HIGH (ref 65–99)
Glucose-Capillary: 227 mg/dL — ABNORMAL HIGH (ref 65–99)

## 2017-11-14 NOTE — Progress Notes (Signed)
PROGRESS NOTE    [name] [name]  ZOX:[id number] DOB: [birth year] DOA: 11/10/2017 PCP: Gaspar Garbeisovec, Richard W, MD  Brief Narrative: [name] [name] is a 84 y.o. male with medical history significant for paroxysmal atrial fibrillation no longer on anticoagulation, Dementia, chronic diastolic CHF, chronic kidney disease stage III, type 2 diabetes mellitus, dementia, and hypertension, now presenting to the emergency department for evaluation of increased confusion, swelling, and cough. He lives at home with caregivers. In emergency room noted to have pulmonary edema, pleural effusion. -improving with diuresis  Assessment & Plan:   1. Acute on chronic diastolic CHF -Chest x-ray with interstitial edema and pleural effusions exam with increased peripheral edema, abdominal wall edema etc. -Last echo from 05/2017 with preserved ejection fraction -Improving with diuresis, -Urine output not accurate for first 24 hours hours however subsequently negative 3L -bump in creat and BUN today and appears close to euvolemic, hold lasix today and resume by PO tomorrow -d/w pts cardiologist Dr.Patwardhan, he has d/w daughter regarding hospice, I called and d/w daughter [name] 1/14 regarding consideration of Hospice at home, she is not completely on board with this, although realistic about his multitude of medical illnesses -Requested Palliative consult for Goals of care, pending at this time  2. Mild COPD exacerbation vs Cardiac wheeze -trial of small dose of steroids given 1/12 -no wheeze then, tapered off steroids -continue nebs -Antibiotics stopped, no evidence of healthcare associated pneumonia and pro-calcitonin is low  3. Paroxysmal atrial fibrillation -recurrent complete heart block -Chad/score is 5 -Recently taken off Eliquis due to recurrent falls, continue aspirin -Heart rate is controlled   4. AKI on CKD stage III -slight worsening with diuresis, will hold lasix today and resume PO tomorrow,  Bmet in am  5. Type II DM  - A1c was 8.4% last month  - Managed at home with Januvia, held on admission  - stable, continue sliding scale  6. Dementia -stable  DVT prophylaxis: sq heparin  Code Status: DNR Family Communication no family at bedside, called and d/w dtr Aram Beechamynthia 1/14 disposition Plan:  Likely home with Caregivers in 24-48 hours   Procedures:   Antimicrobials:  Antibiotics Given (last 72 hours)    None      Subjective: -no complaints, in good spirits, breathing improving  Objective: Vitals:   11/13/17 1403 11/13/17 2151 11/14/17 0239 11/14/17 0522  BP: 117/62 (!) 121/59  123/72  Pulse: 66 61  (!) 59  Resp: 18 (!) 22  20  Temp: 98.2 F (36.8 C) (!) 97.5 F (36.4 C)  (!) 97.4 F (36.3 C)  TempSrc: Oral Oral  Oral  SpO2: 94% 97%  93%  Weight:   95.3 kg (210 lb)   Height:        Intake/Output Summary (Last 24 hours) at 11/14/2017 1340 Last data filed at 11/14/2017 1012 Gross per 24 hour  Intake 403 ml  Output 1500 ml  Net -1097 ml   Filed Weights   11/12/17 0533 11/13/17 0500 11/14/17 0239  Weight: 94.3 kg (207 lb 14.3 oz) 95 kg (209 lb 7 oz) 95.3 kg (210 lb)    Examination:  Gen: Awake, Alert, elderly, frail chronically ill-appearing and hard of hearing oriented to self and partially to place HEENT: No JVD noted Lungs: Trace basilar crackles CVS: S1-S2/regular rate rhythm Abd: soft, Non tender, non distended, BS present Extremities: Trace edema Skin: Chronic skin changes Psychiatry: Pleasant, poor judgment and insight    Data Reviewed:   CBC: Recent Labs  Lab  11/10/17 1715  WBC 5.8  HGB 11.5*  HCT 35.8*  MCV 89.1  PLT 154   Basic Metabolic Panel: Recent Labs  Lab 11/10/17 1715 11/11/17 0548 11/12/17 0508 11/13/17 0545 11/14/17 0351  NA 140 139 142 139 139  K 3.7 3.5 3.4* 3.6 4.3  CL 102 102 103 102 102  CO2 26 26 27 24  21*  GLUCOSE 170* 128* 127* 244* 174*  BUN 60* 59* 57* 60* 75*  CREATININE 1.95* 1.85* 1.80*  1.85* 2.12*  CALCIUM 9.3 8.9 8.9 8.9 9.0   GFR: Estimated Creatinine Clearance: 25.9 mL/min (A) (by C-G formula based on SCr of 2.12 mg/dL (H)). Liver Function Tests: No results for input(s): AST, ALT, ALKPHOS, BILITOT, PROT, ALBUMIN in the last 168 hours. No results for input(s): LIPASE, AMYLASE in the last 168 hours. No results for input(s): AMMONIA in the last 168 hours. Coagulation Profile: No results for input(s): INR, PROTIME in the last 168 hours. Cardiac Enzymes: No results for input(s): CKTOTAL, CKMB, CKMBINDEX, TROPONINI in the last 168 hours. BNP (last 3 results) No results for input(s): PROBNP in the last 8760 hours. HbA1C: No results for input(s): HGBA1C in the last 72 hours. CBG: Recent Labs  Lab 11/13/17 1152 11/13/17 1715 11/13/17 2149 11/14/17 0809 11/14/17 1231  GLUCAP 291* 189* 181* 190* 230*   Lipid Profile: No results for input(s): CHOL, HDL, LDLCALC, TRIG, CHOLHDL, LDLDIRECT in the last 72 hours. Thyroid Function Tests: No results for input(s): TSH, T4TOTAL, FREET4, T3FREE, THYROIDAB in the last 72 hours. Anemia Panel: No results for input(s): VITAMINB12, FOLATE, FERRITIN, TIBC, IRON, RETICCTPCT in the last 72 hours. Urine analysis:    Component Value Date/Time   COLORURINE YELLOW 10/13/2017 1052   APPEARANCEUR HAZY (A) 10/13/2017 1052   LABSPEC 1.013 10/13/2017 1052   PHURINE 5.0 10/13/2017 1052   GLUCOSEU NEGATIVE 10/13/2017 1052   HGBUR LARGE (A) 10/13/2017 1052   BILIRUBINUR NEGATIVE 10/13/2017 1052   KETONESUR NEGATIVE 10/13/2017 1052   PROTEINUR 30 (A) 10/13/2017 1052   NITRITE NEGATIVE 10/13/2017 1052   LEUKOCYTESUR LARGE (A) 10/13/2017 1052   Sepsis Labs: @LABRCNTIP (procalcitonin:4,lacticidven:4)  )No results found for this or any previous visit (from the past 240 hour(s)).       Radiology Studies: No results found.      Scheduled Meds: . allopurinol  100 mg Oral Daily  . aspirin EC  325 mg Oral Daily  . brinzolamide  1  drop Both Eyes QHS  . finasteride  5 mg Oral Daily  . heparin  5,000 Units Subcutaneous Q8H  . insulin aspart  0-5 Units Subcutaneous QHS  . insulin aspart  0-9 Units Subcutaneous TID WC  . mouth rinse  15 mL Mouth Rinse BID  . mirabegron ER  50 mg Oral Daily  . sodium chloride flush  3 mL Intravenous Q12H  . tadalafil (PAH)  20 mg Oral Daily  . Travoprost (BAK Free)  1 drop Both Eyes QHS   Continuous Infusions: . sodium chloride       LOS: 4 days    Time spent:    Zannie Cove, MD [facility] Page via [email or web address], password [email or web address] After 7PM please contact night-coverage  11/14/2017, 1:40 PM

## 2017-11-14 NOTE — Progress Notes (Signed)
No charge note:   Palliative consult received.   Spoke with patient's daughter, [name]. She is going to call back with a time to meet.   Thank you for allowing Palliative Medicine to participate in the care of this patient.   Ocie BobKasie Mahan, AGNP-C Palliative Medicine  Please call Palliative Medicine team phone with any questions [phone]. For individual providers please see AMION.

## 2017-11-15 DIAGNOSIS — N183 Chronic kidney disease, stage 3 (moderate): Secondary | ICD-10-CM

## 2017-11-15 DIAGNOSIS — J189 Pneumonia, unspecified organism: Secondary | ICD-10-CM

## 2017-11-15 DIAGNOSIS — Z7189 Other specified counseling: Secondary | ICD-10-CM

## 2017-11-15 DIAGNOSIS — Z515 Encounter for palliative care: Secondary | ICD-10-CM

## 2017-11-15 DIAGNOSIS — I5033 Acute on chronic diastolic (congestive) heart failure: Secondary | ICD-10-CM

## 2017-11-15 LAB — BASIC METABOLIC PANEL
ANION GAP: 14 (ref 5–15)
BUN: 76 mg/dL — AB (ref 6–20)
CO2: 23 mmol/L (ref 22–32)
Calcium: 9.1 mg/dL (ref 8.9–10.3)
Chloride: 102 mmol/L (ref 101–111)
Creatinine, Ser: 1.95 mg/dL — ABNORMAL HIGH (ref 0.61–1.24)
GFR calc Af Amer: 33 mL/min — ABNORMAL LOW (ref >60)
GFR, EST NON AFRICAN AMERICAN: 29 mL/min — AB (ref >60)
Glucose, Bld: 156 mg/dL — ABNORMAL HIGH (ref 65–99)
POTASSIUM: 3.6 mmol/L (ref 3.5–5.1)
Sodium: 139 mmol/L (ref 135–145)

## 2017-11-15 LAB — GLUCOSE, CAPILLARY
GLUCOSE-CAPILLARY: 135 mg/dL — AB (ref 65–99)
Glucose-Capillary: 129 mg/dL — ABNORMAL HIGH (ref 65–99)
Glucose-Capillary: 176 mg/dL — ABNORMAL HIGH (ref 65–99)
Glucose-Capillary: 138 mg/dL — ABNORMAL HIGH (ref 65–99)

## 2017-11-15 MED ORDER — POLYVINYL ALCOHOL 1.4 % OP SOLN
1.0000 [drp] | OPHTHALMIC | Status: DC | PRN
  Filled 2017-11-15: qty 15

## 2017-11-15 MED ORDER — BLISTEX MEDICATED EX OINT
1.0000 "application " | TOPICAL_OINTMENT | CUTANEOUS | Status: DC | PRN
  Filled 2017-11-15: qty 6.3

## 2017-11-15 MED ORDER — POTASSIUM CHLORIDE CRYS ER 20 MEQ PO TBCR
40.0000 meq | EXTENDED_RELEASE_TABLET | Freq: Two times a day (BID) | ORAL | Status: DC
  Administered 2017-11-15 – 2017-11-20 (×11): 40 meq via ORAL
  Filled 2017-11-15 (×12): qty 2

## 2017-11-15 MED ORDER — IPRATROPIUM-ALBUTEROL 0.5-2.5 (3) MG/3ML IN SOLN
3.0000 mL | Freq: Three times a day (TID) | RESPIRATORY_TRACT | Status: DC
  Administered 2017-11-15 – 2017-11-19 (×11): 3 mL via RESPIRATORY_TRACT
  Filled 2017-11-15 (×11): qty 3

## 2017-11-15 MED ORDER — IPRATROPIUM-ALBUTEROL 0.5-2.5 (3) MG/3ML IN SOLN
3.0000 mL | Freq: Four times a day (QID) | RESPIRATORY_TRACT | Status: DC
  Administered 2017-11-15: 3 mL via RESPIRATORY_TRACT
  Filled 2017-11-15: qty 3

## 2017-11-15 MED ORDER — MORPHINE SULFATE (CONCENTRATE) 10 MG/0.5ML PO SOLN
2.5000 mg | ORAL | Status: DC | PRN
  Administered 2017-11-15 – 2017-11-16 (×4): 2.6 mg via SUBLINGUAL
  Filled 2017-11-15 (×4): qty 0.5

## 2017-11-15 MED ORDER — SALINE SPRAY 0.65 % NA SOLN
1.0000 | NASAL | Status: DC | PRN
  Filled 2017-11-15: qty 44

## 2017-11-15 MED ORDER — PHENOL 1.4 % MT LIQD
1.0000 | OROMUCOSAL | Status: DC | PRN
  Administered 2017-11-15: 1 via OROMUCOSAL
  Filled 2017-11-15: qty 177

## 2017-11-15 MED ORDER — FUROSEMIDE 40 MG PO TABS
40.0000 mg | ORAL_TABLET | Freq: Two times a day (BID) | ORAL | Status: DC
  Administered 2017-11-15 – 2017-11-16 (×3): 40 mg via ORAL
  Filled 2017-11-15 (×3): qty 1

## 2017-11-15 MED ORDER — GUAIFENESIN-DM 100-10 MG/5ML PO SYRP
5.0000 mL | ORAL_SOLUTION | ORAL | Status: DC | PRN
Start: 2017-11-15 — End: 2017-11-20
  Administered 2017-11-16: 5 mL via ORAL
  Filled 2017-11-15: qty 5

## 2017-11-15 NOTE — Progress Notes (Signed)
PROGRESS NOTE    [name] [name]  WGN:[id number] DOB: 12-May-1925 DOA: 11/10/2017 PCP: [name] Garbe, MD  Brief Narrative: [name] [name] is a 84 y.o. male with medical history significant for paroxysmal atrial fibrillation no longer on anticoagulation, Dementia, chronic diastolic CHF, chronic kidney disease stage III, type 2 diabetes mellitus, dementia, and hypertension, now presenting to the emergency department for evaluation of increased confusion, swelling, and cough. He lives at home with caregivers. In emergency room noted to have pulmonary edema, pleural effusion. -improving with diuresis -Palliative consulted for Goals of care  Assessment & Plan:   1. Acute on chronic diastolic CHF -Chest x-ray with interstitial edema and pleural effusions exam with increased peripheral edema, abdominal wall edema etc. -Last echo from 05/2017 with preserved ejection fraction -Improving with diuresis, Urine output not accurate for first 24 hours hours however subsequently negative 3.3L -bump in creat and BUN yesterday lasix held, restart PO 40mg  BID today -appears close to euvolemic -d/w pts cardiologist [name] [name], he has brought up discussion of hospice with daughter, I called and d/w daughter [name] [name] 1/14 regarding consideration of Hospice at home, she is not completely on board with this, although realistic about his multitude of medical illnesses, I have requested Palliative consult for Goals of care -awaiting family meeting, recurrent hospitalizations with CHF/advanced age and multiple med issues  2. Mild Upper airway wheeze vs Cardiac wheeze -trial of small dose of steroids given 1/12, no change, tapered off -clinically this is all upper airway wheeze, possibly has some vocal cord dysfunction but asymptomatic and hence doesn't need workup for this -continue nebs PRN -Antibiotics stopped, no evidence of healthcare associated pneumonia and pro-calcitonin is low  3. Paroxysmal  atrial fibrillation -recurrent complete heart block -Chad/score is 5 -Recently taken off Eliquis due to recurrent falls, continue aspirin -Heart rate is controlled   4. AKI on CKD stage III -slight worsening with diuresis yesterday, held lasix x1 day, creatinine slightly better, resume PO lasix -Bmet in am  5. Type II DM  - A1c was 8.4% last month  - Managed at home with Januvia, held on admission  - stable, continue sliding scale  6. Dementia -stable  DVT prophylaxis: sq heparin  Code Status: DNR Family Communication no family at bedside, called and d/w dtr [name] [name] 1/14 disposition Plan:  Likely home with Caregivers in 24-48 hours   Procedures:   Antimicrobials:  Antibiotics Given (last 72 hours)    None      Subjective: -no complaints, in good spirits, breathing improving  Objective: Vitals:   11/15/17 0404 11/15/17 0433 11/15/17 0653 11/15/17 0843  BP:   120/76   Pulse: 66  62   Resp: (!) 22  20   Temp:   (!) 97.3 F (36.3 C)   TempSrc:      SpO2: 98%  97% 97%  Weight:  88.5 kg (195 lb)    Height:        Intake/Output Summary (Last 24 hours) at 11/15/2017 1234 Last data filed at 11/15/2017 0951 Gross per 24 hour  Intake 770 ml  Output 725 ml  Net 45 ml   Filed Weights   11/13/17 0500 11/14/17 0239 11/15/17 0433  Weight: 95 kg (209 lb 7 oz) 95.3 kg (210 lb) 88.5 kg (195 lb)    Examination:  Gen: Awake, Alert, elderly, frail chronically ill-appearing and hard of hearing oriented to self and partially to place HEENT: No JVD noted Lungs: Trace basilar crackles CVS: S1-S2/regular rate rhythm Abd: soft,  Non tender, non distended, BS present Extremities: Trace edema Skin: Chronic skin changes Psychiatry: Pleasant, poor judgment and insight    Data Reviewed:   CBC: Recent Labs  Lab 11/10/17 1715  WBC 5.8  HGB 11.5*  HCT 35.8*  MCV 89.1  PLT 154   Basic Metabolic Panel: Recent Labs  Lab 11/11/17 0548 11/12/17 0508 11/13/17 0545  11/14/17 0351 11/15/17 0530  NA 139 142 139 139 139  K 3.5 3.4* 3.6 4.3 3.6  CL 102 103 102 102 102  CO2 26 27 24  21* 23  GLUCOSE 128* 127* 244* 174* 156*  BUN 59* 57* 60* 75* 76*  CREATININE 1.85* 1.80* 1.85* 2.12* 1.95*  CALCIUM 8.9 8.9 8.9 9.0 9.1   GFR: Estimated Creatinine Clearance: 27.2 mL/min (A) (by C-G formula based on SCr of 1.95 mg/dL (H)). Liver Function Tests: No results for input(s): AST, ALT, ALKPHOS, BILITOT, PROT, ALBUMIN in the last 168 hours. No results for input(s): LIPASE, AMYLASE in the last 168 hours. No results for input(s): AMMONIA in the last 168 hours. Coagulation Profile: No results for input(s): INR, PROTIME in the last 168 hours. Cardiac Enzymes: No results for input(s): CKTOTAL, CKMB, CKMBINDEX, TROPONINI in the last 168 hours. BNP (last 3 results) No results for input(s): PROBNP in the last 8760 hours. HbA1C: No results for input(s): HGBA1C in the last 72 hours. CBG: Recent Labs  Lab 11/14/17 1231 11/14/17 1705 11/14/17 2156 11/15/17 0813 11/15/17 1219  GLUCAP 230* 227* 240* 129* 176*   Lipid Profile: No results for input(s): CHOL, HDL, LDLCALC, TRIG, CHOLHDL, LDLDIRECT in the last 72 hours. Thyroid Function Tests: No results for input(s): TSH, T4TOTAL, FREET4, T3FREE, THYROIDAB in the last 72 hours. Anemia Panel: No results for input(s): VITAMINB12, FOLATE, FERRITIN, TIBC, IRON, RETICCTPCT in the last 72 hours. Urine analysis:    Component Value Date/Time   COLORURINE YELLOW 10/13/2017 1052   APPEARANCEUR HAZY (A) 10/13/2017 1052   LABSPEC 1.013 10/13/2017 1052   PHURINE 5.0 10/13/2017 1052   GLUCOSEU NEGATIVE 10/13/2017 1052   HGBUR LARGE (A) 10/13/2017 1052   BILIRUBINUR NEGATIVE 10/13/2017 1052   KETONESUR NEGATIVE 10/13/2017 1052   PROTEINUR 30 (A) 10/13/2017 1052   NITRITE NEGATIVE 10/13/2017 1052   LEUKOCYTESUR LARGE (A) 10/13/2017 1052   Sepsis Labs: @LABRCNTIP (procalcitonin:4,lacticidven:4)  )No results found for  this or any previous visit (from the past 240 hour(s)).       Radiology Studies: No results found.      Scheduled Meds: . allopurinol  100 mg Oral Daily  . aspirin EC  325 mg Oral Daily  . brinzolamide  1 drop Both Eyes QHS  . finasteride  5 mg Oral Daily  . heparin  5,000 Units Subcutaneous Q8H  . insulin aspart  0-5 Units Subcutaneous QHS  . insulin aspart  0-9 Units Subcutaneous TID WC  . ipratropium-albuterol  3 mL Nebulization TID  . mouth rinse  15 mL Mouth Rinse BID  . mirabegron ER  50 mg Oral Daily  . sodium chloride flush  3 mL Intravenous Q12H  . tadalafil (PAH)  20 mg Oral Daily  . Travoprost (BAK Free)  1 drop Both Eyes QHS   Continuous Infusions: . sodium chloride       LOS: 5 days    Time spent: 35min    Zannie CovePreetha Anilah Huck, MD [facility] Page via [email or web address], password [email or web address] After 7PM please contact night-coverage  11/15/2017, 12:34 PM

## 2017-11-15 NOTE — Care Management Note (Signed)
Case Management Note  Patient Details  Name: [name] [name] MRN: [id number] Date of Birth: [birth year]  Subjective/Objective:   Presents with acute on chronic CHF,hx of atrial fib, Dementia, chronic diastolic CHF, CDK III, type 2 diabetes mellitus, dementia, and hypertension. Pt lives alone, however,  [facility]. Daughter states she will need prior notice of dad's d/c in order to arrange PCS in home for dad the day of d/c.Daughter states [facility] will provide pt's transportation to home @ d/c.    [name] [name] (Daughter)     [phone]       PCP; Richard [facility]  [facility] pending with [facility] care liaison today.... CM to f/ u with transitional care needs   Expected Discharge Date:    11/16/2017           Expected Discharge Plan:     In-House Referral:  Clinical Social Work  Discharge planning Services  CM Consult  Post Acute Care Choice:    Choice offered to:     DME Arranged:    DME Agency:     HH Arranged:    HH Agency:     Status of Service:  In process, will continue to follow  If discussed at Long Length of Stay Meetings, dates discussed:    Additional Comments:  [name] [name], [name] Say Hudson, RN 11/15/2017, 12:32 PM

## 2017-11-15 NOTE — Consult Note (Signed)
Consultation Note Date: 11/15/2017   Patient Name: [name] [name]  DOB: 09/13/1925  MRN: [id number]  Age / Sex: 84 y.o., male  PCP: Tisovec, Adelfa Kohichard W, MD Referring Physician: Zannie CoveJoseph, Preetha, MD  Reason for Consultation: Establishing goals of care  HPI/Patient Profile: 84 y.o. male  with past medical history of CHF, dementia, CKD III, DM2 HTN admitted on 11/10/2017 with  SOB, leg swelling. Workup reveals CHF exacerbation. Chest xray shows interstitial edema. He has had 4 admissions in the last 6 months for similar complaints. Palliative medicine consulted for goals of care.    Clinical Assessment and Goals of Care: Evaluated patient in bed. He was significantly SOB at rest, on oxygen. He was unable to discuss his illness at length- he understands he has "heart problems that makes it hard for him to breathe".  Attempted to discuss EOL wishes and GOC. He has a desire to be at home with caregivers. He doesn't engage much in specifics, this is expected with is history of dementia and his SOB. He does state that he would prefer to go home vs a SNF. And if its possible for him to spend more time at home than in a hospital that would be important to him as well. He expresses no concerns or fears of dying, says he is ready.  Daughter, [name] was unable to come in for face to face discussion due to having flu. She notes that several members of her family have been diagnosis with influenza.   I have reviewed medical records including EPIC notes, labs and imaging, assessed the patient and then spoke with patient's daughter via telephone to discuss diagnosis prognosis, GOC, EOL wishes, disposition and options.  I introduced Palliative Medicine as specialized medical care for people living with serious illness. It focuses on providing relief from the symptoms and stress of a serious illness. The goal is to improve quality  of life for both the patient and the family.  We discussed a brief life review of the patient. He is retired from working at [employer].   As far as functional and nutritional status - [name] has noted decline over the last year. He has required 24 hour caregiver assistance. Has SOB at rest. Has required increasing hospitalizations. [name] is concerned he is heading towards EOL.    We discussed their current illness and what it means in the larger context of their on-going co-morbidities.  Natural disease trajectory and expectations at EOL were discussed. [name] wants him to be at home as much as possible. She is concerned about the cost of 24hr care at home. She wants him not to suffer.   I attempted to elicit values and goals of care important to the patient. [name] feels that being at home and comfortable is most important.    Advanced directives, concepts specific to code status, and rehospitalization were considered and discussed. Patients is currently DNR status. If patient were able to be kept comfortable at home this would be their preference.   Discussed adding low  dose morphine prn for patient's SOB to assist with symptom management. [name] agrees with this.  Hospice and Palliative Care services outpatient were explained and offered. [name] would like to consider Hospice care assistance at home, offered Hospice referral, but [name] declined until tomorrow as she is not feeling well today.   Questions and concerns were addressed.    Primary Decision Maker NEXT OF KIN- daughter- [name]    SUMMARY OF RECOMMENDATIONS -Continue current level of care -PMT to call [name] again tomorrow -[name] hopeful for patient to d/c home with caregivers and possible Hospice -Morphine 2.5mg  solution concetrate q2hr prn SOB  Code Status/Advance Care Planning:  DNR  Palliative Prophylaxis:   Frequent assessment for SOB  Additional Recommendations (Limitations, Scope,  Preferences):  Minimize Medications   Prognosis:    < 6 months d/t adv CHF, frequent hospitalizations, dec functional status  Discharge Planning: To Be Determined - possibly home with hospice  Primary Diagnoses: Present on Admission: . Atrial fibrillation (HCC) . Chronic kidney disease, stage III (moderate) (HCC) . HTN (hypertension) . DM (diabetes mellitus), type 2 with renal complications (HCC) . HCAP (healthcare-associated pneumonia) . Acute on chronic diastolic CHF (congestive heart failure) (HCC)   I have reviewed the medical record, interviewed the patient and family, and examined the patient. The following aspects are pertinent.  Past Medical History:  Diagnosis Date  . BPH with elevated PSA   . Chest pain   . Chronic kidney disease, stage III (moderate) (HCC)   . DM (diabetes mellitus), type 2 with renal complications (HCC)   . ED (erectile dysfunction)   . Glaucoma   . Hyperlipidemia   . Hypertension   . Microalbuminuria   . Microalbuminuria 2013  . Obesity   . Osteoarthritis   . Prostatic hypertrophy, benign, with obstruction   . Renal calculus   . Sick sinus syndrome [facility])    Social History   Socioeconomic History  . Marital status: Married    Spouse name: None  . Number of children: None  . Years of education: None  . Highest education level: None  Social Needs  . Financial resource strain: None  . Food insecurity - worry: None  . Food insecurity - inability: None  . Transportation needs - medical: None  . Transportation needs - non-medical: None  Occupational History  . None  Tobacco Use  . Smoking status: Former Smoker    Types: Cigarettes  . Smokeless tobacco: Never Used  Substance and Sexual Activity  . Alcohol use: No  . Drug use: No  . Sexual activity: None  Other Topics Concern  . None  Social History Narrative  . None   Family History  Problem Relation Age of Onset  . Cancer Paternal Grandfather        STOMACH  . Cancer  Sister        MALE CANCER  . Cancer - Colon Sister   . Stroke Father   . Healthy Child   . Healthy Child   . Other Child        BRAIN DAMAGE AT BIRTH   Scheduled Meds: . allopurinol  100 mg Oral Daily  . aspirin EC  325 mg Oral Daily  . brinzolamide  1 drop Both Eyes QHS  . finasteride  5 mg Oral Daily  . furosemide  40 mg Oral BID  . heparin  5,000 Units Subcutaneous Q8H  . insulin aspart  0-5 Units Subcutaneous QHS  . insulin aspart  0-9 Units Subcutaneous TID  WC  . ipratropium-albuterol  3 mL Nebulization TID  . mouth rinse  15 mL Mouth Rinse BID  . mirabegron ER  50 mg Oral Daily  . potassium chloride  40 mEq Oral BID  . sodium chloride flush  3 mL Intravenous Q12H  . tadalafil (PAH)  20 mg Oral Daily  . Travoprost (BAK Free)  1 drop Both Eyes QHS   Continuous Infusions: . sodium chloride     PRN Meds:.sodium chloride, acetaminophen, ALPRAZolam, guaiFENesin, ipratropium-albuterol, morphine CONCENTRATE, ondansetron (ZOFRAN) IV, polyethylene glycol, sodium chloride flush Medications Prior to Admission:  Prior to Admission medications   Medication Sig Start Date End Date Taking? Authorizing Provider  allopurinol (ZYLOPRIM) 100 MG tablet Take 100 mg by mouth daily.   Yes [provider]  aspirin EC 325 MG tablet Take 325 mg by mouth daily.   Yes [provider]  brinzolamide (AZOPT) 1 % ophthalmic suspension Place 1 drop into both eyes at bedtime.    Yes [provider]  cefpodoxime (VANTIN) 200 MG tablet Take 1 tablet (200 mg total) by mouth daily. 10/16/17  Yes Narda Bonds, MD  finasteride (PROSCAR) 5 MG tablet Take 5 mg by mouth daily.   Yes [provider]  furosemide (LASIX) 40 MG tablet Take 1 tablet (40 mg total) by mouth 2 (two) times daily. 10/16/17  Yes Narda Bonds, MD  metolazone (ZAROXOLYN) 2.5 MG tablet Take 2.5 mg by mouth every other day. Filled 09-20-17   Yes [provider]  MYRBETRIQ 50 MG TB24 tablet  Take 50 mg by mouth daily. 11/27/15  Yes [provider]  polyethylene glycol (MIRALAX / GLYCOLAX) packet Take 17 g by mouth daily as needed for mild constipation. 10/16/17  Yes Narda Bonds, MD  potassium chloride SA (K-DUR,KLOR-CON) 20 MEQ tablet Take 40 mEq by mouth 2 (two) times daily.    Yes [provider]  sitaGLIPtin (JANUVIA) 25 MG tablet Take 1 tablet (25 mg total) by mouth daily. 10/16/17  Yes Narda Bonds, MD  tadalafil, PAH, (ADCIRCA) 20 MG tablet Take 1 tablet (20 mg total) by mouth daily. 10/17/17  Yes Narda Bonds, MD  travoprost, benzalkonium, (TRAVATAN) 0.004 % ophthalmic solution Place 1 drop into both eyes at bedtime.   Yes [provider]   No Known Allergies Review of Systems  Constitutional: Positive for activity change and fatigue.  Respiratory: Positive for shortness of breath and wheezing.     Physical Exam  Cardiovascular: Normal rate and regular rhythm.  Pulmonary/Chest: He has wheezes.  Increased WOB  Abdominal: Soft. Bowel sounds are normal.  Neurological: He is alert.  Skin: Skin is warm and dry. There is pallor.  Nursing note and vitals reviewed.   Vital Signs: BP 120/76 (BP Location: Left Arm)   Pulse 62   Temp (!) 97.3 F (36.3 C)   Resp 20   Ht 5\' 8"  (1.727 m)   Wt 88.5 kg (195 lb)   SpO2 97%   BMI 29.65 kg/m  Pain Assessment: No/denies pain   Pain Score: 0-No pain   SpO2: SpO2: 97 % O2 Device:SpO2: 97 % O2 Flow Rate: .O2 Flow Rate (L/min): 2 L/min  IO: Intake/output summary:   Intake/Output Summary (Last 24 hours) at 11/15/2017 1245 Last data filed at 11/15/2017 1240 Gross per 24 hour  Intake 770 ml  Output 1140 ml  Net -370 ml    LBM: Last BM Date: 11/14/17 Baseline Weight: Weight: 95 kg (209 lb 7  oz) Most recent weight: Weight: 88.5 kg (195 lb)     Palliative Assessment/Data:     Thank you for this consult. Palliative medicine will continue to follow and assist as needed.   Time In:  1200 Time Out:1330 Time Total: 90 minutes Prolonged services billed: Yes Greater than 50%  of this time was spent counseling and coordinating care related to the above assessment and plan.  Signed by: Ocie Bob, AGNP-C Palliative Medicine    Please contact Palliative Medicine Team phone at [phone] for questions and concerns.  For individual provider: See Loretha Stapler

## 2017-11-15 NOTE — Progress Notes (Signed)
Physical Therapy Treatment Patient Details Name: [name] [name] MRN: [id number] DOB: 11-Mar-1925 Today's Date: 11/15/2017    History of Present Illness Pt is a 84 y/o male admitted secondary to cough, increased confusion, and swelling of bilateral hips and scrotum. Pt found to have HCAP and acute on chronic heart failure. PMH including but not limited to CKD Stage 3, DM, HTN, PAF, and dementia.    PT Comments    Pt progressing with therapy at this time. Demonstrating increased independence with transfers this session and less impulsivity during session. Able to ambulate with min guard in hallway on 2-3L of supplemental O2 without rest breaks.  Pt denies pain or SOB, although wheezes and coughs throughout session. Current d/c recs appropriate, will continue to follow and progress as tolerated.    Follow Up Recommendations  SNF;Supervision/Assistance - 24 hour;Other (comment)     Equipment Recommendations  None recommended by PT    Recommendations for Other Services       Precautions / Restrictions Precautions Precautions: Fall Restrictions Weight Bearing Restrictions: No    Mobility  Bed Mobility Overal bed mobility: Needs Assistance Bed Mobility: Supine to Sit     Supine to sit: Min assist     General bed mobility comments: increased time and effort, min A to power up trunk via hand held assist to patient  Transfers Overall transfer level: Needs assistance Equipment used: Rolling walker (2 wheeled) Transfers: Sit to/from Stand Sit to Stand: Min guard         General transfer comment: Min guard for safety, patient able to rise from sitting to stand with slightly elevated surface without min physical assist at this time.   Ambulation/Gait Ambulation/Gait assistance: Min guard Ambulation Distance (Feet): 125 Feet Assistive device: Rolling walker (2 wheeled) Gait Pattern/deviations: Antalgic;Trunk flexed;Decreased stride length;Step-through pattern Gait  velocity: decreased   General Gait Details: SpO2 ranges 86-91% on 2L, remained above 90% on 3L throughout session. Cues for safety with RW as he allows it to travel too far ahead at times   Stairs            Wheelchair Mobility    Modified Rankin (Stroke Patients Only)       Balance Overall balance assessment: Needs assistance Sitting-balance support: Feet supported;No upper extremity supported Sitting balance-Leahy Scale: Good     Standing balance support: During functional activity;Bilateral upper extremity supported Standing balance-Leahy Scale: Poor Standing balance comment: Pt utilizes RW to maintain static and dynamic standing balance.                             Cognition Arousal/Alertness: Awake/alert Behavior During Therapy: WFL for tasks assessed/performed;Impulsive Overall Cognitive Status: No family/caregiver present to determine baseline cognitive functioning                           Safety/Judgement: Decreased awareness of safety;Decreased awareness of deficits     General Comments: Less impulsive this session. Followed commands with louder cues      Exercises      General Comments General comments (skin integrity, edema, etc.): HR: 61-88 during session. SpO2: 86-93% on 2-3L of O2      Pertinent Vitals/Pain Pain Assessment: No/denies pain    Home Living                      Prior Function  PT Goals (current goals can now be found in the care plan section) Acute Rehab PT Goals Patient Stated Goal: return home PT Goal Formulation: With patient Time For Goal Achievement: 11/25/17 Potential to Achieve Goals: Fair Progress towards PT goals: Progressing toward goals    Frequency    Min 3X/week      PT Plan Current plan remains appropriate    Co-evaluation              AM-PAC PT "6 Clicks" Daily Activity  Outcome Measure  Difficulty turning over in bed (including adjusting  bedclothes, sheets and blankets)?: A Little Difficulty moving from lying on back to sitting on the side of the bed? : A Little Difficulty sitting down on and standing up from a chair with arms (e.g., wheelchair, bedside commode, etc,.)?: A Little Help needed moving to and from a bed to chair (including a wheelchair)?: A Little Help needed walking in hospital room?: A Little Help needed climbing 3-5 steps with a railing? : A Lot 6 Click Score: 17    End of Session Equipment Utilized During Treatment: Gait belt;Oxygen Activity Tolerance: Patient tolerated treatment well Patient left: with call bell/phone within reach;in chair;with chair alarm set Nurse Communication: Mobility status PT Visit Diagnosis: Other abnormalities of gait and mobility (R26.89)     Time: [id number] PT Time Calculation (min) (ACUTE ONLY): 21 min  Charges:  $Gait Training: 8-22 mins                    G Codes:       Etta GrandchildSean Beverley Allender, PT, DPT Acute Rehab Services Pager: [phone]    Etta GrandchildSean  Machaela Caterino 11/15/2017, 8:45 AM

## 2017-11-15 NOTE — Care Management Important Message (Signed)
Important Message  Patient Details  Name: [name] E Fickling MRN: [id number] Date of Birth: [birth year]   Medicare Important Message Given:  Yes    Dorena BodoIris Bach Rocchi 11/15/2017, 2:32 PM

## 2017-11-16 DIAGNOSIS — R0602 Shortness of breath: Secondary | ICD-10-CM

## 2017-11-16 DIAGNOSIS — R0600 Dyspnea, unspecified: Secondary | ICD-10-CM

## 2017-11-16 LAB — GLUCOSE, CAPILLARY
GLUCOSE-CAPILLARY: 156 mg/dL — AB (ref 65–99)
Glucose-Capillary: 135 mg/dL — ABNORMAL HIGH (ref 65–99)
Glucose-Capillary: 153 mg/dL — ABNORMAL HIGH (ref 65–99)

## 2017-11-16 MED ORDER — BUDESONIDE 0.25 MG/2ML IN SUSP
0.2500 mg | Freq: Two times a day (BID) | RESPIRATORY_TRACT | Status: DC
  Administered 2017-11-16 – 2017-11-20 (×7): 0.25 mg via RESPIRATORY_TRACT
  Filled 2017-11-16 (×7): qty 2

## 2017-11-16 MED ORDER — FUROSEMIDE 10 MG/ML IJ SOLN: 40.0000 mg | Freq: Two times a day (BID) | INTRAMUSCULAR | Status: DC

## 2017-11-16 MED ORDER — FUROSEMIDE 10 MG/ML IJ SOLN
40.0000 mg | Freq: Two times a day (BID) | INTRAMUSCULAR | Status: DC
  Administered 2017-11-17 – 2017-11-18 (×4): 40 mg via INTRAVENOUS
  Filled 2017-11-16 (×5): qty 4

## 2017-11-16 NOTE — Progress Notes (Signed)
Daily Progress Note   Patient Name: [name] [name]       Date: 11/16/2017 DOB: 1925-04-21  Age: 84 y.o. MRN#: [id number] Attending Physician: Barnetta Chapelgbata, Sylvester I, MD Primary Care Physician: Gaspar Garbeisovec, Richard W, MD Admit Date: 11/10/2017  Reason for Consultation/Follow-up: Establishing goals of care  Subjective: Patient in bed asleep. Wakes to voice. Wheezing noted. Per nursing yesterday, had some relief with administration of SL morphine.  Spoke with Aram Beechamynthia via phone. She continues to suffer from influenza at home. She requests referral for Hospice support at home when patient discharged.   Review of Systems  Unable to perform ROS: Dementia    Length of Stay: 6  Current Medications: Scheduled Meds:  . allopurinol  100 mg Oral Daily  . aspirin EC  325 mg Oral Daily  . brinzolamide  1 drop Both Eyes QHS  . finasteride  5 mg Oral Daily  . furosemide  40 mg Oral BID  . heparin  5,000 Units Subcutaneous Q8H  . insulin aspart  0-5 Units Subcutaneous QHS  . insulin aspart  0-9 Units Subcutaneous TID WC  . ipratropium-albuterol  3 mL Nebulization TID  . mouth rinse  15 mL Mouth Rinse BID  . mirabegron ER  50 mg Oral Daily  . potassium chloride  40 mEq Oral BID  . sodium chloride flush  3 mL Intravenous Q12H  . tadalafil (PAH)  20 mg Oral Daily  . Travoprost (BAK Free)  1 drop Both Eyes QHS    Continuous Infusions: . sodium chloride      PRN Meds: sodium chloride, acetaminophen, ALPRAZolam, guaiFENesin-dextromethorphan, ipratropium-albuterol, lip balm, morphine CONCENTRATE, ondansetron (ZOFRAN) IV, phenol, polyethylene glycol, polyvinyl alcohol, sodium chloride, sodium chloride flush  Physical Exam  Pulmonary/Chest: No respiratory distress. He has wheezes.  Increased WOB    Abdominal: Soft.  Neurological:  Sleeping, arouses to voice  Skin: Skin is warm and dry.            Vital Signs: BP 123/70 (BP Location: Right Arm)   Pulse 70   Temp 98.2 F (36.8 C)   Resp 18   Ht 5\' 8"  (1.727 m)   Wt 84.4 kg (186 lb)   SpO2 95%   BMI 28.28 kg/m  SpO2: SpO2: 95 % O2 Device: O2 Device: Nasal Cannula O2 Flow Rate: O2 Flow  Rate (L/min): 1 L/min  Intake/output summary:   Intake/Output Summary (Last 24 hours) at 11/16/2017 1130 Last data filed at 11/16/2017 0930 Gross per 24 hour  Intake 880 ml  Output 2015 ml  Net -1135 ml   LBM: Last BM Date: 11/15/17 Baseline Weight: Weight: 95 kg (209 lb 7 oz) Most recent weight: Weight: 84.4 kg (186 lb)       Palliative Assessment/Data: PPS: 40%   Flowsheet Rows     Most Recent Value  Intake Tab  Referral Department  Hospitalist  Unit at Time of Referral  Med/Surg Unit  Palliative Care Primary Diagnosis  Nephrology  Date Notified  11/13/17  Palliative Care Type  New Palliative care  Reason for referral  Clarify Goals of Care  Date of Admission  11/10/17  Date first seen by Palliative Care  11/14/17  # of days Palliative referral response time  1 Day(s)  # of days IP prior to Palliative referral  3  Clinical Assessment  Psychosocial & Spiritual Assessment  Palliative Care Outcomes      Patient Active Problem List   Diagnosis Date Noted  . Advance care planning   . Goals of care, counseling/discussion   . Palliative care by specialist   . HCAP (healthcare-associated pneumonia) 11/10/2017  . Cellulitis and abscess of Bil leg/Rt > Lt 10/13/2017  . UTI (urinary tract infection) 10/13/2017  . Acute metabolic encephalopathy/UTI/AKI/Cellulitis 10/13/2017  . Acute-on-chronic kidney injury (HCC) 10/13/2017  . Encephalopathy 10/13/2017  . Hypokalemia 07/24/2017  . Closed intertrochanteric fracture of left femur (HCC)   . Bradycardia   . Closed left hip fracture, initial encounter (HCC) 06/21/2017  .  Hypotension 06/21/2017  . Syncope 06/21/2017  . Iron deficiency anemia 06/21/2017  . Elevated troponin 06/21/2017  . Closed fracture of left hip (HCC)   . Acute on chronic diastolic CHF (congestive heart failure) (HCC) 12/01/2016  . Atrial fibrillation (HCC) 02/24/2016  . Bilateral leg weakness 09/17/2015  . Chest pain   . DM (diabetes mellitus), type 2 with renal complications (HCC)   . Hypertension   . Hyperlipidemia   . Prostatic hypertrophy, benign, with obstruction   . Sick sinus syndrome (HCC)   . Obesity   . Microalbuminuria   . Glaucoma   . Renal calculus   . Osteoarthritis   . Chronic kidney disease, stage III (moderate) (HCC)   . BPH with elevated PSA   . ED (erectile dysfunction)   . RECTAL BLEEDING 04/22/2008  . DYSPHAGIA 04/22/2008  . HYPERLIPIDEMIA 04/18/2008  . HTN (hypertension) 04/18/2008  . ESOPHAGEAL STRICTURE 04/18/2008  . GERD 04/18/2008  . HIATAL HERNIA 04/18/2008  . DIVERTICULOSIS, COLON 04/18/2008  . COLONIC POLYPS, HYPERPLASTIC, HX OF 04/18/2008    Palliative Care Assessment & Plan   Patient Profile:   84 y.o. male  with past medical history of CHF, dementia, CKD III, DM2 HTN admitted on 11/10/2017 with  SOB, leg swelling. Workup reveals CHF exacerbation. Chest xray shows interstitial edema. He has had 4 admissions in the last 6 months for similar complaints. Palliative medicine consulted for goals of care.     Assessment/Recommendations/Plan   Continue Morphine 2.5mg  SL q2hr prn for SOB  Optimize as best can medically, then d/c home with Hospice support  Goals of Care and Additional Recommendations:  Limitations on Scope of Treatment: Avoid Hospitalization and Minimize Medications  Code Status:  DNR  Prognosis:   < 6 months due to progressing CHF aeb sig SOB at rest, increasing and frequent hospitalizations  Discharge Planning:  Home with Hospice  Care plan was discussed with daughter- [name].  Thank you for allowing the  Palliative Medicine Team to assist in the care of this patient.   Time In: 1045 Time Out: 1125 Total Time 40 mins Prolonged Time Billed No      Greater than 50%  of this time was spent counseling and coordinating care related to the above assessment and plan.  Ocie Bob, AGNP-C Palliative Medicine   Please contact Palliative Medicine Team phone at [phone] for questions and concerns.

## 2017-11-16 NOTE — Progress Notes (Signed)
PROGRESS NOTE    [name] [name]  ZOX:[id number] DOB: Jun 07, 1925 DOA: 11/10/2017 PCP: Gaspar Garbe, MD    Brief Narrative: [name] [name] is a 84 y.o. male with medical history significant for paroxysmal atrial fibrillation no longer on anticoagulation, Dementia, chronic diastolic CHF, chronic kidney disease stage III, type 2 diabetes mellitus, dementia, and hypertension, now presenting to the emergency department for evaluation of increased confusion, swelling, and cough. He lives at home with caregivers. In emergency room noted to have pulmonary edema, pleural effusion. -improving with diuresis -Palliative consulted for Goals of care  11/16/17 - Patient seen. Patient remains volume overloaded, and wheezing.  Assessment & Plan:   1. Acute on chronic diastolic CHF -Chest x-ray with interstitial edema and pleural effusions exam with increased peripheral edema, abdominal wall edema etc. -Last echo from 05/2017 with preserved ejection fraction -Improving with diuresis, Urine output not accurate for first 24 hours hours however subsequently negative 3.3L -bump in creat and BUN yesterday lasix held, restart PO 40mg  BID today -appears close to euvolemic -d/w pts cardiologist Dr.Patwardhan, he has brought up discussion of hospice with daughter, I called and d/w daughter [name] [name] 1/14 regarding consideration of Hospice at home, she is not completely on board with this, although realistic about his multitude of medical illnesses, I have requested Palliative consult for Goals of care -awaiting family meeting, recurrent hospitalizations with CHF/advanced age and multiple med issues - Change Lasix from PO to IV. Consider changing oral diuretic to torsemide on discharge.  2. Mild Upper airway wheeze vs Cardiac wheeze -trial of small dose of steroids given 1/12, no change, tapered off -clinically this is all upper airway wheeze, possibly has some vocal cord dysfunction but asymptomatic and  hence doesn't need workup for this -continue nebs PRN -Antibiotics stopped, no evidence of healthcare associated pneumonia and pro-calcitonin is low - Add Nebs Pulmicort  3. Paroxysmal atrial fibrillation -recurrent complete heart block -Chad/score is 5 -Recently taken off Eliquis due to recurrent falls, continue aspirin -Heart rate is controlled   4. AKI on CKD stage III -slight worsening with diuresis yesterday, held lasix x1 day, creatinine slightly better, resume PO lasix -Renal panel in am  5. Type II DM  - A1c was 8.4% last month  - Managed at home with Januvia, held on admission  - stable, continue sliding scale  6. Dementia -stable  DVT prophylaxis: sq heparin  Code Status: DNR Family Communication   disposition Plan:  Likely home with Caregivers in 24-48 hours   Procedures:   Antimicrobials:  Antibiotics Given (last 72 hours)    None      Subjective:  -SOB continues.   Objective: Vitals:   11/15/17 2144 11/16/17 0300 11/16/17 0507 11/16/17 1407  BP: 115/67  123/70 125/78  Pulse: 73  70 68  Resp: 18  18 (!) 24  Temp: 98.2 F (36.8 C)  98.2 F (36.8 C) (!) 97.3 F (36.3 C)  TempSrc: Oral     SpO2: 96%  95% 96%  Weight:  84.4 kg (186 lb)    Height:        Intake/Output Summary (Last 24 hours) at 11/16/2017 1824 Last data filed at 11/16/2017 0930 Gross per 24 hour  Intake 480 ml  Output 1200 ml  Net -720 ml   Filed Weights   11/14/17 0239 11/15/17 0433 11/16/17 0300  Weight: 95.3 kg (210 lb) 88.5 kg (195 lb) 84.4 kg (186 lb)    Examination:  Gen: Awake, Alert, elderly, frail chronically  ill-appearing and hard of hearing. Scrotal edema noted HEENT: No JVD noted Lungs: Wheezing. Decreased air entry. CVS: S1-S2/regular rate rhythm Abd: soft, Non tender, non distended, BS present Extremities: Edema of the extremities/Scrotal edema   Data Reviewed:   CBC: Recent Labs  Lab 11/10/17 1715  WBC 5.8  HGB 11.5*  HCT 35.8*  MCV 89.1    PLT 154   Basic Metabolic Panel: Recent Labs  Lab 11/11/17 0548 11/12/17 0508 11/13/17 0545 11/14/17 0351 11/15/17 0530  NA 139 142 139 139 139  K 3.5 3.4* 3.6 4.3 3.6  CL 102 103 102 102 102  CO2 26 27 24  21* 23  GLUCOSE 128* 127* 244* 174* 156*  BUN 59* 57* 60* 75* 76*  CREATININE 1.85* 1.80* 1.85* 2.12* 1.95*  CALCIUM 8.9 8.9 8.9 9.0 9.1   GFR: Estimated Creatinine Clearance: 26.6 mL/min (A) (by C-G formula based on SCr of 1.95 mg/dL (H)). Liver Function Tests: No results for input(s): AST, ALT, ALKPHOS, BILITOT, PROT, ALBUMIN in the last 168 hours. No results for input(s): LIPASE, AMYLASE in the last 168 hours. No results for input(s): AMMONIA in the last 168 hours. Coagulation Profile: No results for input(s): INR, PROTIME in the last 168 hours. Cardiac Enzymes: No results for input(s): CKTOTAL, CKMB, CKMBINDEX, TROPONINI in the last 168 hours. BNP (last 3 results) No results for input(s): PROBNP in the last 8760 hours. HbA1C: No results for input(s): HGBA1C in the last 72 hours. CBG: Recent Labs  Lab 11/15/17 1219 11/15/17 1628 11/15/17 2143 11/16/17 0831 11/16/17 1205  GLUCAP 176* 138* 135* 135* 153*   Lipid Profile: No results for input(s): CHOL, HDL, LDLCALC, TRIG, CHOLHDL, LDLDIRECT in the last 72 hours. Thyroid Function Tests: No results for input(s): TSH, T4TOTAL, FREET4, T3FREE, THYROIDAB in the last 72 hours. Anemia Panel: No results for input(s): VITAMINB12, FOLATE, FERRITIN, TIBC, IRON, RETICCTPCT in the last 72 hours. Urine analysis:    Component Value Date/Time   COLORURINE YELLOW 10/13/2017 1052   APPEARANCEUR HAZY (A) 10/13/2017 1052   LABSPEC 1.013 10/13/2017 1052   PHURINE 5.0 10/13/2017 1052   GLUCOSEU NEGATIVE 10/13/2017 1052   HGBUR LARGE (A) 10/13/2017 1052   BILIRUBINUR NEGATIVE 10/13/2017 1052   KETONESUR NEGATIVE 10/13/2017 1052   PROTEINUR 30 (A) 10/13/2017 1052   NITRITE NEGATIVE 10/13/2017 1052   LEUKOCYTESUR LARGE (A)  10/13/2017 1052   Sepsis Labs: @LABRCNTIP (procalcitonin:4,lacticidven:4)  )No results found for this or any previous visit (from the past 240 hour(s)).       Radiology Studies: No results found.      Scheduled Meds: . allopurinol  100 mg Oral Daily  . aspirin EC  325 mg Oral Daily  . brinzolamide  1 drop Both Eyes QHS  . finasteride  5 mg Oral Daily  . furosemide  40 mg Intravenous BID  . heparin  5,000 Units Subcutaneous Q8H  . insulin aspart  0-5 Units Subcutaneous QHS  . insulin aspart  0-9 Units Subcutaneous TID WC  . ipratropium-albuterol  3 mL Nebulization TID  . mouth rinse  15 mL Mouth Rinse BID  . mirabegron ER  50 mg Oral Daily  . potassium chloride  40 mEq Oral BID  . sodium chloride flush  3 mL Intravenous Q12H  . tadalafil (PAH)  20 mg Oral Daily  . Travoprost (BAK Free)  1 drop Both Eyes QHS   Continuous Infusions: . sodium chloride       LOS: 6 days    Time spent:  Berton MountSylvester Wilene Pharo, MD [facility] Page via [email or web address], password [email or web address] After 7PM please contact night-coverage  11/16/2017, 6:24 PM

## 2017-11-17 DIAGNOSIS — E1122 Type 2 diabetes mellitus with diabetic chronic kidney disease: Secondary | ICD-10-CM

## 2017-11-17 LAB — GLUCOSE, CAPILLARY
GLUCOSE-CAPILLARY: 148 mg/dL — AB (ref 65–99)
Glucose-Capillary: 132 mg/dL — ABNORMAL HIGH (ref 65–99)
Glucose-Capillary: 99 mg/dL (ref 65–99)
Glucose-Capillary: 148 mg/dL — ABNORMAL HIGH (ref 65–99)
Glucose-Capillary: 201 mg/dL — ABNORMAL HIGH (ref 65–99)

## 2017-11-17 LAB — CBC WITH DIFFERENTIAL/PLATELET
Basophils Absolute: 0 10*3/uL (ref 0.0–0.1)
Basophils Relative: 0 %
Eosinophils Absolute: 0.2 10*3/uL (ref 0.0–0.7)
Eosinophils Relative: 4 %
HCT: 34.9 % — ABNORMAL LOW (ref 39.0–52.0)
Hemoglobin: 11.2 g/dL — ABNORMAL LOW (ref 13.0–17.0)
Lymphocytes Relative: 9 %
Lymphs Abs: 0.4 10*3/uL — ABNORMAL LOW (ref 0.7–4.0)
MCH: 28.9 pg (ref 26.0–34.0)
MCHC: 32.1 g/dL (ref 30.0–36.0)
MCV: 90.2 fL (ref 78.0–100.0)
Monocytes Absolute: 0.6 10*3/uL (ref 0.1–1.0)
Monocytes Relative: 12 %
Neutro Abs: 3.6 10*3/uL (ref 1.7–7.7)
Neutrophils Relative %: 75 %
Platelets: 118 10*3/uL — ABNORMAL LOW (ref 150–400)
RBC: 3.87 MIL/uL — ABNORMAL LOW (ref 4.22–5.81)
RDW: 18 % — ABNORMAL HIGH (ref 11.5–15.5)
WBC: 4.9 10*3/uL (ref 4.0–10.5)

## 2017-11-17 LAB — RENAL FUNCTION PANEL
Albumin: 3.5 g/dL (ref 3.5–5.0)
Anion gap: 12 (ref 5–15)
BUN: 57 mg/dL — ABNORMAL HIGH (ref 6–20)
CO2: 25 mmol/L (ref 22–32)
Calcium: 8.8 mg/dL — ABNORMAL LOW (ref 8.9–10.3)
Chloride: 105 mmol/L (ref 101–111)
Creatinine, Ser: 1.45 mg/dL — ABNORMAL HIGH (ref 0.61–1.24)
GFR calc Af Amer: 47 mL/min — ABNORMAL LOW (ref >60)
GFR calc non Af Amer: 41 mL/min — ABNORMAL LOW (ref >60)
Glucose, Bld: 106 mg/dL — ABNORMAL HIGH (ref 65–99)
Phosphorus: 3 mg/dL (ref 2.5–4.6)
Potassium: 4.2 mmol/L (ref 3.5–5.1)
Sodium: 142 mmol/L (ref 135–145)

## 2017-11-17 LAB — BRAIN NATRIURETIC PEPTIDE: B Natriuretic Peptide: 736.7 pg/mL — ABNORMAL HIGH (ref 0.0–100.0)

## 2017-11-17 NOTE — Progress Notes (Signed)
Pt. Scrotum is swollen, the size of a small water mellon. MD aware, mentions that pt needs more diurectic to get fluid out. Pt scrotum is raised up with a fold tower to reduce the the discomfort. Pt states "I have no pain." when asked by nurse. Will continue to monitor.

## 2017-11-17 NOTE — Plan of Care (Signed)
Progressing

## 2017-11-17 NOTE — Progress Notes (Signed)
Physical Therapy Treatment Patient Details Name: [name] [name] MRN: [id number] DOB: [birth year] Today's Date: 11/17/2017    History of Present Illness Pt is a 84 y/o male admitted secondary to cough, increased confusion, and swelling of bilateral hips and scrotum. Pt found to have HCAP and acute on chronic heart failure. PMH including but not limited to CKD Stage 3, DM, HTN, PAF, and dementia.    PT Comments    D/C plan updated. Pt plans to discharge home with hospice. PT to follow acutely but no follow up services indicated. Pt ambulated 150 feet min guard assist with RW on 3 L O2 with SpO2 93%.   Follow Up Recommendations  No PT follow up;Supervision/Assistance - 24 hour     Equipment Recommendations  None recommended by PT    Recommendations for Other Services       Precautions / Restrictions Precautions Precautions: Fall;Other (comment) Precaution Comments: watch sats    Mobility  Bed Mobility         Supine to sit: Min assist;HOB elevated     General bed mobility comments: +rail, increased time and effort  Transfers   Equipment used: Rolling walker (2 wheeled)   Sit to Stand: Min guard         General transfer comment: cues for hand placement  Ambulation/Gait Ambulation/Gait assistance: Min guard Ambulation Distance (Feet): 150 Feet Assistive device: Rolling walker (2 wheeled) Gait Pattern/deviations: Step-through pattern;Decreased stride length Gait velocity: decreased   General Gait Details: Pt on 2 L O2 at rest with SpO2 92%. Pt ambulated on 3 L O2 with sats at 93%. Pt returned to 2 L upon sitting in recliner. Moderate SOB noted.   Stairs            Wheelchair Mobility    Modified Rankin (Stroke Patients Only)       Balance   Sitting-balance support: Feet supported;No upper extremity supported Sitting balance-Leahy Scale: Good     Standing balance support: During functional activity;Bilateral upper extremity  supported Standing balance-Leahy Scale: Poor                              Cognition Arousal/Alertness: Awake/alert Behavior During Therapy: WFL for tasks assessed/performed;Impulsive Overall Cognitive Status: No family/caregiver present to determine baseline cognitive functioning                                 General Comments: Pt very cooperative. Answers yes to most questions.      Exercises      General Comments        Pertinent Vitals/Pain Pain Assessment: No/denies pain    Home Living                      Prior Function            PT Goals (current goals can now be found in the care plan section) Acute Rehab PT Goals Patient Stated Goal: return home PT Goal Formulation: With patient Time For Goal Achievement: 11/25/17 Potential to Achieve Goals: Fair Progress towards PT goals: Progressing toward goals    Frequency    Min 3X/week      PT Plan Discharge plan needs to be updated    Co-evaluation              AM-PAC PT "6 Clicks" Daily Activity  Outcome Measure  Difficulty turning over in bed (including adjusting bedclothes, sheets and blankets)?: A Little Difficulty moving from lying on back to sitting on the side of the bed? : A Little Difficulty sitting down on and standing up from a chair with arms (e.g., wheelchair, bedside commode, etc,.)?: A Little Help needed moving to and from a bed to chair (including a wheelchair)?: A Little Help needed walking in hospital room?: A Little Help needed climbing 3-5 steps with a railing? : A Lot 6 Click Score: 17    End of Session Equipment Utilized During Treatment: Gait belt;Oxygen Activity Tolerance: Patient tolerated treatment well Patient left: in chair;with call bell/phone within reach;with chair alarm set Nurse Communication: Mobility status PT Visit Diagnosis: Other abnormalities of gait and mobility (R26.89)     Time: [id number] PT Time Calculation (min)  (ACUTE ONLY): 25 min  Charges:  $Gait Training: 23-37 mins                    G Codes:       Aida RaiderWendy Kalima Saylor, PT  Office # [phone] Pager [phone]    Ilda FoilGarrow, Kathleen Tamm Rene 11/17/2017, 10:03 AM

## 2017-11-17 NOTE — Progress Notes (Addendum)
Daily Progress Note   Patient Name: [name] [name]       Date: 11/17/2017 DOB: Oct 12, 1925  Age: 84 y.o. MRN#: [id number] Attending Physician: Barnetta Chapel, MD Primary Care Physician: Gaspar Garbe, MD Admit Date: 11/10/2017  Reason for Consultation/Follow-up: Establishing goals of care  Subjective: Pt sitting up in chair. Per primary note he remains fluid overloaded. No morphine given yesterday or today.   Review of Systems  Unable to perform ROS: Dementia    Length of Stay: 7  Current Medications: Scheduled Meds:  . allopurinol  100 mg Oral Daily  . aspirin EC  325 mg Oral Daily  . brinzolamide  1 drop Both Eyes QHS  . budesonide (PULMICORT) nebulizer solution  0.25 mg Nebulization BID  . finasteride  5 mg Oral Daily  . furosemide  40 mg Intravenous BID  . heparin  5,000 Units Subcutaneous Q8H  . insulin aspart  0-5 Units Subcutaneous QHS  . insulin aspart  0-9 Units Subcutaneous TID WC  . ipratropium-albuterol  3 mL Nebulization TID  . mouth rinse  15 mL Mouth Rinse BID  . mirabegron ER  50 mg Oral Daily  . potassium chloride  40 mEq Oral BID  . sodium chloride flush  3 mL Intravenous Q12H  . tadalafil (PAH)  20 mg Oral Daily  . Travoprost (BAK Free)  1 drop Both Eyes QHS    Continuous Infusions: . sodium chloride      PRN Meds: sodium chloride, acetaminophen, ALPRAZolam, guaiFENesin-dextromethorphan, ipratropium-albuterol, lip balm, morphine CONCENTRATE, ondansetron (ZOFRAN) IV, phenol, polyethylene glycol, polyvinyl alcohol, sodium chloride, sodium chloride flush  Physical Exam  Pulmonary/Chest: No respiratory distress. He has wheezes.  Increased WOB  Abdominal: Soft.  Neurological:  Sleeping, arouses to voice  Skin: Skin is warm and dry.      Vital Signs: BP 117/60 (BP Location: Right Arm)   Pulse 60   Temp 98 F (36.7 C) (Oral)   Resp 14   Ht 5\' 8"  (1.727 m)   Wt 95.5 kg (210 lb 9.6 oz)   SpO2 96%   BMI 32.02 kg/m  SpO2: SpO2: 96 % O2 Device: O2 Device: Nasal Cannula O2 Flow Rate: O2 Flow Rate (L/min): 1 L/min  Intake/output summary:   Intake/Output Summary (Last 24 hours) at 11/17/2017 1455 Last data filed at  11/17/2017 1450 Gross per 24 hour  Intake 778 ml  Output 4400 ml  Net -3622 ml   LBM: Last BM Date: 11/14/17 Baseline Weight: Weight: 95 kg (209 lb 7 oz) Most recent weight: Weight: 95.5 kg (210 lb 9.6 oz)       Palliative Assessment/Data: PPS: 40%   Flowsheet Rows     Most Recent Value  Intake Tab  Referral Department  Hospitalist  Unit at Time of Referral  Med/Surg Unit  Palliative Care Primary Diagnosis  Nephrology  Date Notified  11/13/17  Palliative Care Type  New Palliative care  Reason for referral  Clarify Goals of Care  Date of Admission  11/10/17  Date first seen by Palliative Care  11/14/17  # of days Palliative referral response time  1 Day(s)  # of days IP prior to Palliative referral  3  Clinical Assessment  Psychosocial & Spiritual Assessment  Palliative Care Outcomes      Patient Active Problem List   Diagnosis Date Noted  . Dyspnea   . Advance care planning   . Goals of care, counseling/discussion   . Palliative care by specialist   . HCAP (healthcare-associated pneumonia) 11/10/2017  . Cellulitis and abscess of Bil leg/Rt > Lt 10/13/2017  . UTI (urinary tract infection) 10/13/2017  . Acute metabolic encephalopathy/UTI/AKI/Cellulitis 10/13/2017  . Acute-on-chronic kidney injury (HCC) 10/13/2017  . Encephalopathy 10/13/2017  . Hypokalemia 07/24/2017  . Closed intertrochanteric fracture of left femur (HCC)   . Bradycardia   . Closed left hip fracture, initial encounter (HCC) 06/21/2017  . Hypotension 06/21/2017  . Syncope 06/21/2017  . Iron deficiency anemia  06/21/2017  . Elevated troponin 06/21/2017  . Closed fracture of left hip (HCC)   . Acute on chronic diastolic CHF (congestive heart failure) (HCC) 12/01/2016  . Atrial fibrillation (HCC) 02/24/2016  . Bilateral leg weakness 09/17/2015  . Chest pain   . DM (diabetes mellitus), type 2 with renal complications (HCC)   . Hypertension   . Hyperlipidemia   . Prostatic hypertrophy, benign, with obstruction   . Sick sinus syndrome (HCC)   . Obesity   . Microalbuminuria   . Glaucoma   . Renal calculus   . Osteoarthritis   . Chronic kidney disease, stage III (moderate) (HCC)   . BPH with elevated PSA   . ED (erectile dysfunction)   . RECTAL BLEEDING 04/22/2008  . DYSPHAGIA 04/22/2008  . HYPERLIPIDEMIA 04/18/2008  . HTN (hypertension) 04/18/2008  . ESOPHAGEAL STRICTURE 04/18/2008  . GERD 04/18/2008  . HIATAL HERNIA 04/18/2008  . DIVERTICULOSIS, COLON 04/18/2008  . COLONIC POLYPS, HYPERPLASTIC, HX OF 04/18/2008    Palliative Care Assessment & Plan   Patient Profile:   84 y.o. male  with past medical history of CHF, dementia, CKD III, DM2 HTN admitted on 11/10/2017 with  SOB, leg swelling. Workup reveals CHF exacerbation. Chest xray shows interstitial edema. He has had 4 admissions in the last 6 months for similar complaints. Palliative medicine consulted for goals of care.     Assessment/Recommendations/Plan   Continue Morphine 2.5mg  SL q2hr prn for SOB  Optimize as best can medically, then d/c home with Hospice support  Referral to care management was placed on 1/17 for hospice services at home when discharged- please contact patient's daughter- [name] to discuss  PMT will shadow for disposition/discharge- please call if further assistance is needed  Goals of Care and Additional Recommendations:  Limitations on Scope of Treatment: Avoid Hospitalization and Minimize Medications  Code Status:  DNR  Prognosis:   < 6 months due to progressing CHF aeb sig SOB at rest,  increasing and frequent hospitalizations  Discharge Planning:  Home with Hospice   Thank you for allowing the Palliative Medicine Team to assist in the care of this patient.   Time In: 0930 Time Out 0955 Total Time 25 mins Prolonged Time Billed No      Greater than 50%  of this time was spent counseling and coordinating care related to the above assessment and plan.  Ocie BobKasie Mahan, AGNP-C Palliative Medicine   Please contact Palliative Medicine Team phone at [phone] for questions and concerns.

## 2017-11-17 NOTE — Progress Notes (Signed)
PROGRESS NOTE    [name] [name]  ZOX:[id number] DOB: [birth year] DOA: 11/10/2017 PCP: Gaspar Garbe, MD    Brief Narrative: [name] [name] is a 84 y.o. male with medical history significant for paroxysmal atrial fibrillation no longer on anticoagulation, Dementia, chronic diastolic CHF, chronic kidney disease stage III, type 2 diabetes mellitus, dementia, and hypertension, now presenting to the emergency department for evaluation of increased confusion, swelling, and cough. He lives at home with caregivers. In emergency room noted to have pulmonary edema, pleural effusion. -improving with diuresis -Palliative consulted for Goals of care  11/17/17 - Patient seen. Patient remains volume overloaded, but improving. Penile and scrotal edema persists. Wheezing is improving. Patient will need to remain at the hospital for IV diuretics.  Assessment & Plan:   1. Acute on chronic diastolic CHF -Chest x-ray with interstitial edema and pleural effusions exam with increased peripheral edema, abdominal wall edema etc. -Last echo from 05/2017 with preserved ejection fraction -Improving with diuresis, Urine output not accurate for first 24 hours hours however subsequently negative 3.3L -bump in creat and BUN yesterday lasix held, restart PO 40mg  BID today -appears close to euvolemic -d/w pts cardiologist Dr.Patwardhan, he has brought up discussion of hospice with daughter, I called and d/w daughter [name] [name] 1/14 regarding consideration of Hospice at home, she is not completely on board with this, although realistic about his multitude of medical illnesses, I have requested Palliative consult for Goals of care -awaiting family meeting, recurrent hospitalizations with CHF/advanced age and multiple med issues - Continue IV Lasix.  2. Mild Upper airway wheeze vs Cardiac wheeze - Improving -trial of small dose of steroids given 1/12, no change, tapered off -clinically this is all upper airway wheeze,  possibly has some vocal cord dysfunction but asymptomatic and hence doesn't need workup for this -continue nebs PRN -Antibiotics stopped, no evidence of healthcare associated pneumonia and pro-calcitonin is low - Add Nebs Pulmicort  3. Paroxysmal atrial fibrillation -recurrent complete heart block -Chad/score is 5 -Recently taken off Eliquis due to recurrent falls, continue aspirin -Heart rate is controlled   4. AKI on CKD stage III - Improving. Scr is 1.45 today. Possible component of cardiorenal syndrome. -Renal panel in am  5. Type II DM  - A1c was 8.4% last month  - Managed at home with Januvia, held on admission  - stable, continue sliding scale  6. Dementia -stable  DVT prophylaxis: sq heparin  Code Status: DNR Family Communication   disposition Plan:  Likely DC when the volume status is optimized.   Procedures:   Antimicrobials:  Antibiotics Given (last 72 hours)    None      Subjective:  -SOB is improving Wheezing is improving.  Objective: Vitals:   11/17/17 0545 11/17/17 0750 11/17/17 1304 11/17/17 1417  BP:    117/60  Pulse:    60  Resp:    14  Temp:    98 F (36.7 C)  TempSrc:    Oral  SpO2:  95% 96%   Weight: 95.5 kg (210 lb 9.6 oz)     Height:        Intake/Output Summary (Last 24 hours) at 11/17/2017 1835 Last data filed at 11/17/2017 1450 Gross per 24 hour  Intake 778 ml  Output 2700 ml  Net -1922 ml   Filed Weights   11/15/17 0433 11/16/17 0300 11/17/17 0545  Weight: 88.5 kg (195 lb) 84.4 kg (186 lb) 95.5 kg (210 lb 9.6 oz)    Examination:  Gen: Awake, Alert, elderly, frail chronically ill-appearing and hard of hearing. Scrotal edema noted HEENT: No JVD noted Lungs: Decreased air entry, worse on the left posteriorly, with mild end expiatory wheeze. The wheezing has improved. CVS: S1-S2/regular rate rhythm Abd: soft, Non tender, non distended, BS present Extremities: Edema of the extremities/Scrotal and penile edema   Data  Reviewed:   CBC: Recent Labs  Lab 11/17/17 0439  WBC 4.9  NEUTROABS 3.6  HGB 11.2*  HCT 34.9*  MCV 90.2  PLT 118*   Basic Metabolic Panel: Recent Labs  Lab 11/12/17 0508 11/13/17 0545 11/14/17 0351 11/15/17 0530 11/17/17 0439  NA 142 139 139 139 142  K 3.4* 3.6 4.3 3.6 4.2  CL 103 102 102 102 105  CO2 27 24 21* 23 25  GLUCOSE 127* 244* 174* 156* 106*  BUN 57* 60* 75* 76* 57*  CREATININE 1.80* 1.85* 2.12* 1.95* 1.45*  CALCIUM 8.9 8.9 9.0 9.1 8.8*  PHOS  --   --   --   --  3.0   GFR: Estimated Creatinine Clearance: 37.9 mL/min (A) (by C-G formula based on SCr of 1.45 mg/dL (H)). Liver Function Tests: Recent Labs  Lab 11/17/17 0439  ALBUMIN 3.5   No results for input(s): LIPASE, AMYLASE in the last 168 hours. No results for input(s): AMMONIA in the last 168 hours. Coagulation Profile: No results for input(s): INR, PROTIME in the last 168 hours. Cardiac Enzymes: No results for input(s): CKTOTAL, CKMB, CKMBINDEX, TROPONINI in the last 168 hours. BNP (last 3 results) No results for input(s): PROBNP in the last 8760 hours. HbA1C: No results for input(s): HGBA1C in the last 72 hours. CBG: Recent Labs  Lab 11/16/17 1649 11/16/17 2053 11/17/17 0750 11/17/17 1214 11/17/17 1659  GLUCAP 132* 156* 99 148* 201*   Lipid Profile: No results for input(s): CHOL, HDL, LDLCALC, TRIG, CHOLHDL, LDLDIRECT in the last 72 hours. Thyroid Function Tests: No results for input(s): TSH, T4TOTAL, FREET4, T3FREE, THYROIDAB in the last 72 hours. Anemia Panel: No results for input(s): VITAMINB12, FOLATE, FERRITIN, TIBC, IRON, RETICCTPCT in the last 72 hours. Urine analysis:    Component Value Date/Time   COLORURINE YELLOW 10/13/2017 1052   APPEARANCEUR HAZY (A) 10/13/2017 1052   LABSPEC 1.013 10/13/2017 1052   PHURINE 5.0 10/13/2017 1052   GLUCOSEU NEGATIVE 10/13/2017 1052   HGBUR LARGE (A) 10/13/2017 1052   BILIRUBINUR NEGATIVE 10/13/2017 1052   KETONESUR NEGATIVE 10/13/2017  1052   PROTEINUR 30 (A) 10/13/2017 1052   NITRITE NEGATIVE 10/13/2017 1052   LEUKOCYTESUR LARGE (A) 10/13/2017 1052   Sepsis Labs: @LABRCNTIP (procalcitonin:4,lacticidven:4)  )No results found for this or any previous visit (from the past 240 hour(s)).       Radiology Studies: No results found.      Scheduled Meds: . allopurinol  100 mg Oral Daily  . aspirin EC  325 mg Oral Daily  . brinzolamide  1 drop Both Eyes QHS  . budesonide (PULMICORT) nebulizer solution  0.25 mg Nebulization BID  . finasteride  5 mg Oral Daily  . furosemide  40 mg Intravenous BID  . heparin  5,000 Units Subcutaneous Q8H  . insulin aspart  0-5 Units Subcutaneous QHS  . insulin aspart  0-9 Units Subcutaneous TID WC  . ipratropium-albuterol  3 mL Nebulization TID  . mouth rinse  15 mL Mouth Rinse BID  . mirabegron ER  50 mg Oral Daily  . potassium chloride  40 mEq Oral BID  . sodium chloride flush  3 mL  Intravenous Q12H  . tadalafil (PAH)  20 mg Oral Daily  . Travoprost (BAK Free)  1 drop Both Eyes QHS   Continuous Infusions: . sodium chloride       LOS: 7 days    Time spent:    Berton Mount, MD [facility] Page via [email or web address], password [email or web address] After 7PM please contact night-coverage  11/17/2017, 6:35 PM

## 2017-11-18 LAB — GLUCOSE, CAPILLARY
GLUCOSE-CAPILLARY: 134 mg/dL — AB (ref 65–99)
Glucose-Capillary: 180 mg/dL — ABNORMAL HIGH (ref 65–99)
Glucose-Capillary: 131 mg/dL — ABNORMAL HIGH (ref 65–99)
Glucose-Capillary: 117 mg/dL — ABNORMAL HIGH (ref 65–99)

## 2017-11-18 NOTE — Care Management Note (Signed)
Case Management Note  Patient Details  Name: [name] E Rappaport MRN: [id number] Date of Birth: 06-22-1925  Subjective/Objective:           Consult receive for arranging home hospice services.   Spoke with daughter, [name], on the phone [phone]).  She is interested in pursuing home hospice services and chose [facility] from list.    [name] has the flu with fever still today, so will not be able to visit.  [name] reinforces that she will need notice of d/c to [facility].  She states that she is in favor of comfort care and withdrawing some of the medications that are aggressive treatment of chronic/terminal conditions.        Action/Plan: Amy, with [facility], contacted and will contact Aram Beechamynthia.   Expected Discharge Date:                  Expected Discharge Plan:  Home w Home Health Services  In-House Referral:  Clinical Social Work  Discharge planning Services  CM Consult  Post Acute Care Choice:  Home Hospice Choice offered to:  Adult Children  DME Arranged:    DME Agency:     HH Arranged:    HH Agency: [facility]  Status of Service:  In process, will continue to follow  If discussed at Long Length of Stay Meetings, dates discussed:    Additional Comments:  Verdene LennertGoldean, Jerritt Cardoza K, RN 11/18/2017, 9:49 AM

## 2017-11-18 NOTE — Progress Notes (Addendum)
[facility] Hospital Liaison:  RN visit  Notified by Morrie SheldonAshley, [facility], of patient/family request [facility] after discharge.  Chart and patient information reviewed by [facility].  Hospice eligibility approved.  Writer spoke with [name], daughter, over the phone to initiate education related to hospice philosophy, services and team approach to care.  Family verbalized understanding of information given.  Per discussion, plan is for discharge to home on 11/20/17 by PT[facility].   Please send signed and completed DNR form home with patient/family.  Patient will need prescriptions for discharge comfort medications.   DME needs have been discussed, patient currently has the following equipment in the home:  O2 concentrator, walker, raised toilet seat, safe tub, wheelchair.  Family declines need for additional equipment at this time.    [facility] Referral Center aware of the above.  Completed discharge summary will need to be [facility] at [phone], when final.  Please notify [facility] when patient is ready to leave the unit at discharge.  Call [phone] or [phone] after 5pm and on weekends.  [facility] information and contact numbers given to patient (left in room) during this visit.  Above information shared with Morrie Sheldonshley, [facility]RN.  Please call with any hospice related questions.  Thank you for this referral.  Adele BarthelAmy Evans, RN, BSN [facility] Hospital Liaison [phone]  All hospital liaisons are now on AMION.

## 2017-11-18 NOTE — Progress Notes (Signed)
Wean patient to RA, sats currently 96%.

## 2017-11-18 NOTE — Progress Notes (Signed)
PROGRESS NOTE    [name] [name]  ZOX:[id number] DOB: [birth year] DOA: 11/10/2017 PCP: [name] Garbeisovec, Richard W, MD    Brief Narrative: [name] [name] is a 84 y.o. male with medical history significant for paroxysmal atrial fibrillation no longer on anticoagulation, Dementia, chronic diastolic CHF, chronic kidney disease stage III, type 2 diabetes mellitus, dementia, and hypertension, now presenting to the emergency department for evaluation of increased confusion, swelling, and cough. He lives at home with caregivers. In emergency room noted to have pulmonary edema, pleural effusion. -improving with diuresis -Palliative consulted for Goals of care  11/17/17 - Patient seen. Patient remains volume overloaded, but improving. Penile and scrotal edema persists. Wheezing is improving. Patient will need to remain at the hospital for IV diuretics.  11/18/17 - patient seen. Edema is slowly improving. No new complaints. Will continue care as per patient and patient's family wish.  Assessment & Plan:   1. Acute on chronic diastolic CHF -Chest x-ray with interstitial edema and pleural effusions exam with increased peripheral edema, abdominal wall edema etc. -Last echo from 05/2017 with preserved ejection fraction -Improving with diuresis, Urine output not accurate for first 24 hours hours however subsequently negative 3.3L -bump in creat and BUN yesterday lasix held, restart PO 40mg  BID today -appears close to euvolemic -d/[name] pts cardiologist [name] [name], he has brought up discussion of hospice with daughter, I called and d/[name] daughter [name] [name] 1/14 regarding consideration of Hospice at home, she is not completely on board with this, although realistic about his multitude of medical illnesses, I have requested Palliative consult for Goals of care -awaiting family meeting, recurrent hospitalizations with CHF/advanced age and multiple med issues - Continue IV Lasix.  2. Mild Upper airway wheeze vs Cardiac  wheeze - Improving -trial of small dose of steroids given 1/12, no change, tapered off -clinically this is all upper airway wheeze, possibly has some vocal cord dysfunction but asymptomatic and hence doesn't need workup for this -continue nebs PRN -Antibiotics stopped, no evidence of healthcare associated pneumonia and pro-calcitonin is low - Add Nebs Pulmicort  3. Paroxysmal atrial fibrillation -recurrent complete heart block -Chad/score is 5 -Recently taken off Eliquis due to recurrent falls, continue aspirin -Heart rate is controlled   4. AKI on CKD stage III - Improving. Scr is 1.45 today. Possible component of cardiorenal syndrome. -Renal panel in am  5. Type II DM  - A1c was 8.4% last month  - Managed at home with Januvia, held on admission  - stable, continue sliding scale  6. Dementia -stable  DVT prophylaxis: sq heparin  Code Status: DNR Family Communication   disposition Plan:  Likely DC when the volume status is optimized.   Procedures:   Antimicrobials:  Antibiotics Given (last 72 hours)    None      Subjective:  -SOB is improving   Objective: Vitals:   11/18/17 0443 11/18/17 0512 11/18/17 1434 11/18/17 1502  BP: (!) 110/52   (!) 119/53  Pulse: 61   61  Resp: 20   18  Temp: 97.6 F (36.4 C)   97.6 F (36.4 C)  TempSrc: Oral     SpO2: 96%  97% 96%  Weight:  92.5 kg (204 lb)    Height:        Intake/Output Summary (Last 24 hours) at 11/18/2017 1733 Last data filed at 11/18/2017 1000 Gross per 24 hour  Intake 1420 ml  Output 2776 ml  Net -1356 ml   Filed Weights   11/16/17 0300 11/17/17 0545  11/18/17 0512  Weight: 84.4 kg (186 lb) 95.5 kg (210 lb 9.6 oz) 92.5 kg (204 lb)    Examination:  Gen: Awake, Alert, elderly, frail chronically ill-appearing and hard of hearing. Scrotal edema noted HEENT: No JVD noted Lungs: Decreased air entry, worse on the left posteriorly, with mild end expiatory wheeze. The wheezing has improved. CVS:  S1-S2/regular rate rhythm Abd: soft, Non tender, non distended, BS present Extremities: Edema of the extremities/Scrotal and penile edema - Slowly improving.  Data Reviewed:   CBC: Recent Labs  Lab 11/17/17 0439  WBC 4.9  NEUTROABS 3.6  HGB 11.2*  HCT 34.9*  MCV 90.2  PLT 118*   Basic Metabolic Panel: Recent Labs  Lab 11/12/17 0508 11/13/17 0545 11/14/17 0351 11/15/17 0530 11/17/17 0439  NA 142 139 139 139 142  K 3.4* 3.6 4.3 3.6 4.2  CL 103 102 102 102 105  CO2 27 24 21* 23 25  GLUCOSE 127* 244* 174* 156* 106*  BUN 57* 60* 75* 76* 57*  CREATININE 1.80* 1.85* 2.12* 1.95* 1.45*  CALCIUM 8.9 8.9 9.0 9.1 8.8*  PHOS  --   --   --   --  3.0   GFR: Estimated Creatinine Clearance: 37.4 mL/min (A) (by C-G formula based on SCr of 1.45 mg/dL (H)). Liver Function Tests: Recent Labs  Lab 11/17/17 0439  ALBUMIN 3.5   No results for input(s): LIPASE, AMYLASE in the last 168 hours. No results for input(s): AMMONIA in the last 168 hours. Coagulation Profile: No results for input(s): INR, PROTIME in the last 168 hours. Cardiac Enzymes: No results for input(s): CKTOTAL, CKMB, CKMBINDEX, TROPONINI in the last 168 hours. BNP (last 3 results) No results for input(s): PROBNP in the last 8760 hours. HbA1C: No results for input(s): HGBA1C in the last 72 hours. CBG: Recent Labs  Lab 11/17/17 1659 11/17/17 2250 11/18/17 0809 11/18/17 1232 11/18/17 1659  GLUCAP 201* 148* 134* 180* 131*   Lipid Profile: No results for input(s): CHOL, HDL, LDLCALC, TRIG, CHOLHDL, LDLDIRECT in the last 72 hours. Thyroid Function Tests: No results for input(s): TSH, T4TOTAL, FREET4, T3FREE, THYROIDAB in the last 72 hours. Anemia Panel: No results for input(s): VITAMINB12, FOLATE, FERRITIN, TIBC, IRON, RETICCTPCT in the last 72 hours. Urine analysis:    Component Value Date/Time   COLORURINE YELLOW 10/13/2017 1052   APPEARANCEUR HAZY (A) 10/13/2017 1052   LABSPEC 1.013 10/13/2017 1052    PHURINE 5.0 10/13/2017 1052   GLUCOSEU NEGATIVE 10/13/2017 1052   HGBUR LARGE (A) 10/13/2017 1052   BILIRUBINUR NEGATIVE 10/13/2017 1052   KETONESUR NEGATIVE 10/13/2017 1052   PROTEINUR 30 (A) 10/13/2017 1052   NITRITE NEGATIVE 10/13/2017 1052   LEUKOCYTESUR LARGE (A) 10/13/2017 1052   Sepsis Labs: @LABRCNTIP (procalcitonin:4,lacticidven:4)  )No results found for this or any previous visit (from the past 240 hour(s)).       Radiology Studies: No results found.      Scheduled Meds: . allopurinol  100 mg Oral Daily  . aspirin EC  325 mg Oral Daily  . brinzolamide  1 drop Both Eyes QHS  . budesonide (PULMICORT) nebulizer solution  0.25 mg Nebulization BID  . finasteride  5 mg Oral Daily  . furosemide  40 mg Intravenous BID  . heparin  5,000 Units Subcutaneous Q8H  . insulin aspart  0-5 Units Subcutaneous QHS  . insulin aspart  0-9 Units Subcutaneous TID WC  . ipratropium-albuterol  3 mL Nebulization TID  . mouth rinse  15 mL Mouth Rinse BID  .  mirabegron ER  50 mg Oral Daily  . potassium chloride  40 mEq Oral BID  . sodium chloride flush  3 mL Intravenous Q12H  . tadalafil (PAH)  20 mg Oral Daily  . Travoprost (BAK Free)  1 drop Both Eyes QHS   Continuous Infusions: . sodium chloride       LOS: 8 days    Time spent:    Berton Mount, MD [facility] Page via [email or web address], password [email or web address] After 7PM please contact night-coverage  11/18/2017, 5:33 PM

## 2017-11-19 LAB — RENAL FUNCTION PANEL
Albumin: 3.1 g/dL — ABNORMAL LOW (ref 3.5–5.0)
Anion gap: 10 (ref 5–15)
BUN: 48 mg/dL — ABNORMAL HIGH (ref 6–20)
CO2: 27 mmol/L (ref 22–32)
Calcium: 8.7 mg/dL — ABNORMAL LOW (ref 8.9–10.3)
Chloride: 106 mmol/L (ref 101–111)
Creatinine, Ser: 1.56 mg/dL — ABNORMAL HIGH (ref 0.61–1.24)
GFR calc Af Amer: 43 mL/min — ABNORMAL LOW (ref >60)
GFR calc non Af Amer: 37 mL/min — ABNORMAL LOW (ref >60)
Glucose, Bld: 118 mg/dL — ABNORMAL HIGH (ref 65–99)
Phosphorus: 2.9 mg/dL (ref 2.5–4.6)
Potassium: 4.5 mmol/L (ref 3.5–5.1)
Sodium: 143 mmol/L (ref 135–145)

## 2017-11-19 LAB — GLUCOSE, CAPILLARY
GLUCOSE-CAPILLARY: 136 mg/dL — AB (ref 65–99)
GLUCOSE-CAPILLARY: 147 mg/dL — AB (ref 65–99)
GLUCOSE-CAPILLARY: 159 mg/dL — AB (ref 65–99)
Glucose-Capillary: 117 mg/dL — ABNORMAL HIGH (ref 65–99)

## 2017-11-19 MED ORDER — IPRATROPIUM-ALBUTEROL 0.5-2.5 (3) MG/3ML IN SOLN: 3.0000 mL | RESPIRATORY_TRACT | 0 refills | Status: AC | PRN

## 2017-11-19 MED ORDER — IPRATROPIUM-ALBUTEROL 0.5-2.5 (3) MG/3ML IN SOLN: 3.0000 mL | Freq: Three times a day (TID) | RESPIRATORY_TRACT | 0 refills | Status: AC

## 2017-11-19 MED ORDER — BUDESONIDE 0.25 MG/2ML IN SUSP: 0.2500 mg | Freq: Two times a day (BID) | RESPIRATORY_TRACT | 12 refills | Status: AC

## 2017-11-19 MED ORDER — TORSEMIDE 20 MG PO TABS: 20.0000 mg | ORAL_TABLET | Freq: Every day | ORAL | 0 refills | Status: AC

## 2017-11-19 MED ORDER — MORPHINE SULFATE (CONCENTRATE) 10 MG/0.5ML PO SOLN: 2.5000 mg | ORAL | 0 refills | Status: AC | PRN

## 2017-11-19 MED ORDER — TORSEMIDE 20 MG PO TABS
20.0000 mg | ORAL_TABLET | Freq: Every day | ORAL | Status: DC
  Administered 2017-11-19 – 2017-11-20 (×2): 20 mg via ORAL
  Filled 2017-11-19 (×2): qty 1

## 2017-11-19 NOTE — Progress Notes (Signed)
Patient is not able to leave right now since we are not able to get a hold of his daughter and he lives with her. PTAR will need her to be home and a way to get into the home. So patient's D/C is on hold for now.

## 2017-11-19 NOTE — Progress Notes (Signed)
Patient's D/C is still on hold for right now till we get a hold of the daughter.

## 2017-11-19 NOTE — Progress Notes (Signed)
Talked to case manager and her and I still have not gotten a hold of the patient's daughter. The case manager is going to call the police to check and make sure she is ok. Since she used to be very interactive with the patient's care.

## 2017-11-19 NOTE — Progress Notes (Signed)
CSW spoke with CM Morrie SheldonAshley concerning pt.  Pt will be going home with hospice in place.  Budd Palmerara Muhsin Doris LCSWA [phone]

## 2017-11-19 NOTE — Progress Notes (Signed)
I talked to case management and patient will be D/C with hospice today and she is unable to get a hold of the daughter and we need to figure out transportation for the patient, whether he will be picked up by [facility] or if the daughter will be here to get him and brings a portable tank. I told her if the daughter calls I will ask her and then let case management know.

## 2017-11-19 NOTE — Progress Notes (Signed)
MD said to put D/C on hold.

## 2017-11-19 NOTE — Progress Notes (Addendum)
[facility] Hospital Liaison:  RN visit  Notified by Morrie SheldonAshley, [facility]RN, of patient/family request for [facility] at [facility] after discharge.  Chart and patient information reviewed by [facility] physician.  Hospice eligibility approved.  Writer spoke with [name], daughter, over the phone to initiate education related to hospice philosophy, services and team approach to care.  Family verbalized understanding of information given.  Per discussion, plan is for discharge to home on 11/20/17 by PTAR.    Please send signed and completed DNR form home with patient/family.  Patient will need prescriptions for discharge comfort medications.   DME needs have been discussed, patient currently has the following equipment in the home:  O2 concentrator, walker, raised toilet seat, safe tub, wheelchair.  Family declines need for additional equipment at this time.  UPDATE:  [facility]  Address is correct in chart.  Contact person is Gaston IslamCynthia Huffines at [phone].  [facility] Referral Center aware of the above.  Completed discharge summary will need to be faxed to [facility] at [phone], when final.  Please notify [facility] when patient is ready to leave the unit at discharge.  Call [phone] or [phone] after 5pm and on weekends.  [facility] information and contact numbers given to patient (left in room) during this visit.  Above information shared with Morrie Sheldonshley, [facility]RN.  Please call with any hospice related questions.  Thank you for this referral.  Adele BarthelAmy Evans, RN, BSN [facility] Hospital Liaison [phone]  All hospital liaisons are now on AMION.

## 2017-11-19 NOTE — Progress Notes (Signed)
MD notified of patient not being able to be D/C due to no one being at home to care for him and grand daughter can not get there till tomorrow. His daughter has the flu and is a patient in the hospital.

## 2017-11-19 NOTE — Progress Notes (Signed)
I talked to the care management Morrie Sheldonshley and she said she will call the grand daughter and I gave her an update.

## 2017-11-19 NOTE — Care Management (Addendum)
1340- Pt has d/c order today.  Amy, liaison for  [facility], had spoken with daughter, [name] and arranged for [facility] for Monday.  Amy and I have been unable to reach daughter, [name] since around 11:00am yesterday.  No answer at either number on chart and no family has been in room per bedside RN.  MD aware that pt cannot be d/c until family is contacted.  Pt will go home via PTAR as he is frail and requires O2.  Amy aware that pt needs nebulizer per MD.    [address]- Granddaughter, Amalia GreenhouseBetsy Dailey, [phone]) contacted.  Tamela OddiBetsy states they are planning for [facility] to stay with pt 24/7 upon his discharge home but they are not able to get that set up for today.  Pt's daughter is in the hospital with the flu and will not be available.  Tamela OddiBetsy will be available by phone but will not be in town until tomorrow.  I asked Tamela OddiBetsy to have [facility] staff at home around 11am tomorrow to receive pt.  Pt will need [facility] transport.  Amy with [facility] updated and will speak to [facility].  Tamela OddiBetsy advised that pt has discharge order in and has no medical reason to stay overnight, therefore Medicare may not pay for tonight's stay.  Betsy verbalizes understanding.  Tamela OddiBetsy will call the unit RN tomorrow morning with plan to verify that staff will be at the house to care for the pt upon his arrival.

## 2017-11-19 NOTE — Progress Notes (Signed)
Patient's grand daughter called and stated the patient's daughter is in the hospital with complications of the flu. The grand daughter is from out of town and can not get there till tomorrow. She stated no one will be at his home today to let him in  or be able to take care of him or stay with him. Grand daughter is also unaware if he has oxygen at the home. It is not safe for the patient to go home since we are unaware if someone can care for him. She requested D/C to be tomorrow.

## 2017-11-19 NOTE — Progress Notes (Signed)
Patient's grand daughters number [phone] Cheron EveryBetesy Daley.

## 2017-11-19 NOTE — Progress Notes (Signed)
[facility]  RN  Spoke with granddaughter, Tamela OddiBetsy Daily at [phone], who advised her mother (patient's daughter) is in the hospital herself being treated.  Daughter is unable to receive patient home on discharge.  Tamela OddiBetsy advised that she will ensure that someone is there to receive patient and to meet with [facility] AV RN when appointment is set up.  [facility] liaison will follow [facility] with [facility] tomorrow per her request.  I have advised Morrie Sheldonshley, [facility], of same.   Thank you for this referral,  Adele BarthelAmy Evans, RN, BSN [facility] Hospital Liaison [phone]  All hospital liaisons are now on AMION.

## 2017-11-19 NOTE — Discharge Summary (Signed)
Physician Discharge Summary  Patient ID: [name] [name] MRN: [id number] DOB/AGE: 06-24-1925 84 y.o.  Admit date: 11/10/2017 Discharge date: 11/19/2017  Admission Diagnoses:  Discharge Diagnoses:  Principal Problem:   Acute on chronic diastolic CHF exacerbation   Wheezing   PAF   AKI on CKD 3   Dementia Active Problems:   HTN (hypertension)   DM (diabetes mellitus), type 2 with renal complications (HCC)   Chronic kidney disease, stage III (moderate) (HCC)   Atrial fibrillation (HCC)   Acute on chronic diastolic CHF (congestive heart failure) (HCC)   Advance care planning   Goals of care, counseling/discussion   Palliative care by specialist   Dyspnea   Discharged Condition: stable  Hospital Course: Patient is a 84 year old Caucasian male with past medical history significant forparoxysmal atrial fibrillation (no longer on anticoagulation), Dementia, chronic diastolic CHF, chronic kidney disease stage III, type 2 diabetes mellitus, dementia, and hypertension. Patient was admitted with worsening confusion, anasarca, including penile and scrotal edema, shortness of breath and cough. Work up done revealed pulmonary edema (acute on chronic diastolic CHF). Patient was aggressively diuresed. Patient has improved significantly. During the hospital stay, palliative team was consulted, and the plan is to discharge patient home today with palliative/hospice care.   Consults: Palliative care   Discharge Exam: Blood pressure 109/61, pulse (!) 56, temperature 98.1 F (36.7 C), temperature source Oral, resp. rate 18, height 5\' 8"  (1.727 m), weight 88.5 kg (195 lb), SpO2 96 %.   Disposition: 03-Skilled Nursing Facility  Discharge Instructions    Call MD for:   Complete by:  As directed    Call Hospice with questions   Diet - low sodium heart healthy   Complete by:  As directed    Increase activity slowly   Complete by:  As directed      Allergies as of 11/19/2017   No Known  Allergies     Medication List    STOP taking these medications   furosemide 40 MG tablet Commonly known as:  LASIX     TAKE these medications   allopurinol 100 MG tablet Commonly known as:  ZYLOPRIM Take 100 mg by mouth daily.   aspirin EC 325 MG tablet Take 325 mg by mouth daily.   brinzolamide 1 % ophthalmic suspension Commonly known as:  AZOPT Place 1 drop into both eyes at bedtime.   budesonide 0.25 MG/2ML nebulizer solution Commonly known as:  PULMICORT Take 2 mLs (0.25 mg total) by nebulization 2 (two) times daily.   cefpodoxime 200 MG tablet Commonly known as:  VANTIN Take 1 tablet (200 mg total) by mouth daily.   finasteride 5 MG tablet Commonly known as:  PROSCAR Take 5 mg by mouth daily.   ipratropium-albuterol 0.5-2.5 (3) MG/3ML Soln Commonly known as:  DUONEB Take 3 mLs by nebulization every 4 (four) hours as needed.   ipratropium-albuterol 0.5-2.5 (3) MG/3ML Soln Commonly known as:  DUONEB Take 3 mLs by nebulization 3 (three) times daily.   metolazone 2.5 MG tablet Commonly known as:  ZAROXOLYN Take 2.5 mg by mouth every other day. Filled 09-20-17   morphine CONCENTRATE 10 MG/0.5ML Soln concentrated solution Place 0.13 mLs (2.6 mg total) under the tongue every 2 (two) hours as needed for severe pain or shortness of breath.   MYRBETRIQ 50 MG Tb24 tablet Generic drug:  mirabegron ER Take 50 mg by mouth daily.   polyethylene glycol packet Commonly known as:  MIRALAX / GLYCOLAX Take 17 g by mouth daily  as needed for mild constipation.   potassium chloride SA 20 MEQ tablet Commonly known as:  K-DUR,KLOR-CON Take 40 mEq by mouth 2 (two) times daily.   sitaGLIPtin 25 MG tablet Commonly known as:  JANUVIA Take 1 tablet (25 mg total) by mouth daily.   tadalafil (PAH) 20 MG tablet Commonly known as:  ADCIRCA Take 1 tablet (20 mg total) by mouth daily.   torsemide 20 MG tablet Commonly known as:  DEMADEX Take 1 tablet (20 mg total) by mouth  daily. Start taking on:  11/20/2017   travoprost (benzalkonium) 0.004 % ophthalmic solution Commonly known as:  TRAVATAN Place 1 drop into both eyes at bedtime.        SignedBarnetta Chapel: Sylvester I Ogbata 11/19/2017, 12:31 PM

## 2017-11-19 NOTE — Progress Notes (Signed)
Pt requests fluids hourly, he becomes agitated when rationale for  fluid restrictions are explained. Provided ice chips and oral care, however pt continues to express displeasure with restrictions.

## 2017-11-20 LAB — GLUCOSE, CAPILLARY
GLUCOSE-CAPILLARY: 119 mg/dL — AB (ref 65–99)
Glucose-Capillary: 200 mg/dL — ABNORMAL HIGH (ref 65–99)

## 2017-11-20 LAB — RENAL FUNCTION PANEL
Albumin: 3.1 g/dL — ABNORMAL LOW (ref 3.5–5.0)
Anion gap: 10 (ref 5–15)
BUN: 48 mg/dL — ABNORMAL HIGH (ref 6–20)
CO2: 25 mmol/L (ref 22–32)
Calcium: 8.7 mg/dL — ABNORMAL LOW (ref 8.9–10.3)
Chloride: 107 mmol/L (ref 101–111)
Creatinine, Ser: 1.6 mg/dL — ABNORMAL HIGH (ref 0.61–1.24)
GFR calc Af Amer: 42 mL/min — ABNORMAL LOW (ref >60)
GFR calc non Af Amer: 36 mL/min — ABNORMAL LOW (ref >60)
Glucose, Bld: 163 mg/dL — ABNORMAL HIGH (ref 65–99)
Phosphorus: 2.7 mg/dL (ref 2.5–4.6)
Potassium: 4.5 mmol/L (ref 3.5–5.1)
Sodium: 142 mmol/L (ref 135–145)

## 2017-11-20 NOTE — Progress Notes (Signed)
Patient was not discharged back home yesterday (11/19/17) with Palliative/Hospice as patient's daughter was admitted to the Hospital with Flu.  PLAN: Please use discharge Summary dated 11/19/17 as a progress note.            Likely Discharge Home today with Palliative/Hospice, if feasible (Will add addendum to the discharge summary if discharge plans are confirmed)

## 2017-11-20 NOTE — Progress Notes (Signed)
[facility] Hospital Liaison: RN visit  Follow-up visit for discharge for home with hospice.  No family present at time of visit.  Placed call to granddaughter, Tamela OddiBetsy, who plans to be available today for patient discharge. Per discussion, plan is for discharge to home today via [facility].    Please send signed and completed DNR form home with patient/family. Patient will need prescriptions for discharge comfort medications.   Family currently awaiting nebulizer [facility]  [facility]erral Center aware of the above. Completed discharge summary will need to be faxed to [facility] at [phone], when final. Please notify [facility] when patient is ready to leave the unit at discharge. Call [phone] or [phone] after 5pm and on weekends.   Please call with any hospice related questions.  Thank you,  Hessie KnowsStacie Wilkinson, RN, BSN [facility] Hospital Liaison [phone]  All hospital liaisons are now on AMION.

## 2017-11-20 NOTE — Discharge Summary (Signed)
Physician Discharge Summary  Patient ID: [name] [name] E Haacke MRN: [id number] DOB/AGE: 04/18/1925 84 y.o.  Admit date: 11/10/2017 Discharge date: 11/20/2017  Admission Diagnoses:  Discharge Diagnoses:  Principal Problem:   Acute on chronic diastolic CHF exacerbation   Wheezing   PAF   AKI on CKD 3   Dementia Active Problems:   HTN (hypertension)   DM (diabetes mellitus), type 2 with renal complications (HCC)   Chronic kidney disease, stage III (moderate) (HCC)   Atrial fibrillation (HCC)   Acute on chronic diastolic CHF (congestive heart failure) (HCC)   Advance care planning   Goals of care, counseling/discussion   Palliative care by specialist   Dyspnea   Discharged Condition: stable  Hospital Course: Patient is a 84 year old Caucasian male with past medical history significant forparoxysmal atrial fibrillation (no longer on anticoagulation), Dementia, chronic diastolic CHF, chronic kidney disease stage III, type 2 diabetes mellitus, dementia, and hypertension. Patient was admitted with worsening confusion, anasarca, including penile and scrotal edema, shortness of breath and cough. Work up done revealed pulmonary edema (acute on chronic diastolic CHF). Patient was aggressively diuresed. Patient has improved significantly. During the hospital stay, palliative team was consulted, and the plan is to discharge patient home today with palliative/hospice care.   Consults: Palliative care   Discharge Exam: Blood pressure 120/68, pulse 65, temperature 97.9 F (36.6 C), temperature source Oral, resp. rate 18, height 5\' 8"  (1.727 m), weight 97.1 kg (214 lb), SpO2 97 %.   Disposition: 01-Home or Self Care  Discharge Instructions    Call MD for:   Complete by:  As directed    Call Hospice with questions   Diet - low sodium heart healthy   Complete by:  As directed    Increase activity slowly   Complete by:  As directed      Allergies as of 11/20/2017   No Known Allergies      Medication List    STOP taking these medications   furosemide 40 MG tablet Commonly known as:  LASIX     TAKE these medications   allopurinol 100 MG tablet Commonly known as:  ZYLOPRIM Take 100 mg by mouth daily.   aspirin EC 325 MG tablet Take 325 mg by mouth daily.   brinzolamide 1 % ophthalmic suspension Commonly known as:  AZOPT Place 1 drop into both eyes at bedtime.   budesonide 0.25 MG/2ML nebulizer solution Commonly known as:  PULMICORT Take 2 mLs (0.25 mg total) by nebulization 2 (two) times daily.   cefpodoxime 200 MG tablet Commonly known as:  VANTIN Take 1 tablet (200 mg total) by mouth daily.   finasteride 5 MG tablet Commonly known as:  PROSCAR Take 5 mg by mouth daily.   ipratropium-albuterol 0.5-2.5 (3) MG/3ML Soln Commonly known as:  DUONEB Take 3 mLs by nebulization every 4 (four) hours as needed.   ipratropium-albuterol 0.5-2.5 (3) MG/3ML Soln Commonly known as:  DUONEB Take 3 mLs by nebulization 3 (three) times daily.   metolazone 2.5 MG tablet Commonly known as:  ZAROXOLYN Take 2.5 mg by mouth every other day. Filled 09-20-17   morphine CONCENTRATE 10 MG/0.5ML Soln concentrated solution Place 0.13 mLs (2.6 mg total) under the tongue every 2 (two) hours as needed for severe pain or shortness of breath.   MYRBETRIQ 50 MG Tb24 tablet Generic drug:  mirabegron ER Take 50 mg by mouth daily.   polyethylene glycol packet Commonly known as:  MIRALAX / GLYCOLAX Take 17 g by mouth daily  as needed for mild constipation.   potassium chloride SA 20 MEQ tablet Commonly known as:  K-DUR,KLOR-CON Take 40 mEq by mouth 2 (two) times daily.   sitaGLIPtin 25 MG tablet Commonly known as:  JANUVIA Take 1 tablet (25 mg total) by mouth daily.   tadalafil (PAH) 20 MG tablet Commonly known as:  ADCIRCA Take 1 tablet (20 mg total) by mouth daily.   torsemide 20 MG tablet Commonly known as:  DEMADEX Take 1 tablet (20 mg total) by mouth daily.    travoprost (benzalkonium) 0.004 % ophthalmic solution Commonly known as:  TRAVATAN Place 1 drop into both eyes at bedtime.      ADDENDUM: Patient will be discharged back home today, 11/20/2017. No changes overnight.  SignedBarnetta Chapel 11/20/2017, 2:41 PM

## 2017-11-20 NOTE — Progress Notes (Signed)
[name] E Pedregon to be D/C'd Home per MD order.  Discussed with the patient and all questions fully answered.  VSS, Skin clean, dry and intact without evidence of skin break down, no evidence of skin tears noted. IV catheter discontinued intact. Site without signs and symptoms of complications. Dressing and pressure applied.  An After Visit Summary was printed and given to the patient. Patient received prescription.  D/c education completed with ptar including follow up instructions, medication list, d/c activities limitations if indicated, with other d/c instructions as indicated by MD - patient able to verbalize understanding, all questions fully answered.     Patient escorted via WC, and D/C home via ambulance.  [name] [name] O Angelette Ganus 11/20/2017 7:40 PM

## 2017-11-21 DIAGNOSIS — F0391 Unspecified dementia with behavioral disturbance: Secondary | ICD-10-CM | POA: Diagnosis not present

## 2017-11-21 DIAGNOSIS — H409 Unspecified glaucoma: Secondary | ICD-10-CM | POA: Diagnosis not present

## 2017-11-21 DIAGNOSIS — J449 Chronic obstructive pulmonary disease, unspecified: Secondary | ICD-10-CM | POA: Diagnosis not present

## 2017-11-21 DIAGNOSIS — J9620 Acute and chronic respiratory failure, unspecified whether with hypoxia or hypercapnia: Secondary | ICD-10-CM | POA: Diagnosis not present

## 2017-11-21 DIAGNOSIS — J918 Pleural effusion in other conditions classified elsewhere: Secondary | ICD-10-CM | POA: Diagnosis not present

## 2017-11-21 DIAGNOSIS — I10 Essential (primary) hypertension: Secondary | ICD-10-CM | POA: Diagnosis not present

## 2017-11-21 DIAGNOSIS — E1159 Type 2 diabetes mellitus with other circulatory complications: Secondary | ICD-10-CM | POA: Diagnosis not present

## 2017-11-21 DIAGNOSIS — M109 Gout, unspecified: Secondary | ICD-10-CM | POA: Diagnosis not present

## 2017-11-21 DIAGNOSIS — N401 Enlarged prostate with lower urinary tract symptoms: Secondary | ICD-10-CM | POA: Diagnosis not present

## 2017-11-21 DIAGNOSIS — E785 Hyperlipidemia, unspecified: Secondary | ICD-10-CM | POA: Diagnosis not present

## 2017-11-21 DIAGNOSIS — N529 Male erectile dysfunction, unspecified: Secondary | ICD-10-CM | POA: Diagnosis not present

## 2017-11-21 DIAGNOSIS — I4891 Unspecified atrial fibrillation: Secondary | ICD-10-CM | POA: Diagnosis not present

## 2017-11-21 DIAGNOSIS — N183 Chronic kidney disease, stage 3 (moderate): Secondary | ICD-10-CM | POA: Diagnosis not present

## 2017-11-21 DIAGNOSIS — I5030 Unspecified diastolic (congestive) heart failure: Secondary | ICD-10-CM | POA: Diagnosis not present

## 2017-11-21 DIAGNOSIS — N390 Urinary tract infection, site not specified: Secondary | ICD-10-CM | POA: Diagnosis not present

## 2017-11-22 ENCOUNTER — Ambulatory Visit (INDEPENDENT_AMBULATORY_CARE_PROVIDER_SITE_OTHER): Payer: Medicare Other | Admitting: Internal Medicine

## 2017-11-22 ENCOUNTER — Encounter: Payer: Self-pay | Admitting: Internal Medicine

## 2017-11-22 VITALS — BP 124/56 | HR 76 | Ht 68.0 in | Wt 205.0 lb

## 2017-11-22 DIAGNOSIS — R001 Bradycardia, unspecified: Secondary | ICD-10-CM

## 2017-11-22 DIAGNOSIS — I4821 Permanent atrial fibrillation: Secondary | ICD-10-CM

## 2017-11-22 DIAGNOSIS — J449 Chronic obstructive pulmonary disease, unspecified: Secondary | ICD-10-CM | POA: Diagnosis not present

## 2017-11-22 DIAGNOSIS — R55 Syncope and collapse: Secondary | ICD-10-CM | POA: Diagnosis not present

## 2017-11-22 DIAGNOSIS — I4891 Unspecified atrial fibrillation: Secondary | ICD-10-CM | POA: Diagnosis not present

## 2017-11-22 DIAGNOSIS — J9620 Acute and chronic respiratory failure, unspecified whether with hypoxia or hypercapnia: Secondary | ICD-10-CM | POA: Diagnosis not present

## 2017-11-22 DIAGNOSIS — I482 Chronic atrial fibrillation: Secondary | ICD-10-CM | POA: Diagnosis not present

## 2017-11-22 DIAGNOSIS — I5030 Unspecified diastolic (congestive) heart failure: Secondary | ICD-10-CM | POA: Diagnosis not present

## 2017-11-22 DIAGNOSIS — J918 Pleural effusion in other conditions classified elsewhere: Secondary | ICD-10-CM | POA: Diagnosis not present

## 2017-11-22 DIAGNOSIS — F0391 Unspecified dementia with behavioral disturbance: Secondary | ICD-10-CM | POA: Diagnosis not present

## 2017-11-22 LAB — CUP PACEART INCLINIC DEVICE CHECK
Date Time Interrogation Session: 20190123153628
Implantable Pulse Generator Implant Date: 20180828

## 2017-11-22 NOTE — Progress Notes (Signed)
PCP: Gaspar Garbe, MD Primary Cardiologist: Dr Rosemary Holms Primary EP: Dr Yates Decamp is a 84 y.o. male who presents today for routine electrophysiology followup.  He has advanced age and continues to have slow decline.  Not very active.  In a wheelchair today.  Today, he denies symptoms of palpitations, chest pain, dizziness, presyncope, or syncope.  The patient is otherwise without complaint today.   Past Medical History:  Diagnosis Date  . BPH with elevated PSA   . Chest pain   . Chronic kidney disease, stage III (moderate) (HCC)   . DM (diabetes mellitus), type 2 with renal complications (HCC)   . ED (erectile dysfunction)   . Glaucoma   . Hyperlipidemia   . Hypertension   . Microalbuminuria   . Microalbuminuria 2013  . Obesity   . Osteoarthritis   . Prostatic hypertrophy, benign, with obstruction   . Renal calculus   . Sick sinus syndrome [facility])    Past Surgical History:  Procedure Laterality Date  . INTRAMEDULLARY (IM) NAIL INTERTROCHANTERIC Left 06/21/2017   Procedure: INTRAMEDULLARY (IM) NAIL INTERTROCHANTRIC Left Leg;  Surgeon: Sheral Apley, MD;  Location: [facility] OR;  Service: Orthopedics;  Laterality: Left;  . INTRAVASCULAR PRESSURE WIRE/FFR STUDY N/A 04/18/2017   Procedure: Intravascular Pressure Wire/FFR Study;  Surgeon: Yates Decamp, MD;  Location: [facility] INVASIVE CV LAB;  Service: Cardiovascular;  Laterality: N/A;  Prox LAD  . KNEE SURGERY    . LOOP RECORDER INSERTION N/A 06/27/2017   Procedure: LOOP RECORDER INSERTION;  Surgeon: Hillis Range, MD;  Location: [facility] INVASIVE CV LAB;  Service: Cardiovascular;  Laterality: N/A;  . REPLACEMENT TOTAL KNEE  02/26/01    ROS- all systems are reviewed and negatives except as per HPI above  Current Outpatient Medications  Medication Sig Dispense Refill  . allopurinol (ZYLOPRIM) 100 MG tablet Take 100 mg by mouth daily.    Marland Kitchen aspirin EC 325 MG tablet Take 325 mg by mouth daily.    . brinzolamide (AZOPT) 1 %  ophthalmic suspension Place 1 drop into both eyes at bedtime.     . budesonide (PULMICORT) 0.25 MG/2ML nebulizer solution Take 2 mLs (0.25 mg total) by nebulization 2 (two) times daily. 60 mL 12  . cefpodoxime (VANTIN) 200 MG tablet Take 1 tablet (200 mg total) by mouth daily.    . finasteride (PROSCAR) 5 MG tablet Take 5 mg by mouth daily.    Marland Kitchen ipratropium-albuterol (DUONEB) 0.5-2.5 (3) MG/3ML SOLN Take 3 mLs by nebulization every 4 (four) hours as needed. 360 mL 0  . ipratropium-albuterol (DUONEB) 0.5-2.5 (3) MG/3ML SOLN Take 3 mLs by nebulization 3 (three) times daily. 360 mL 0  . metolazone (ZAROXOLYN) 2.5 MG tablet Take 2.5 mg by mouth every other day. Filled 09-20-17    . Morphine Sulfate (MORPHINE CONCENTRATE) 10 MG/0.5ML SOLN concentrated solution Place 0.13 mLs (2.6 mg total) under the tongue every 2 (two) hours as needed for severe pain or shortness of breath. 180 mL 0  . MYRBETRIQ 50 MG TB24 tablet Take 50 mg by mouth daily.    . polyethylene glycol (MIRALAX / GLYCOLAX) packet Take 17 g by mouth daily as needed for mild constipation.    . potassium chloride SA (K-DUR,KLOR-CON) 20 MEQ tablet Take 40 mEq by mouth 2 (two) times daily.     . sitaGLIPtin (JANUVIA) 25 MG tablet Take 1 tablet (25 mg total) by mouth daily.    . tadalafil, PAH, (ADCIRCA) 20 MG tablet Take 1 tablet (  20 mg total) by mouth daily.    Marland Kitchen. torsemide (DEMADEX) 20 MG tablet Take 1 tablet (20 mg total) by mouth daily. 30 tablet 0  . travoprost, benzalkonium, (TRAVATAN) 0.004 % ophthalmic solution Place 1 drop into both eyes at bedtime.     No current facility-administered medications for this visit.     Physical Exam: Vitals:   11/22/17 1511  BP: (!) 124/56  Pulse: 76  SpO2: 98%  Weight: 205 lb (93 kg)  Height: 5\' 8"  (1.727 m)    GEN- The patient is elderly and frail appearing, alert and oriented x 3 today.   Head- normocephalic, atraumatic Eyes-  Sclera clear, conjunctiva pink Ears- hearing  intact Oropharynx- clear Lungs- Clear to ausculation bilaterally, normal work of breathing Heart- irregular rate and rhythm,  GI- soft, NT, ND, + BS Extremities- no clubbing, cyanosis, + dependant edema In a wheelchair today  ILR interrogation personally reviewed today, no episodes are noted  Assessment and Plan:  1. Syncope Likely due to orthostatic hypotension No further episodes No worrisome arrhythmias on ILR V rates appear reasonable by histogram  2. Permanent afib Rate controlled Poor candidate for anticoagulation per Dr Rosemary HolmsPatwardhan  3. Chronic diastolic dysfunction Stable No change required today  Carelink Given advanced age, a conservative approach is advised Return to see EP PA in a year  Hillis RangeJames Latavia Goga MD, [facility] 11/22/2017 3:21 PM

## 2017-11-22 NOTE — Patient Instructions (Signed)
Medication Instructions:  Your physician recommends that you continue on your current medications as directed. Please refer to the Current Medication list given to you today.   Labwork: None ordered.  Testing/Procedures: None ordered.  Follow-Up: Your physician wants you to follow-up in: 12 months witl Francis Dowseenee Ursuy, PA. You will receive a reminder letter in the mail two months in advance. If you don't receive a letter, please call our office to schedule the follow-up appointment.  Remote monitoring is used to monitor your Loop recorder from home. This monitoring reduces the number of office visits required to check your device to one time per year. It allows us to keep an eye on the functioning of your device to ensure it is working properly. You are scheduled for a device check from home on 02/21/2018. You may send your transmission at any time that day. If you have a wireless device, the transmission will be sent automatically. After your physician reviews your transmission, you will receive a postcard with your next transmission date.     Any Other Special Instructions Will Be Listed Below (If Applicable).     If you need a refill on your cardiac medications before your next appointment, please call your pharmacy.

## 2017-11-23 DIAGNOSIS — J918 Pleural effusion in other conditions classified elsewhere: Secondary | ICD-10-CM | POA: Diagnosis not present

## 2017-11-23 DIAGNOSIS — I5030 Unspecified diastolic (congestive) heart failure: Secondary | ICD-10-CM | POA: Diagnosis not present

## 2017-11-23 DIAGNOSIS — I4891 Unspecified atrial fibrillation: Secondary | ICD-10-CM | POA: Diagnosis not present

## 2017-11-23 DIAGNOSIS — J449 Chronic obstructive pulmonary disease, unspecified: Secondary | ICD-10-CM | POA: Diagnosis not present

## 2017-11-23 DIAGNOSIS — F0391 Unspecified dementia with behavioral disturbance: Secondary | ICD-10-CM | POA: Diagnosis not present

## 2017-11-23 DIAGNOSIS — J9620 Acute and chronic respiratory failure, unspecified whether with hypoxia or hypercapnia: Secondary | ICD-10-CM | POA: Diagnosis not present

## 2017-11-24 DIAGNOSIS — R55 Syncope and collapse: Secondary | ICD-10-CM | POA: Diagnosis not present

## 2017-11-24 DIAGNOSIS — I4891 Unspecified atrial fibrillation: Secondary | ICD-10-CM | POA: Diagnosis not present

## 2017-11-24 DIAGNOSIS — Z95818 Presence of other cardiac implants and grafts: Secondary | ICD-10-CM | POA: Diagnosis not present

## 2017-11-24 DIAGNOSIS — J449 Chronic obstructive pulmonary disease, unspecified: Secondary | ICD-10-CM | POA: Diagnosis not present

## 2017-11-24 DIAGNOSIS — F0391 Unspecified dementia with behavioral disturbance: Secondary | ICD-10-CM | POA: Diagnosis not present

## 2017-11-24 DIAGNOSIS — Z4509 Encounter for adjustment and management of other cardiac device: Secondary | ICD-10-CM | POA: Diagnosis not present

## 2017-11-24 DIAGNOSIS — J918 Pleural effusion in other conditions classified elsewhere: Secondary | ICD-10-CM | POA: Diagnosis not present

## 2017-11-24 DIAGNOSIS — I5030 Unspecified diastolic (congestive) heart failure: Secondary | ICD-10-CM | POA: Diagnosis not present

## 2017-11-24 DIAGNOSIS — J9620 Acute and chronic respiratory failure, unspecified whether with hypoxia or hypercapnia: Secondary | ICD-10-CM | POA: Diagnosis not present

## 2017-11-26 DIAGNOSIS — I4891 Unspecified atrial fibrillation: Secondary | ICD-10-CM | POA: Diagnosis not present

## 2017-11-26 DIAGNOSIS — J918 Pleural effusion in other conditions classified elsewhere: Secondary | ICD-10-CM | POA: Diagnosis not present

## 2017-11-26 DIAGNOSIS — J449 Chronic obstructive pulmonary disease, unspecified: Secondary | ICD-10-CM | POA: Diagnosis not present

## 2017-11-26 DIAGNOSIS — I5030 Unspecified diastolic (congestive) heart failure: Secondary | ICD-10-CM | POA: Diagnosis not present

## 2017-11-26 DIAGNOSIS — J9620 Acute and chronic respiratory failure, unspecified whether with hypoxia or hypercapnia: Secondary | ICD-10-CM | POA: Diagnosis not present

## 2017-11-26 DIAGNOSIS — F0391 Unspecified dementia with behavioral disturbance: Secondary | ICD-10-CM | POA: Diagnosis not present

## 2017-11-29 DIAGNOSIS — I4891 Unspecified atrial fibrillation: Secondary | ICD-10-CM | POA: Diagnosis not present

## 2017-11-29 DIAGNOSIS — J918 Pleural effusion in other conditions classified elsewhere: Secondary | ICD-10-CM | POA: Diagnosis not present

## 2017-11-29 DIAGNOSIS — J9620 Acute and chronic respiratory failure, unspecified whether with hypoxia or hypercapnia: Secondary | ICD-10-CM | POA: Diagnosis not present

## 2017-11-29 DIAGNOSIS — J449 Chronic obstructive pulmonary disease, unspecified: Secondary | ICD-10-CM | POA: Diagnosis not present

## 2017-11-29 DIAGNOSIS — F0391 Unspecified dementia with behavioral disturbance: Secondary | ICD-10-CM | POA: Diagnosis not present

## 2017-11-29 DIAGNOSIS — I5030 Unspecified diastolic (congestive) heart failure: Secondary | ICD-10-CM | POA: Diagnosis not present

## 2017-12-01 DIAGNOSIS — N529 Male erectile dysfunction, unspecified: Secondary | ICD-10-CM | POA: Diagnosis not present

## 2017-12-01 DIAGNOSIS — F0391 Unspecified dementia with behavioral disturbance: Secondary | ICD-10-CM | POA: Diagnosis not present

## 2017-12-01 DIAGNOSIS — H409 Unspecified glaucoma: Secondary | ICD-10-CM | POA: Diagnosis not present

## 2017-12-01 DIAGNOSIS — M109 Gout, unspecified: Secondary | ICD-10-CM | POA: Diagnosis not present

## 2017-12-01 DIAGNOSIS — I10 Essential (primary) hypertension: Secondary | ICD-10-CM | POA: Diagnosis not present

## 2017-12-01 DIAGNOSIS — I4891 Unspecified atrial fibrillation: Secondary | ICD-10-CM | POA: Diagnosis not present

## 2017-12-01 DIAGNOSIS — E1159 Type 2 diabetes mellitus with other circulatory complications: Secondary | ICD-10-CM | POA: Diagnosis not present

## 2017-12-01 DIAGNOSIS — N390 Urinary tract infection, site not specified: Secondary | ICD-10-CM | POA: Diagnosis not present

## 2017-12-01 DIAGNOSIS — N183 Chronic kidney disease, stage 3 (moderate): Secondary | ICD-10-CM | POA: Diagnosis not present

## 2017-12-01 DIAGNOSIS — I5030 Unspecified diastolic (congestive) heart failure: Secondary | ICD-10-CM | POA: Diagnosis not present

## 2017-12-01 DIAGNOSIS — J9620 Acute and chronic respiratory failure, unspecified whether with hypoxia or hypercapnia: Secondary | ICD-10-CM | POA: Diagnosis not present

## 2017-12-01 DIAGNOSIS — J449 Chronic obstructive pulmonary disease, unspecified: Secondary | ICD-10-CM | POA: Diagnosis not present

## 2017-12-01 DIAGNOSIS — N401 Enlarged prostate with lower urinary tract symptoms: Secondary | ICD-10-CM | POA: Diagnosis not present

## 2017-12-01 DIAGNOSIS — E785 Hyperlipidemia, unspecified: Secondary | ICD-10-CM | POA: Diagnosis not present

## 2017-12-01 DIAGNOSIS — J918 Pleural effusion in other conditions classified elsewhere: Secondary | ICD-10-CM | POA: Diagnosis not present

## 2017-12-03 ENCOUNTER — Emergency Department (HOSPITAL_COMMUNITY)
Admission: EM | Admit: 2017-12-03 | Discharge: 2017-12-04 | Disposition: A | Discharge status: Home / Self Care | Attending: Emergency Medicine | Admitting: Emergency Medicine

## 2017-12-03 ENCOUNTER — Encounter (HOSPITAL_COMMUNITY): Payer: Self-pay | Admitting: *Deleted

## 2017-12-03 ENCOUNTER — Other Ambulatory Visit: Payer: Self-pay

## 2017-12-03 ENCOUNTER — Emergency Department (HOSPITAL_COMMUNITY)

## 2017-12-03 DIAGNOSIS — Z79899 Other long term (current) drug therapy: Secondary | ICD-10-CM | POA: Insufficient documentation

## 2017-12-03 DIAGNOSIS — J449 Chronic obstructive pulmonary disease, unspecified: Secondary | ICD-10-CM | POA: Diagnosis not present

## 2017-12-03 DIAGNOSIS — R296 Repeated falls: Secondary | ICD-10-CM | POA: Diagnosis not present

## 2017-12-03 DIAGNOSIS — J9620 Acute and chronic respiratory failure, unspecified whether with hypoxia or hypercapnia: Secondary | ICD-10-CM | POA: Diagnosis not present

## 2017-12-03 DIAGNOSIS — M79661 Pain in right lower leg: Secondary | ICD-10-CM | POA: Diagnosis not present

## 2017-12-03 DIAGNOSIS — M79605 Pain in left leg: Secondary | ICD-10-CM | POA: Diagnosis present

## 2017-12-03 DIAGNOSIS — I5033 Acute on chronic diastolic (congestive) heart failure: Secondary | ICD-10-CM | POA: Insufficient documentation

## 2017-12-03 DIAGNOSIS — Z87891 Personal history of nicotine dependence: Secondary | ICD-10-CM | POA: Diagnosis not present

## 2017-12-03 DIAGNOSIS — T148XXA Other injury of unspecified body region, initial encounter: Secondary | ICD-10-CM | POA: Diagnosis not present

## 2017-12-03 DIAGNOSIS — N183 Chronic kidney disease, stage 3 (moderate): Secondary | ICD-10-CM | POA: Diagnosis not present

## 2017-12-03 DIAGNOSIS — I5030 Unspecified diastolic (congestive) heart failure: Secondary | ICD-10-CM | POA: Diagnosis not present

## 2017-12-03 DIAGNOSIS — N401 Enlarged prostate with lower urinary tract symptoms: Secondary | ICD-10-CM | POA: Insufficient documentation

## 2017-12-03 DIAGNOSIS — R52 Pain, unspecified: Secondary | ICD-10-CM

## 2017-12-03 DIAGNOSIS — M25569 Pain in unspecified knee: Secondary | ICD-10-CM | POA: Diagnosis not present

## 2017-12-03 DIAGNOSIS — Z7982 Long term (current) use of aspirin: Secondary | ICD-10-CM | POA: Insufficient documentation

## 2017-12-03 DIAGNOSIS — I4891 Unspecified atrial fibrillation: Secondary | ICD-10-CM | POA: Diagnosis not present

## 2017-12-03 DIAGNOSIS — I130 Hypertensive heart and chronic kidney disease with heart failure and stage 1 through stage 4 chronic kidney disease, or unspecified chronic kidney disease: Secondary | ICD-10-CM | POA: Diagnosis not present

## 2017-12-03 DIAGNOSIS — E1122 Type 2 diabetes mellitus with diabetic chronic kidney disease: Secondary | ICD-10-CM | POA: Insufficient documentation

## 2017-12-03 DIAGNOSIS — F0391 Unspecified dementia with behavioral disturbance: Secondary | ICD-10-CM | POA: Diagnosis not present

## 2017-12-03 DIAGNOSIS — M79606 Pain in leg, unspecified: Secondary | ICD-10-CM

## 2017-12-03 DIAGNOSIS — Z96659 Presence of unspecified artificial knee joint: Secondary | ICD-10-CM | POA: Diagnosis not present

## 2017-12-03 DIAGNOSIS — I10 Essential (primary) hypertension: Secondary | ICD-10-CM | POA: Diagnosis not present

## 2017-12-03 DIAGNOSIS — N5089 Other specified disorders of the male genital organs: Secondary | ICD-10-CM | POA: Diagnosis not present

## 2017-12-03 DIAGNOSIS — M7989 Other specified soft tissue disorders: Secondary | ICD-10-CM | POA: Diagnosis not present

## 2017-12-03 DIAGNOSIS — M25462 Effusion, left knee: Secondary | ICD-10-CM | POA: Diagnosis not present

## 2017-12-03 DIAGNOSIS — J918 Pleural effusion in other conditions classified elsewhere: Secondary | ICD-10-CM | POA: Diagnosis not present

## 2017-12-03 MED ORDER — OXYCODONE-ACETAMINOPHEN 5-325 MG PO TABS
1.0000 | ORAL_TABLET | Freq: Once | ORAL | Status: AC
  Administered 2017-12-03: 1 via ORAL
  Filled 2017-12-03: qty 1

## 2017-12-03 MED ORDER — HYDROCODONE-ACETAMINOPHEN 5-325 MG PO TABS: 1.0000 | ORAL_TABLET | Freq: Four times a day (QID) | ORAL | 0 refills | Status: DC | PRN

## 2017-12-03 MED ORDER — HYDROCODONE-ACETAMINOPHEN 5-325 MG PO TABS
1.0000 | ORAL_TABLET | Freq: Four times a day (QID) | ORAL | 0 refills | Status: AC | PRN
Start: 2017-12-03 — End: ?

## 2017-12-03 NOTE — ED Triage Notes (Signed)
The pt arrived from home by gems  He fell 3 days ago and again today  He lives at home with his daughter   She hass the flu and could not come in.  The pt is alert and oriented skin warm and dry  He has an abrasion to the  Rt forehead  He did that 3 days ago  He has a foley catheter and he is c/o pain in his groin since he fell.  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    There is some bloody urine coming from the foley drainage bag

## 2017-12-03 NOTE — ED Notes (Signed)
The pts cough is intermittent and he groans with the cough sleeping at brief intervals

## 2017-12-03 NOTE — ED Notes (Signed)
Ems reports some klnee pain however the pt does not mention knee pain  Just the groin pain

## 2017-12-03 NOTE — Discharge Instructions (Addendum)
Mr. [name] [name],  You have a small amount of fluid within your knee (an effusion) and a screw of the fixation rod placed during your hip replacement that has fractured. Your surgeon Dr. Eulah PontMurphy does not think this will cause need for surgical intervention. Apply ice to your knee to help with pain. Take percocet every 8 hours as needed. Please call the Orthopedic office at [phone] to been seen in the next couple of days.   For pain of penis and scrotum, elevate your scrotum with a rolled up towel while you are resting to help with swelling. If you are having problems with the foley catheter, please contact your urologist.

## 2017-12-03 NOTE — ED Notes (Signed)
The pt was pulled into the trauma  Room to facilitate the resident performing an exam.  The pts penis is swollen and so is his scrotum   The catheter is very flimsy  And twists very easily.  He is c/o bladder spasms and has pain in his lt middle abd with pressure.  Both legs have pitting edema the lt is a little larger than the rt  Old scar across the lt knee where he has pain

## 2017-12-03 NOTE — ED Provider Notes (Signed)
[facility] EMERGENCY DEPARTMENT Provider Note   CSN: [id number] Arrival date & time: 12/03/17  1749   History   Chief Complaint Chief Complaint  Patient presents with  . Fall    HPI [name] [name] is a 84 y.o. male who presents with concern of worsening knee pain and pain at catheter. He has history of PAF (not on anticoagulation), chronic diastolic CHF on supplemental O2, chronic kidney disease stage III, type 2 diabetes mellitus, hypertension, and foley catheter d/t BPH and ongoing penile edema with need for diuresis. He is accompanied by CNA who says he lives by himself but has 24-hour care.   HPI  Patient's knee began to hurt after he hit it about 2 weeks ago. However, pain has been worse the last few days, making it more difficult to walk. Uses walker at baseline. Pain is sharp and comes and goes. Indicates most of pain is just above left knee, at medial aspect. He says he fell forward while looking at a gas tank 2 days ago and hit his head but did not hit his legs or lose consciousness. He fell again today when a chair moved while trying to get seated but says he just fell onto his bottom. No dizziness or preceding symptoms. History of left hip fracture last year that was repaired and total knee replacement in 2002. Denies hip pain. Not having worsened shortness of breath. No chest pain.   Noticed blood in his urine today. He has had intermittent spasms of his penis/lower abdomen for months and says "nobody can figure it out." Penis is more swollen to him but testicles look similar. Catheter bag filling like normal per patient.   Past Medical History:  Diagnosis Date  . BPH with elevated PSA   . Chest pain   . Chronic kidney disease, stage III (moderate) (HCC)   . DM (diabetes mellitus), type 2 with renal complications (HCC)   . ED (erectile dysfunction)   . Glaucoma   . Hyperlipidemia   . Hypertension   . Microalbuminuria   . Microalbuminuria 2013  .  Obesity   . Osteoarthritis   . Prostatic hypertrophy, benign, with obstruction   . Renal calculus   . Sick sinus syndrome [facility])     Patient Active Problem List   Diagnosis Date Noted  . Dyspnea   . Advance care planning   . Goals of care, counseling/discussion   . Palliative care by specialist   . HCAP (healthcare-associated pneumonia) 11/10/2017  . Cellulitis and abscess of Bil leg/Rt > Lt 10/13/2017  . UTI (urinary tract infection) 10/13/2017  . Acute metabolic encephalopathy/UTI/AKI/Cellulitis 10/13/2017  . Acute-on-chronic kidney injury (HCC) 10/13/2017  . Encephalopathy 10/13/2017  . Hypokalemia 07/24/2017  . Closed intertrochanteric fracture of left femur (HCC)   . Bradycardia   . Closed left hip fracture, initial encounter (HCC) 06/21/2017  . Hypotension 06/21/2017  . Syncope 06/21/2017  . Iron deficiency anemia 06/21/2017  . Elevated troponin 06/21/2017  . Closed fracture of left hip (HCC)   . Acute on chronic diastolic CHF (congestive heart failure) (HCC) 12/01/2016  . Atrial fibrillation (HCC) 02/24/2016  . Bilateral leg weakness 09/17/2015  . Chest pain   . DM (diabetes mellitus), type 2 with renal complications (HCC)   . Hypertension   . Hyperlipidemia   . Prostatic hypertrophy, benign, with obstruction   . Sick sinus syndrome (HCC)   . Obesity   . Microalbuminuria   . Glaucoma   . Renal calculus   .  Osteoarthritis   . Chronic kidney disease, stage III (moderate) (HCC)   . BPH with elevated PSA   . ED (erectile dysfunction)   . RECTAL BLEEDING 04/22/2008  . DYSPHAGIA 04/22/2008  . HYPERLIPIDEMIA 04/18/2008  . HTN (hypertension) 04/18/2008  . ESOPHAGEAL STRICTURE 04/18/2008  . GERD 04/18/2008  . HIATAL HERNIA 04/18/2008  . DIVERTICULOSIS, COLON 04/18/2008  . COLONIC POLYPS, HYPERPLASTIC, HX OF 04/18/2008    Past Surgical History:  Procedure Laterality Date  . INTRAMEDULLARY (IM) NAIL INTERTROCHANTERIC Left 06/21/2017   Procedure: INTRAMEDULLARY  (IM) NAIL INTERTROCHANTRIC Left Leg;  Surgeon: Sheral Apley, MD;  Location: [facility] OR;  Service: Orthopedics;  Laterality: Left;  . INTRAVASCULAR PRESSURE WIRE/FFR STUDY N/A 04/18/2017   Procedure: Intravascular Pressure Wire/FFR Study;  Surgeon: Yates Decamp, MD;  Location: [facility] INVASIVE CV LAB;  Service: Cardiovascular;  Laterality: N/A;  Prox LAD  . KNEE SURGERY    . LOOP RECORDER INSERTION N/A 06/27/2017   Procedure: LOOP RECORDER INSERTION;  Surgeon: Hillis Range, MD;  Location: [facility] INVASIVE CV LAB;  Service: Cardiovascular;  Laterality: N/A;  . REPLACEMENT TOTAL KNEE  02/26/01       Home Medications    Prior to Admission medications   Medication Sig Start Date End Date Taking? Authorizing Provider  allopurinol (ZYLOPRIM) 100 MG tablet Take 100 mg by mouth daily.    [provider]  aspirin EC 325 MG tablet Take 325 mg by mouth daily.    [provider]  brinzolamide (AZOPT) 1 % ophthalmic suspension Place 1 drop into both eyes at bedtime.     [provider]  budesonide (PULMICORT) 0.25 MG/2ML nebulizer solution Take 2 mLs (0.25 mg total) by nebulization 2 (two) times daily. 11/19/17   Barnetta Chapel, MD  cefpodoxime (VANTIN) 200 MG tablet Take 1 tablet (200 mg total) by mouth daily. 10/16/17   Narda Bonds, MD  finasteride (PROSCAR) 5 MG tablet Take 5 mg by mouth daily.    [provider]  HYDROcodone-acetaminophen (NORCO) 5-325 MG tablet Take 1 tablet by mouth every 6 (six) hours as needed for moderate pain. 12/03/17   Casey Burkitt, MD  ipratropium-albuterol (DUONEB) 0.5-2.5 (3) MG/3ML SOLN Take 3 mLs by nebulization every 4 (four) hours as needed. 11/19/17   Berton Mount I, MD  ipratropium-albuterol (DUONEB) 0.5-2.5 (3) MG/3ML SOLN Take 3 mLs by nebulization 3 (three) times daily. 11/19/17   Barnetta Chapel, MD  metolazone (ZAROXOLYN) 2.5 MG tablet Take 2.5 mg by mouth every other day. Filled 09-20-17    [provider]    Morphine Sulfate (MORPHINE CONCENTRATE) 10 MG/0.5ML SOLN concentrated solution Place 0.13 mLs (2.6 mg total) under the tongue every 2 (two) hours as needed for severe pain or shortness of breath. 11/19/17   Barnetta Chapel, MD  MYRBETRIQ 50 MG TB24 tablet Take 50 mg by mouth daily. 11/27/15   [provider]  polyethylene glycol (MIRALAX / GLYCOLAX) packet Take 17 g by mouth daily as needed for mild constipation. 10/16/17   Narda Bonds, MD  potassium chloride SA (K-DUR,KLOR-CON) 20 MEQ tablet Take 40 mEq by mouth 2 (two) times daily.     [provider]  sitaGLIPtin (JANUVIA) 25 MG tablet Take 1 tablet (25 mg total) by mouth daily. 10/16/17   Narda Bonds, MD  tadalafil, PAH, (ADCIRCA) 20 MG tablet Take 1 tablet (20 mg total) by mouth daily. 10/17/17   Narda Bonds, MD  torsemide (DEMADEX) 20 MG tablet Take 1  tablet (20 mg total) by mouth daily. 11/20/17   Berton Mount I, MD  travoprost, benzalkonium, (TRAVATAN) 0.004 % ophthalmic solution Place 1 drop into both eyes at bedtime.    [provider]    Family History Family History  Problem Relation Age of Onset  . Cancer Paternal Grandfather        STOMACH  . Cancer Sister        MALE CANCER  . Cancer - Colon Sister   . Stroke Father   . Healthy Child   . Healthy Child   . Other Child        BRAIN DAMAGE AT BIRTH    Social History Social History   Tobacco Use  . Smoking status: Former Smoker    Types: Cigarettes  . Smokeless tobacco: Never Used  Substance Use Topics  . Alcohol use: No  . Drug use: No     Allergies   Patient has no known allergies.   Review of Systems Review of Systems  Constitutional: Negative for appetite change, chills and fever.  HENT: Negative for congestion and rhinorrhea.   Eyes: Negative for pain and visual disturbance.  Respiratory: Negative for cough and shortness of breath.   Cardiovascular: Positive for leg swelling (chronic). Negative for chest  pain.  Gastrointestinal: Negative for constipation, nausea and vomiting.  Genitourinary: Positive for penile pain, penile swelling and scrotal swelling. Negative for decreased urine volume and dysuria.  Neurological: Negative for dizziness, syncope, weakness and headaches.   Physical Exam Updated Vital Signs BP (!) 119/47 (BP Location: Left Arm) Comment: Repeat in [place] (125/56)  Pulse (!) 58   Temp 97.8 F (36.6 C) (Oral)   Resp 18   Ht 5\' 8"  (1.727 m)   Wt 93 kg (205 lb)   SpO2 98%   BMI 31.17 kg/m   Physical Exam  Constitutional: He is oriented to person, place, and time. He appears well-developed and well-nourished.  HENT:  Head: Normocephalic.  Eyes: EOM are normal. Pupils are equal, round, and reactive to light.  Neck: Normal range of motion. Neck supple.  Cardiovascular: Normal rate and regular rhythm.  No murmur heard. Pulmonary/Chest: Effort normal. No respiratory distress. He has no rales.  Abdominal: Soft. Bowel sounds are normal. There is tenderness (mildly TTP over lower abdomen near where there are bruises from insulin shots). There is no rebound and no guarding.  Genitourinary:  Genitourinary Comments: Swelling of penis and scrotum. Foley catheter in place. Scrotum about the size of a small melon (patient says scrotum is at baseline).   Musculoskeletal: He exhibits tenderness (medial aspect of distal left thigh).  Left knee grossly larger than right but no obvious effusion or joint line tenderness. TKR scar present.  Neurological: He is alert and oriented to person, place, and time.  Hard of hearing  Skin: Skin is warm and dry.  Erythema of bilateral lower extremities consistent with chronic venous stasis changes and symmetric 1+ edema. Healing skin tears across upper arms. Healing abrasion across R forehead.  Psychiatric: He has a normal mood and affect.  Vitals reviewed.   ED Treatments / Results  Labs (all labs ordered are listed, but only abnormal results are  displayed) Labs Reviewed  CBC WITH DIFFERENTIAL/PLATELET  COMPREHENSIVE METABOLIC PANEL  URINALYSIS, ROUTINE W REFLEX MICROSCOPIC    EKG  EKG Interpretation None       Radiology Dg Knee Complete 4 Views Left  Result Date: 12/03/2017 CLINICAL DATA:  Increased swelling and inability to bend  the knee EXAM: LEFT KNEE - COMPLETE 4+ VIEW COMPARISON:  06/21/2017 FINDINGS: No fracture or dislocation is evident. Patient is status post left knee replacement. Partially visualized intramedullary rod and fixating screw in the distal femur. There is a fracture through the mid aspect of the distal fixating screw with lucency around the head of the screw. There is been slight distal migration of the femoral rod with the tip appearing slightly more inferior compared to the fluoroscopic images from 06/21/2017. There is diffuse soft tissue swelling. There is a small to moderate joint effusion. Vascular calcifications. IMPRESSION: 1. Small to moderate knee effusion with diffuse soft tissue swelling. No fracture seen 2. Interval fracture through the distal femoral fixating screw. Distal tip of the fixating femoral rod appears more distally positioned within the femur compared to intraoperative spot views from August. Electronically Signed   By: Jasmine PangKim  Fujinaga M.D.   On: 12/03/2017 20:01    Procedures Procedures (including critical care time)  Medications Ordered in ED Medications  oxyCODONE-acetaminophen (PERCOCET/ROXICET) 5-325 MG per tablet 1 tablet (1 tablet Oral Given 12/03/17 2224)     Initial Impression / Assessment and Plan / ED Course  I have reviewed the triage vital signs and the nursing notes.  Pertinent labs & imaging results that were available during my care of the patient were reviewed by me and considered in my medical decision making (see chart for details).  Knee x-ray shows small effusion and fracture of femoral fixation screw. Discussed findings with on-call Orthopedic surgeon Dr. Eulah PontMurphy  who said surgical repair is not expected and could be managed outpatient.   Patient filling urine catheter bag during visit.   Patient observed ambulating using walker and has stable gait.   Final Clinical Impressions(s) / ED Diagnoses   Final diagnoses:  Pain of left lower extremity   Patient with left knee pain and found to have effusion and fracture of fixation screw on knee x-ray. He expresses preference to discharge home and is AOx3. He was able to ambulate with walker (has one at home) and has 24-hour care. No overlying skin changes to suggest cellulitis or vital sign instability to suggest septic joint. Recommended close follow-up with Dr. Eulah PontMurphy.   Planned to discharge patient with short rx for norco but already has morphine through hospice at home available for pain. Norco rx discarded.   Regarding penile/scrotal swelling, recommended elevating scrotum with rolled up towel while resting.   Dani GobbleHillary Hoang Reich, MD [facility], PGY-3    Casey BurkittFitzgerald, Elishua Radford Moen, MD 12/04/17 [id number]    Blane OharaZavitz, Joshua, MD 12/04/17 [phone]

## 2017-12-03 NOTE — ED Notes (Signed)
Now c/o lt knee pain.  He slipped from a chair today   He normally walks with a walker

## 2017-12-03 NOTE — ED Notes (Signed)
[facility] called for transport home. 

## 2017-12-04 DIAGNOSIS — J449 Chronic obstructive pulmonary disease, unspecified: Secondary | ICD-10-CM | POA: Diagnosis not present

## 2017-12-04 DIAGNOSIS — J918 Pleural effusion in other conditions classified elsewhere: Secondary | ICD-10-CM | POA: Diagnosis not present

## 2017-12-04 DIAGNOSIS — I4891 Unspecified atrial fibrillation: Secondary | ICD-10-CM | POA: Diagnosis not present

## 2017-12-04 DIAGNOSIS — I5040 Unspecified combined systolic (congestive) and diastolic (congestive) heart failure: Secondary | ICD-10-CM | POA: Diagnosis not present

## 2017-12-04 DIAGNOSIS — I5030 Unspecified diastolic (congestive) heart failure: Secondary | ICD-10-CM | POA: Diagnosis not present

## 2017-12-04 DIAGNOSIS — J9620 Acute and chronic respiratory failure, unspecified whether with hypoxia or hypercapnia: Secondary | ICD-10-CM | POA: Diagnosis not present

## 2017-12-04 DIAGNOSIS — F0391 Unspecified dementia with behavioral disturbance: Secondary | ICD-10-CM | POA: Diagnosis not present

## 2017-12-04 DIAGNOSIS — F0390 Unspecified dementia without behavioral disturbance: Secondary | ICD-10-CM | POA: Diagnosis not present

## 2017-12-05 DIAGNOSIS — J9620 Acute and chronic respiratory failure, unspecified whether with hypoxia or hypercapnia: Secondary | ICD-10-CM | POA: Diagnosis not present

## 2017-12-05 DIAGNOSIS — F0391 Unspecified dementia with behavioral disturbance: Secondary | ICD-10-CM | POA: Diagnosis not present

## 2017-12-05 DIAGNOSIS — I5030 Unspecified diastolic (congestive) heart failure: Secondary | ICD-10-CM | POA: Diagnosis not present

## 2017-12-05 DIAGNOSIS — J449 Chronic obstructive pulmonary disease, unspecified: Secondary | ICD-10-CM | POA: Diagnosis not present

## 2017-12-05 DIAGNOSIS — I4891 Unspecified atrial fibrillation: Secondary | ICD-10-CM | POA: Diagnosis not present

## 2017-12-05 DIAGNOSIS — J918 Pleural effusion in other conditions classified elsewhere: Secondary | ICD-10-CM | POA: Diagnosis not present

## 2017-12-06 ENCOUNTER — Encounter (HOSPITAL_COMMUNITY): Payer: Self-pay | Admitting: *Deleted

## 2017-12-06 ENCOUNTER — Emergency Department (HOSPITAL_COMMUNITY)

## 2017-12-06 ENCOUNTER — Other Ambulatory Visit: Payer: Self-pay

## 2017-12-06 ENCOUNTER — Inpatient Hospital Stay (HOSPITAL_COMMUNITY)
Admission: EM | Admit: 2017-12-06 | Discharge: 2017-12-29 | DRG: 291 | Disposition: E | Attending: Family Medicine | Admitting: Family Medicine

## 2017-12-06 DIAGNOSIS — I493 Ventricular premature depolarization: Secondary | ICD-10-CM | POA: Diagnosis not present

## 2017-12-06 DIAGNOSIS — W050XXA Fall from non-moving wheelchair, initial encounter: Secondary | ICD-10-CM | POA: Diagnosis present

## 2017-12-06 DIAGNOSIS — I495 Sick sinus syndrome: Secondary | ICD-10-CM | POA: Diagnosis present

## 2017-12-06 DIAGNOSIS — F0390 Unspecified dementia without behavioral disturbance: Secondary | ICD-10-CM | POA: Diagnosis present

## 2017-12-06 DIAGNOSIS — R7989 Other specified abnormal findings of blood chemistry: Secondary | ICD-10-CM

## 2017-12-06 DIAGNOSIS — Z515 Encounter for palliative care: Secondary | ICD-10-CM | POA: Diagnosis not present

## 2017-12-06 DIAGNOSIS — I130 Hypertensive heart and chronic kidney disease with heart failure and stage 1 through stage 4 chronic kidney disease, or unspecified chronic kidney disease: Secondary | ICD-10-CM | POA: Diagnosis not present

## 2017-12-06 DIAGNOSIS — E1122 Type 2 diabetes mellitus with diabetic chronic kidney disease: Secondary | ICD-10-CM | POA: Diagnosis present

## 2017-12-06 DIAGNOSIS — R778 Other specified abnormalities of plasma proteins: Secondary | ICD-10-CM | POA: Diagnosis present

## 2017-12-06 DIAGNOSIS — E785 Hyperlipidemia, unspecified: Secondary | ICD-10-CM | POA: Diagnosis present

## 2017-12-06 DIAGNOSIS — K219 Gastro-esophageal reflux disease without esophagitis: Secondary | ICD-10-CM | POA: Diagnosis present

## 2017-12-06 DIAGNOSIS — Z9981 Dependence on supplemental oxygen: Secondary | ICD-10-CM

## 2017-12-06 DIAGNOSIS — I4891 Unspecified atrial fibrillation: Secondary | ICD-10-CM | POA: Diagnosis not present

## 2017-12-06 DIAGNOSIS — Z87891 Personal history of nicotine dependence: Secondary | ICD-10-CM

## 2017-12-06 DIAGNOSIS — N179 Acute kidney failure, unspecified: Secondary | ICD-10-CM | POA: Diagnosis present

## 2017-12-06 DIAGNOSIS — Z7982 Long term (current) use of aspirin: Secondary | ICD-10-CM

## 2017-12-06 DIAGNOSIS — I110 Hypertensive heart disease with heart failure: Secondary | ICD-10-CM | POA: Diagnosis not present

## 2017-12-06 DIAGNOSIS — E669 Obesity, unspecified: Secondary | ICD-10-CM | POA: Diagnosis present

## 2017-12-06 DIAGNOSIS — I10 Essential (primary) hypertension: Secondary | ICD-10-CM | POA: Diagnosis present

## 2017-12-06 DIAGNOSIS — N189 Chronic kidney disease, unspecified: Secondary | ICD-10-CM

## 2017-12-06 DIAGNOSIS — R069 Unspecified abnormalities of breathing: Secondary | ICD-10-CM | POA: Diagnosis not present

## 2017-12-06 DIAGNOSIS — I5030 Unspecified diastolic (congestive) heart failure: Secondary | ICD-10-CM | POA: Diagnosis not present

## 2017-12-06 DIAGNOSIS — R296 Repeated falls: Secondary | ICD-10-CM | POA: Diagnosis present

## 2017-12-06 DIAGNOSIS — F0391 Unspecified dementia with behavioral disturbance: Secondary | ICD-10-CM | POA: Diagnosis not present

## 2017-12-06 DIAGNOSIS — R0602 Shortness of breath: Secondary | ICD-10-CM

## 2017-12-06 DIAGNOSIS — Z6830 Body mass index (BMI) 30.0-30.9, adult: Secondary | ICD-10-CM

## 2017-12-06 DIAGNOSIS — Z79899 Other long term (current) drug therapy: Secondary | ICD-10-CM

## 2017-12-06 DIAGNOSIS — J9611 Chronic respiratory failure with hypoxia: Secondary | ICD-10-CM | POA: Diagnosis present

## 2017-12-06 DIAGNOSIS — Z66 Do not resuscitate: Secondary | ICD-10-CM | POA: Diagnosis present

## 2017-12-06 DIAGNOSIS — I5043 Acute on chronic combined systolic (congestive) and diastolic (congestive) heart failure: Secondary | ICD-10-CM | POA: Diagnosis not present

## 2017-12-06 DIAGNOSIS — I2721 Secondary pulmonary arterial hypertension: Secondary | ICD-10-CM | POA: Diagnosis present

## 2017-12-06 DIAGNOSIS — N183 Chronic kidney disease, stage 3 unspecified: Secondary | ICD-10-CM | POA: Diagnosis present

## 2017-12-06 DIAGNOSIS — J918 Pleural effusion in other conditions classified elsewhere: Secondary | ICD-10-CM | POA: Diagnosis not present

## 2017-12-06 DIAGNOSIS — I509 Heart failure, unspecified: Secondary | ICD-10-CM

## 2017-12-06 DIAGNOSIS — J9621 Acute and chronic respiratory failure with hypoxia: Secondary | ICD-10-CM | POA: Diagnosis present

## 2017-12-06 DIAGNOSIS — N400 Enlarged prostate without lower urinary tract symptoms: Secondary | ICD-10-CM | POA: Diagnosis present

## 2017-12-06 DIAGNOSIS — E1129 Type 2 diabetes mellitus with other diabetic kidney complication: Secondary | ICD-10-CM | POA: Diagnosis present

## 2017-12-06 DIAGNOSIS — I5033 Acute on chronic diastolic (congestive) heart failure: Secondary | ICD-10-CM | POA: Diagnosis present

## 2017-12-06 DIAGNOSIS — Z7984 Long term (current) use of oral hypoglycemic drugs: Secondary | ICD-10-CM

## 2017-12-06 DIAGNOSIS — I480 Paroxysmal atrial fibrillation: Secondary | ICD-10-CM | POA: Diagnosis present

## 2017-12-06 DIAGNOSIS — J449 Chronic obstructive pulmonary disease, unspecified: Secondary | ICD-10-CM | POA: Diagnosis not present

## 2017-12-06 DIAGNOSIS — Z7951 Long term (current) use of inhaled steroids: Secondary | ICD-10-CM

## 2017-12-06 DIAGNOSIS — J9620 Acute and chronic respiratory failure, unspecified whether with hypoxia or hypercapnia: Secondary | ICD-10-CM | POA: Diagnosis not present

## 2017-12-06 DIAGNOSIS — R05 Cough: Secondary | ICD-10-CM | POA: Diagnosis not present

## 2017-12-06 DIAGNOSIS — R748 Abnormal levels of other serum enzymes: Secondary | ICD-10-CM | POA: Diagnosis present

## 2017-12-06 LAB — BASIC METABOLIC PANEL
ANION GAP: 16 — AB (ref 5–15)
BUN: 63 mg/dL — ABNORMAL HIGH (ref 6–20)
CALCIUM: 8.5 mg/dL — AB (ref 8.9–10.3)
CHLORIDE: 98 mmol/L — AB (ref 101–111)
CO2: 24 mmol/L (ref 22–32)
CREATININE: 2.42 mg/dL — AB (ref 0.61–1.24)
GFR calc non Af Amer: 22 mL/min — ABNORMAL LOW (ref >60)
GFR, EST AFRICAN AMERICAN: 25 mL/min — AB (ref >60)
Glucose, Bld: 154 mg/dL — ABNORMAL HIGH (ref 65–99)
Potassium: 4.2 mmol/L (ref 3.5–5.1)
SODIUM: 138 mmol/L (ref 135–145)

## 2017-12-06 LAB — I-STAT TROPONIN, ED: TROPONIN I, POC: 0.11 ng/mL — AB (ref 0.00–0.08)

## 2017-12-06 LAB — CBC
HCT: 33.2 % — ABNORMAL LOW (ref 39.0–52.0)
HEMOGLOBIN: 10.2 g/dL — AB (ref 13.0–17.0)
MCH: 27.7 pg (ref 26.0–34.0)
MCHC: 30.7 g/dL (ref 30.0–36.0)
MCV: 90.2 fL (ref 78.0–100.0)
PLATELETS: 194 10*3/uL (ref 150–400)
RBC: 3.68 MIL/uL — AB (ref 4.22–5.81)
RDW: 17.7 % — ABNORMAL HIGH (ref 11.5–15.5)
WBC: 6.7 10*3/uL (ref 4.0–10.5)

## 2017-12-06 NOTE — ED Triage Notes (Signed)
Pt from home after slipping from wheelchair into floor. Home health nurse called ems for assistance lifting patient. Pt noted to be sob with rales bilaterally. Also a hospice pt with DNR at bedside. EMS spoke with daughter and requested pt be transferred for worsening cough and requested all measures be taken if needed

## 2017-12-06 NOTE — ED Notes (Signed)
ED Provider at bedside. 

## 2017-12-06 NOTE — ED Provider Notes (Signed)
[facility]   CSN: [id number] Arrival date & time: 12/26/2017  2216     History   Chief Complaint Chief Complaint  Patient presents with  . Shortness of Breath    HPI [name] [name] is a 84 y.o. male.  The history is provided by the EMS personnel and a caregiver.  Shortness of Breath     Level 5 caveat: Dementia 84 year old male with history of chronic kidney disease stage III, diabetes, hyperlipidemia, hypertension, sinus syndrome, A. fib not on anticoagulation, CHF on home O2, chronic indwelling Foley secondary to BPH, presenting to the ED with shortness of breath.  History is provided by caregiver.  Patient is currently on hospice due to his advanced dementia.  Per caregiver, patient has been trying to get up out of his recliner and slid today but he did not fall or hurt himself.  He went on to the floor on his buttocks.  There was no loss of consciousness.  States she called EMS simply to get him out of the floor as she is unable to lift him independently, when EMS arrived they asked the patient would like to be transported to the emergency room and patient's daughter wanted him evaluated for some worsening shortness of breath.  Hospice nurse did notice this today and up to his morphine to try to make him comfortable.  Daughter states she would want treatment for any conditions such as exacerbation of his CHF, pneumonia, etc. but wants to uphold his DNR as to not have any extraordinary measures taken.  CNA who is with patient during the day states his breathing has been a little bit more labored and he has started to sound "wet".  There have been no fevers.  Past Medical History:  Diagnosis Date  . BPH with elevated PSA   . Chest pain   . Chronic kidney disease, stage III (moderate) (HCC)   . DM (diabetes mellitus), type 2 with renal complications (HCC)   . ED (erectile dysfunction)   . Glaucoma   . Hyperlipidemia   .  Hypertension   . Microalbuminuria   . Microalbuminuria 2013  . Obesity   . Osteoarthritis   . Prostatic hypertrophy, benign, with obstruction   . Renal calculus   . Sick sinus syndrome [facility](HCC)     Patient Active Problem List   Diagnosis Date Noted  . Dyspnea   . Advance care planning   . Goals of care, counseling/discussion   . Palliative care by specialist   . HCAP (healthcare-associated pneumonia) 11/10/2017  . Cellulitis and abscess of Bil leg/Rt > Lt 10/13/2017  . UTI (urinary tract infection) 10/13/2017  . Acute metabolic encephalopathy/UTI/AKI/Cellulitis 10/13/2017  . Acute-on-chronic kidney injury (HCC) 10/13/2017  . Encephalopathy 10/13/2017  . Hypokalemia 07/24/2017  . Closed intertrochanteric fracture of left femur (HCC)   . Bradycardia   . Closed left hip fracture, initial encounter (HCC) 06/21/2017  . Hypotension 06/21/2017  . Syncope 06/21/2017  . Iron deficiency anemia 06/21/2017  . Elevated troponin 06/21/2017  . Closed fracture of left hip (HCC)   . Acute on chronic diastolic CHF (congestive heart failure) (HCC) 12/01/2016  . Atrial fibrillation (HCC) 02/24/2016  . Bilateral leg weakness 09/17/2015  . Chest pain   . DM (diabetes mellitus), type 2 with renal complications (HCC)   . Hypertension   . Hyperlipidemia   . Prostatic hypertrophy, benign, with obstruction   . Sick sinus syndrome (HCC)   . Obesity   .  Microalbuminuria   . Glaucoma   . Renal calculus   . Osteoarthritis   . Chronic kidney disease, stage III (moderate) (HCC)   . BPH with elevated PSA   . ED (erectile dysfunction)   . RECTAL BLEEDING 04/22/2008  . DYSPHAGIA 04/22/2008  . HYPERLIPIDEMIA 04/18/2008  . HTN (hypertension) 04/18/2008  . ESOPHAGEAL STRICTURE 04/18/2008  . GERD 04/18/2008  . HIATAL HERNIA 04/18/2008  . DIVERTICULOSIS, COLON 04/18/2008  . COLONIC POLYPS, HYPERPLASTIC, HX OF 04/18/2008    Past Surgical History:  Procedure Laterality Date  . INTRAMEDULLARY (IM) NAIL  INTERTROCHANTERIC Left 06/21/2017   Procedure: INTRAMEDULLARY (IM) NAIL INTERTROCHANTRIC Left Leg;  Surgeon: Sheral Apley, MD;  Location: [facility] OR;  Service: Orthopedics;  Laterality: Left;  . INTRAVASCULAR PRESSURE WIRE/FFR STUDY N/A 04/18/2017   Procedure: Intravascular Pressure Wire/FFR Study;  Surgeon: Yates Decamp, MD;  Location: [facility] INVASIVE CV LAB;  Service: Cardiovascular;  Laterality: N/A;  Prox LAD  . KNEE SURGERY    . LOOP RECORDER INSERTION N/A 06/27/2017   Procedure: LOOP RECORDER INSERTION;  Surgeon: Hillis Range, MD;  Location: [facility] INVASIVE CV LAB;  Service: Cardiovascular;  Laterality: N/A;  . REPLACEMENT TOTAL KNEE  02/26/01       Home Medications    Prior to Admission medications   Medication Sig Start Date End Date Taking? Authorizing Provider  allopurinol (ZYLOPRIM) 100 MG tablet Take 100 mg by mouth daily.    [provider]  aspirin EC 325 MG tablet Take 325 mg by mouth daily.    [provider]  brinzolamide (AZOPT) 1 % ophthalmic suspension Place 1 drop into both eyes at bedtime.     [provider]  budesonide (PULMICORT) 0.25 MG/2ML nebulizer solution Take 2 mLs (0.25 mg total) by nebulization 2 (two) times daily. 11/19/17   Barnetta Chapel, MD  cefpodoxime (VANTIN) 200 MG tablet Take 1 tablet (200 mg total) by mouth daily. 10/16/17   Narda Bonds, MD  finasteride (PROSCAR) 5 MG tablet Take 5 mg by mouth daily.    [provider]  HYDROcodone-acetaminophen (NORCO) 5-325 MG tablet Take 1 tablet by mouth every 6 (six) hours as needed for moderate pain. 12/03/17   Casey Burkitt, MD  ipratropium-albuterol (DUONEB) 0.5-2.5 (3) MG/3ML SOLN Take 3 mLs by nebulization every 4 (four) hours as needed. 11/19/17   Berton Mount I, MD  ipratropium-albuterol (DUONEB) 0.5-2.5 (3) MG/3ML SOLN Take 3 mLs by nebulization 3 (three) times daily. 11/19/17   Barnetta Chapel, MD  metolazone (ZAROXOLYN) 2.5 MG tablet Take 2.5 mg by mouth  every other day. Filled 09-20-17    [provider]  Morphine Sulfate (MORPHINE CONCENTRATE) 10 MG/0.5ML SOLN concentrated solution Place 0.13 mLs (2.6 mg total) under the tongue every 2 (two) hours as needed for severe pain or shortness of breath. 11/19/17   Barnetta Chapel, MD  MYRBETRIQ 50 MG TB24 tablet Take 50 mg by mouth daily. 11/27/15   [provider]  polyethylene glycol (MIRALAX / GLYCOLAX) packet Take 17 g by mouth daily as needed for mild constipation. 10/16/17   Narda Bonds, MD  potassium chloride SA (K-DUR,KLOR-CON) 20 MEQ tablet Take 40 mEq by mouth 2 (two) times daily.     [provider]  sitaGLIPtin (JANUVIA) 25 MG tablet Take 1 tablet (25 mg total) by mouth daily. 10/16/17   Narda Bonds, MD  tadalafil, PAH, (ADCIRCA) 20 MG tablet Take 1 tablet (20 mg total) by mouth daily. 10/17/17  Narda Bonds, MD  torsemide (DEMADEX) 20 MG tablet Take 1 tablet (20 mg total) by mouth daily. 11/20/17   Berton Mount I, MD  travoprost, benzalkonium, (TRAVATAN) 0.004 % ophthalmic solution Place 1 drop into both eyes at bedtime.    [provider]    Family History Family History  Problem Relation Age of Onset  . Cancer Paternal Grandfather        STOMACH  . Cancer Sister        MALE CANCER  . Cancer - Colon Sister   . Stroke Father   . Healthy Child   . Healthy Child   . Other Child        BRAIN DAMAGE AT BIRTH    Social History Social History   Tobacco Use  . Smoking status: Former Smoker    Types: Cigarettes  . Smokeless tobacco: Never Used  Substance Use Topics  . Alcohol use: No  . Drug use: No     Allergies   Patient has no known allergies.   Review of Systems Review of Systems  Unable to perform ROS: Dementia     Physical Exam Updated Vital Signs BP (!) 101/59 Comment: Simultaneous filing. User may not have seen previous data.  Pulse 65 Comment: Simultaneous filing. User may not have seen previous data.   Temp (!) 96.7 F (35.9 C) (Temporal)   Resp (!) 21 Comment: Simultaneous filing. User may not have seen previous data.  SpO2 92% Comment: Simultaneous filing. User may not have seen previous data.  Physical Exam  Constitutional: He is oriented to person, place, and time. He appears well-developed and well-nourished.  HENT:  Head: Normocephalic and atraumatic.  Mouth/Throat: Oropharynx is clear and moist.  Eyes: Conjunctivae and EOM are normal. Pupils are equal, round, and reactive to light.  Neck: Normal range of motion.  Cardiovascular: Normal rate, regular rhythm and normal heart sounds.  Pulmonary/Chest: Effort normal. He has rales.  Rales noted, gurgling breath sounds  Abdominal: Soft. Bowel sounds are normal.  Genitourinary:  Genitourinary Comments: Groin is edematous with extension to scrotum and penis; indwelling foley in place  Musculoskeletal: Normal range of motion.  Neurological: He is alert and oriented to person, place, and time.  Skin: Skin is warm and dry.  Psychiatric: He has a normal mood and affect.  Nursing note and vitals reviewed.    ED Treatments / Results  Labs (all labs ordered are listed, but only abnormal results are displayed) Labs Reviewed  CBC - Abnormal; Notable for the following components:      Result Value   RBC 3.68 (*)    Hemoglobin 10.2 (*)    HCT 33.2 (*)    RDW 17.7 (*)    All other components within normal limits  I-STAT TROPONIN, ED - Abnormal; Notable for the following components:   Troponin i, poc 0.11 (*)    All other components within normal limits  BASIC METABOLIC PANEL  BRAIN NATRIURETIC PEPTIDE    EKG  EKG Interpretation  Date/Time:  Wednesday December 06 2017 22:25:54 EST Ventricular Rate:  68 PR Interval:    QRS Duration: 98 QT Interval:  612 QTC Calculation: 652 R Axis:   0 Text Interpretation:  Atrial fibrillation Anterior infarct, old Prolonged QT interval When compared to prior, no significant changes seen.,   No STEMI Confirmed by Theda Belfast ([id number]) on 12/02/2017 10:33:19 PM       Radiology Dg Chest Portable 1 View  Result Date: 12/19/2017 CLINICAL DATA:  Status post fall from wheelchair onto floor, with worsening cough. Shortness of breath. EXAM: PORTABLE CHEST 1 VIEW COMPARISON:  Chest radiograph performed 11/11/2017 FINDINGS: A small to moderate left-sided pleural effusion is again noted. Left basilar airspace opacity may reflect pneumonia or asymmetric pulmonary edema. No pneumothorax is seen. The cardiomediastinal silhouette is borderline normal in size. No acute osseous abnormalities are identified. IMPRESSION: Small to moderate left-sided pleural effusion again noted. Left basilar airspace opacity may reflect pneumonia or asymmetric pulmonary edema. Electronically Signed   By: Roanna Raider M.D.   On: 12-23-17 22:55    Procedures Procedures (including critical care time)  CRITICAL CARE Performed by: Garlon Hatchet   Total critical care time: 35 minutes  Critical care time was exclusive of separately billable procedures and treating other patients.  Critical care was necessary to treat or prevent imminent or life-threatening deterioration.  Critical care was time spent personally by me on the following activities: development of treatment plan with patient and/or surrogate as well as nursing, discussions with consultants, evaluation of patient's response to treatment, examination of patient, obtaining history from patient or surrogate, ordering and performing treatments and interventions, ordering and review of laboratory studies, ordering and review of radiographic studies, pulse oximetry and re-evaluation of patient's condition.   Medications Ordered in ED Medications - No data to display   Initial Impression / Assessment and Plan / ED Course  I have reviewed the triage vital signs and the nursing notes.  Pertinent labs & imaging results that were available during my care of  the patient were reviewed by me and considered in my medical decision making (see chart for details).  84 year old male presenting to the ED from home with shortness of breath.  Patient is on hospice secondary to very advanced dementia.  His daughter Arline Asp is his power of attorney, however sick with pneumonia currently so patient's CNA here with him.  EMS was initially called to help get patient out of the floor, however patient daughter wanted him evaluated for cough and shortness of breath.  Has history of recurrent CHF with similar symptoms.  There is not been any recent fever or acute changes in mental status.  On exam the patient's breathing is somewhat labored, gurgling breath sounds with rales.  Suspect recurrent CHF.  Work-up pending.    Patient's work-up most consistent with CHF exacerbation.  Does have elevated troponin, however may be multifactorial given AKI as well as apparent CHF.  Will need to be trended.  Will speak with daughter about her wishes of care going forward.  12:19 AM Spoke with patient's daughter Arline Asp, she would like him admitted to the hospital for ongoing care. She would like him treated for comfort/reversible conditions with medication, suction, etc but wants to uphold his DNR (no compressions, intubations, etc).  It sounds like he does not have full hospice resources at home (no hospital bed, suction, etc).  She did ask if some of this could be addressed as he would likely be more comfortable with better set up.  Feel this is reasonable.  She would like to be updated if there are any acute changes Cell-- [phone] Home-- [phone]  Case discussed with hospitalist, Dr. Antionette Char-- he will admit for ongoing care.  Final Clinical Impressions(s) / ED Diagnoses   Final diagnoses:  Acute on chronic congestive heart failure, unspecified heart failure type (HCC)  SOB (shortness of breath)  AKI (acute kidney injury) (HCC)  Elevated troponin    ED Discharge Orders  None       Garlon Hatchet, PA-C 12/07/17 0408    Tegeler, Canary Brim, MD 12/07/17 [phone]

## 2017-12-07 ENCOUNTER — Encounter (HOSPITAL_COMMUNITY): Payer: Self-pay | Admitting: Family Medicine

## 2017-12-07 ENCOUNTER — Inpatient Hospital Stay (HOSPITAL_COMMUNITY)

## 2017-12-07 DIAGNOSIS — Z7189 Other specified counseling: Secondary | ICD-10-CM | POA: Diagnosis not present

## 2017-12-07 DIAGNOSIS — Z6830 Body mass index (BMI) 30.0-30.9, adult: Secondary | ICD-10-CM | POA: Diagnosis not present

## 2017-12-07 DIAGNOSIS — I480 Paroxysmal atrial fibrillation: Secondary | ICD-10-CM | POA: Diagnosis present

## 2017-12-07 DIAGNOSIS — E669 Obesity, unspecified: Secondary | ICD-10-CM | POA: Diagnosis present

## 2017-12-07 DIAGNOSIS — N183 Chronic kidney disease, stage 3 (moderate): Secondary | ICD-10-CM | POA: Diagnosis not present

## 2017-12-07 DIAGNOSIS — Z66 Do not resuscitate: Secondary | ICD-10-CM | POA: Diagnosis present

## 2017-12-07 DIAGNOSIS — Z515 Encounter for palliative care: Secondary | ICD-10-CM | POA: Diagnosis not present

## 2017-12-07 DIAGNOSIS — I5033 Acute on chronic diastolic (congestive) heart failure: Secondary | ICD-10-CM

## 2017-12-07 DIAGNOSIS — R748 Abnormal levels of other serum enzymes: Secondary | ICD-10-CM

## 2017-12-07 DIAGNOSIS — Z7984 Long term (current) use of oral hypoglycemic drugs: Secondary | ICD-10-CM | POA: Diagnosis not present

## 2017-12-07 DIAGNOSIS — J9620 Acute and chronic respiratory failure, unspecified whether with hypoxia or hypercapnia: Secondary | ICD-10-CM | POA: Diagnosis not present

## 2017-12-07 DIAGNOSIS — Z7982 Long term (current) use of aspirin: Secondary | ICD-10-CM | POA: Diagnosis not present

## 2017-12-07 DIAGNOSIS — I493 Ventricular premature depolarization: Secondary | ICD-10-CM | POA: Diagnosis not present

## 2017-12-07 DIAGNOSIS — I10 Essential (primary) hypertension: Secondary | ICD-10-CM | POA: Diagnosis not present

## 2017-12-07 DIAGNOSIS — F0390 Unspecified dementia without behavioral disturbance: Secondary | ICD-10-CM | POA: Diagnosis present

## 2017-12-07 DIAGNOSIS — Z7951 Long term (current) use of inhaled steroids: Secondary | ICD-10-CM | POA: Diagnosis not present

## 2017-12-07 DIAGNOSIS — J449 Chronic obstructive pulmonary disease, unspecified: Secondary | ICD-10-CM | POA: Diagnosis not present

## 2017-12-07 DIAGNOSIS — R0603 Acute respiratory distress: Secondary | ICD-10-CM

## 2017-12-07 DIAGNOSIS — J9611 Chronic respiratory failure with hypoxia: Secondary | ICD-10-CM

## 2017-12-07 DIAGNOSIS — Z9981 Dependence on supplemental oxygen: Secondary | ICD-10-CM | POA: Diagnosis not present

## 2017-12-07 DIAGNOSIS — W050XXA Fall from non-moving wheelchair, initial encounter: Secondary | ICD-10-CM | POA: Diagnosis present

## 2017-12-07 DIAGNOSIS — I130 Hypertensive heart and chronic kidney disease with heart failure and stage 1 through stage 4 chronic kidney disease, or unspecified chronic kidney disease: Secondary | ICD-10-CM | POA: Diagnosis present

## 2017-12-07 DIAGNOSIS — R0602 Shortness of breath: Secondary | ICD-10-CM | POA: Diagnosis present

## 2017-12-07 DIAGNOSIS — I5030 Unspecified diastolic (congestive) heart failure: Secondary | ICD-10-CM | POA: Diagnosis not present

## 2017-12-07 DIAGNOSIS — F0391 Unspecified dementia with behavioral disturbance: Secondary | ICD-10-CM | POA: Diagnosis not present

## 2017-12-07 DIAGNOSIS — N179 Acute kidney failure, unspecified: Secondary | ICD-10-CM | POA: Diagnosis present

## 2017-12-07 DIAGNOSIS — J9621 Acute and chronic respiratory failure with hypoxia: Secondary | ICD-10-CM | POA: Diagnosis present

## 2017-12-07 DIAGNOSIS — I2721 Secondary pulmonary arterial hypertension: Secondary | ICD-10-CM | POA: Diagnosis present

## 2017-12-07 DIAGNOSIS — R4020 Unspecified coma: Secondary | ICD-10-CM | POA: Diagnosis not present

## 2017-12-07 DIAGNOSIS — R296 Repeated falls: Secondary | ICD-10-CM | POA: Diagnosis present

## 2017-12-07 DIAGNOSIS — I482 Chronic atrial fibrillation: Secondary | ICD-10-CM

## 2017-12-07 DIAGNOSIS — I4891 Unspecified atrial fibrillation: Secondary | ICD-10-CM | POA: Diagnosis not present

## 2017-12-07 DIAGNOSIS — K219 Gastro-esophageal reflux disease without esophagitis: Secondary | ICD-10-CM | POA: Diagnosis present

## 2017-12-07 DIAGNOSIS — N400 Enlarged prostate without lower urinary tract symptoms: Secondary | ICD-10-CM | POA: Diagnosis present

## 2017-12-07 DIAGNOSIS — E1122 Type 2 diabetes mellitus with diabetic chronic kidney disease: Secondary | ICD-10-CM | POA: Diagnosis not present

## 2017-12-07 DIAGNOSIS — J918 Pleural effusion in other conditions classified elsewhere: Secondary | ICD-10-CM | POA: Diagnosis not present

## 2017-12-07 DIAGNOSIS — E785 Hyperlipidemia, unspecified: Secondary | ICD-10-CM | POA: Diagnosis present

## 2017-12-07 DIAGNOSIS — I495 Sick sinus syndrome: Secondary | ICD-10-CM | POA: Diagnosis present

## 2017-12-07 LAB — INFLUENZA PANEL BY PCR (TYPE A & B)
INFLAPCR: NEGATIVE
Influenza B By PCR: NEGATIVE

## 2017-12-07 LAB — SODIUM, URINE, RANDOM: Sodium, Ur: 14 mmol/L

## 2017-12-07 LAB — CBG MONITORING, ED: GLUCOSE-CAPILLARY: 138 mg/dL — AB (ref 65–99)

## 2017-12-07 LAB — TROPONIN I
Troponin I: 0.09 ng/mL (ref <0.03)
Troponin I: 0.09 ng/mL (ref <0.03)

## 2017-12-07 LAB — CREATININE, URINE, RANDOM: CREATININE, URINE: 72.28 mg/dL

## 2017-12-07 LAB — PROCALCITONIN: PROCALCITONIN: 0.81 ng/mL

## 2017-12-07 LAB — BRAIN NATRIURETIC PEPTIDE: B Natriuretic Peptide: 791.5 pg/mL — ABNORMAL HIGH (ref 0.0–100.0)

## 2017-12-07 MED ORDER — ACETAMINOPHEN 325 MG PO TABS: 650.0000 mg | ORAL_TABLET | ORAL | Status: DC | PRN

## 2017-12-07 MED ORDER — HYDROMORPHONE HCL 1 MG/ML IJ SOLN
1.0000 mg | INTRAMUSCULAR | Status: DC | PRN
  Administered 2017-12-07: 1 mg via INTRAVENOUS
  Filled 2017-12-07: qty 1

## 2017-12-07 MED ORDER — POLYETHYLENE GLYCOL 3350 17 G PO PACK: 17.0000 g | PACK | Freq: Every day | ORAL | Status: DC | PRN

## 2017-12-07 MED ORDER — ASPIRIN EC 325 MG PO TBEC: 325.0000 mg | DELAYED_RELEASE_TABLET | Freq: Every day | ORAL | Status: DC

## 2017-12-07 MED ORDER — SODIUM CHLORIDE 0.9% FLUSH
3.0000 mL | INTRAVENOUS | Status: DC | PRN
  Administered 2017-12-07: 3 mL via INTRAVENOUS

## 2017-12-07 MED ORDER — GLYCOPYRROLATE 0.2 MG/ML IJ SOLN
0.4000 mg | Freq: Four times a day (QID) | INTRAMUSCULAR | Status: DC
  Administered 2017-12-07 – 2017-12-08 (×3): 0.4 mg via INTRAVENOUS
  Filled 2017-12-07 (×4): qty 2

## 2017-12-07 MED ORDER — BUDESONIDE 0.25 MG/2ML IN SUSP
0.2500 mg | Freq: Two times a day (BID) | RESPIRATORY_TRACT | Status: DC
  Administered 2017-12-07 (×2): 0.25 mg via RESPIRATORY_TRACT
  Filled 2017-12-07 (×3): qty 2

## 2017-12-07 MED ORDER — MIRABEGRON ER 50 MG PO TB24: 50.0000 mg | ORAL_TABLET | Freq: Every day | ORAL | Status: DC

## 2017-12-07 MED ORDER — BRINZOLAMIDE 1 % OP SUSP
1.0000 [drp] | Freq: Every day | OPHTHALMIC | Status: DC
  Filled 2017-12-07: qty 10

## 2017-12-07 MED ORDER — HYDROMORPHONE HCL 1 MG/ML IJ SOLN
2.0000 mg | INTRAMUSCULAR | Status: AC
  Administered 2017-12-07: 2 mg via INTRAVENOUS
  Filled 2017-12-07: qty 2

## 2017-12-07 MED ORDER — MORPHINE SULFATE (CONCENTRATE) 10 MG/0.5ML PO SOLN: 2.5000 mg | ORAL | Status: DC | PRN

## 2017-12-07 MED ORDER — TADALAFIL (PAH) 20 MG PO TABS: 20.0000 mg | ORAL_TABLET | Freq: Every day | ORAL | Status: DC

## 2017-12-07 MED ORDER — ONDANSETRON HCL 4 MG/2ML IJ SOLN: 4.0000 mg | Freq: Four times a day (QID) | INTRAMUSCULAR | Status: DC | PRN

## 2017-12-07 MED ORDER — HYDROMORPHONE BOLUS VIA INFUSION
1.0000 mg | INTRAVENOUS | Status: DC | PRN
  Filled 2017-12-07: qty 4

## 2017-12-07 MED ORDER — CEFEPIME HCL 1 G IJ SOLR
1.0000 g | INTRAMUSCULAR | Status: DC
Start: 2017-12-07 — End: 2017-12-07
  Filled 2017-12-07: qty 1

## 2017-12-07 MED ORDER — LORAZEPAM 2 MG/ML IJ SOLN: 1.0000 mg | INTRAMUSCULAR | Status: DC | PRN

## 2017-12-07 MED ORDER — FUROSEMIDE 10 MG/ML IJ SOLN
40.0000 mg | INTRAMUSCULAR | Status: AC
  Administered 2017-12-07: 40 mg via INTRAVENOUS
  Filled 2017-12-07: qty 4

## 2017-12-07 MED ORDER — FUROSEMIDE 10 MG/ML IJ SOLN
40.0000 mg | Freq: Two times a day (BID) | INTRAMUSCULAR | Status: DC
  Administered 2017-12-07 (×2): 40 mg via INTRAVENOUS
  Filled 2017-12-07 (×2): qty 4

## 2017-12-07 MED ORDER — IPRATROPIUM-ALBUTEROL 0.5-2.5 (3) MG/3ML IN SOLN: 3.0000 mL | Freq: Three times a day (TID) | RESPIRATORY_TRACT | Status: DC | PRN

## 2017-12-07 MED ORDER — INSULIN ASPART 100 UNIT/ML ~~LOC~~ SOLN
0.0000 [IU] | SUBCUTANEOUS | Status: DC
  Administered 2017-12-07: 1 [IU] via SUBCUTANEOUS
  Filled 2017-12-07: qty 1

## 2017-12-07 MED ORDER — SODIUM CHLORIDE 0.9 % IV SOLN
2.0000 mg/h | INTRAVENOUS | Status: DC
  Administered 2017-12-07: 1 mg/h via INTRAVENOUS
  Filled 2017-12-07: qty 5

## 2017-12-07 MED ORDER — SODIUM CHLORIDE 0.9% FLUSH
3.0000 mL | Freq: Two times a day (BID) | INTRAVENOUS | Status: DC
  Administered 2017-12-07: 3 mL via INTRAVENOUS

## 2017-12-07 MED ORDER — IPRATROPIUM-ALBUTEROL 0.5-2.5 (3) MG/3ML IN SOLN
3.0000 mL | Freq: Three times a day (TID) | RESPIRATORY_TRACT | Status: DC
  Administered 2017-12-07 (×2): 3 mL via RESPIRATORY_TRACT
  Filled 2017-12-07 (×2): qty 3

## 2017-12-07 MED ORDER — HYDROCODONE-ACETAMINOPHEN 5-325 MG PO TABS: 1.0000 | ORAL_TABLET | Freq: Four times a day (QID) | ORAL | Status: DC | PRN

## 2017-12-07 MED ORDER — ALLOPURINOL 100 MG PO TABS
100.0000 mg | ORAL_TABLET | Freq: Every day | ORAL | Status: DC
  Filled 2017-12-07: qty 1

## 2017-12-07 MED ORDER — LORAZEPAM 2 MG/ML IJ SOLN
1.0000 mg | INTRAMUSCULAR | Status: DC | PRN
  Administered 2017-12-07 – 2017-12-08 (×2): 2 mg via INTRAVENOUS
  Filled 2017-12-07 (×2): qty 1

## 2017-12-07 MED ORDER — LATANOPROST 0.005 % OP SOLN
1.0000 [drp] | Freq: Every day | OPHTHALMIC | Status: DC
  Filled 2017-12-07: qty 2.5

## 2017-12-07 MED ORDER — BUDESONIDE 0.25 MG/2ML IN SUSP: 0.2500 mg | Freq: Two times a day (BID) | RESPIRATORY_TRACT | Status: DC

## 2017-12-07 MED ORDER — SODIUM CHLORIDE 0.9 % IV SOLN: 250.0000 mL | INTRAVENOUS | Status: DC | PRN

## 2017-12-07 MED ORDER — HYDROMORPHONE HCL 1 MG/ML IJ SOLN: 2.0000 mg | INTRAMUSCULAR | Status: DC | PRN

## 2017-12-07 MED ORDER — HEPARIN SODIUM (PORCINE) 5000 UNIT/ML IJ SOLN
5000.0000 [IU] | Freq: Three times a day (TID) | INTRAMUSCULAR | Status: DC
  Administered 2017-12-07: 5000 [IU] via SUBCUTANEOUS
  Filled 2017-12-07: qty 1

## 2017-12-07 MED ORDER — TRAVOPROST 0.004 % OP SOLN: 1.0000 [drp] | Freq: Every day | OPHTHALMIC | Status: DC

## 2017-12-07 MED ORDER — FINASTERIDE 5 MG PO TABS: 5.0000 mg | ORAL_TABLET | Freq: Every day | ORAL | Status: DC

## 2017-12-07 NOTE — Progress Notes (Signed)
Palliative Medicine RN Note: Follow up for symptom management. He is breathing more comfortably but is still working hard to move air. He received 1mg  hydromorphone at 1150 (it is now 1310).   Milky tan secretions suctioned from mouth (approx 30 ml). He is still very wet. With very wet, very coarse lung sounds. I spoke with PMT NP Yong ChannelAlicia Parker, who gave stat orders for suspected pulmonary edema (Dr Phillips OdorGolding is unavailable).  Order still for SDU. I have paged Dr Sharolyn DouglasEzenduka again to request order for bed on [facility].  Margret ChanceMelanie G. Najir Roop, RN, BSN, [facility] Palliative Medicine Team 12/07/2017 1:23 PM Office [phone]

## 2017-12-07 NOTE — Progress Notes (Signed)
Pharmacy Antibiotic Note  [name] DockVernon E [name] is a 84 y.o. male admitted on 12/28/2017 with pneumonia.  Pharmacy has been consulted for cefepime dosing.  CXR notable for persistent small to moderate L pleural effusion and left base airspace opacity. Currently on hospice but family electing to treat infection. WBC is 6.7. Afebrile. Renal function is reduced (Scr 2.42, CrCl 22 mL/min).   Plan: Cefepime 1 gram every 24 hours Monitor renal function, cx results, clinical picture    Temp (24hrs), Avg:97.7 F (36.5 C), Min:96.7 F (35.9 C), Max:98.6 F (37 C)  Recent Labs  Lab Jun 23, 2018 2224  WBC 6.7  CREATININE 2.42*    Estimated Creatinine Clearance: 22.4 mL/min (A) (by C-G formula based on SCr of 2.42 mg/dL (H)).    No Known Allergies  Antimicrobials this admission: Cefepime 2/7 >>   Dose adjustments this admission: N/A  Microbiology results: None  Thank you for allowing pharmacy to be a part of this patient's care.  Girard CooterKimberly Perkins, PharmD Clinical Pharmacist  Pager: [phone] Clinical Phone for 12/07/2017 until 3:30pm: [phone] If after 3:30pm, please call main pharmacy at [phone] 12/07/2017 10:31 AM

## 2017-12-07 NOTE — ED Notes (Signed)
The pt moved to a regular hospital bed for comfort.  He remains very congested and wide awake

## 2017-12-07 NOTE — ED Notes (Signed)
The pt will not keep on his 02 in place  Her literally rattles in his chest   Oral  Suctioning not helpful

## 2017-12-07 NOTE — Progress Notes (Signed)
[facility] Liaison:  RN visit  Met with patient/CNA caregiver at bedside.  Patient is short of breath, increased secretions (requiring suctioning), and with terminal agitation.  I contacted Threasa Beards, PMT to confirm that patient seems to be transitioning to end of life.  Threasa Beards did come and see patient with me and talked with daughter.  Daughter is willing to transition patient to comfort care only and to not pursue any life prolonging measures.  She did discuss [employer] as an option with daughter, but feels symptom management to get more stable is what needs to be done at this time.  PMT will follow for symptom management.   We will continue to monitor patient while hospitalized.    Thank you,  Edyth Gunnels, RN, BSN [facility] Liaison [phone]  All hospital liaisons are now on [place].

## 2017-12-07 NOTE — Progress Notes (Signed)
Palliative Medicine RN Note: Consult order noted.  Patient is active with [facility] for home hospice. I spoke with [name] [name], their RN liason. She reports that GOC are clear; family wants to treat infection if necessary then return home with hospice.   When patients are active with a hospice provider, that provider manages GOC conversations, as they also coordinate d/c plan. Therefore, at this time, per the hospice rep, there is not a role for PMT. She reports that they will address these issues, but they seem already clear.   PMT will not see Mr [name] [name] at this time. [facility] will notify our team if new concerns or needs arise.   [name] ChanceMelanie G. Emelin Dascenzo, RN, BSN, [facility] Palliative Medicine Team 12/07/2017 10:04 AM Office [phone]

## 2017-12-07 NOTE — ED Notes (Signed)
Attending paged regarding changing admission from [facility]down to [facility] for palliative care

## 2017-12-07 NOTE — ED Notes (Signed)
2 unsuccessful attempts to sTART AN NIV

## 2017-12-07 NOTE — ED Notes (Signed)
The pt continues to cough intermittently  And he cannot be suctioned enough to remove the rattle in his chest

## 2017-12-07 NOTE — Consult Note (Signed)
Consultation Note Date: 12/07/2017   Patient Name: [name] [name]  DOB: 11/28/1924  MRN: [id number]  Age / Sex: 84 y.o., male  PCP: Tisovec, Fransico Him, MD Referring Physician: Vianne Bulls, MD  Reason for Consultation: Terminal Care  HPI/Patient Profile: 84 y.o. male  with past medical history of diastolic heart failure (on hospice care at home), pulmonary hypertension, sick sinus syndrome, chest pain, CKD stage III, BPH, DM admitted on 12/23/2017 with respiratory distress and increased lethargy r/t CHF exacerbation and worsening renal function. He continued to decline while in ED and palliative care requested to assist in EOL management.   Clinical Assessment and Goals of Care: I met at [name] [name]'s bedside today with Lexine Baton, NP and palliative RN Melanie. [name] [name] is in respiratory distress currently. Worked to provide Lasix and opioids for relief. He responded well to dilaudid and appeared much more calm after dose. I also discussed with daughter, Caren Griffins, over the phone who confirms goal for comfort care. We discussed my recommendation for dilaudid infusion and she agrees with anything needed to provide comfort so he does not suffer. I did share that at this time I do not feel that he is stable to be transported anywhere outside the hospital but that this could always change. She asked about prognosis and I explained that prognosis is extremely poor and could be hours to days. Daughter is ill but planning to come to bedside shortly. Threasa Beards will follow up on symptom management and comfort later today.  Emotional support provided. All questions/concerns addressed. Also updated private caregiver at bedside of our plan and expectations.   Primary Decision Maker NEXT OF KIN [name]    SUMMARY OF RECOMMENDATIONS   - DNR - Full comfort care  Code Status/Advance Care Planning:  DNR   Symptom  Management:   Respiratory distress: Continue Lasix (unclear how effective this will be with renal dysfunction). Beginning Dilaudid infusion 1 mg/hr to better provide comfort as he responded well to repeat bolus of dilaudid but initial dose did not provide any relief. Liberal bolus dilaudid 1-4 mg every 30 min prn.   Robinul scheduled  Ativan prn agitation  Palliative Prophylaxis:   Aspiration, Bowel Regimen, Delirium Protocol, Frequent Pain Assessment, Oral Care and Turn Reposition  Additional Recommendations (Limitations, Scope, Preferences):  Full Comfort Care  Psycho-social/Spiritual:   Desire for further Chaplaincy support:yes  Additional Recommendations: Grief/Bereavement Support  Prognosis:   Hours - Days  Discharge Planning: Likely hospital death     Primary Diagnoses: Present on Admission: . Atrial fibrillation ([place]) . Acute on chronic diastolic CHF (congestive heart failure) ([place]) . Chronic kidney disease, stage III (moderate) (HCC) . DM (diabetes mellitus), type 2 with renal complications ([place]) . Hypertension . Elevated troponin . Acute-on-chronic kidney injury ([place]) . Chronic respiratory failure with hypoxia ([place])   I have reviewed the medical record, interviewed the patient and family, and examined the patient. The following aspects are pertinent.  Past Medical History:  Diagnosis Date  . BPH with elevated PSA   .  Chest pain   . Chronic kidney disease, stage III (moderate) (HCC)   . DM (diabetes mellitus), type 2 with renal complications ([place])   . ED (erectile dysfunction)   . Glaucoma   . Hyperlipidemia   . Hypertension   . Microalbuminuria   . Microalbuminuria 2013  . Obesity   . Osteoarthritis   . Prostatic hypertrophy, benign, with obstruction   . Renal calculus   . Sick sinus syndrome [facility])    Social History   Socioeconomic History  . Marital status: Married    Spouse name: None  . Number of children: None  . Years of education:  None  . Highest education level: None  Social Needs  . Financial resource strain: None  . Food insecurity - worry: None  . Food insecurity - inability: None  . Transportation needs - medical: None  . Transportation needs - non-medical: None  Occupational History  . None  Tobacco Use  . Smoking status: Former Smoker    Types: Cigarettes  . Smokeless tobacco: Never Used  Substance and Sexual Activity  . Alcohol use: No  . Drug use: No  . Sexual activity: None  Other Topics Concern  . None  Social History Narrative  . None   Family History  Problem Relation Age of Onset  . Cancer Paternal Grandfather        STOMACH  . Cancer Sister        MALE CANCER  . Cancer - Colon Sister   . Stroke Father   . Healthy Child   . Healthy Child   . Other Child        BRAIN DAMAGE AT BIRTH   Scheduled Meds: . brinzolamide  1 drop Both Eyes QHS  . budesonide  0.25 mg Nebulization BID  . furosemide  40 mg Intravenous Q12H  . furosemide  40 mg Intravenous STAT  . glycopyrrolate  0.4 mg Intravenous QID  .  HYDROmorphone (DILAUDID) injection  2 mg Intravenous STAT  . ipratropium-albuterol  3 mL Nebulization TID  . latanoprost  1 drop Both Eyes QHS  . sodium chloride flush  3 mL Intravenous Q12H   Continuous Infusions: . sodium chloride    . HYDROmorphone     PRN Meds:.sodium chloride, acetaminophen, HYDROmorphone, HYDROmorphone (DILAUDID) injection, LORazepam, ondansetron (ZOFRAN) IV, sodium chloride flush No Known Allergies Review of Systems  Unable to perform ROS: Acuity of condition    Physical Exam  Constitutional: He appears well-developed. He appears lethargic. He appears distressed.  HENT:  Head: Normocephalic and atraumatic.  Cardiovascular: Tachycardia present.  Pulmonary/Chest: Accessory muscle usage present. Tachypnea noted. He is in respiratory distress. He has rhonchi. He has rales.  Abdominal: He exhibits distension.  Neurological: He appears lethargic.     Vital Signs: BP 108/61   Pulse 71   Temp 98.6 F (37 C) (Rectal)   Resp 19   SpO2 94%      Pain Score: 0-No pain   SpO2: SpO2: 94 % O2 Device:SpO2: 94 % O2 Flow Rate: .O2 Flow Rate (L/min): 15 L/min  IO: Intake/output summary: No intake or output data in the 24 hours ending 12/07/17 1358  LBM:   Baseline Weight:   Most recent weight:       Palliative Assessment/Data: 20%   Flowsheet Rows     Most Recent Value  Intake Tab  Referral Department  Hospitalist  Unit at Time of Referral  ER  Palliative Care Primary Diagnosis  Pulmonary  Date Notified  12/07/17  Palliative Care Type  Not seen  Reason Not Seen  -- [facility] GIP,  goals already clear]  Reason for referral  Clarify Goals of Care  Date of Admission  12/23/2017  # of days IP prior to Palliative referral  1  Clinical Assessment  Psychosocial & Spiritual Assessment  Palliative Care Outcomes      Time Total: 60 min  Greater than 50%  of this time was spent counseling and coordinating care related to the above assessment and plan.  Signed by: Vinie Sill, NP Palliative Medicine Team Pager # [phone] (M-F 8a-5p) Team Phone # [phone] (Nights/Weekends)

## 2017-12-07 NOTE — Progress Notes (Signed)
   Follow Up Note  HPI: Please refer to full H&P for details  [name] [name] is a 84 y.o. male with medical history significant for dementia, diabetes mellitus, hypertension, chronic kidney disease stage III, pulmonary artery hypertension, chronic diastolic CHF, and sick sinus syndrome, now presenting from home for evaluation of increased lethargy, respiratory distress and frequent falls for the past couple of days. Pt was brought into the ED for further evaluation. In the ED, pt was saturating 91% on 4 L/min of supplemental oxygen, CXR notable for a persistent small to moderate left pleural effusion and left base airspace opacity suggestive of pneumonia versus asymmetric edema. BNP is elevated to 792. Pt initially admitted under telemetry.  Today, upon further evaluations, pt resp status noted to be significantly worsening requiring very frequent suctioning and higher O2 needs. Pt noted to be more lethargic and restless. Pt has been followed closely with home hospice. Palliative and hospice team were consulted in the ED, agreed that prognosis is very poor with hrs-days. Pt transitioned to comfort care and admitted under palliative team.  Exam: CV: Irregular, no added hrt sound Lungs: Coarse rales bilaterally Abd: Soft, non-distended, non tender, BS present Ext: 2+ pitting edema B/L LE  Present on Admission: . Atrial fibrillation (HCC) . Acute on chronic diastolic CHF (congestive heart failure) (HCC) . Chronic kidney disease, stage III (moderate) (HCC) . DM (diabetes mellitus), type 2 with renal complications (HCC) . Hypertension . Elevated troponin . Acute-on-chronic kidney injury (HCC) . Chronic respiratory failure with hypoxia (HCC)   Disposition: Comfort care by palliative team

## 2017-12-07 NOTE — ED Notes (Signed)
Attempted report 

## 2017-12-07 NOTE — Progress Notes (Signed)
Palliative Medicine RN Note: Initially consulted for GOC, which is handled by [facility]. However, once [facility] RN saw the patient, she requested PMT back up/eval.  I saw Mr [name] [name] in the ED. He is on a [place]. Outside foley in place. He is agitated with a non-focused stare upward. He has significant, loud secretions that cannot be removed with a yankauer. Breathing is hard and labored with a rate of about 30. Genitals are swollen; outside foley is in place (was placed by [facility] at home). Cr >2. Flu swab has been collected but not resulted. Continuous pulse ox and tele are in place.  Home/private CNA is at bedside. She reports he was "normal" Monday 2/4 but that he fell Tuesday, and she feels he has not gotten any better. Apparently he was able to ride a scooter around the grocery store Monday.   Discussed patient with Dr Phillips [facility]Golding [facility]. Dr Phillips OdorGolding feels that Mr [name] [name] is nearing death and that his changes are related to this. She changed pain regimen to hydromorphone because of renal function. She also indicated a fear that IV antibiotics won't stop the dying process at this point, but they may get Mr [name] [name] well enough to draw out his death.   I called his daughter Gaston IslamCynthia [name] at [phone] (cell [phone]). We discussed Dr Lamar BlinksGolding's recommendations and concerns that this is the end of [name]'s life. [name] verbalized understanding and reports that she had seem big changes in her father's functioning. We specifically discussed aggressive comfort measures and the probable drawbacks of IV abx per Dr Lamar BlinksGolding's recommendations. She clearly stated that she does not want his death prolonged and that she wants him to be comfortable. [facility]. Amy and I had discussed this; there is no bed availability today, but if Mr [name] [name] is stable enough tomorrow to consider transfer, we will do so if a bed opens up.  After this discussion, I obtained orders for aggressive  symptom control. Updated RN. I also paged Dr Sharolyn DouglasEzenduka. If Mr [name] [name] will be comfort measures only, PMT recommends he NOT go to a SDU, as [facility] [place] will be able to meet his needs. [facility] will admit as GIP with [facility] as attending. PMT will follow for symptom management.   Margret ChanceMelanie G. Zeric Baranowski, RN, BSN, [facility] Palliative Medicine Team 12/07/2017 12:29 PM Office [phone]

## 2017-12-07 NOTE — Progress Notes (Signed)
Palliative Medicine RN Note:  Symptom follow up. He remains dyspneic despite dilaudid continuous infusion, although he is much more comfortable than he was earlier. I spoke with NP Yong ChannelAlicia Parker, who ordered an increase in Dilaudid infusion. Plan to move pt to 4E soon.   Margret ChanceMelanie G. Arabela Basaldua, RN, BSN, [facility] Palliative Medicine Team 12/26/2017 8:43 AM Office [phone]

## 2017-12-07 NOTE — ED Notes (Signed)
Patient's oxygen delivery changed from NRB to Venturi mask with 55% FiO2. Patient mantaining SpO2 of 95%. No change in mental status.

## 2017-12-07 NOTE — H&P (Signed)
History and Physical    [name] [name] ZOX:[id number] DOB: Mar 08, 1925 DOA: December 22, 2017  PCP: Gaspar Garbe, MD   Patient coming from: Home  Chief Complaint: Respiratory distress, increased lethargy   HPI: [name] [name] is a 84 y.o. male with medical history significant for dementia, diabetes mellitus, hypertension, chronic kidney disease stage III, pulmonary artery hypertension, chronic diastolic CHF, and sick sinus syndrome, now presenting from home for evaluation of increased lethargy and respiratory distress.  Patient has apparently been noted to have increased work of breathing over the past couple days, as well as increased lethargy.  His home health aide gave him more morphine to help with his work of breathing.  He was trying to get out of his chair unassisted today, slipped to the ground, health aide had difficulty lifting him up, and called EMS for assistance.  Patient was noted to have respiratory distress and marked lethargy.  He was brought into the ED for further evaluation.  ED Course: Upon arrival to the ED, patient is found to be afebrile, saturating 91% on 4 L/min of supplemental oxygen, and with vitals otherwise normal.  EKG features atrial fibrillation with PVCs and her chest x-ray is notable for a persistent small to moderate left pleural effusion and left base airspace opacity suggestive of pneumonia versus asymmetric edema.  Chemistry panel is notable for a BUN of 63 and creatinine of 2.42, up from 1.60 two weeks earlier.  CBC features a chronic normocytic anemia, troponin is elevated to 0.11, and BNP is elevated to 792.  Patient was treated with 40 mg IV Lasix in the ED.  He remains hemodynamically stable and will be admitted to the telemetry unit for ongoing evaluation and suspected acute on chronic diastolic CHF.  Review of Systems:  Unable to complete ROS secondary to patient's clinical condition.  Past Medical History:  Diagnosis Date  . BPH with elevated PSA    . Chest pain   . Chronic kidney disease, stage III (moderate) (HCC)   . DM (diabetes mellitus), type 2 with renal complications (HCC)   . ED (erectile dysfunction)   . Glaucoma   . Hyperlipidemia   . Hypertension   . Microalbuminuria   . Microalbuminuria 2013  . Obesity   . Osteoarthritis   . Prostatic hypertrophy, benign, with obstruction   . Renal calculus   . Sick sinus syndrome [facility])     Past Surgical History:  Procedure Laterality Date  . INTRAMEDULLARY (IM) NAIL INTERTROCHANTERIC Left 06/21/2017   Procedure: INTRAMEDULLARY (IM) NAIL INTERTROCHANTRIC Left Leg;  Surgeon: Sheral Apley, MD;  [facility] OR;  Service: Orthopedics;  Laterality: Left;  . INTRAVASCULAR PRESSURE WIRE/FFR STUDY N/A 04/18/2017   Procedure: Intravascular Pressure Wire/FFR Study;  Surgeon: Yates Decamp, MD;  Location[facility] INVASIVE CV LAB;  Service: Cardiovascular;  Laterality: N/A;  Prox LAD  . KNEE SURGERY    . LOOP RECORDER INSERTION N/A 06/27/2017   Procedure: LOOP RECORDER INSERTION;  Surgeon: Hillis Range, MD;  Location: [facility] INVASIVE CV LAB;  Service: Cardiovascular;  Laterality: N/A;  . REPLACEMENT TOTAL KNEE  02/26/01     reports that he has quit smoking. His smoking use included cigarettes. he has never used smokeless tobacco. He reports that he does not drink alcohol or use drugs.  No Known Allergies  Family History  Problem Relation Age of Onset  . Cancer Paternal Grandfather        STOMACH  . Cancer Sister  MALE CANCER  . Cancer - Colon Sister   . Stroke Father   . Healthy Child   . Healthy Child   . Other Child        BRAIN DAMAGE AT BIRTH     Prior to Admission medications   Medication Sig Start Date End Date Taking? Authorizing Provider  allopurinol (ZYLOPRIM) 100 MG tablet Take 100 mg by mouth daily.    [provider]  aspirin EC 325 MG tablet Take 325 mg by mouth daily.    [provider]  brinzolamide (AZOPT) 1 % ophthalmic suspension Place 1  drop into both eyes at bedtime.     [provider]  budesonide (PULMICORT) 0.25 MG/2ML nebulizer solution Take 2 mLs (0.25 mg total) by nebulization 2 (two) times daily. 11/19/17   Barnetta Chapelgbata, Sylvester I, MD  cefpodoxime (VANTIN) 200 MG tablet Take 1 tablet (200 mg total) by mouth daily. 10/16/17   Narda BondsNettey, Ralph A, MD  finasteride (PROSCAR) 5 MG tablet Take 5 mg by mouth daily.    [provider]  HYDROcodone-acetaminophen (NORCO) 5-325 MG tablet Take 1 tablet by mouth every 6 (six) hours as needed for moderate pain. 12/03/17   Casey BurkittFitzgerald, Hillary Moen, MD  ipratropium-albuterol (DUONEB) 0.5-2.5 (3) MG/3ML SOLN Take 3 mLs by nebulization every 4 (four) hours as needed. 11/19/17   Berton Mountgbata, Sylvester I, MD  ipratropium-albuterol (DUONEB) 0.5-2.5 (3) MG/3ML SOLN Take 3 mLs by nebulization 3 (three) times daily. 11/19/17   Barnetta Chapelgbata, Sylvester I, MD  metolazone (ZAROXOLYN) 2.5 MG tablet Take 2.5 mg by mouth every other day. Filled 09-20-17    [provider]  Morphine Sulfate (MORPHINE CONCENTRATE) 10 MG/0.5ML SOLN concentrated solution Place 0.13 mLs (2.6 mg total) under the tongue every 2 (two) hours as needed for severe pain or shortness of breath. 11/19/17   Barnetta Chapelgbata, Sylvester I, MD  MYRBETRIQ 50 MG TB24 tablet Take 50 mg by mouth daily. 11/27/15   [provider]  polyethylene glycol (MIRALAX / GLYCOLAX) packet Take 17 g by mouth daily as needed for mild constipation. 10/16/17   Narda BondsNettey, Ralph A, MD  potassium chloride SA (K-DUR,KLOR-CON) 20 MEQ tablet Take 40 mEq by mouth 2 (two) times daily.     [provider]  sitaGLIPtin (JANUVIA) 25 MG tablet Take 1 tablet (25 mg total) by mouth daily. 10/16/17   Narda BondsNettey, Ralph A, MD  tadalafil, PAH, (ADCIRCA) 20 MG tablet Take 1 tablet (20 mg total) by mouth daily. 10/17/17   Narda BondsNettey, Ralph A, MD  torsemide (DEMADEX) 20 MG tablet Take 1 tablet (20 mg total) by mouth daily. 11/20/17   Berton Mountgbata, Sylvester I, MD  travoprost, benzalkonium,  (TRAVATAN) 0.004 % ophthalmic solution Place 1 drop into both eyes at bedtime.    [provider]    Physical Exam: Vitals:   Jul 07, 2018 2216 Jul 07, 2018 2230 Jul 07, 2018 2330 Jul 07, 2018 2353  BP:  (!) 101/59 118/90   Pulse:  65 85   Resp:  (!) 21 (!) 27   Temp:  (!) 96.7 F (35.9 C)  98.6 F (37 C)  TempSrc:  Temporal  Rectal  SpO2: 91% 92% 97%       Constitutional: No acute respiratory distress, obtunded  Eyes: PERTLA, lids and conjunctivae normal ENMT: Mucous membranes are moist. Posterior pharynx clear of any exudate or lesions.   Neck: normal, supple, no masses, no thyromegaly Respiratory: Coarse rales bilaterally. Tachypnea. No accessory muscle use.  Cardiovascular: Rate ~80 and irregular. 2+ pitting edema of b/l LE's.  Abdomen: No distension, no tenderness, no masses palpated. Bowel sounds normal.  Musculoskeletal: no clubbing / cyanosis. No joint deformity upper and lower extremities.    Skin: no significant rashes, lesions, ulcers. Echhymoses to right scalp. Neurologic: No gross facial asymmetry. Moving all extremities. No verbal response, difficult to rouse.   Psychiatric: Difficult to assess given the clinical scenario.     Labs on Admission: I have personally reviewed following labs and imaging studies  CBC: Recent Labs  Lab 12/23/2017 2224  WBC 6.7  HGB 10.2*  HCT 33.2*  MCV 90.2  PLT 194   Basic Metabolic Panel: Recent Labs  Lab 2017/12/23 2224  NA 138  K 4.2  CL 98*  CO2 24  GLUCOSE 154*  BUN 63*  CREATININE 2.42*  CALCIUM 8.5*   GFR: Estimated Creatinine Clearance: 22.4 mL/min (A) (by C-G formula based on SCr of 2.42 mg/dL (H)). Liver Function Tests: No results for input(s): AST, ALT, ALKPHOS, BILITOT, PROT, ALBUMIN in the last 168 hours. No results for input(s): LIPASE, AMYLASE in the last 168 hours. No results for input(s): AMMONIA in the last 168 hours. Coagulation Profile: No results for input(s): INR, PROTIME in the last 168  hours. Cardiac Enzymes: No results for input(s): CKTOTAL, CKMB, CKMBINDEX, TROPONINI in the last 168 hours. BNP (last 3 results) No results for input(s): PROBNP in the last 8760 hours. HbA1C: No results for input(s): HGBA1C in the last 72 hours. CBG: No results for input(s): GLUCAP in the last 168 hours. Lipid Profile: No results for input(s): CHOL, HDL, LDLCALC, TRIG, CHOLHDL, LDLDIRECT in the last 72 hours. Thyroid Function Tests: No results for input(s): TSH, T4TOTAL, FREET4, T3FREE, THYROIDAB in the last 72 hours. Anemia Panel: No results for input(s): VITAMINB12, FOLATE, FERRITIN, TIBC, IRON, RETICCTPCT in the last 72 hours. Urine analysis:    Component Value Date/Time   COLORURINE YELLOW 10/13/2017 1052   APPEARANCEUR HAZY (A) 10/13/2017 1052   LABSPEC 1.013 10/13/2017 1052   PHURINE 5.0 10/13/2017 1052   GLUCOSEU NEGATIVE 10/13/2017 1052   HGBUR LARGE (A) 10/13/2017 1052   BILIRUBINUR NEGATIVE 10/13/2017 1052   KETONESUR NEGATIVE 10/13/2017 1052   PROTEINUR 30 (A) 10/13/2017 1052   NITRITE NEGATIVE 10/13/2017 1052   LEUKOCYTESUR LARGE (A) 10/13/2017 1052   Sepsis Labs: @LABRCNTIP (procalcitonin:4,lacticidven:4) )No results found for this or any previous visit (from the past 240 hour(s)).   Radiological Exams on Admission: Dg Chest Portable 1 View  Result Date: Dec 23, 2017 CLINICAL DATA:  Status post fall from wheelchair onto floor, with worsening cough. Shortness of breath. EXAM: PORTABLE CHEST 1 VIEW COMPARISON:  Chest radiograph performed 11/11/2017 FINDINGS: A small to moderate left-sided pleural effusion is again noted. Left basilar airspace opacity may reflect pneumonia or asymmetric pulmonary edema. No pneumothorax is seen. The cardiomediastinal silhouette is borderline normal in size. No acute osseous abnormalities are identified. IMPRESSION: Small to moderate left-sided pleural effusion again noted. Left basilar airspace opacity may reflect pneumonia or asymmetric  pulmonary edema. Electronically Signed   By: Roanna Raider M.D.   On: 12-23-17 22:55    EKG: Independently reviewed. Atrial fibrillation, PVC's.   Assessment/Plan  1. Acute on chronic diastolic CHF; pulmonary arterial hypertension   - Presents with increased WOB, increased supplemental O2-requirement - Found to have peripheral edema, suspected edema on CXR  - Echo from 11/11/17 with preserved EF, moderate LVH and moderate LAE  - Diurese with Lasix 40 mg IV q12h, SLIV, fluid-restrict diet, follow daily wts and I/O's, follow daily chem panels during  diuresis, no ACE/ARB d/t renal insufficiency, no beta-blocker due to hx bradycardia  - Continue tadalafil for PAH  2. Acute kidney injury superimposed on CKD stage III  - SCr is 2.42 on admission, up from apparent baseline of ~1.60  - Check urine studies, renally-dose medications  - Follow daily chem panels during diuresis    3. Paroxysmal atrial fibrillation  - In rate-controlled a fib on admission - CHADS-VASc is 93 (age x2, CHF, DM, HTN)  - Recently taken off of Eliquis due to recurrent falls  - Continue ASA 325 mg   4. Type II DM  - A1c was 8.4% in December 2018  - Managed at home with Januvia, held on admission  - Check CBG's and start SSI with Novolog   5. Elevated troponin  - Patient is unable to contribute to the history  - EKG with A fib and PVC's, CXR suggestive of acute CHF, troponin 0.11  - Continue cardiac monitoring, trend troponin, continue ASA    DVT prophylaxis: sq heparin  Code Status: DNR  Family Communication: Daughter updated by phone  Disposition Plan: Admit to telemetry Consults called: None Admission status: Inpatient    Briscoe Deutscher, MD [facility] Pager [phone]  If 7PM-7AM, please contact night-coverage [email or web address] Password [email or web address]  12/07/2017, 1:37 AM

## 2017-12-07 NOTE — Progress Notes (Signed)
[facility] 4E15[facility] - [facility] RN visit.  This is a related and covered GIP admission of 12/07/17 with [facility] diagnosis of Diastolic Heart Failure, per Dr. Anne FuFeldmann. Patient was at home and slipped from wheelchair to the floor. Home health nurse called EMS for lift help only. Patient had worsening SOB/rales and daughter requested patient be brought to the ED. There had been several discussions regarding status between patient's daughter and [facility] RN - she was informing [facility] about "rattling and labored breathing. Caregiver was advised to give breathing treatment. Patient had taken last treatment at 7pm. Last morphine given was at 4pm - caregiver was advised to give him morphine again, along with Ativan and to callback if symptoms worsen. Daughter called back to [facility] again and left message. [facility] RN called her back and was told EMS had been called and she wanted patient transported to hospital instead of waiting for [facility] RN to come to the home.   The initial call to EMS appears to be that caregiver only wanted lift assist from floor to chair. But as other breathing symptoms progressed, they decided to transport patient.  Day of hospital GIP LOS: 1.   Currently the patient is being transitioned to comfort care only. The things that are being addressed specifically are pain management, breathing management, increased secretions, and terminal agitation. Per PMT, patient with pulmonary edema at this time. Patient has declined additionally and appears to be transitioning to end of life.   Patient has been placed on a dilaudid drip for pain management, furosemide and breathing treatments for breathing management/pulling off fluid. Robinul for increased secretions. Ativan also ordered for comfort care.   Goals of care: Patient's daughter advised will continue with DNR and transition patient to full comfort care. If patient stabilized, she is interested in [facility].  This was discussed with Shawna OrleansMelanie, PMT while I was with her in the Emergency Department. Patient currently OOF DNR.  Discharge planning: to be determined. Patient is being admitted to hospital for symptoms management/end of life care, but if stabilization occurs, we will try to get [facility].   Communication to [facility]: I spoke with the daughter, [name], and we discussed transitioning to end of life and what her expectations are at this time. I offered emotional support and told her [facility] will continue to follow patient and keep her updated as to status.  Communication to [facility]: I have talked with Dr. Anne FuFeldmann about this patient and what the plan of care will be moving forward. Dr. Anne FuFeldmann in agreement that comfort care for EOL and potential [facility] if patient stabilizes is a good plan. I have contacted Wynonia HazardBeth Mills, CSW, and advised of same. I will continue to keep her aware of status. Kristine GarbeWendy Slingerland made aware of hospitalization also.   Transfer summary and medication list will be placed on shadow chart.  Dr. Anne FuFeldmann and Dr. Wylene Simmerisovec notified of admission.   We will continue to monitor patient while hospitalized and anticipate d/c needs. If you have any questions or concerns, please feel free to contact [facility].    Thank you,  Adele BarthelAmy Evans, RN, BSN [facility] Hospital Liaison [phone]  All hospital liaisons are now on AMION.

## 2017-12-08 LAB — UREA NITROGEN, URINE: UREA NITROGEN UR: 501 mg/dL

## 2017-12-29 NOTE — Progress Notes (Signed)
Absence of respirations and pulse.   Patient expired 0204.  Pronounced by Suzanne BoronMindy Knick, RN and Murlean Harkarryl Gardner, RN.

## 2017-12-29 NOTE — Progress Notes (Signed)
Wasted 60 ml of Dilaudid I.V. In sink. Wasted with Mindy R.N.

## 2017-12-29 NOTE — Death Summary Note (Signed)
Death Summary  [name] [name]:[id number] DOB: 02-13-1925 DOA: 12/21/17  PCP: Gaspar Garbe, MD  Admit date: 2017-12-21 Date of Death: 12-23-17 Time of Death: 02:04 Notification: Gaspar Garbe, MD notified of death of 12-23-17   History of present illness:  [name] Lofgren Pritchettis a 84 y.o.malewith medical history significant fordementia, diabetes mellitus, hypertension, chronic kidney disease stage III, pulmonary artery hypertension, chronic diastolic CHF, and sick sinus syndrome, now presenting from home for evaluation of increased lethargy, respiratory distress and frequent falls for the past couple of days. Pt was brought into the ED for further evaluation. In the ED, pt was saturating 91% on 4 L/min of supplemental oxygen, CXR notable for a persistent small to moderate left pleural effusion and left base airspace opacity suggestive of pneumonia versus asymmetric edema. BNP is elevated to 792. Pt initially admitted under telemetry.  On 12/07/17 due to significantly worsening resp status, requiring very frequent suctioning and higher O2 palliative and hospice team were consulted in the ED. Agreed that prognosis is very poor with hrs-days. Pt transitioned to comfort care and admitted under palliative team. On December 23, 2017 pt passed away.   Final Diagnoses:  1.   Acute on chronic respiratory failure likely due to diastolic CHF AKI on CKD stage 3 Paroxysmal Afib Type 2 DM Elevated troponin    The results of significant diagnostics from this hospitalization (including imaging, microbiology, ancillary and laboratory) are listed below for reference.    Significant Diagnostic Studies: Dg Chest 2 View  Result Date: 11/11/2017 CLINICAL DATA:  Shortness of breath. EXAM: CHEST  2 VIEW COMPARISON:  11/10/2017 FINDINGS: Stable heart size. Stable moderate left pleural effusion with potential underlying left lower lobe pneumonia. Lung volumes are low. Cannot exclude mild pulmonary  interstitial edema. IMPRESSION: Stable moderate left pleural effusion and potential underlying left lower lobe pneumonia. Cannot exclude mild interstitial edema. Electronically Signed   By: Irish Lack M.D.   On: 11/11/2017 12:19   Dg Chest 2 View  Result Date: 11/10/2017 CLINICAL DATA:  Increased confusion EXAM: CHEST  2 VIEW COMPARISON:  October 13, 2017 FINDINGS: The heart size and mediastinal contours are stable. The heart size is enlarged. The lateral diffuse increased pulmonary interstitium is identified. Consolidation of left lung base with associated left pleural effusion is noted. The visualized skeletal structures are unremarkable. IMPRESSION: Mild interstitial pulmonary edema. Consolidation left lung base, pneumonia is not excluded. Left pleural effusion. Electronically Signed   By: Sherian Rein M.D.   On: 11/10/2017 17:52   Ct Head Wo Contrast  Result Date: 12/07/2017 CLINICAL DATA:  Altered level of consciousness. EXAM: CT HEAD WITHOUT CONTRAST TECHNIQUE: Contiguous axial images were obtained from the base of the skull through the vertex without intravenous contrast. COMPARISON:  CT 10/13/2017 FINDINGS: Brain: Generalized atrophy unchanged. Chronic infarct left parietal lobe unchanged. Negative for acute infarct, hemorrhage, mass Vascular: Negative for acute vascular thrombosis. Atherosclerotic calcification in the carotid and vertebral arteries bilaterally. Skull: Negative Sinuses/Orbits: Negative Other: None IMPRESSION: No acute intracranial abnormality. Chronic left parietal infarct unchanged. Electronically Signed   By: Marlan Palau M.D.   On: 12/07/2017 11:21   Dg Chest Portable 1 View  Result Date: 2017/12/21 CLINICAL DATA:  Status post fall from wheelchair onto floor, with worsening cough. Shortness of breath. EXAM: PORTABLE CHEST 1 VIEW COMPARISON:  Chest radiograph performed 11/11/2017 FINDINGS: A small to moderate left-sided pleural effusion is again noted. Left basilar  airspace opacity may reflect pneumonia or asymmetric pulmonary edema. No pneumothorax is  seen. The cardiomediastinal silhouette is borderline normal in size. No acute osseous abnormalities are identified. IMPRESSION: Small to moderate left-sided pleural effusion again noted. Left basilar airspace opacity may reflect pneumonia or asymmetric pulmonary edema. Electronically Signed   By: Roanna RaiderJeffery  Chang M.D.   On: 12/25/2017 22:55   Dg Knee Complete 4 Views Left  Result Date: 12/03/2017 CLINICAL DATA:  Increased swelling and inability to bend the knee EXAM: LEFT KNEE - COMPLETE 4+ VIEW COMPARISON:  06/21/2017 FINDINGS: No fracture or dislocation is evident. Patient is status post left knee replacement. Partially visualized intramedullary rod and fixating screw in the distal femur. There is a fracture through the mid aspect of the distal fixating screw with lucency around the head of the screw. There is been slight distal migration of the femoral rod with the tip appearing slightly more inferior compared to the fluoroscopic images from 06/21/2017. There is diffuse soft tissue swelling. There is a small to moderate joint effusion. Vascular calcifications. IMPRESSION: 1. Small to moderate knee effusion with diffuse soft tissue swelling. No fracture seen 2. Interval fracture through the distal femoral fixating screw. Distal tip of the fixating femoral rod appears more distally positioned within the femur compared to intraoperative spot views from August. Electronically Signed   By: Jasmine PangKim  Fujinaga M.D.   On: 12/03/2017 20:01    Microbiology: No results found for this or any previous visit (from the past 240 hour(s)).   Labs: Basic Metabolic Panel: Recent Labs  Lab 12/02/2017 2224  NA 138  K 4.2  CL 98*  CO2 24  GLUCOSE 154*  BUN 63*  CREATININE 2.42*  CALCIUM 8.5*   Liver Function Tests: No results for input(s): AST, ALT, ALKPHOS, BILITOT, PROT, ALBUMIN in the last 168 hours. No results for input(s):  LIPASE, AMYLASE in the last 168 hours. No results for input(s): AMMONIA in the last 168 hours. CBC: Recent Labs  Lab 12/27/2017 2224  WBC 6.7  HGB 10.2*  HCT 33.2*  MCV 90.2  PLT 194   Cardiac Enzymes: Recent Labs  Lab 12/07/17 0207 12/07/17 0751  TROPONINI 0.09* 0.09*   D-Dimer No results for input(s): DDIMER in the last 72 hours. BNP: Invalid input(s): POCBNP CBG: Recent Labs  Lab 12/07/17 0227  GLUCAP 138*   Anemia work up No results for input(s): VITAMINB12, FOLATE, FERRITIN, TIBC, IRON, RETICCTPCT in the last 72 hours. Urinalysis    Component Value Date/Time   COLORURINE YELLOW 10/13/2017 1052   APPEARANCEUR HAZY (A) 10/13/2017 1052   LABSPEC 1.013 10/13/2017 1052   PHURINE 5.0 10/13/2017 1052   GLUCOSEU NEGATIVE 10/13/2017 1052   HGBUR LARGE (A) 10/13/2017 1052   BILIRUBINUR NEGATIVE 10/13/2017 1052   KETONESUR NEGATIVE 10/13/2017 1052   PROTEINUR 30 (A) 10/13/2017 1052   NITRITE NEGATIVE 10/13/2017 1052   LEUKOCYTESUR LARGE (A) 10/13/2017 1052   Sepsis Labs Invalid input(s): PROCALCITONIN,  WBC,  LACTICIDVEN     SIGNED:  Briant CedarNkeiruka J Cortlin Marano, MD  [facility] 12/09/2017, 10:26 PM Pager   If 7PM-7AM, please contact night-coverage [email or web address] Password [email or web address]

## 2017-12-29 DEATH — deceased

## 2019-05-25 IMAGING — DX DG HIP (WITH OR WITHOUT PELVIS) 1V PORT*L*
7 series · 7 of 7 positions shown · non-contrast
Comparison: None.

CLINICAL DATA: Postop left intertrochanteric fracture

EXAM:
DG HIP (WITH OR WITHOUT PELVIS) 1V PORT LEFT

[pelvis ap (1 of 2)]
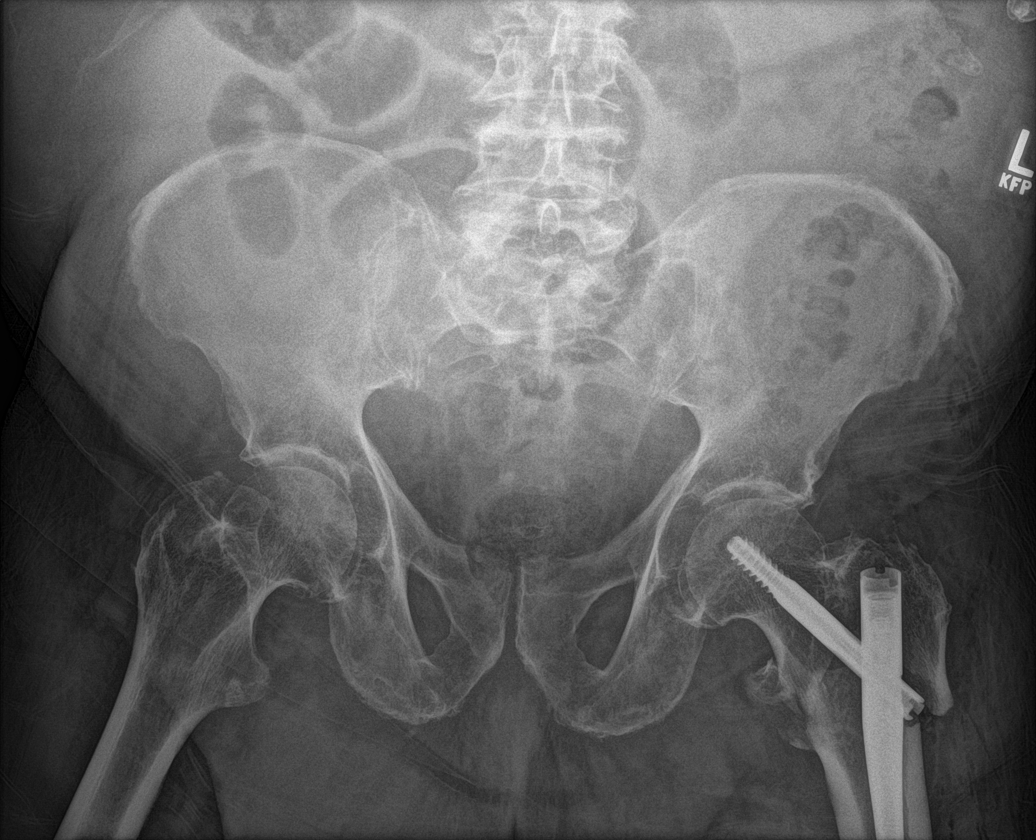

[pelvis ap (2 of 2)]
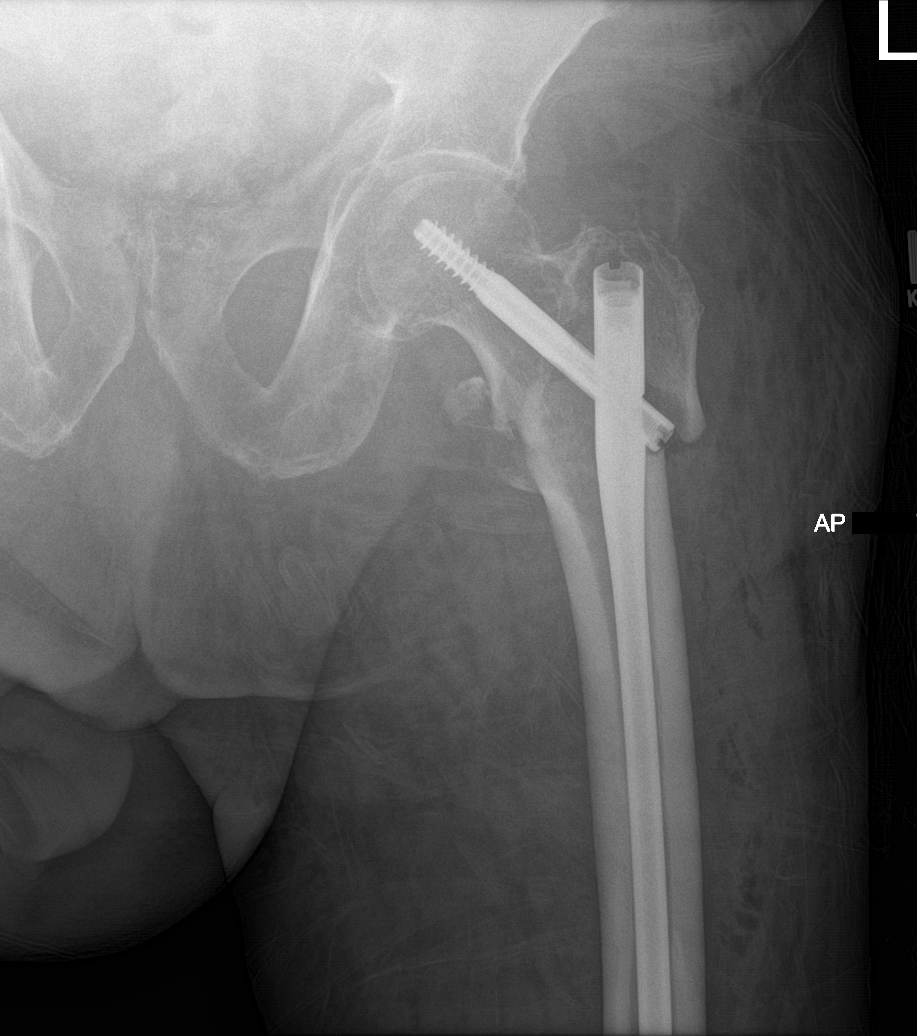

[hip lat (1 of 3)]
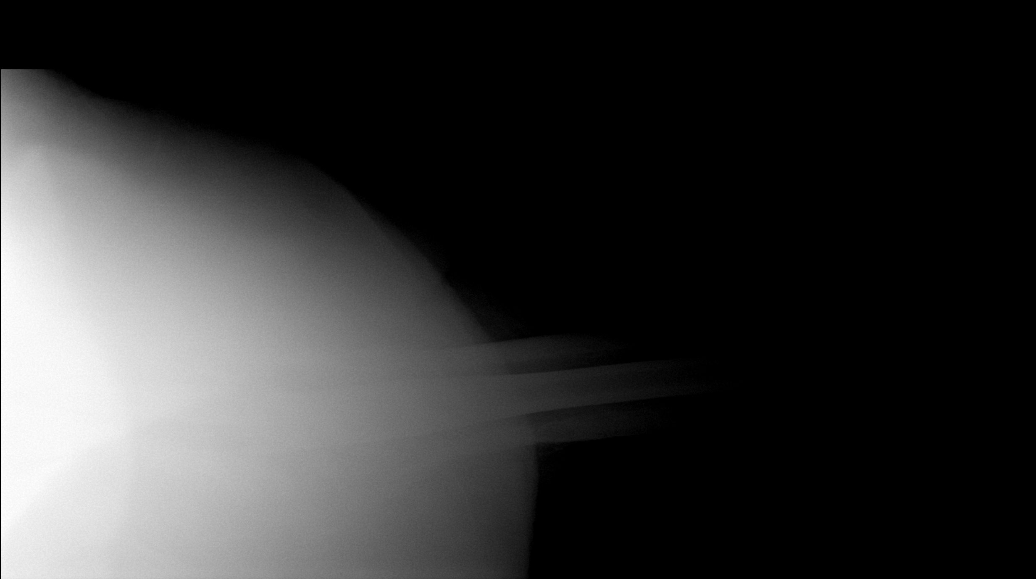

[hip lat (2 of 3)]
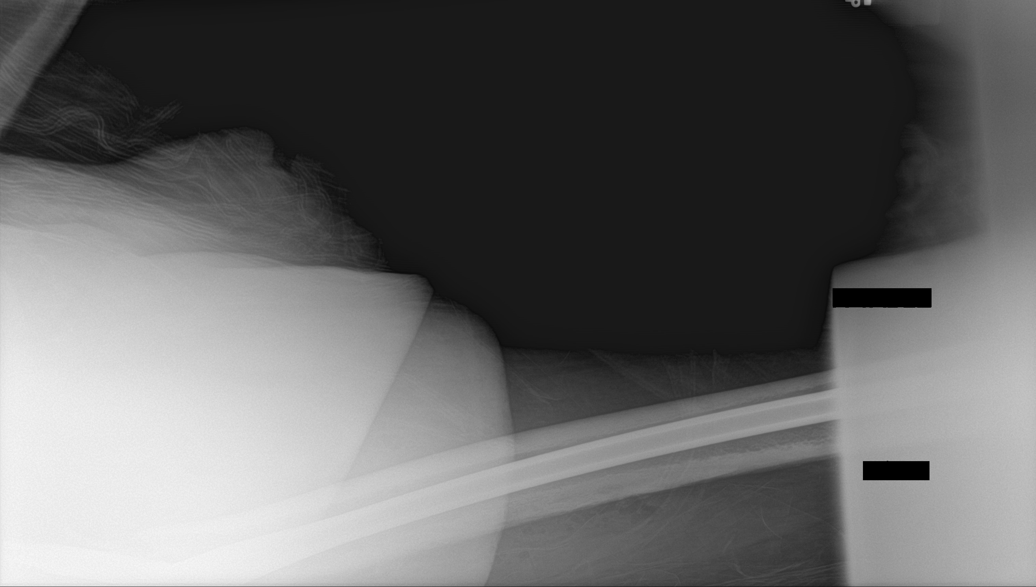

[hip ap]
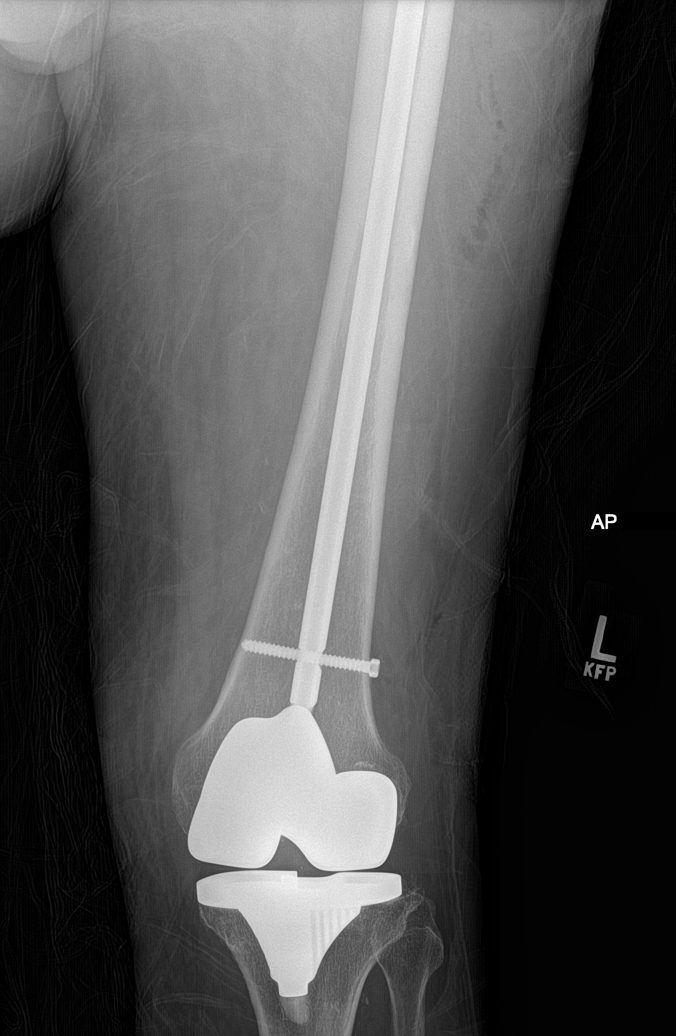

[hip lat (3 of 3)]
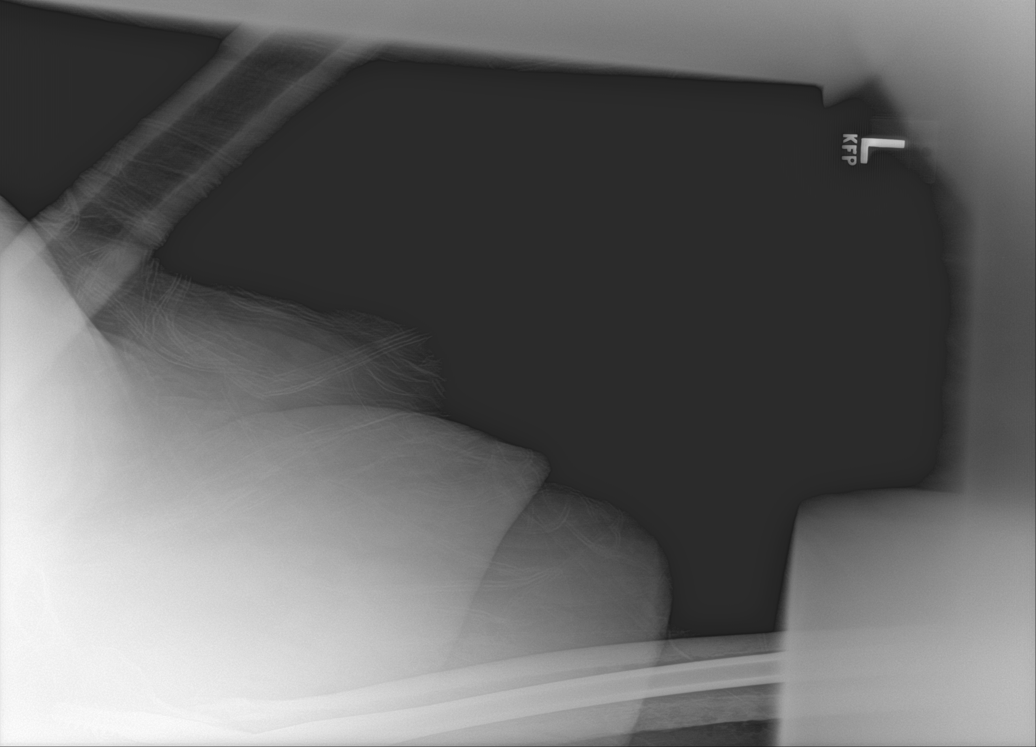

[knee lat]
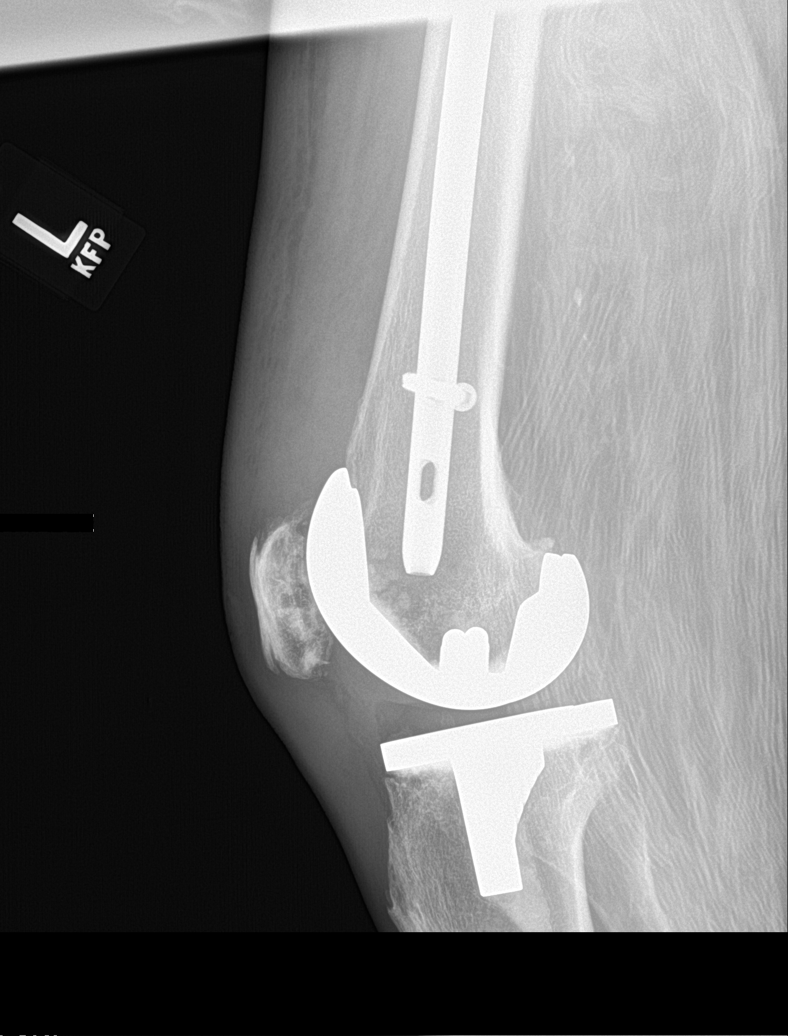

[7 of 7 positions shown; findings below may reference images not displayed]

FINDINGS: Internal fixation changes across the left femoral intertrochanteric
fracture. There remains mild displacement of the greater and lesser
trochanters. No subluxation or dislocation. No hardware complicating
feature.
IMPRESSION: Internal fixation across the left femoral intertrochanteric fracture
with mild continued displacement as above.
